# Patient Record
Sex: Female | Born: 1940
Health system: Southern US, Community
[De-identification: ages and names within clinical notes are randomized; demographics above are authoritative.]

## PROBLEM LIST (undated history)

## (undated) DIAGNOSIS — I10 Essential (primary) hypertension: Secondary | ICD-10-CM

## (undated) DIAGNOSIS — B019 Varicella without complication: Secondary | ICD-10-CM

## (undated) DIAGNOSIS — E7800 Pure hypercholesterolemia, unspecified: Secondary | ICD-10-CM

## (undated) DIAGNOSIS — Z8719 Personal history of other diseases of the digestive system: Secondary | ICD-10-CM

## (undated) DIAGNOSIS — K219 Gastro-esophageal reflux disease without esophagitis: Secondary | ICD-10-CM

## (undated) DIAGNOSIS — J3489 Other specified disorders of nose and nasal sinuses: Secondary | ICD-10-CM

## (undated) DIAGNOSIS — E119 Type 2 diabetes mellitus without complications: Secondary | ICD-10-CM

## (undated) DIAGNOSIS — E538 Deficiency of other specified B group vitamins: Secondary | ICD-10-CM

## (undated) HISTORY — DX: Type 2 diabetes mellitus without complications: E11.9

## (undated) HISTORY — DX: Personal history of other diseases of the digestive system: Z87.19

## (undated) HISTORY — DX: Pure hypercholesterolemia, unspecified: E78.00

## (undated) HISTORY — PX: TONSILLECTOMY: SUR1361

## (undated) HISTORY — DX: Varicella without complication: B01.9

## (undated) HISTORY — DX: Deficiency of other specified B group vitamins: E53.8

## (undated) HISTORY — DX: Gastro-esophageal reflux disease without esophagitis: K21.9

## (undated) HISTORY — DX: Essential (primary) hypertension: I10

---

## 1982-07-27 HISTORY — PX: ABDOMINAL HYSTERECTOMY: SHX81

## 1984-07-27 HISTORY — PX: BACK SURGERY: SHX140

## 1989-07-27 HISTORY — PX: CHOLECYSTECTOMY: SHX55

## 1990-07-27 HISTORY — PX: CHOLECYSTECTOMY: SHX55

## 2004-09-15 ENCOUNTER — Ambulatory Visit: Payer: Self-pay | Admitting: Internal Medicine

## 2004-12-08 ENCOUNTER — Ambulatory Visit: Payer: Self-pay

## 2004-12-15 ENCOUNTER — Ambulatory Visit: Payer: Self-pay

## 2005-02-17 ENCOUNTER — Emergency Department: Payer: Self-pay | Admitting: Emergency Medicine

## 2005-08-31 ENCOUNTER — Ambulatory Visit: Payer: Self-pay | Admitting: Gastroenterology

## 2005-09-21 ENCOUNTER — Ambulatory Visit: Payer: Self-pay | Admitting: Gastroenterology

## 2006-04-15 ENCOUNTER — Ambulatory Visit: Payer: Self-pay | Admitting: Internal Medicine

## 2006-11-21 ENCOUNTER — Other Ambulatory Visit: Payer: Self-pay

## 2006-11-21 ENCOUNTER — Emergency Department: Payer: Self-pay | Admitting: Emergency Medicine

## 2009-01-15 ENCOUNTER — Ambulatory Visit: Payer: Self-pay | Admitting: Internal Medicine

## 2010-02-11 ENCOUNTER — Ambulatory Visit: Payer: Self-pay | Admitting: Internal Medicine

## 2010-02-17 ENCOUNTER — Ambulatory Visit: Payer: Self-pay | Admitting: Gastroenterology

## 2010-02-19 LAB — PATHOLOGY REPORT

## 2011-01-02 LAB — HM DEXA SCAN

## 2011-02-17 ENCOUNTER — Ambulatory Visit: Payer: Self-pay | Admitting: Internal Medicine

## 2011-02-19 ENCOUNTER — Ambulatory Visit: Payer: Self-pay | Admitting: Internal Medicine

## 2011-03-19 ENCOUNTER — Ambulatory Visit: Payer: Self-pay | Admitting: Gastroenterology

## 2011-03-19 LAB — HM COLONOSCOPY

## 2011-08-18 DIAGNOSIS — E7800 Pure hypercholesterolemia, unspecified: Secondary | ICD-10-CM | POA: Diagnosis not present

## 2011-08-18 DIAGNOSIS — E119 Type 2 diabetes mellitus without complications: Secondary | ICD-10-CM | POA: Diagnosis not present

## 2011-08-18 DIAGNOSIS — I10 Essential (primary) hypertension: Secondary | ICD-10-CM | POA: Diagnosis not present

## 2011-08-18 DIAGNOSIS — Z23 Encounter for immunization: Secondary | ICD-10-CM | POA: Diagnosis not present

## 2011-08-25 DIAGNOSIS — J209 Acute bronchitis, unspecified: Secondary | ICD-10-CM | POA: Diagnosis not present

## 2011-08-25 DIAGNOSIS — J029 Acute pharyngitis, unspecified: Secondary | ICD-10-CM | POA: Diagnosis not present

## 2011-09-04 ENCOUNTER — Ambulatory Visit: Payer: Self-pay | Admitting: Internal Medicine

## 2011-09-04 DIAGNOSIS — R928 Other abnormal and inconclusive findings on diagnostic imaging of breast: Secondary | ICD-10-CM | POA: Diagnosis not present

## 2011-09-04 DIAGNOSIS — N63 Unspecified lump in unspecified breast: Secondary | ICD-10-CM | POA: Diagnosis not present

## 2011-09-08 DIAGNOSIS — H9209 Otalgia, unspecified ear: Secondary | ICD-10-CM | POA: Diagnosis not present

## 2011-09-08 DIAGNOSIS — R221 Localized swelling, mass and lump, neck: Secondary | ICD-10-CM | POA: Diagnosis not present

## 2011-09-08 DIAGNOSIS — R220 Localized swelling, mass and lump, head: Secondary | ICD-10-CM | POA: Diagnosis not present

## 2011-09-22 DIAGNOSIS — M26609 Unspecified temporomandibular joint disorder, unspecified side: Secondary | ICD-10-CM | POA: Diagnosis not present

## 2011-09-22 DIAGNOSIS — R220 Localized swelling, mass and lump, head: Secondary | ICD-10-CM | POA: Diagnosis not present

## 2011-09-22 DIAGNOSIS — R928 Other abnormal and inconclusive findings on diagnostic imaging of breast: Secondary | ICD-10-CM | POA: Diagnosis not present

## 2011-09-29 ENCOUNTER — Ambulatory Visit: Payer: Self-pay | Admitting: Otolaryngology

## 2011-09-29 DIAGNOSIS — R599 Enlarged lymph nodes, unspecified: Secondary | ICD-10-CM | POA: Diagnosis not present

## 2011-09-29 DIAGNOSIS — R220 Localized swelling, mass and lump, head: Secondary | ICD-10-CM | POA: Diagnosis not present

## 2011-09-29 LAB — CREATININE, SERUM
Creatinine: 0.83 mg/dL (ref 0.60–1.30)
EGFR (African American): >60
EGFR (Non-African Amer.): >60

## 2011-10-01 ENCOUNTER — Other Ambulatory Visit: Payer: Self-pay | Admitting: Otolaryngology

## 2011-10-01 DIAGNOSIS — Z01818 Encounter for other preprocedural examination: Secondary | ICD-10-CM | POA: Diagnosis not present

## 2011-10-01 LAB — CREATININE, SERUM
EGFR (African American): >60
EGFR (Non-African Amer.): >60

## 2011-11-03 DIAGNOSIS — R220 Localized swelling, mass and lump, head: Secondary | ICD-10-CM | POA: Diagnosis not present

## 2011-12-29 DIAGNOSIS — K219 Gastro-esophageal reflux disease without esophagitis: Secondary | ICD-10-CM | POA: Diagnosis not present

## 2012-01-05 DIAGNOSIS — R221 Localized swelling, mass and lump, neck: Secondary | ICD-10-CM | POA: Diagnosis not present

## 2012-01-05 DIAGNOSIS — H6120 Impacted cerumen, unspecified ear: Secondary | ICD-10-CM | POA: Diagnosis not present

## 2012-01-05 DIAGNOSIS — H3530 Unspecified macular degeneration: Secondary | ICD-10-CM | POA: Diagnosis not present

## 2012-01-12 DIAGNOSIS — E119 Type 2 diabetes mellitus without complications: Secondary | ICD-10-CM | POA: Diagnosis not present

## 2012-01-12 DIAGNOSIS — E7800 Pure hypercholesterolemia, unspecified: Secondary | ICD-10-CM | POA: Diagnosis not present

## 2012-01-12 DIAGNOSIS — R5383 Other fatigue: Secondary | ICD-10-CM | POA: Diagnosis not present

## 2012-01-19 DIAGNOSIS — I10 Essential (primary) hypertension: Secondary | ICD-10-CM | POA: Diagnosis not present

## 2012-01-19 DIAGNOSIS — M79609 Pain in unspecified limb: Secondary | ICD-10-CM | POA: Diagnosis not present

## 2012-01-19 DIAGNOSIS — E7800 Pure hypercholesterolemia, unspecified: Secondary | ICD-10-CM | POA: Diagnosis not present

## 2012-01-19 DIAGNOSIS — N9489 Other specified conditions associated with female genital organs and menstrual cycle: Secondary | ICD-10-CM | POA: Diagnosis not present

## 2012-01-19 DIAGNOSIS — M542 Cervicalgia: Secondary | ICD-10-CM | POA: Diagnosis not present

## 2012-01-19 DIAGNOSIS — E119 Type 2 diabetes mellitus without complications: Secondary | ICD-10-CM | POA: Diagnosis not present

## 2012-01-19 DIAGNOSIS — S2249XA Multiple fractures of ribs, unspecified side, initial encounter for closed fracture: Secondary | ICD-10-CM | POA: Diagnosis not present

## 2012-02-23 ENCOUNTER — Ambulatory Visit: Payer: Self-pay | Admitting: Internal Medicine

## 2012-02-23 DIAGNOSIS — N6489 Other specified disorders of breast: Secondary | ICD-10-CM | POA: Diagnosis not present

## 2012-02-23 DIAGNOSIS — E782 Mixed hyperlipidemia: Secondary | ICD-10-CM | POA: Diagnosis not present

## 2012-02-23 DIAGNOSIS — Z1231 Encounter for screening mammogram for malignant neoplasm of breast: Secondary | ICD-10-CM | POA: Diagnosis not present

## 2012-02-23 DIAGNOSIS — E119 Type 2 diabetes mellitus without complications: Secondary | ICD-10-CM | POA: Diagnosis not present

## 2012-02-23 DIAGNOSIS — I10 Essential (primary) hypertension: Secondary | ICD-10-CM | POA: Diagnosis not present

## 2012-02-23 DIAGNOSIS — R922 Inconclusive mammogram: Secondary | ICD-10-CM | POA: Diagnosis not present

## 2012-02-23 DIAGNOSIS — I059 Rheumatic mitral valve disease, unspecified: Secondary | ICD-10-CM | POA: Diagnosis not present

## 2012-02-26 DIAGNOSIS — Z1211 Encounter for screening for malignant neoplasm of colon: Secondary | ICD-10-CM | POA: Diagnosis not present

## 2012-03-22 DIAGNOSIS — M542 Cervicalgia: Secondary | ICD-10-CM | POA: Diagnosis not present

## 2012-03-22 DIAGNOSIS — M25519 Pain in unspecified shoulder: Secondary | ICD-10-CM | POA: Diagnosis not present

## 2012-04-03 ENCOUNTER — Inpatient Hospital Stay: Payer: Self-pay | Admitting: Internal Medicine

## 2012-04-03 DIAGNOSIS — I10 Essential (primary) hypertension: Secondary | ICD-10-CM | POA: Diagnosis not present

## 2012-04-03 DIAGNOSIS — Z87891 Personal history of nicotine dependence: Secondary | ICD-10-CM | POA: Diagnosis not present

## 2012-04-03 DIAGNOSIS — Z881 Allergy status to other antibiotic agents status: Secondary | ICD-10-CM | POA: Diagnosis not present

## 2012-04-03 DIAGNOSIS — K219 Gastro-esophageal reflux disease without esophagitis: Secondary | ICD-10-CM | POA: Diagnosis not present

## 2012-04-03 DIAGNOSIS — I2510 Atherosclerotic heart disease of native coronary artery without angina pectoris: Secondary | ICD-10-CM | POA: Diagnosis present

## 2012-04-03 DIAGNOSIS — I200 Unstable angina: Secondary | ICD-10-CM | POA: Diagnosis not present

## 2012-04-03 DIAGNOSIS — Z823 Family history of stroke: Secondary | ICD-10-CM | POA: Diagnosis not present

## 2012-04-03 DIAGNOSIS — IMO0002 Reserved for concepts with insufficient information to code with codable children: Secondary | ICD-10-CM | POA: Diagnosis present

## 2012-04-03 DIAGNOSIS — R079 Chest pain, unspecified: Secondary | ICD-10-CM | POA: Diagnosis not present

## 2012-04-03 DIAGNOSIS — E785 Hyperlipidemia, unspecified: Secondary | ICD-10-CM | POA: Diagnosis not present

## 2012-04-03 DIAGNOSIS — E538 Deficiency of other specified B group vitamins: Secondary | ICD-10-CM | POA: Diagnosis present

## 2012-04-03 DIAGNOSIS — Z9089 Acquired absence of other organs: Secondary | ICD-10-CM | POA: Diagnosis not present

## 2012-04-03 DIAGNOSIS — Z9079 Acquired absence of other genital organ(s): Secondary | ICD-10-CM | POA: Diagnosis not present

## 2012-04-03 DIAGNOSIS — I209 Angina pectoris, unspecified: Secondary | ICD-10-CM | POA: Diagnosis not present

## 2012-04-03 DIAGNOSIS — R072 Precordial pain: Secondary | ICD-10-CM | POA: Diagnosis not present

## 2012-04-03 DIAGNOSIS — R0789 Other chest pain: Secondary | ICD-10-CM | POA: Diagnosis not present

## 2012-04-03 DIAGNOSIS — E119 Type 2 diabetes mellitus without complications: Secondary | ICD-10-CM | POA: Diagnosis not present

## 2012-04-03 DIAGNOSIS — Z90710 Acquired absence of both cervix and uterus: Secondary | ICD-10-CM | POA: Diagnosis not present

## 2012-04-03 LAB — CK TOTAL AND CKMB (NOT AT ARMC)
CK, Total: 126 U/L (ref 21–215)
CK, Total: 181 U/L (ref 21–215)
CK, Total: 93 U/L (ref 21–215)
CK-MB: 0.8 ng/mL (ref 0.5–3.6)
CK-MB: 0.8 ng/mL (ref 0.5–3.6)

## 2012-04-03 LAB — TROPONIN I
Troponin-I: <0.02 ng/mL
Troponin-I: <0.02 ng/mL
Troponin-I: <0.02 ng/mL

## 2012-04-03 LAB — COMPREHENSIVE METABOLIC PANEL
Alkaline Phosphatase: 92 U/L (ref 50–136)
Bilirubin,Total: 0.3 mg/dL (ref 0.2–1.0)
Calcium, Total: 8.9 mg/dL (ref 8.5–10.1)
Co2: 33 mmol/L — ABNORMAL HIGH (ref 21–32)
EGFR (Non-African Amer.): >60
Glucose: 136 mg/dL — ABNORMAL HIGH (ref 65–99)
Osmolality: 287 (ref 275–301)
SGOT(AST): 23 U/L (ref 15–37)
SGPT (ALT): 23 U/L (ref 12–78)
Sodium: 142 mmol/L (ref 136–145)

## 2012-04-03 LAB — CBC
HCT: 39.4 % (ref 35.0–47.0)
HGB: 12.8 g/dL (ref 12.0–16.0)
MCHC: 32.5 g/dL (ref 32.0–36.0)
Platelet: 340 10*3/uL (ref 150–440)
RBC: 4.36 10*6/uL (ref 3.80–5.20)
WBC: 11.2 10*3/uL — ABNORMAL HIGH (ref 3.6–11.0)

## 2012-04-03 LAB — PROTIME-INR: Prothrombin Time: 12.6 secs (ref 11.5–14.7)

## 2012-04-03 LAB — LIPID PANEL
HDL Cholesterol: 52 mg/dL (ref 40–60)
Triglycerides: 103 mg/dL (ref 0–200)

## 2012-04-03 LAB — APTT: Activated PTT: 28.3 secs (ref 23.6–35.9)

## 2012-04-03 LAB — HEMOGLOBIN A1C: Hemoglobin A1C: 6.5 % — ABNORMAL HIGH (ref 4.2–6.3)

## 2012-04-26 DIAGNOSIS — Z23 Encounter for immunization: Secondary | ICD-10-CM | POA: Diagnosis not present

## 2012-05-31 DIAGNOSIS — Z23 Encounter for immunization: Secondary | ICD-10-CM | POA: Diagnosis not present

## 2012-06-07 ENCOUNTER — Encounter: Payer: Self-pay | Admitting: Internal Medicine

## 2012-06-07 ENCOUNTER — Ambulatory Visit (INDEPENDENT_AMBULATORY_CARE_PROVIDER_SITE_OTHER): Payer: Medicare Other | Admitting: Internal Medicine

## 2012-06-07 VITALS — BP 138/88 | HR 94 | Temp 98.4°F | Ht 63.5 in | Wt 210.5 lb

## 2012-06-07 DIAGNOSIS — I10 Essential (primary) hypertension: Secondary | ICD-10-CM | POA: Diagnosis not present

## 2012-06-07 DIAGNOSIS — R51 Headache: Secondary | ICD-10-CM

## 2012-06-07 DIAGNOSIS — E119 Type 2 diabetes mellitus without complications: Secondary | ICD-10-CM

## 2012-06-07 DIAGNOSIS — E7800 Pure hypercholesterolemia, unspecified: Secondary | ICD-10-CM

## 2012-06-07 DIAGNOSIS — R5383 Other fatigue: Secondary | ICD-10-CM

## 2012-06-07 DIAGNOSIS — R928 Other abnormal and inconclusive findings on diagnostic imaging of breast: Secondary | ICD-10-CM

## 2012-06-07 DIAGNOSIS — K219 Gastro-esophageal reflux disease without esophagitis: Secondary | ICD-10-CM

## 2012-06-07 DIAGNOSIS — R5381 Other malaise: Secondary | ICD-10-CM

## 2012-06-07 LAB — BASIC METABOLIC PANEL
BUN: 21 mg/dL (ref 6–23)
CO2: 31 mEq/L (ref 19–32)
Calcium: 9 mg/dL (ref 8.4–10.5)
Glucose, Bld: 110 mg/dL — ABNORMAL HIGH (ref 70–99)
Potassium: 4.7 mEq/L (ref 3.5–5.1)
Sodium: 139 mEq/L (ref 135–145)

## 2012-06-07 LAB — CBC WITH DIFFERENTIAL/PLATELET
Basophils Relative: 0.5 % (ref 0.0–3.0)
Eosinophils Relative: 1.4 % (ref 0.0–5.0)
Hemoglobin: 13.3 g/dL (ref 12.0–15.0)
Lymphocytes Relative: 34.8 % (ref 12.0–46.0)
Monocytes Relative: 6.2 % (ref 3.0–12.0)
Neutrophils Relative %: 57.1 % (ref 43.0–77.0)
RBC: 4.51 Mil/uL (ref 3.87–5.11)

## 2012-06-07 LAB — SEDIMENTATION RATE: Sed Rate: 10 mm/hr (ref 0–22)

## 2012-06-07 MED ORDER — AZITHROMYCIN 250 MG PO TABS: ORAL_TABLET | ORAL | Status: DC

## 2012-06-07 MED ORDER — FLUTICASONE PROPIONATE 50 MCG/ACT NA SUSP: 2.0000 | Freq: Every day | NASAL | Status: DC

## 2012-06-07 NOTE — Patient Instructions (Addendum)
It was nice seeing you today.  I am sorry you have been having headaches.  I am going to give you a Zpak to take as directed.  Nasal flushes 2-3x/day.  Flonase nasal spray - 2 sprays each nostril in the evening.  Let me know if you continue to have problems.  We will notify you of your labs once they are available.

## 2012-06-08 ENCOUNTER — Other Ambulatory Visit: Payer: Self-pay | Admitting: Internal Medicine

## 2012-06-08 DIAGNOSIS — E7800 Pure hypercholesterolemia, unspecified: Secondary | ICD-10-CM

## 2012-06-08 DIAGNOSIS — E119 Type 2 diabetes mellitus without complications: Secondary | ICD-10-CM

## 2012-06-08 DIAGNOSIS — R5383 Other fatigue: Secondary | ICD-10-CM

## 2012-06-13 ENCOUNTER — Telehealth: Payer: Self-pay | Admitting: *Deleted

## 2012-06-14 ENCOUNTER — Other Ambulatory Visit (INDEPENDENT_AMBULATORY_CARE_PROVIDER_SITE_OTHER): Payer: Medicare Other

## 2012-06-14 DIAGNOSIS — R5381 Other malaise: Secondary | ICD-10-CM | POA: Diagnosis not present

## 2012-06-14 DIAGNOSIS — E119 Type 2 diabetes mellitus without complications: Secondary | ICD-10-CM | POA: Diagnosis not present

## 2012-06-14 DIAGNOSIS — R5383 Other fatigue: Secondary | ICD-10-CM | POA: Diagnosis not present

## 2012-06-14 DIAGNOSIS — E7800 Pure hypercholesterolemia, unspecified: Secondary | ICD-10-CM | POA: Diagnosis not present

## 2012-06-14 LAB — TSH: TSH: 2.43 u[IU]/mL (ref 0.35–5.50)

## 2012-06-14 LAB — LIPID PANEL
HDL: 49.1 mg/dL (ref >39.00)
Total CHOL/HDL Ratio: 5

## 2012-06-14 LAB — LDL CHOLESTEROL, DIRECT: Direct LDL: 169.5 mg/dL

## 2012-06-15 LAB — BASIC METABOLIC PANEL
BUN: 23 mg/dL (ref 6–23)
Calcium: 8.6 mg/dL (ref 8.4–10.5)
Chloride: 102 mEq/L (ref 96–112)
Creatinine, Ser: 0.8 mg/dL (ref 0.4–1.2)
GFR: 71.8 mL/min (ref >60.00)
Glucose, Bld: 118 mg/dL — ABNORMAL HIGH (ref 70–99)
Potassium: 4.5 mEq/L (ref 3.5–5.1)

## 2012-06-16 NOTE — Telephone Encounter (Signed)
Opened in error

## 2012-06-19 ENCOUNTER — Encounter: Payer: Self-pay | Admitting: Internal Medicine

## 2012-06-19 DIAGNOSIS — I10 Essential (primary) hypertension: Secondary | ICD-10-CM | POA: Insufficient documentation

## 2012-06-19 DIAGNOSIS — K219 Gastro-esophageal reflux disease without esophagitis: Secondary | ICD-10-CM | POA: Insufficient documentation

## 2012-06-19 NOTE — Assessment & Plan Note (Signed)
Unable to tolerate statin medications.  Low cholesterol diet and exercise.  Check lipid panel with next fasting labs.

## 2012-06-19 NOTE — Assessment & Plan Note (Signed)
Low carb diet and exercise.  Continue metformin.  Check met b and a1c with next fasting labs.  Keep up to date with eye checks.

## 2012-06-19 NOTE — Assessment & Plan Note (Signed)
Controlled on current regimen.  Follow.  

## 2012-06-19 NOTE — Assessment & Plan Note (Signed)
Blood pressure has been under good control. Same meds.  Follow.   

## 2012-06-19 NOTE — Progress Notes (Signed)
Subjective:    Patient ID: Misty Jones, female    DOB: 04-07-1941, 71 y.o.   MRN: [id number]  HPI 71 year old female with past history of diabetes, hypertension and hypercholesterolemia who comes in today for a scheduled follow up.  She has been noticing some problems with reoccurring fever blisters.  She also has noticed her hands and feet - feel like they are going to sleep.   This mostly occurs when she is sitting.  Right > left.   Notices her hands - when she is sitting and holding a book.  Sugars are doing well.  Am sugars - 100-120 and pm sugars 125-130s.  Was hospitalized 9/13 for elevated blood pressure and chest pain.  Was kept over night.  Stress test negative.  Saw Dr Gwen Pounds.  She has also had some issues with her sinuses.  Saw Dr Chestine Spore.  Using saline nasal rinses.  Having some issues now.  Headache feels similar to her sinus headaches.  Some congestion.  Doing better.    Past Medical History  Diagnosis Date  . Diabetes mellitus   . Hypertension   . Hypercholesterolemia   . GERD (gastroesophageal reflux disease)   . B12 deficiency   . Chicken pox   . History of diverticulitis of colon     Review of Systems Patient denies any lightheadedness or dizziness currently.  Sinus symptoms as outlined.  Some headache.  No chest pain, tightness or palpitations currently.   No increased shortness of breath, cough or congestion.  No acid reflux.  Controlled.  No nausea or vomiting.  No abdominal pain or cramping.  No bowel change, such as diarrhea, constipation, BRBPR or melana.  No urine change.        Objective:   Physical Exam Filed Vitals:   06/07/12 1336  BP: 138/88  Pulse: 94  Temp: 98.4 F (25.71 C)   71 year old female in no acute distress.   HEENT:  Nares - clear except slightly erythematous turbinates.  OP- without lesions or erythema.  TMs visualized - without erythema.  Minimal tenderness to palpation over the sinuses.   NECK:  Supple, nontender.  No audible bruit.     HEART:  Appears to be regular. LUNGS:  Without crackles or wheezing audible.  Respirations even and unlabored.   RADIAL PULSE:  Equal bilaterally.  ABDOMEN:  Soft, nontender.  No audible abdominal bruit.   EXTREMITIES:  No increased edema to be present.                     Assessment & Plan:  HEADACHE.  She feels this is consistent with her sinus headaches.  Will treat with zpak as directed.  Saline nasal flushes and Flonase as directed.  Will check ESR.  Notify me if headache does not resolve completely.    CARDIOVASCULAR.  Currently asymptomatic.  Follow.    HAND NUMBNESS. Worse with holding a book.   Appears to be c/w possible carpal tunnel.  Wrist splint.  Follow.   ABNORMAL MAMMOGRAM.  Mammogram 02/17/11 rec follow up views.  These were done 02/19/11 and revealed a persistent density in the right upper outer quadrant.  Saw Dr Katrinka Blazing.  Had follow up right breast mammo 2/13.  Stable.  Felt to be benign.  Felt biopsy not warranted.   Recommended a follow up bilateral mammogram in 7/13.  Need results of 7/13 mammo.    HEALTH MAINTENANCE.  Physical 01/19/12.  Mammogram as outlined above.  Colonoscopy  03/19/11 revealed diverticulosis otherwise normal.  Recommended repeat in five years.  Bone density 01/13/11 - osteopenia (improved).

## 2012-07-05 DIAGNOSIS — R220 Localized swelling, mass and lump, head: Secondary | ICD-10-CM | POA: Diagnosis not present

## 2012-07-05 DIAGNOSIS — R221 Localized swelling, mass and lump, neck: Secondary | ICD-10-CM | POA: Diagnosis not present

## 2012-07-26 ENCOUNTER — Telehealth: Payer: Self-pay | Admitting: Internal Medicine

## 2012-07-26 NOTE — Telephone Encounter (Signed)
Pt is calling and saying that Delford Field says she needs an order put in for her 6 month recheck of her mammo.

## 2012-07-27 NOTE — Telephone Encounter (Signed)
You have left her a message to schedule a follow up appt for a repeat breast exam.  I will order follow up mammo at that time.  Let me know if she cannot come to appt.  Thanks.

## 2012-07-28 ENCOUNTER — Ambulatory Visit: Payer: BLUE CROSS/BLUE SHIELD | Admitting: Internal Medicine

## 2012-07-28 NOTE — Telephone Encounter (Signed)
Please let me know when you can see Misty Jones   Pt stated she could only come Tuesday and thursdays  And Thursday has to be early she takes care of her grandchildren

## 2012-07-28 NOTE — Telephone Encounter (Signed)
Pt aware of appointment 1/23 @ 9:15

## 2012-07-28 NOTE — Telephone Encounter (Signed)
See if she can come in 08/18/12 at 9:15.  Thanks

## 2012-08-18 ENCOUNTER — Encounter: Payer: Self-pay | Admitting: Internal Medicine

## 2012-08-18 ENCOUNTER — Ambulatory Visit (INDEPENDENT_AMBULATORY_CARE_PROVIDER_SITE_OTHER): Payer: Medicare Other | Admitting: Internal Medicine

## 2012-08-18 VITALS — BP 140/90 | HR 96 | Temp 99.1°F | Ht 63.5 in | Wt 209.5 lb

## 2012-08-18 DIAGNOSIS — E119 Type 2 diabetes mellitus without complications: Secondary | ICD-10-CM

## 2012-08-18 DIAGNOSIS — R928 Other abnormal and inconclusive findings on diagnostic imaging of breast: Secondary | ICD-10-CM

## 2012-08-18 DIAGNOSIS — I10 Essential (primary) hypertension: Secondary | ICD-10-CM | POA: Diagnosis not present

## 2012-08-18 DIAGNOSIS — E7800 Pure hypercholesterolemia, unspecified: Secondary | ICD-10-CM | POA: Diagnosis not present

## 2012-08-18 DIAGNOSIS — K219 Gastro-esophageal reflux disease without esophagitis: Secondary | ICD-10-CM

## 2012-08-18 MED ORDER — DESONIDE 0.05 % EX CREA: TOPICAL_CREAM | CUTANEOUS | Status: DC

## 2012-08-22 ENCOUNTER — Encounter: Payer: Self-pay | Admitting: Internal Medicine

## 2012-08-22 NOTE — Assessment & Plan Note (Signed)
Sugars doing well as outlined.  Follow metabolic panel and a1c.

## 2012-08-22 NOTE — Assessment & Plan Note (Signed)
Symptoms controlled on medication.  Follow.   

## 2012-08-22 NOTE — Assessment & Plan Note (Signed)
Blood pressure under good control.  Same medication regimen.  Follow metabolic panel.   

## 2012-08-22 NOTE — Assessment & Plan Note (Signed)
Low cholesterol diet.  Unable to tolerate statins.  Follow lipid profile.    

## 2012-08-22 NOTE — Progress Notes (Signed)
  Subjective:    Patient ID: Misty Jones, female    DOB: Jul 12, 1941, 73 y.o.   MRN: [id number]  HPI 72 year old female with past history of diabetes, hypertension and hypercholesterolemia who comes in today for a scheduled follow up.  Sugars are doing well.  Am sugars - 119-124 and pm sugars 107-124.  Overall doing well.  Here to follow up on her breast exam.  Recent mammogram - rec six month follow up.  Due for follow up  She has not noticed any change in her breast.  No discharge.  No nodules.  Breathing stable.  No chest pain.  Acid reflux under good control.     Past Medical History  Diagnosis Date  . Diabetes mellitus   . Hypertension   . Hypercholesterolemia   . GERD (gastroesophageal reflux disease)   . B12 deficiency   . Chicken pox   . History of diverticulitis of colon     Review of Systems Patient denies any lightheadedness or dizziness.  No headache.  No chest pain, tightness or palpitations currently.   No increased shortness of breath, cough or congestion.  No acid reflux.  Controlled.  No nausea or vomiting.  No abdominal pain or cramping.  No bowel change, such as diarrhea, constipation, BRBPR or melana.  No urine change.        Objective:   Physical Exam  Filed Vitals:   08/18/12 0916  BP: 140/90  Pulse: 96  Temp: 99.1 F (37.3 C)   Blood pressure recheck:  128/84, pulse 38  72 year old female in no acute distress.   HEENT:  Nares- clear.  Oropharynx - without lesions. NECK:  Supple.  Nontender.  No audible bruit.  HEART:  Appears to be regular. LUNGS:  No crackles or wheezing audible.  Respirations even and unlabored.  RADIAL PULSE:  Equal bilaterally.    BREASTS:  No nipple discharge or nipple retraction present.  Could not appreciate any distinct nodules or axillary adenopathy.  ABDOMEN:  Soft, nontender.  Bowel sounds present and normal.  No audible abdominal bruit.   EXTREMITIES:  No increased edema present.  DP pulses palpable and equal bilaterally.              Assessment & Plan:   CARDIOVASCULAR.  Currently asymptomatic.  Follow.     ABNORMAL MAMMOGRAM.  Mammogram 02/17/11 rec follow up views.  These were done 02/19/11 and revealed a persistent density in the right upper outer quadrant.  Saw Dr Katrinka Blazing.  Had follow up right breast mammo 2/13.  Stable.  Felt to be benign.  Felt biopsy not warranted.   Recommended a follow up bilateral mammogram in 7/13.  Follow up mammogram 01/2012 recommended six month follow up.  Due now.  Will schedule.    HEALTH MAINTENANCE.  Physical 01/19/12.  Mammogram as outlined above.  Colonoscopy 03/19/11 revealed diverticulosis otherwise normal.  Recommended repeat in five years.  Bone density 01/13/11 - osteopenia (improved).

## 2012-09-06 ENCOUNTER — Ambulatory Visit: Payer: Self-pay | Admitting: Internal Medicine

## 2012-09-06 ENCOUNTER — Ambulatory Visit: Payer: BLUE CROSS/BLUE SHIELD | Admitting: Internal Medicine

## 2012-09-06 DIAGNOSIS — R928 Other abnormal and inconclusive findings on diagnostic imaging of breast: Secondary | ICD-10-CM | POA: Diagnosis not present

## 2012-09-12 ENCOUNTER — Other Ambulatory Visit: Payer: Self-pay | Admitting: Internal Medicine

## 2012-09-13 DIAGNOSIS — R928 Other abnormal and inconclusive findings on diagnostic imaging of breast: Secondary | ICD-10-CM | POA: Insufficient documentation

## 2012-09-26 ENCOUNTER — Encounter: Payer: Self-pay | Admitting: Internal Medicine

## 2012-10-18 ENCOUNTER — Ambulatory Visit: Payer: Medicare Other | Admitting: Internal Medicine

## 2012-10-25 ENCOUNTER — Encounter: Payer: Self-pay | Admitting: Internal Medicine

## 2012-10-25 ENCOUNTER — Ambulatory Visit (INDEPENDENT_AMBULATORY_CARE_PROVIDER_SITE_OTHER): Payer: Medicare Other | Admitting: Internal Medicine

## 2012-10-25 VITALS — BP 120/70 | HR 99 | Temp 98.7°F | Ht 63.5 in | Wt 213.2 lb

## 2012-10-25 DIAGNOSIS — E119 Type 2 diabetes mellitus without complications: Secondary | ICD-10-CM | POA: Diagnosis not present

## 2012-10-25 DIAGNOSIS — M79672 Pain in left foot: Secondary | ICD-10-CM

## 2012-10-25 DIAGNOSIS — K219 Gastro-esophageal reflux disease without esophagitis: Secondary | ICD-10-CM

## 2012-10-25 DIAGNOSIS — E7800 Pure hypercholesterolemia, unspecified: Secondary | ICD-10-CM

## 2012-10-25 DIAGNOSIS — M79609 Pain in unspecified limb: Secondary | ICD-10-CM | POA: Diagnosis not present

## 2012-10-25 DIAGNOSIS — R928 Other abnormal and inconclusive findings on diagnostic imaging of breast: Secondary | ICD-10-CM | POA: Diagnosis not present

## 2012-10-25 DIAGNOSIS — I10 Essential (primary) hypertension: Secondary | ICD-10-CM

## 2012-10-30 ENCOUNTER — Encounter: Payer: Self-pay | Admitting: Internal Medicine

## 2012-10-30 NOTE — Progress Notes (Signed)
Subjective:    Patient ID: Misty Jones, female    DOB: [birth year], 72 y.o.   MRN: [id number]  HPI 72 year old female with past history of diabetes, hypertension and hypercholesterolemia who comes in today for a scheduled follow up.  Sugars are doing well.  Am sugars - 20s and pm sugars 110-120.  Overall doing well.  Breathing stable.  No chest pain.  Acid reflux under good control.  Blood pressure is doing well.  She is still having increased foot pain.  Has a history of foot fracture.  Saw Dr Kennith Center.  Request referral back to Dr Kennith Center given persistent pain.      Past Medical History  Diagnosis Date  . Diabetes mellitus   . Hypertension   . Hypercholesterolemia   . GERD (gastroesophageal reflux disease)   . B12 deficiency   . Chicken pox   . History of diverticulitis of colon      Current Outpatient Prescriptions on File Prior to Visit  Medication Sig Dispense Refill  . aspirin 81 MG tablet Take 81 mg by mouth daily.      . benazepril (LOTENSIN) 40 MG tablet TAKE ONE TABLET BY MOUTH EVERY DAY  90 tablet  1  . beta carotene w/minerals (OCUVITE) tablet Take 1 tablet by mouth daily.      . Calcium Carbonate-Vitamin D (CALCIUM 600+D) 600-400 MG-UNIT per tablet Take 1 tablet by mouth 2 (two) times daily.      . cyanocobalamin (,VITAMIN B-12,) 1000 MCG/ML injection Inject 1,000 mcg into the muscle every 30 (thirty) days.      Marland Kitchen desonide (DESOWEN) 0.05 % cream As directed  60 g  0  . fish oil-omega-3 fatty acids 1000 MG capsule Take 1 g by mouth 2 (two) times daily.      . fluticasone (FLONASE) 50 MCG/ACT nasal spray Place 2 sprays into the nose daily.  16 g  0  . hydrochlorothiazide (HYDRODIURIL) 25 MG tablet Take 25 mg by mouth daily.      . metFORMIN (GLUCOPHAGE) 500 MG tablet TAKE ONE TABLET BY MOUTH EVERY DAY  90 tablet  1  . omeprazole (PRILOSEC OTC) 20 MG tablet Take 20 mg by mouth daily.      . ranitidine (ZANTAC) 150 MG tablet Take 150 mg by mouth as needed.       Marland Kitchen azithromycin  (ZITHROMAX) 250 MG tablet Take 2 tabs at the same time x 1 day and then one tablet per day for four more days.  6 tablet  0  . Cholecalciferol (VITAMIN D) 2000 UNITS tablet Take 2,000 Units by mouth daily.       No current facility-administered medications on file prior to visit.    Review of Systems Patient denies any lightheadedness or dizziness.  No headache.  No chest pain, tightness or palpitations currently.   No increased shortness of breath, cough or congestion.  No acid reflux.  Controlled.  No nausea or vomiting.  No abdominal pain or cramping.  No bowel change, such as diarrhea, constipation, BRBPR or melana.  No urine change.  Persistent left foot pain.  S/p fracture.       Objective:   Physical Exam  Filed Vitals:   10/25/12 1533  BP: 120/70  Pulse: 99  Temp: 98.7 F (37.1 C)   Blood pressure recheck:  124/78, pulse 62  72 year old female in no acute distress.   HEENT:  Nares- clear.  Oropharynx - without lesions. NECK:  Supple.  Nontender.  No audible bruit.  HEART:  Appears to be regular. LUNGS:  No crackles or wheezing audible.  Respirations even and unlabored.  RADIAL PULSE:  Equal bilaterally. ABDOMEN:  Soft, nontender.  Bowel sounds present and normal.  No audible abdominal bruit.   EXTREMITIES:  No increased edema present.  DP pulses palpable and equal bilaterally.             Assessment & Plan:  CARDIOVASCULAR.  Currently asymptomatic.  Follow.   PERSISTENT FOOT PAIN.  S/p foot fracture.  Request referral back to Dr Kennith Center.     ABNORMAL MAMMOGRAM.  Mammogram 02/17/11 rec follow up views.  These were done 02/19/11 and revealed a persistent density in the right upper outer quadrant.  Saw Dr Katrinka Blazing.  Had follow up right breast mammo 2/13.  Stable.  Felt to be benign.  Felt biopsy not warranted.   Recommended a follow up bilateral mammogram in 7/13.  Follow up mammogram 01/2012 recommended six month follow up. Follow up mammogram 2/14 - Birads II.  Due bilateral mammo  7-8/14.     HEALTH MAINTENANCE.  Physical 01/19/12.  Mammogram as outlined above.  Colonoscopy 03/19/11 revealed diverticulosis otherwise normal.  Recommended repeat in five years.  Bone density 01/13/11 - osteopenia (improved).

## 2012-10-31 NOTE — Assessment & Plan Note (Signed)
Sugars doing well as outlined.  Follow metabolic panel and a1c.

## 2012-10-31 NOTE — Assessment & Plan Note (Signed)
Symptoms controlled on medication.  Follow.   

## 2012-10-31 NOTE — Assessment & Plan Note (Signed)
Low cholesterol diet.  Unable to tolerate statins.  Follow lipid profile.    

## 2012-10-31 NOTE — Assessment & Plan Note (Signed)
Follow up mammo 2/14 - Birads II.  Due follow up mammo 7-8/14.

## 2012-10-31 NOTE — Assessment & Plan Note (Signed)
Blood pressure under good control.  Same medication regimen.  Follow metabolic panel.   

## 2012-11-08 DIAGNOSIS — M19079 Primary osteoarthritis, unspecified ankle and foot: Secondary | ICD-10-CM | POA: Diagnosis not present

## 2012-11-24 ENCOUNTER — Other Ambulatory Visit: Payer: Self-pay | Admitting: *Deleted

## 2012-11-24 ENCOUNTER — Telehealth: Payer: Self-pay | Admitting: *Deleted

## 2012-11-24 MED ORDER — GLUCOSE BLOOD VI STRP: ORAL_STRIP | Status: DC

## 2012-11-24 NOTE — Telephone Encounter (Signed)
Refill Request  Contour Blood Glucose Test strip  #90  Use one strip to check glucose every day   PHARMACY NOTE:  Please re-fax Rx with diagnosis code

## 2012-11-24 NOTE — Telephone Encounter (Signed)
Spoke with pharmacist & corrected RX for contour glucose test strip & added DX

## 2012-11-24 NOTE — Telephone Encounter (Signed)
PHARMACY NOTE:  What kind of Glucose blood strip test?

## 2012-11-28 ENCOUNTER — Other Ambulatory Visit: Payer: Self-pay | Admitting: Internal Medicine

## 2012-11-29 ENCOUNTER — Telehealth: Payer: Self-pay | Admitting: Internal Medicine

## 2012-11-29 ENCOUNTER — Other Ambulatory Visit: Payer: Self-pay | Admitting: *Deleted

## 2012-11-29 MED ORDER — GLUCOSE BLOOD VI STRP: ORAL_STRIP | Status: DC

## 2012-11-29 NOTE — Telephone Encounter (Signed)
Spoke with pharmacist & resent RX with specific instuctions, & diagnosis. Pt informed also

## 2012-11-29 NOTE — Telephone Encounter (Signed)
Pharmacist told pt Dr. Lorin Picket would have to send in updated info including diagnosis of the pt in order to refill her strips.  Pt states pharmacy was needing several pieces of information but is unaware what all the need to know.  [facility] [address]

## 2012-11-29 NOTE — Telephone Encounter (Signed)
Pharmacy Note:    Please send new Rx via fax or escribe with specific instruction on how many times to test per day.   Please also include dx code

## 2013-01-19 DIAGNOSIS — E119 Type 2 diabetes mellitus without complications: Secondary | ICD-10-CM | POA: Diagnosis not present

## 2013-01-19 LAB — HM DIABETES EYE EXAM

## 2013-01-24 ENCOUNTER — Encounter: Payer: Self-pay | Admitting: Internal Medicine

## 2013-01-24 ENCOUNTER — Ambulatory Visit (INDEPENDENT_AMBULATORY_CARE_PROVIDER_SITE_OTHER): Payer: Medicare Other | Admitting: Internal Medicine

## 2013-01-24 VITALS — BP 130/80 | HR 75 | Temp 98.1°F | Ht 64.0 in | Wt 210.0 lb

## 2013-01-24 DIAGNOSIS — R928 Other abnormal and inconclusive findings on diagnostic imaging of breast: Secondary | ICD-10-CM

## 2013-01-24 DIAGNOSIS — M8580 Other specified disorders of bone density and structure, unspecified site: Secondary | ICD-10-CM

## 2013-01-24 DIAGNOSIS — M949 Disorder of cartilage, unspecified: Secondary | ICD-10-CM | POA: Diagnosis not present

## 2013-01-24 DIAGNOSIS — E119 Type 2 diabetes mellitus without complications: Secondary | ICD-10-CM | POA: Diagnosis not present

## 2013-01-24 DIAGNOSIS — G629 Polyneuropathy, unspecified: Secondary | ICD-10-CM

## 2013-01-24 DIAGNOSIS — G609 Hereditary and idiopathic neuropathy, unspecified: Secondary | ICD-10-CM

## 2013-01-24 DIAGNOSIS — M899 Disorder of bone, unspecified: Secondary | ICD-10-CM | POA: Diagnosis not present

## 2013-01-24 DIAGNOSIS — E7800 Pure hypercholesterolemia, unspecified: Secondary | ICD-10-CM

## 2013-01-24 DIAGNOSIS — K219 Gastro-esophageal reflux disease without esophagitis: Secondary | ICD-10-CM

## 2013-01-24 DIAGNOSIS — I10 Essential (primary) hypertension: Secondary | ICD-10-CM

## 2013-01-24 DIAGNOSIS — K625 Hemorrhage of anus and rectum: Secondary | ICD-10-CM | POA: Diagnosis not present

## 2013-01-24 LAB — CBC WITH DIFFERENTIAL/PLATELET
Basophils Relative: 0.5 % (ref 0.0–3.0)
Eosinophils Relative: 1.6 % (ref 0.0–5.0)
HCT: 39.9 % (ref 36.0–46.0)
Hemoglobin: 13.1 g/dL (ref 12.0–15.0)
Lymphs Abs: 2.3 10*3/uL (ref 0.7–4.0)
MCHC: 32.8 g/dL (ref 30.0–36.0)
MCV: 91.8 fl (ref 78.0–100.0)
Monocytes Absolute: 0.4 10*3/uL (ref 0.1–1.0)
Platelets: 288 10*3/uL (ref 150.0–400.0)
RBC: 4.34 Mil/uL (ref 3.87–5.11)
WBC: 6.7 10*3/uL (ref 4.5–10.5)

## 2013-01-24 LAB — HEMOGLOBIN A1C: Hgb A1c MFr Bld: 7.2 % — ABNORMAL HIGH (ref 4.6–6.5)

## 2013-01-24 LAB — BASIC METABOLIC PANEL
Calcium: 9.6 mg/dL (ref 8.4–10.5)
Creatinine, Ser: 0.9 mg/dL (ref 0.4–1.2)
Potassium: 4.1 mEq/L (ref 3.5–5.1)

## 2013-01-24 LAB — LIPID PANEL
Cholesterol: 262 mg/dL — ABNORMAL HIGH (ref 0–200)
Total CHOL/HDL Ratio: 5

## 2013-01-24 LAB — VITAMIN B12: Vitamin B-12: 650 pg/mL (ref 211–911)

## 2013-01-24 LAB — HM PAP SMEAR

## 2013-01-24 LAB — LDL CHOLESTEROL, DIRECT: Direct LDL: 183.8 mg/dL

## 2013-01-25 ENCOUNTER — Encounter: Payer: Self-pay | Admitting: Internal Medicine

## 2013-01-25 ENCOUNTER — Encounter: Payer: Self-pay | Admitting: *Deleted

## 2013-01-25 DIAGNOSIS — M8580 Other specified disorders of bone density and structure, unspecified site: Secondary | ICD-10-CM | POA: Insufficient documentation

## 2013-01-25 NOTE — Assessment & Plan Note (Signed)
Blood pressure under good control.  Same medication regimen.  Follow metabolic panel.   

## 2013-01-25 NOTE — Assessment & Plan Note (Signed)
Low cholesterol diet.  Unable to tolerate statins.  Follow lipid profile.    

## 2013-01-25 NOTE — Assessment & Plan Note (Signed)
Symptoms controlled on medication.  Follow.   

## 2013-01-25 NOTE — Progress Notes (Signed)
Subjective:    Patient ID: Misty Jones, female    DOB: 11-27-1940, 72 y.o.   MRN: [id number]  HPI 72 year old female with past history of diabetes, hypertension and hypercholesterolemia who comes in today to follow up on these issues as well as for a complete physical exam.  Has not been checking her sugars regularly.  Ran out of strips.  States that recently sugars are running 120s in the am and pm sugars 110-120.  Overall doing well.  Breathing stable.  No chest pain.  Acid reflux under good control.  Blood pressure is doing well.  Just had her eyes checked 01/19/13.        Past Medical History  Diagnosis Date  . Diabetes mellitus   . Hypertension   . Hypercholesterolemia   . GERD (gastroesophageal reflux disease)   . B12 deficiency   . Chicken pox   . History of diverticulitis of colon     Current Outpatient Prescriptions on File Prior to Visit  Medication Sig Dispense Refill  . aspirin 81 MG tablet Take 81 mg by mouth daily.      . benazepril (LOTENSIN) 40 MG tablet TAKE ONE TABLET BY MOUTH EVERY DAY  90 tablet  1  . beta carotene w/minerals (OCUVITE) tablet Take 1 tablet by mouth daily.      . Calcium Carbonate-Vitamin D (CALCIUM 600+D) 600-400 MG-UNIT per tablet Take 1 tablet by mouth 2 (two) times daily.      . Cholecalciferol (VITAMIN D) 2000 UNITS tablet Take 2,000 Units by mouth daily.      . cyanocobalamin (,VITAMIN B-12,) 1000 MCG/ML injection Inject 1,000 mcg into the muscle every 30 (thirty) days.      Marland Kitchen desonide (DESOWEN) 0.05 % cream As directed  60 g  0  . fish oil-omega-3 fatty acids 1000 MG capsule Take 1 g by mouth 2 (two) times daily.      Marland Kitchen glucose blood (BAYER CONTOUR TEST) test strip Use one strip daily  100 each  12  . hydrochlorothiazide (HYDRODIURIL) 25 MG tablet TAKE ONE-HALF TABLET BY MOUTH EVERY DAY  90 tablet  1  . metFORMIN (GLUCOPHAGE) 500 MG tablet TAKE ONE TABLET BY MOUTH EVERY DAY  90 tablet  1  . omeprazole (PRILOSEC OTC) 20 MG tablet Take 20 mg by  mouth daily.       No current facility-administered medications on file prior to visit.    Review of Systems Patient denies any lightheadedness or dizziness.  No headache.  No chest pain, tightness or palpitations.  No increased shortness of breath, cough or congestion.  No acid reflux.  Controlled.  No nausea or vomiting.  No abdominal pain or cramping.  No bowel change, such as diarrhea, constipation, BRBPR or melana.  No urine change.  Sugars as outlined.      Objective:   Physical Exam  Filed Vitals:   01/24/13 0853  BP: 130/80  Pulse: 75  Temp: 98.1 F (36.7 C)   Pulse recheck;  80  72 year old female in no acute distress.   HEENT:  Nares- clear.  Oropharynx - without lesions. NECK:  Supple.  Nontender.  No audible bruit.  HEART:  Appears to be regular. LUNGS:  No crackles or wheezing audible.  Respirations even and unlabored.  RADIAL PULSE:  Equal bilaterally.    BREASTS:  No nipple discharge or nipple retraction present.  Could not appreciate any distinct nodules or axillary adenopathy.  ABDOMEN:  Soft, nontender.  Bowel  sounds present and normal.  No audible abdominal bruit.  GU:  She declined.   RECTAL:  She declined.    EXTREMITIES:  No increased edema present.  DP pulses palpable and equal bilaterally.            Assessment & Plan:  CARDIOVASCULAR.  Currently asymptomatic.  Follow.   FOOT/FINGER NUMBNESS.  Not a significant issue for her.  Mentioned at the end of the visit.  Will check B12.  Keep sugars under good control.   ABNORMAL MAMMOGRAM.  Mammogram 02/17/11 rec follow up views.  These were done 02/19/11 and revealed a persistent density in the right upper outer quadrant.  Saw Dr Katrinka Blazing.  Had follow up right breast mammo 2/13.  Stable.  Felt to be benign.  Felt biopsy not warranted.   Recommended a follow up bilateral mammogram in 7/13.  Follow up mammogram 01/2012 recommended six month follow up. Follow up mammogram 2/14 - Birads II.  Due bilateral mammo 7-8/14.   Schedule.     HEALTH MAINTENANCE.  Physical today.  Mammogram as outlined above.  Colonoscopy 03/19/11 revealed diverticulosis otherwise normal.  Recommended repeat in five years.  Bone density 01/13/11 - osteopenia (improved).

## 2013-01-25 NOTE — Assessment & Plan Note (Signed)
Schedule a follow up mammogram.

## 2013-01-25 NOTE — Assessment & Plan Note (Signed)
Sugars as outlined.  Check metabolic panel and a1c.  Up to date with eye exams.  Just had evaluation 01/19/13.

## 2013-01-25 NOTE — Assessment & Plan Note (Signed)
Bone density as outlined.  Improved 6/12.  Check vitamin D level.

## 2013-02-21 ENCOUNTER — Ambulatory Visit (INDEPENDENT_AMBULATORY_CARE_PROVIDER_SITE_OTHER): Payer: Medicare Other | Admitting: Internal Medicine

## 2013-02-21 ENCOUNTER — Encounter: Payer: Self-pay | Admitting: Internal Medicine

## 2013-02-21 VITALS — BP 102/80 | HR 89 | Temp 98.2°F | Ht 63.5 in | Wt 206.8 lb

## 2013-02-21 DIAGNOSIS — K219 Gastro-esophageal reflux disease without esophagitis: Secondary | ICD-10-CM

## 2013-02-21 DIAGNOSIS — M8580 Other specified disorders of bone density and structure, unspecified site: Secondary | ICD-10-CM

## 2013-02-21 DIAGNOSIS — E7800 Pure hypercholesterolemia, unspecified: Secondary | ICD-10-CM

## 2013-02-21 DIAGNOSIS — M899 Disorder of bone, unspecified: Secondary | ICD-10-CM

## 2013-02-21 DIAGNOSIS — I10 Essential (primary) hypertension: Secondary | ICD-10-CM

## 2013-02-21 DIAGNOSIS — E119 Type 2 diabetes mellitus without complications: Secondary | ICD-10-CM

## 2013-02-21 DIAGNOSIS — R928 Other abnormal and inconclusive findings on diagnostic imaging of breast: Secondary | ICD-10-CM

## 2013-02-21 LAB — HM DIABETES FOOT EXAM

## 2013-02-25 ENCOUNTER — Encounter: Payer: Self-pay | Admitting: Internal Medicine

## 2013-02-25 NOTE — Assessment & Plan Note (Signed)
Sugars attached.  Better.   Up to date with eye exams.  Just had evaluation 01/19/13.  Will continue current medication regimen.  Hold on additional medication since improving.  Has adjusted her diet.  Follow.

## 2013-02-25 NOTE — Assessment & Plan Note (Signed)
Symptoms controlled on medication.  Follow.   

## 2013-02-25 NOTE — Progress Notes (Signed)
Subjective:    Patient ID: Misty Jones, female    DOB: [birth year], 72 y.o.   MRN: [id number]  HPI 72 year old female with past history of diabetes, hypertension and hypercholesterolemia who comes in today for a scheduled follow up.    Overall doing well.  Breathing stable.  No chest pain.  Acid reflux under good control.  Blood pressure is doing well.  Just had her eyes checked 01/19/13.   Had not been checking her sugars regularly.  Was notified a1c elevated and now has been checking her sugars regularly.  Sugars attached.  Much improved.  Has adjusted her diet and is staying active.  Breathing stable.  No acid reflux.  Bowels stable.       Past Medical History  Diagnosis Date  . Diabetes mellitus   . Hypertension   . Hypercholesterolemia   . GERD (gastroesophageal reflux disease)   . B12 deficiency   . Chicken pox   . History of diverticulitis of colon     Current Outpatient Prescriptions on File Prior to Visit  Medication Sig Dispense Refill  . aspirin 81 MG tablet Take 81 mg by mouth daily.      . benazepril (LOTENSIN) 40 MG tablet TAKE ONE TABLET BY MOUTH EVERY DAY  90 tablet  1  . beta carotene w/minerals (OCUVITE) tablet Take 1 tablet by mouth daily.      . Calcium Carbonate-Vitamin D (CALCIUM 600+D) 600-400 MG-UNIT per tablet Take 1 tablet by mouth 2 (two) times daily.      . Cholecalciferol (VITAMIN D) 2000 UNITS tablet Take 2,000 Units by mouth daily.      . cyanocobalamin (,VITAMIN B-12,) 1000 MCG/ML injection Inject 1,000 mcg into the muscle every 30 (thirty) days.      Marland Kitchen desonide (DESOWEN) 0.05 % cream As directed  60 g  0  . fish oil-omega-3 fatty acids 1000 MG capsule Take 1 g by mouth 2 (two) times daily.      Marland Kitchen glucose blood (BAYER CONTOUR TEST) test strip Use one strip daily  100 each  12  . hydrochlorothiazide (HYDRODIURIL) 25 MG tablet TAKE ONE-HALF TABLET BY MOUTH EVERY DAY  90 tablet  1  . metFORMIN (GLUCOPHAGE) 500 MG tablet TAKE ONE TABLET BY MOUTH EVERY DAY  90  tablet  1  . omeprazole (PRILOSEC OTC) 20 MG tablet Take 20 mg by mouth daily.       No current facility-administered medications on file prior to visit.    Review of Systems Patient denies any lightheadedness or dizziness.  No headache.  No chest pain, tightness or palpitations.  No increased shortness of breath, cough or congestion.  No acid reflux.  Controlled.  No nausea or vomiting.  No abdominal pain or cramping.  No bowel change, such as diarrhea, constipation, BRBPR or melana.  No urine change.  Sugars as outlined.  Adjusted her diet.  Staying active.  Sugars better.       Objective:   Physical Exam  Filed Vitals:   02/21/13 1527  BP: 102/80  Pulse: 89  Temp: 98.2 F (36.8 C)   Blood pressure recheck:  63/58  72 year old female in no acute distress.   HEENT:  Nares- clear.  Oropharynx - without lesions. NECK:  Supple.  Nontender.  No audible bruit.  HEART:  Appears to be regular. LUNGS:  No crackles or wheezing audible.  Respirations even and unlabored.  RADIAL PULSE:  Equal bilaterally.  ABDOMEN:  Soft, nontender.  Bowel sounds present and normal.  No audible abdominal bruit.     EXTREMITIES:  No increased edema present.  DP pulses palpable and equal bilaterally.  FEET:  No lesions.             Assessment & Plan:  CARDIOVASCULAR.  Currently asymptomatic.  Follow.  ABNORMAL MAMMOGRAM.  Mammogram 02/17/11 rec follow up views.  These were done 02/19/11 and revealed a persistent density in the right upper outer quadrant.  Saw Dr Katrinka Blazing.  Had follow up right breast mammo 2/13.  Stable.  Felt to be benign.  Felt biopsy not warranted.   Recommended a follow up bilateral mammogram in 7/13.  Follow up mammogram 01/2012 recommended six month follow up. Follow up mammogram 2/14 - Birads II.  Due bilateral mammo 7-8/14.  Scheduled.    HEALTH MAINTENANCE.  Physical last visit.   Mammogram as outlined above.  Colonoscopy 03/19/11 revealed diverticulosis otherwise normal.  Recommended  repeat in five years.  Bone density 01/13/11 - osteopenia (improved).

## 2013-02-25 NOTE — Assessment & Plan Note (Signed)
Follow up mammogram scheduled.

## 2013-02-25 NOTE — Assessment & Plan Note (Signed)
Low cholesterol diet.  Unable to tolerate statins.  Follow lipid profile.    

## 2013-02-25 NOTE — Assessment & Plan Note (Signed)
Bone density as outlined.  Improved 6/12.    

## 2013-02-25 NOTE — Assessment & Plan Note (Signed)
Blood pressure under good control.  Same medication regimen.  Follow metabolic panel.   

## 2013-03-07 ENCOUNTER — Other Ambulatory Visit: Payer: Self-pay | Admitting: Internal Medicine

## 2013-03-07 ENCOUNTER — Ambulatory Visit: Payer: Self-pay | Admitting: Internal Medicine

## 2013-03-07 ENCOUNTER — Encounter: Payer: Self-pay | Admitting: Internal Medicine

## 2013-03-07 DIAGNOSIS — Z1231 Encounter for screening mammogram for malignant neoplasm of breast: Secondary | ICD-10-CM | POA: Diagnosis not present

## 2013-03-09 ENCOUNTER — Encounter: Payer: Self-pay | Admitting: *Deleted

## 2013-03-21 DIAGNOSIS — E782 Mixed hyperlipidemia: Secondary | ICD-10-CM | POA: Diagnosis not present

## 2013-03-21 DIAGNOSIS — I119 Hypertensive heart disease without heart failure: Secondary | ICD-10-CM | POA: Diagnosis not present

## 2013-03-21 DIAGNOSIS — E119 Type 2 diabetes mellitus without complications: Secondary | ICD-10-CM | POA: Diagnosis not present

## 2013-03-21 DIAGNOSIS — I6529 Occlusion and stenosis of unspecified carotid artery: Secondary | ICD-10-CM | POA: Diagnosis not present

## 2013-03-22 ENCOUNTER — Other Ambulatory Visit: Payer: Self-pay | Admitting: Internal Medicine

## 2013-05-03 DIAGNOSIS — Z23 Encounter for immunization: Secondary | ICD-10-CM | POA: Diagnosis not present

## 2013-05-26 ENCOUNTER — Other Ambulatory Visit (INDEPENDENT_AMBULATORY_CARE_PROVIDER_SITE_OTHER): Payer: Medicare Other

## 2013-05-26 DIAGNOSIS — E119 Type 2 diabetes mellitus without complications: Secondary | ICD-10-CM | POA: Diagnosis not present

## 2013-05-26 DIAGNOSIS — E7800 Pure hypercholesterolemia, unspecified: Secondary | ICD-10-CM | POA: Diagnosis not present

## 2013-05-26 LAB — LIPID PANEL: VLDL: 33.8 mg/dL (ref 0.0–40.0)

## 2013-05-26 LAB — COMPREHENSIVE METABOLIC PANEL
Albumin: 3.6 g/dL (ref 3.5–5.2)
Calcium: 9.2 mg/dL (ref 8.4–10.5)
Creatinine, Ser: 0.9 mg/dL (ref 0.4–1.2)
GFR: 66.93 mL/min (ref >60.00)
Glucose, Bld: 137 mg/dL — ABNORMAL HIGH (ref 70–99)
Sodium: 138 mEq/L (ref 135–145)
Total Bilirubin: 0.7 mg/dL (ref 0.3–1.2)
Total Protein: 6.3 g/dL (ref 6.0–8.3)

## 2013-05-26 LAB — LDL CHOLESTEROL, DIRECT: Direct LDL: 171.2 mg/dL

## 2013-05-26 LAB — MICROALBUMIN / CREATININE URINE RATIO: Microalb, Ur: 1.2 mg/dL (ref 0.0–1.9)

## 2013-05-30 ENCOUNTER — Encounter: Payer: Self-pay | Admitting: Internal Medicine

## 2013-05-30 ENCOUNTER — Ambulatory Visit (INDEPENDENT_AMBULATORY_CARE_PROVIDER_SITE_OTHER): Payer: Medicare Other | Admitting: Internal Medicine

## 2013-05-30 VITALS — BP 118/80 | HR 94 | Temp 98.0°F | Ht 63.5 in | Wt 204.5 lb

## 2013-05-30 DIAGNOSIS — E119 Type 2 diabetes mellitus without complications: Secondary | ICD-10-CM

## 2013-05-30 DIAGNOSIS — M79609 Pain in unspecified limb: Secondary | ICD-10-CM | POA: Diagnosis not present

## 2013-05-30 DIAGNOSIS — M8580 Other specified disorders of bone density and structure, unspecified site: Secondary | ICD-10-CM

## 2013-05-30 DIAGNOSIS — M25512 Pain in left shoulder: Secondary | ICD-10-CM

## 2013-05-30 DIAGNOSIS — I10 Essential (primary) hypertension: Secondary | ICD-10-CM

## 2013-05-30 DIAGNOSIS — E7800 Pure hypercholesterolemia, unspecified: Secondary | ICD-10-CM

## 2013-05-30 DIAGNOSIS — Z23 Encounter for immunization: Secondary | ICD-10-CM | POA: Diagnosis not present

## 2013-05-30 DIAGNOSIS — M79671 Pain in right foot: Secondary | ICD-10-CM

## 2013-05-30 DIAGNOSIS — M899 Disorder of bone, unspecified: Secondary | ICD-10-CM | POA: Diagnosis not present

## 2013-05-30 DIAGNOSIS — M25519 Pain in unspecified shoulder: Secondary | ICD-10-CM

## 2013-05-30 DIAGNOSIS — R928 Other abnormal and inconclusive findings on diagnostic imaging of breast: Secondary | ICD-10-CM

## 2013-05-30 DIAGNOSIS — K219 Gastro-esophageal reflux disease without esophagitis: Secondary | ICD-10-CM

## 2013-05-30 NOTE — Progress Notes (Signed)
Pre-visit discussion using our clinic review tool. No additional management support is needed unless otherwise documented below in the visit note.  

## 2013-05-30 NOTE — Progress Notes (Signed)
Subjective:    Patient ID: Misty Jones, female    DOB: 1941-06-14, 72 y.o.   MRN: [id number]  HPI 72 year old female with past history of diabetes, hypertension and hypercholesterolemia who comes in today for a scheduled follow up.    Overall doing well.  Breathing stable.  No chest pain.  Acid reflux under good control.  Blood pressure is doing well.  Just had her eyes checked 01/19/13.  A1c just checked - 7.1.   Is staying active.  Breathing stable.  No acid reflux.  Bowels stable.  Is having some pain in her right foot.  Does limit her walking.  No sob.  No chest pain or tightness.      Past Medical History  Diagnosis Date  . Diabetes mellitus   . Hypertension   . Hypercholesterolemia   . GERD (gastroesophageal reflux disease)   . B12 deficiency   . Chicken pox   . History of diverticulitis of colon     Current Outpatient Prescriptions on File Prior to Visit  Medication Sig Dispense Refill  . aspirin 81 MG tablet Take 81 mg by mouth daily.      . benazepril (LOTENSIN) 40 MG tablet TAKE ONE TABLET BY MOUTH EVERY DAY  90 tablet  1  . beta carotene w/minerals (OCUVITE) tablet Take 1 tablet by mouth daily.      . Calcium Carbonate-Vitamin D (CALCIUM 600+D) 600-400 MG-UNIT per tablet Take 1 tablet by mouth 2 (two) times daily.      . Cholecalciferol (VITAMIN D) 2000 UNITS tablet Take 2,000 Units by mouth daily.      . cyanocobalamin (,VITAMIN B-12,) 1000 MCG/ML injection Inject 1,000 mcg into the muscle every 30 (thirty) days.      Marland Kitchen desonide (DESOWEN) 0.05 % cream As directed  60 g  0  . fish oil-omega-3 fatty acids 1000 MG capsule Take 1 g by mouth 2 (two) times daily.      Marland Kitchen glucose blood (BAYER CONTOUR TEST) test strip Use one strip daily  100 each  12  . hydrochlorothiazide (HYDRODIURIL) 25 MG tablet TAKE ONE-HALF TABLET BY MOUTH EVERY DAY  90 tablet  1  . metFORMIN (GLUCOPHAGE) 500 MG tablet TAKE ONE TABLET BY MOUTH EVERY DAY  90 tablet  1  . omeprazole (PRILOSEC OTC) 20 MG tablet  Take 20 mg by mouth daily.       No current facility-administered medications on file prior to visit.    Review of Systems Patient denies any lightheadedness or dizziness.  No headache.  No chest pain, tightness or palpitations.  No increased shortness of breath, cough or congestion.  No acid reflux.  Controlled.  No nausea or vomiting.  No abdominal pain or cramping.  No bowel change, such as diarrhea, constipation, BRBPR or melana.  No urine change.  Trying to stay active. Some right foot pain.  Limits her walking.   She is also having left shoulder pain.  Unable to take antiinflammatories.  Would like evaluation for possible injection.       Objective:   Physical Exam  Filed Vitals:   05/30/13 0955  BP: 118/80  Pulse: 94  Temp: 98 F (18.52 C)   73 year old female in no acute distress.   HEENT:  Nares- clear.  Oropharynx - without lesions. NECK:  Supple.  Nontender.  No audible bruit.  HEART:  Appears to be regular. LUNGS:  No crackles or wheezing audible.  Respirations even and unlabored.  RADIAL  PULSE:  Equal bilaterally.  ABDOMEN:  Soft, nontender.  Bowel sounds present and normal.  No audible abdominal bruit.     EXTREMITIES:  No increased edema present.  DP pulses palpable and equal bilaterally.  FEET:  No lesions.             Assessment & Plan:  CARDIOVASCULAR.  Currently asymptomatic.  Follow.  ABNORMAL MAMMOGRAM.  Mammogram 02/17/11 rec follow up views.  These were done 02/19/11 and revealed a persistent density in the right upper outer quadrant.  Saw Dr Katrinka Blazing.  Had follow up right breast mammo 2/13.  Stable.  Felt to be benign.  Felt biopsy not warranted.   Recommended a follow up bilateral mammogram in 7/13.  Follow up mammogram 01/2012 recommended six month follow up. Follow up mammogram 2/14 - Birads II.  Was due bilateral mammo 7-8/14.  Mammogram 03/07/13 - Birads I.    HEALTH MAINTENANCE.  Physical 01/24/13.   Mammogram as outlined above.  Colonoscopy 03/19/11 revealed  diverticulosis otherwise normal.  Recommended repeat in five years.  Bone density 01/13/11 - osteopenia (improved).

## 2013-06-04 ENCOUNTER — Encounter: Payer: Self-pay | Admitting: Internal Medicine

## 2013-06-04 DIAGNOSIS — M25512 Pain in left shoulder: Secondary | ICD-10-CM | POA: Insufficient documentation

## 2013-06-04 DIAGNOSIS — M79671 Pain in right foot: Secondary | ICD-10-CM | POA: Insufficient documentation

## 2013-06-04 NOTE — Assessment & Plan Note (Signed)
Low cholesterol diet.  Unable to tolerate statins.  Follow lipid profile.    

## 2013-06-04 NOTE — Assessment & Plan Note (Signed)
Up to date with eye exams.  Just had evaluation 01/19/13.  Will continue current medication regimen.  A1c 7.1.  Discussed diet adjustment and exercise.  Follow.

## 2013-06-04 NOTE — Assessment & Plan Note (Signed)
Mammogram 8//12/14 - Birads I.    

## 2013-06-04 NOTE — Assessment & Plan Note (Signed)
Persistent pain.  Discussed referral to podiatry.  She is planning to see Dr Gavin Potters for her shoulder.  Wants to ask him about her foot.  Follow.

## 2013-06-04 NOTE — Assessment & Plan Note (Signed)
Symptoms controlled on medication.  Follow.   

## 2013-06-04 NOTE — Assessment & Plan Note (Signed)
Blood pressure under good control.  Same medication regimen.  Follow metabolic panel.   

## 2013-06-04 NOTE — Assessment & Plan Note (Signed)
Persistent shoulder pain.  Unable to take increased antiinflammatories.  Would like evaluation for possible injection.  Will refer to Dr Gavin Potters for further evaluation and treatment and possible injection.

## 2013-06-04 NOTE — Assessment & Plan Note (Signed)
Bone density as outlined.  Improved 6/12.    

## 2013-06-28 DIAGNOSIS — M67919 Unspecified disorder of synovium and tendon, unspecified shoulder: Secondary | ICD-10-CM | POA: Diagnosis not present

## 2013-06-28 DIAGNOSIS — M25579 Pain in unspecified ankle and joints of unspecified foot: Secondary | ICD-10-CM | POA: Diagnosis not present

## 2013-06-28 DIAGNOSIS — M159 Polyosteoarthritis, unspecified: Secondary | ICD-10-CM | POA: Diagnosis not present

## 2013-07-11 DIAGNOSIS — M159 Polyosteoarthritis, unspecified: Secondary | ICD-10-CM | POA: Diagnosis not present

## 2013-07-11 DIAGNOSIS — M19019 Primary osteoarthritis, unspecified shoulder: Secondary | ICD-10-CM | POA: Diagnosis not present

## 2013-07-11 DIAGNOSIS — M19079 Primary osteoarthritis, unspecified ankle and foot: Secondary | ICD-10-CM | POA: Diagnosis not present

## 2013-08-01 ENCOUNTER — Other Ambulatory Visit: Payer: Self-pay | Admitting: Internal Medicine

## 2013-09-28 ENCOUNTER — Other Ambulatory Visit: Payer: Self-pay | Admitting: Internal Medicine

## 2013-09-29 ENCOUNTER — Ambulatory Visit (INDEPENDENT_AMBULATORY_CARE_PROVIDER_SITE_OTHER): Payer: Medicare Other | Admitting: Internal Medicine

## 2013-09-29 ENCOUNTER — Encounter: Payer: Self-pay | Admitting: Internal Medicine

## 2013-09-29 VITALS — BP 102/82 | HR 82 | Temp 98.6°F | Ht 63.5 in | Wt 195.0 lb

## 2013-09-29 DIAGNOSIS — I10 Essential (primary) hypertension: Secondary | ICD-10-CM

## 2013-09-29 DIAGNOSIS — M899 Disorder of bone, unspecified: Secondary | ICD-10-CM

## 2013-09-29 DIAGNOSIS — R928 Other abnormal and inconclusive findings on diagnostic imaging of breast: Secondary | ICD-10-CM

## 2013-09-29 DIAGNOSIS — E119 Type 2 diabetes mellitus without complications: Secondary | ICD-10-CM

## 2013-09-29 DIAGNOSIS — M949 Disorder of cartilage, unspecified: Secondary | ICD-10-CM

## 2013-09-29 DIAGNOSIS — K219 Gastro-esophageal reflux disease without esophagitis: Secondary | ICD-10-CM | POA: Diagnosis not present

## 2013-09-29 DIAGNOSIS — E7800 Pure hypercholesterolemia, unspecified: Secondary | ICD-10-CM

## 2013-09-29 DIAGNOSIS — M8580 Other specified disorders of bone density and structure, unspecified site: Secondary | ICD-10-CM

## 2013-09-29 LAB — HEMOGLOBIN A1C: Hgb A1c MFr Bld: 6.7 % — ABNORMAL HIGH (ref 4.6–6.5)

## 2013-09-29 LAB — LIPID PANEL
CHOL/HDL RATIO: 5
Cholesterol: 257 mg/dL — ABNORMAL HIGH (ref 0–200)
HDL: 48.9 mg/dL (ref >39.00)
LDL CALC: 179 mg/dL — AB (ref 0–99)
Triglycerides: 144 mg/dL (ref 0.0–149.0)
VLDL: 28.8 mg/dL (ref 0.0–40.0)

## 2013-09-29 LAB — COMPREHENSIVE METABOLIC PANEL
ALT: 16 U/L (ref 0–35)
AST: 15 U/L (ref 0–37)
Albumin: 3.8 g/dL (ref 3.5–5.2)
Alkaline Phosphatase: 52 U/L (ref 39–117)
BUN: 12 mg/dL (ref 6–23)
CHLORIDE: 103 meq/L (ref 96–112)
CO2: 29 meq/L (ref 19–32)
Calcium: 9.4 mg/dL (ref 8.4–10.5)
Creatinine, Ser: 0.9 mg/dL (ref 0.4–1.2)
GFR: 65.16 mL/min (ref >60.00)
Glucose, Bld: 94 mg/dL (ref 70–99)
Potassium: 4.2 mEq/L (ref 3.5–5.1)
Sodium: 140 mEq/L (ref 135–145)
Total Bilirubin: 0.5 mg/dL (ref 0.3–1.2)
Total Protein: 6.3 g/dL (ref 6.0–8.3)

## 2013-09-29 LAB — TSH: TSH: 0.89 u[IU]/mL (ref 0.35–5.50)

## 2013-09-29 MED ORDER — ACYCLOVIR 400 MG PO TABS: 400.0000 mg | ORAL_TABLET | Freq: Three times a day (TID) | ORAL | Status: DC

## 2013-09-29 NOTE — Progress Notes (Signed)
Pre visit review using our clinic review tool, if applicable. No additional management support is needed unless otherwise documented below in the visit note. 

## 2013-10-01 ENCOUNTER — Encounter: Payer: Self-pay | Admitting: Internal Medicine

## 2013-10-01 NOTE — Assessment & Plan Note (Signed)
Low cholesterol diet.  Unable to tolerate statins.  Follow lipid profile.    

## 2013-10-01 NOTE — Progress Notes (Signed)
Subjective:    Patient ID: Misty Jones, female    DOB: 1941/03/04, 73 y.o.   MRN: [id number]  HPI 73 year old female with past history of diabetes, hypertension and hypercholesterolemia who comes in today for a scheduled follow up.    Overall doing well.  Breathing stable.  No chest pain.  Acid reflux under good control.  Blood pressure is doing well.  Had her eyes checked 01/19/13.   Is staying active.  Breathing stable.  No acid reflux.  Bowels stable.   No sob.  No chest pain or tightness.  Sugars attached.  AM sugars averaging 110-120s.  Overall she feels she is doing well.      Past Medical History  Diagnosis Date  . Diabetes mellitus   . Hypertension   . Hypercholesterolemia   . GERD (gastroesophageal reflux disease)   . B12 deficiency   . Chicken pox   . History of diverticulitis of colon     Current Outpatient Prescriptions on File Prior to Visit  Medication Sig Dispense Refill  . aspirin 81 MG tablet Take 81 mg by mouth daily.      . benazepril (LOTENSIN) 40 MG tablet TAKE ONE TABLET BY MOUTH EVERY DAY  90 tablet  1  . beta carotene w/minerals (OCUVITE) tablet Take 1 tablet by mouth daily.      . Calcium Carbonate-Vitamin D (CALCIUM 600+D) 600-400 MG-UNIT per tablet Take 1 tablet by mouth 2 (two) times daily.      . Cholecalciferol (VITAMIN D) 2000 UNITS tablet Take 2,000 Units by mouth daily.      . cyanocobalamin (,VITAMIN B-12,) 1000 MCG/ML injection Inject 1,000 mcg into the muscle every 30 (thirty) days.      Marland Kitchen desonide (DESOWEN) 0.05 % cream As directed  60 g  0  . fish oil-omega-3 fatty acids 1000 MG capsule Take 1 g by mouth 2 (two) times daily.      Marland Kitchen glucose blood (BAYER CONTOUR TEST) test strip Use one strip daily  100 each  12  . hydrochlorothiazide (HYDRODIURIL) 25 MG tablet TAKE ONE-HALF TABLET BY MOUTH EVERY DAY  90 tablet  0  . metFORMIN (GLUCOPHAGE) 500 MG tablet TAKE ONE TABLET BY MOUTH EVERY DAY  90 tablet  1  . omeprazole (PRILOSEC OTC) 20 MG tablet Take  20 mg by mouth daily.       No current facility-administered medications on file prior to visit.    Review of Systems Patient denies any lightheadedness or dizziness.  No headache.  No chest pain, tightness or palpitations.  No increased shortness of breath, cough or congestion.  No acid reflux.  Controlled.  No nausea or vomiting.  No abdominal pain or cramping.  No bowel change, such as diarrhea, constipation, BRBPR or melana.  No urine change.  Trying to stay active.      Objective:   Physical Exam  Filed Vitals:   09/29/13 1125  BP: 102/82  Pulse: 82  Temp: 98.6 F (37 C)   Blood pressure recheck:  31/61  73 year old female in no acute distress.   HEENT:  Nares- clear.  Oropharynx - without lesions. NECK:  Supple.  Nontender.  No audible bruit.  HEART:  Appears to be regular. LUNGS:  No crackles or wheezing audible.  Respirations even and unlabored.  RADIAL PULSE:  Equal bilaterally.  ABDOMEN:  Soft, nontender.  Bowel sounds present and normal.  No audible abdominal bruit.     EXTREMITIES:  No increased  edema present.  DP pulses palpable and equal bilaterally.  FEET:  No lesions.             Assessment & Plan:  CARDIOVASCULAR.  Currently asymptomatic.  Follow.  ABNORMAL MAMMOGRAM.  Mammogram 02/17/11 rec follow up views.  These were done 02/19/11 and revealed a persistent density in the right upper outer quadrant.  Saw Dr Tamala Julian.  Had follow up right breast mammo 2/13.  Stable.  Felt to be benign.  Felt biopsy not warranted.   Recommended a follow up bilateral mammogram in 7/13.  Follow up mammogram 01/2012 recommended six month follow up. Follow up mammogram 2/14 - Birads II.  Was due bilateral mammo 7-8/14.  Mammogram 03/07/13 - Birads I.    HEALTH MAINTENANCE.  Physical 01/24/13.   Mammogram as outlined above.  Colonoscopy 03/19/11 revealed diverticulosis otherwise normal.  Recommended repeat in five years.  Bone density 01/13/11 - osteopenia (improved).

## 2013-10-01 NOTE — Assessment & Plan Note (Signed)
Mammogram 8//12/14 - Birads I.

## 2013-10-01 NOTE — Assessment & Plan Note (Signed)
Blood pressure under good control.  Same medication regimen.  Follow metabolic panel.   

## 2013-10-01 NOTE — Assessment & Plan Note (Signed)
Bone density as outlined.  Improved 6/12.    

## 2013-10-01 NOTE — Assessment & Plan Note (Signed)
Up to date with eye exams.  Just had evaluation 01/19/13.  Will continue current medication regimen.  Have discussed diet adjustment and exercise.  Follow.  Check met b and a1c.

## 2013-10-01 NOTE — Assessment & Plan Note (Signed)
Symptoms controlled on medication.  Follow.   

## 2013-10-05 ENCOUNTER — Other Ambulatory Visit: Payer: Self-pay | Admitting: Internal Medicine

## 2013-10-16 ENCOUNTER — Other Ambulatory Visit: Payer: Self-pay | Admitting: Internal Medicine

## 2013-11-09 ENCOUNTER — Encounter: Payer: Self-pay | Admitting: Internal Medicine

## 2013-11-23 DIAGNOSIS — M25579 Pain in unspecified ankle and joints of unspecified foot: Secondary | ICD-10-CM | POA: Diagnosis not present

## 2013-11-23 DIAGNOSIS — M659 Synovitis and tenosynovitis, unspecified: Secondary | ICD-10-CM | POA: Diagnosis not present

## 2013-11-27 DIAGNOSIS — K3189 Other diseases of stomach and duodenum: Secondary | ICD-10-CM | POA: Diagnosis not present

## 2013-11-27 DIAGNOSIS — E782 Mixed hyperlipidemia: Secondary | ICD-10-CM | POA: Diagnosis not present

## 2013-11-27 DIAGNOSIS — E119 Type 2 diabetes mellitus without complications: Secondary | ICD-10-CM | POA: Diagnosis not present

## 2013-11-27 DIAGNOSIS — R0789 Other chest pain: Secondary | ICD-10-CM | POA: Diagnosis not present

## 2013-12-11 ENCOUNTER — Ambulatory Visit: Payer: Self-pay | Admitting: Rheumatology

## 2013-12-11 DIAGNOSIS — M719 Bursopathy, unspecified: Secondary | ICD-10-CM | POA: Diagnosis not present

## 2013-12-11 DIAGNOSIS — M25419 Effusion, unspecified shoulder: Secondary | ICD-10-CM | POA: Diagnosis not present

## 2013-12-11 DIAGNOSIS — M19019 Primary osteoarthritis, unspecified shoulder: Secondary | ICD-10-CM | POA: Diagnosis not present

## 2013-12-11 DIAGNOSIS — M67919 Unspecified disorder of synovium and tendon, unspecified shoulder: Secondary | ICD-10-CM | POA: Diagnosis not present

## 2013-12-20 ENCOUNTER — Other Ambulatory Visit: Payer: Self-pay | Admitting: Internal Medicine

## 2014-01-05 ENCOUNTER — Other Ambulatory Visit: Payer: Self-pay | Admitting: Internal Medicine

## 2014-01-08 LAB — HM DIABETES EYE EXAM

## 2014-01-09 DIAGNOSIS — M19019 Primary osteoarthritis, unspecified shoulder: Secondary | ICD-10-CM | POA: Diagnosis not present

## 2014-02-09 ENCOUNTER — Encounter: Payer: Medicare Other | Admitting: Internal Medicine

## 2014-02-26 ENCOUNTER — Ambulatory Visit (INDEPENDENT_AMBULATORY_CARE_PROVIDER_SITE_OTHER): Payer: Medicare Other | Admitting: Internal Medicine

## 2014-02-26 ENCOUNTER — Encounter: Payer: Self-pay | Admitting: Internal Medicine

## 2014-02-26 VITALS — BP 122/80 | HR 89 | Temp 98.4°F | Ht 63.0 in | Wt 182.0 lb

## 2014-02-26 DIAGNOSIS — M79671 Pain in right foot: Secondary | ICD-10-CM

## 2014-02-26 DIAGNOSIS — E7800 Pure hypercholesterolemia, unspecified: Secondary | ICD-10-CM | POA: Diagnosis not present

## 2014-02-26 DIAGNOSIS — M79609 Pain in unspecified limb: Secondary | ICD-10-CM | POA: Diagnosis not present

## 2014-02-26 DIAGNOSIS — E119 Type 2 diabetes mellitus without complications: Secondary | ICD-10-CM

## 2014-02-26 DIAGNOSIS — M949 Disorder of cartilage, unspecified: Secondary | ICD-10-CM | POA: Diagnosis not present

## 2014-02-26 DIAGNOSIS — R928 Other abnormal and inconclusive findings on diagnostic imaging of breast: Secondary | ICD-10-CM | POA: Diagnosis not present

## 2014-02-26 DIAGNOSIS — K219 Gastro-esophageal reflux disease without esophagitis: Secondary | ICD-10-CM

## 2014-02-26 DIAGNOSIS — M25512 Pain in left shoulder: Secondary | ICD-10-CM

## 2014-02-26 DIAGNOSIS — R05 Cough: Secondary | ICD-10-CM | POA: Insufficient documentation

## 2014-02-26 DIAGNOSIS — M899 Disorder of bone, unspecified: Secondary | ICD-10-CM | POA: Diagnosis not present

## 2014-02-26 DIAGNOSIS — I10 Essential (primary) hypertension: Secondary | ICD-10-CM | POA: Diagnosis not present

## 2014-02-26 DIAGNOSIS — Z1239 Encounter for other screening for malignant neoplasm of breast: Secondary | ICD-10-CM

## 2014-02-26 DIAGNOSIS — M25519 Pain in unspecified shoulder: Secondary | ICD-10-CM

## 2014-02-26 DIAGNOSIS — M8580 Other specified disorders of bone density and structure, unspecified site: Secondary | ICD-10-CM

## 2014-02-26 DIAGNOSIS — M79673 Pain in unspecified foot: Secondary | ICD-10-CM

## 2014-02-26 DIAGNOSIS — R059 Cough, unspecified: Secondary | ICD-10-CM | POA: Insufficient documentation

## 2014-02-26 LAB — COMPREHENSIVE METABOLIC PANEL
ALBUMIN: 3.7 g/dL (ref 3.5–5.2)
ALK PHOS: 68 U/L (ref 39–117)
ALT: 15 U/L (ref 0–35)
AST: 19 U/L (ref 0–37)
BUN: 15 mg/dL (ref 6–23)
CO2: 28 meq/L (ref 19–32)
Calcium: 8.8 mg/dL (ref 8.4–10.5)
Chloride: 97 mEq/L (ref 96–112)
Creatinine, Ser: 0.8 mg/dL (ref 0.4–1.2)
GFR: 73.5 mL/min (ref >60.00)
Glucose, Bld: 115 mg/dL — ABNORMAL HIGH (ref 70–99)
Potassium: 4 mEq/L (ref 3.5–5.1)
SODIUM: 133 meq/L — AB (ref 135–145)
Total Bilirubin: 0.4 mg/dL (ref 0.2–1.2)
Total Protein: 6.6 g/dL (ref 6.0–8.3)

## 2014-02-26 LAB — CBC WITH DIFFERENTIAL/PLATELET
BASOS ABS: 0 10*3/uL (ref 0.0–0.1)
Basophils Relative: 0.6 % (ref 0.0–3.0)
EOS ABS: 0.2 10*3/uL (ref 0.0–0.7)
EOS PCT: 3.8 % (ref 0.0–5.0)
HCT: 40.4 % (ref 36.0–46.0)
HEMOGLOBIN: 13.4 g/dL (ref 12.0–15.0)
LYMPHS PCT: 34.1 % (ref 12.0–46.0)
Lymphs Abs: 2.2 10*3/uL (ref 0.7–4.0)
MCHC: 33.2 g/dL (ref 30.0–36.0)
MCV: 90.6 fl (ref 78.0–100.0)
MONOS PCT: 8.4 % (ref 3.0–12.0)
Monocytes Absolute: 0.6 10*3/uL (ref 0.1–1.0)
NEUTROS ABS: 3.5 10*3/uL (ref 1.4–7.7)
Neutrophils Relative %: 53.1 % (ref 43.0–77.0)
Platelets: 302 10*3/uL (ref 150.0–400.0)
RBC: 4.46 Mil/uL (ref 3.87–5.11)
RDW: 14.1 % (ref 11.5–15.5)
WBC: 6.6 10*3/uL (ref 4.0–10.5)

## 2014-02-26 LAB — GLUCOSE, FINGERSTICK (STAT): BLOOD GLUCOSE, FINGERSTICK: 144

## 2014-02-26 LAB — LIPID PANEL
CHOLESTEROL: 236 mg/dL — AB (ref 0–200)
HDL: 41 mg/dL (ref >39.00)
LDL Cholesterol: 166 mg/dL — ABNORMAL HIGH (ref 0–99)
NonHDL: 195
TRIGLYCERIDES: 146 mg/dL (ref 0.0–149.0)
Total CHOL/HDL Ratio: 6
VLDL: 29.2 mg/dL (ref 0.0–40.0)

## 2014-02-26 LAB — VITAMIN D 25 HYDROXY (VIT D DEFICIENCY, FRACTURES): VITD: 53.86 ng/mL (ref 30.00–100.00)

## 2014-02-26 LAB — HEMOGLOBIN A1C: HEMOGLOBIN A1C: 6.5 % (ref 4.6–6.5)

## 2014-02-26 NOTE — Assessment & Plan Note (Signed)
Blood pressure under good control.  Same medication regimen.  Follow metabolic panel.   

## 2014-02-26 NOTE — Assessment & Plan Note (Signed)
Better.  Continue robitussin DM.  Does not feel she needs anything more.  Follow.

## 2014-02-26 NOTE — Assessment & Plan Note (Signed)
Bone density as outlined.  Improved 6/12.

## 2014-02-26 NOTE — Assessment & Plan Note (Signed)
Symptoms controlled on medication.  Follow.

## 2014-02-26 NOTE — Assessment & Plan Note (Signed)
Last eye evaluation 01/19/13.  Scheduled follow up eye exam at the end of August.  Will continue current medication regimen.  She has lost weight.  Sugars better.  Follow.  Check met b and a1c.

## 2014-02-26 NOTE — Assessment & Plan Note (Signed)
Persistent pain.  Limiting her activity.  Request referral to podiatry.  Will refer to Dr Milinda Pointer.

## 2014-02-26 NOTE — Assessment & Plan Note (Signed)
Saw Dr Rudene Christians.  S/p injection.  Helped pain for one day.  Desires no further intervention at this time.  Follow.

## 2014-02-26 NOTE — Assessment & Plan Note (Signed)
Mammogram 8//12/14 - Birads I.  Schedule follow up mammogram.

## 2014-02-26 NOTE — Assessment & Plan Note (Signed)
Low cholesterol diet.  Unable to tolerate statins.  Follow lipid profile.

## 2014-02-26 NOTE — Progress Notes (Signed)
Pre visit review using our clinic review tool, if applicable. No additional management support is needed unless otherwise documented below in the visit note. 

## 2014-02-26 NOTE — Progress Notes (Signed)
Subjective:    Patient ID: Misty Jones, female    DOB: 1940-09-10, 73 y.o.   MRN: [id number]  HPI 73 year old female with past history of diabetes, hypertension and hypercholesterolemia who comes in today to follow up on these issues as well as for a complete physical exam.   Overall doing well.  Breathing stable.  No chest pain.  Acid reflux under good control.  Blood pressure is doing well.  Had her eyes checked 01/19/13.  Planning to follow up with opthalmology 03/24/14.   Is staying active.  Breathing stable.  No acid reflux.  Bowels stable.   No sob.  No chest pain or tightness.  Sugars attached.  Sugars doing well.  She is watching her diet.  Has lost weight.  Overall she feels she is doing well.  She does report increased pain right foot - top of foot (knot).  Request referral to podiatry.  She is also still having some increased left shoulder pain.  Saw Dr Rudene Christians.  Is s/p injection.  Only helped for one day.  Wants to hold on further w/up or treatment right now.  Also reports some increased cough.  Started last week.  Worse last week.  Better today.  No congestion.  No fever.  No increased acid reflux.  No sinus congestion.      Past Medical History  Diagnosis Date  . Diabetes mellitus   . Hypertension   . Hypercholesterolemia   . GERD (gastroesophageal reflux disease)   . B12 deficiency   . Chicken pox   . History of diverticulitis of colon     Current Outpatient Prescriptions on File Prior to Visit  Medication Sig Dispense Refill  . aspirin 81 MG tablet Take 81 mg by mouth daily.      Marland Kitchen BAYER CONTOUR TEST test strip USE TO TEST BLOOD SUGAR DAILY  75 each  5  . benazepril (LOTENSIN) 40 MG tablet TAKE ONE TABLET BY MOUTH ONCE DAILY  90 tablet  1  . beta carotene w/minerals (OCUVITE) tablet Take 1 tablet by mouth daily.      . Calcium Carbonate-Vitamin D (CALCIUM 600+D) 600-400 MG-UNIT per tablet Take 1 tablet by mouth 2 (two) times daily.      . Cholecalciferol (VITAMIN D) 2000 UNITS  tablet Take 1,000 Units by mouth daily.       . cyanocobalamin (,VITAMIN B-12,) 1000 MCG/ML injection Inject 1 mL (1,000 mcg total) into the muscle every 30 (thirty) days.  10 mL  1  . fish oil-omega-3 fatty acids 1000 MG capsule Take 1 g by mouth 2 (two) times daily.      . hydrochlorothiazide (HYDRODIURIL) 25 MG tablet TAKE ONE-HALF TABLET BY MOUTH EVERY DAY  90 tablet  0  . metFORMIN (GLUCOPHAGE) 500 MG tablet TAKE ONE TABLET BY MOUTH EVERY DAY  90 tablet  1  . omeprazole (PRILOSEC OTC) 20 MG tablet Take 20 mg by mouth daily.       No current facility-administered medications on file prior to visit.    Review of Systems Patient denies any lightheadedness or dizziness.  No headache.  No sinus congestion as outlined.  No chest pain, tightness or palpitations.  No increased shortness of breath.  Increased cough as outlined.   No acid reflux.  Controlled.  No nausea or vomiting.  No abdominal pain or cramping.  No bowel change, such as diarrhea, constipation, BRBPR or melana.  No urine change.  Trying to stay active.  Has lost  weight.  Left shoulder pain as outlined.  Right foot pain as outlined.       Objective:   Physical Exam  Filed Vitals:   02/26/14 1322  BP: 122/80  Pulse: 89  Temp: 98.4 F (48.62 C)   73 year old female in no acute distress.   HEENT:  Nares- clear.  Oropharynx - without lesions. NECK:  Supple.  Nontender.  No audible bruit.  HEART:  Appears to be regular. LUNGS:  No crackles or wheezing audible.  Respirations even and unlabored.  RADIAL PULSE:  Equal bilaterally.    BREASTS:  No nipple discharge or nipple retraction present.  Could not appreciate any distinct nodules or axillary adenopathy.  ABDOMEN:  Soft, nontender.  Bowel sounds present and normal.  No audible abdominal bruit.  GU:  Not performed.    EXTREMITIES:  No increased edema present.  DP pulses palpable and equal bilaterally.      FEET:  No lesions.  Increased pain to palpation over the top of the  right foot.       Assessment & Plan:  CARDIOVASCULAR.  Currently asymptomatic.  Follow.  ABNORMAL MAMMOGRAM.  Mammogram 02/17/11 rec follow up views.  These were done 02/19/11 and revealed a persistent density in the right upper outer quadrant.  Saw Dr Tamala Julian.  Had follow up right breast mammo 2/13.  Stable.  Felt to be benign.  Felt biopsy not warranted.   Recommended a follow up bilateral mammogram in 7/13.  Follow up mammogram 01/2012 recommended six month follow up. Follow up mammogram 2/14 - Birads II.  Was due bilateral mammo 7-8/14.  Mammogram 03/07/13 - Birads I.  Schedule follow up mammogram.    HEALTH MAINTENANCE.  Physical today.   Mammogram as outlined above.  Schedule follow up mammogram.  Colonoscopy 03/19/11 revealed diverticulosis otherwise normal.  Recommended repeat in five years.  Bone density 01/13/11 - osteopenia (improved).     I spent 25 minutes with the patient and more than 50% of the time was spent in consultation regarding the above.

## 2014-02-27 ENCOUNTER — Encounter: Payer: Self-pay | Admitting: *Deleted

## 2014-03-13 ENCOUNTER — Telehealth: Payer: Self-pay | Admitting: *Deleted

## 2014-03-13 ENCOUNTER — Telehealth: Payer: Self-pay | Admitting: Internal Medicine

## 2014-03-13 DIAGNOSIS — J40 Bronchitis, not specified as acute or chronic: Secondary | ICD-10-CM | POA: Diagnosis not present

## 2014-03-13 NOTE — Telephone Encounter (Signed)
error 

## 2014-03-13 NOTE — Telephone Encounter (Signed)
I will not be here the rest of the week or I would work her in.  I can work her in on 03/19/14 at 12:00 (if no one else there to be worked in).  If acute problems, will need to be seen earlier and then I can f/u after.

## 2014-03-13 NOTE — Telephone Encounter (Signed)
Pt notified & states that she will go to a walk-in/acut care clinic today. She is planning on going out of town tomorrow.

## 2014-03-13 NOTE — Telephone Encounter (Signed)
C/O cough x 4 weeks. Was seen on 02/26/14. Was told to call back if cough doesn't improve. Pt states that cough hasnt improved at all. She coughs all day & night. No relief. Please advise.

## 2014-03-22 DIAGNOSIS — Q828 Other specified congenital malformations of skin: Secondary | ICD-10-CM | POA: Diagnosis not present

## 2014-03-22 DIAGNOSIS — L821 Other seborrheic keratosis: Secondary | ICD-10-CM | POA: Diagnosis not present

## 2014-03-22 DIAGNOSIS — D1801 Hemangioma of skin and subcutaneous tissue: Secondary | ICD-10-CM | POA: Diagnosis not present

## 2014-03-22 DIAGNOSIS — D235 Other benign neoplasm of skin of trunk: Secondary | ICD-10-CM | POA: Diagnosis not present

## 2014-03-28 ENCOUNTER — Encounter: Payer: Self-pay | Admitting: Podiatry

## 2014-03-28 ENCOUNTER — Other Ambulatory Visit: Payer: Self-pay | Admitting: Internal Medicine

## 2014-03-28 ENCOUNTER — Ambulatory Visit (INDEPENDENT_AMBULATORY_CARE_PROVIDER_SITE_OTHER): Payer: Medicare Other | Admitting: Podiatry

## 2014-03-28 ENCOUNTER — Ambulatory Visit (INDEPENDENT_AMBULATORY_CARE_PROVIDER_SITE_OTHER): Payer: Medicare Other

## 2014-03-28 VITALS — BP 142/88 | HR 83 | Resp 16 | Ht 63.0 in | Wt 180.0 lb

## 2014-03-28 DIAGNOSIS — E119 Type 2 diabetes mellitus without complications: Secondary | ICD-10-CM

## 2014-03-28 DIAGNOSIS — M1990 Unspecified osteoarthritis, unspecified site: Secondary | ICD-10-CM

## 2014-03-28 DIAGNOSIS — M7750 Other enthesopathy of unspecified foot: Secondary | ICD-10-CM | POA: Diagnosis not present

## 2014-03-28 DIAGNOSIS — M129 Arthropathy, unspecified: Secondary | ICD-10-CM

## 2014-03-28 DIAGNOSIS — M778 Other enthesopathies, not elsewhere classified: Secondary | ICD-10-CM

## 2014-03-28 DIAGNOSIS — M779 Enthesopathy, unspecified: Secondary | ICD-10-CM

## 2014-03-28 MED ORDER — MELOXICAM 7.5 MG PO TABS: 7.5000 mg | ORAL_TABLET | Freq: Every day | ORAL | Status: DC

## 2014-03-28 NOTE — Progress Notes (Signed)
   Subjective:    Patient ID: Misty Jones, female    DOB: May 24, 1941, 73 y.o.   MRN: [id number]  HPI Comments: Right foot pain, i broke the foot in 2006 and i have arthritis in the foot. About a year ago or sooner the foot started to bother me, it hurts across the top of the foot my toes are starting to curl. Diabetic for 10 years , the last a1c 6.5   Foot Pain Associated symptoms include coughing.      Review of Systems  Respiratory: Positive for cough.   Endocrine:       Diabetes   Genitourinary: Positive for frequency.  Musculoskeletal:       Joint pain   All other systems reviewed and are negative.      Objective:   Physical Exam: I have reviewed her past medical history medications allergies surgeries social history and review of systems. Pulses are strongly palpable bilateral. Neurologic sensorium is intact per Semmes-Weinstein monofilament. He can reflexes are intact bilateral muscle strength is 5 over 5 dorsiflexors plantar flexors inverters everters all his musculature is intact. Orthopedic evaluation demonstrates all joints distal to the ankle a full range of motion without crepitation. She has a large nonpulsatile nodule to the dorsal aspect of the right foot overlying the first second and third metatarsal cuneiform joints. This is exquisitely tender on palpation. This does not appear to be pulsatile or tumorous however does appear to be well form and nodulated. 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          Assessment & Plan:  Assessment: Capsulitis osteoarthritis right foot over left.  Plan: Injected the dorsal aspect with Kenalog and local anesthetic discussed appropriate shoe gear stretching exercises and ice therapy. Started her on what to get. Followup with her in one month. Orthotics may be necessary.

## 2014-04-05 DIAGNOSIS — E1139 Type 2 diabetes mellitus with other diabetic ophthalmic complication: Secondary | ICD-10-CM | POA: Diagnosis not present

## 2014-04-10 ENCOUNTER — Ambulatory Visit: Payer: Self-pay | Admitting: Internal Medicine

## 2014-04-10 DIAGNOSIS — Z1231 Encounter for screening mammogram for malignant neoplasm of breast: Secondary | ICD-10-CM | POA: Diagnosis not present

## 2014-04-16 ENCOUNTER — Encounter: Payer: Self-pay | Admitting: Internal Medicine

## 2014-04-16 DIAGNOSIS — Z23 Encounter for immunization: Secondary | ICD-10-CM | POA: Diagnosis not present

## 2014-05-04 ENCOUNTER — Other Ambulatory Visit: Payer: Self-pay | Admitting: *Deleted

## 2014-05-04 MED ORDER — GLUCOSE BLOOD VI STRP: ORAL_STRIP | Status: DC

## 2014-05-04 NOTE — Telephone Encounter (Signed)
Fx from pharmacy, needing new Rx for test strips with ICD 10. Rx sent to pharmacy by escript

## 2014-05-07 ENCOUNTER — Ambulatory Visit (INDEPENDENT_AMBULATORY_CARE_PROVIDER_SITE_OTHER): Payer: Medicare Other | Admitting: Podiatry

## 2014-05-07 VITALS — BP 124/76 | HR 88 | Resp 16

## 2014-05-07 DIAGNOSIS — M778 Other enthesopathies, not elsewhere classified: Secondary | ICD-10-CM

## 2014-05-07 DIAGNOSIS — M779 Enthesopathy, unspecified: Principal | ICD-10-CM

## 2014-05-07 DIAGNOSIS — M7751 Other enthesopathy of right foot: Secondary | ICD-10-CM | POA: Diagnosis not present

## 2014-05-07 MED ORDER — MELOXICAM 7.5 MG PO TABS: 7.5000 mg | ORAL_TABLET | Freq: Every day | ORAL | Status: DC

## 2014-05-07 NOTE — Progress Notes (Signed)
She presents today for followup of her capsulitis right foot. She states that after the injection he may have improved a little bit however with all the range recently I suspect that as well as making my foot hurts. She did not received a prescription that we called in to her pharmacy last visit.  Objective: Vital signs are stable she is alert and oriented x3. She has pain on palpation dorsal aspect of the right foot and review of old radiographs confirm osteoarthritis midfoot Lisfranc joints right.  Assessment: Capsulitis osteoarthritis right foot.  Plan: Discussed etiology pathology conservative versus surgical therapies discussed her obtaining her 7.5 mg of meloxicam from the pharmacy starting that immediately. I also suggested orthotics. She declined the orthotics at this time I will followup with me later date for either injections for orthotics.

## 2014-05-23 DIAGNOSIS — H2511 Age-related nuclear cataract, right eye: Secondary | ICD-10-CM | POA: Diagnosis not present

## 2014-05-23 DIAGNOSIS — H2512 Age-related nuclear cataract, left eye: Secondary | ICD-10-CM | POA: Diagnosis not present

## 2014-05-23 DIAGNOSIS — E119 Type 2 diabetes mellitus without complications: Secondary | ICD-10-CM | POA: Diagnosis not present

## 2014-05-23 DIAGNOSIS — H02839 Dermatochalasis of unspecified eye, unspecified eyelid: Secondary | ICD-10-CM | POA: Diagnosis not present

## 2014-06-20 DIAGNOSIS — M25511 Pain in right shoulder: Secondary | ICD-10-CM | POA: Diagnosis not present

## 2014-06-20 DIAGNOSIS — M7541 Impingement syndrome of right shoulder: Secondary | ICD-10-CM | POA: Diagnosis not present

## 2014-06-22 ENCOUNTER — Other Ambulatory Visit: Payer: Self-pay | Admitting: Internal Medicine

## 2014-06-25 ENCOUNTER — Other Ambulatory Visit: Payer: Self-pay | Admitting: *Deleted

## 2014-06-25 MED ORDER — BENAZEPRIL HCL 40 MG PO TABS: 40.0000 mg | ORAL_TABLET | Freq: Every day | ORAL | Status: DC

## 2014-06-28 ENCOUNTER — Encounter (INDEPENDENT_AMBULATORY_CARE_PROVIDER_SITE_OTHER): Payer: Self-pay

## 2014-06-28 ENCOUNTER — Encounter: Payer: Self-pay | Admitting: Internal Medicine

## 2014-06-28 ENCOUNTER — Ambulatory Visit (INDEPENDENT_AMBULATORY_CARE_PROVIDER_SITE_OTHER): Payer: Medicare Other | Admitting: Internal Medicine

## 2014-06-28 VITALS — BP 110/78 | HR 77 | Temp 98.1°F | Ht 63.0 in | Wt 182.5 lb

## 2014-06-28 DIAGNOSIS — E7800 Pure hypercholesterolemia, unspecified: Secondary | ICD-10-CM

## 2014-06-28 DIAGNOSIS — E669 Obesity, unspecified: Secondary | ICD-10-CM

## 2014-06-28 DIAGNOSIS — R928 Other abnormal and inconclusive findings on diagnostic imaging of breast: Secondary | ICD-10-CM

## 2014-06-28 DIAGNOSIS — E119 Type 2 diabetes mellitus without complications: Secondary | ICD-10-CM

## 2014-06-28 DIAGNOSIS — M25511 Pain in right shoulder: Secondary | ICD-10-CM | POA: Insufficient documentation

## 2014-06-28 DIAGNOSIS — M79671 Pain in right foot: Secondary | ICD-10-CM

## 2014-06-28 DIAGNOSIS — I10 Essential (primary) hypertension: Secondary | ICD-10-CM | POA: Diagnosis not present

## 2014-06-28 DIAGNOSIS — K219 Gastro-esophageal reflux disease without esophagitis: Secondary | ICD-10-CM

## 2014-06-28 DIAGNOSIS — M25512 Pain in left shoulder: Secondary | ICD-10-CM

## 2014-06-28 LAB — MICROALBUMIN / CREATININE URINE RATIO
CREATININE, U: 156.4 mg/dL
Microalb Creat Ratio: 0.9 mg/g (ref 0.0–30.0)
Microalb, Ur: 1.4 mg/dL (ref 0.0–1.9)

## 2014-06-28 LAB — COMPREHENSIVE METABOLIC PANEL
ALBUMIN: 3.7 g/dL (ref 3.5–5.2)
ALK PHOS: 61 U/L (ref 39–117)
ALT: 15 U/L (ref 0–35)
AST: 12 U/L (ref 0–37)
BUN: 12 mg/dL (ref 6–23)
CALCIUM: 8.8 mg/dL (ref 8.4–10.5)
CHLORIDE: 100 meq/L (ref 96–112)
CO2: 29 meq/L (ref 19–32)
CREATININE: 0.8 mg/dL (ref 0.4–1.2)
GFR: 70.41 mL/min (ref >60.00)
Glucose, Bld: 111 mg/dL — ABNORMAL HIGH (ref 70–99)
POTASSIUM: 4.1 meq/L (ref 3.5–5.1)
Sodium: 139 mEq/L (ref 135–145)
Total Bilirubin: 0.6 mg/dL (ref 0.2–1.2)
Total Protein: 5.8 g/dL — ABNORMAL LOW (ref 6.0–8.3)

## 2014-06-28 LAB — LIPID PANEL
Cholesterol: 238 mg/dL — ABNORMAL HIGH (ref 0–200)
HDL: 44.1 mg/dL (ref >39.00)
LDL Cholesterol: 167 mg/dL — ABNORMAL HIGH (ref 0–99)
NONHDL: 193.9
Total CHOL/HDL Ratio: 5
Triglycerides: 135 mg/dL (ref 0.0–149.0)
VLDL: 27 mg/dL (ref 0.0–40.0)

## 2014-06-28 LAB — HEMOGLOBIN A1C: HEMOGLOBIN A1C: 6.7 % — AB (ref 4.6–6.5)

## 2014-06-28 NOTE — Progress Notes (Signed)
Pre visit review using our clinic review tool, if applicable. No additional management support is needed unless otherwise documented below in the visit note. 

## 2014-06-28 NOTE — Progress Notes (Signed)
Subjective:    Patient ID: Misty Jones, female    DOB: April 04, 1941, 73 y.o.   MRN: [id number]  HPI 73 year old female with past history of diabetes, hypertension and hypercholesterolemia who comes in today for a scheduled follow up.  Overall doing well.  Breathing stable.  No chest pain.  Acid reflux under good control.  Blood pressure is doing well.  Had her eyes checked - opthalmology 03/24/14.   Is staying active.  No acid reflux.  Bowels stable.    No chest pain or tightness.  Sugars attached.  Sugars doing well.  She is watching her diet.  Overall she feels she is doing well.  She is also still having some increased left shoulder pain.  Saw Dr Rudene Christians.  Is s/p injection.  Better.  Right shoulder pain as well.  Diagnosed with tendonitis.  Starts physical therapy next week.        Past Medical History  Diagnosis Date  . Diabetes mellitus   . Hypertension   . Hypercholesterolemia   . GERD (gastroesophageal reflux disease)   . B12 deficiency   . Chicken pox   . History of diverticulitis of colon     Current Outpatient Prescriptions on File Prior to Visit  Medication Sig Dispense Refill  . aspirin 81 MG tablet Take 81 mg by mouth daily.    . benazepril (LOTENSIN) 40 MG tablet Take 1 tablet (40 mg total) by mouth daily. 90 tablet 1  . beta carotene w/minerals (OCUVITE) tablet Take 1 tablet by mouth daily.    . Calcium Carbonate-Vitamin D (CALCIUM 600+D) 600-400 MG-UNIT per tablet Take 1 tablet by mouth 2 (two) times daily.    . Cholecalciferol (VITAMIN D) 2000 UNITS tablet Take 1,000 Units by mouth daily.     . cyanocobalamin (,VITAMIN B-12,) 1000 MCG/ML injection Inject 1 mL (1,000 mcg total) into the muscle every 30 (thirty) days. 10 mL 1  . fish oil-omega-3 fatty acids 1000 MG capsule Take 1 g by mouth 2 (two) times daily.    Marland Kitchen glucose blood (BAYER CONTOUR TEST) test strip USE TO TEST BLOOD SUGAR DAILY 100 each 5  . hydrochlorothiazide (HYDRODIURIL) 25 MG tablet TAKE ONE-HALF TABLET BY  MOUTH EVERY DAY 90 tablet 0  . metFORMIN (GLUCOPHAGE) 500 MG tablet TAKE ONE TABLET BY MOUTH ONCE DAILY 90 tablet 1  . niacin 500 MG tablet Take 500 mg by mouth at bedtime.    Marland Kitchen omeprazole (PRILOSEC OTC) 20 MG tablet Take 20 mg by mouth daily.     No current facility-administered medications on file prior to visit.    Review of Systems Patient denies any lightheadedness or dizziness.  No headache. No sinus congestion as outlined.  No chest pain, tightness or palpitations.  No increased shortness of breath.  No cough or congestion.  No acid reflux.  Controlled.  No nausea or vomiting.  No abdominal pain or cramping.  No bowel change, such as diarrhea, constipation, BRBPR or melana.  No urine change.  Trying to stay active.  Has lost weight.  Shoulder pain as outlined.      Objective:   Physical Exam  Filed Vitals:   06/28/14 0859  BP: 110/78  Pulse: 77  Temp: 98.1 F (70.94 C)   73 year old female in no acute distress.   HEENT:  Nares- clear.  Oropharynx - without lesions. NECK:  Supple.  Nontender.  No audible bruit.  HEART:  Appears to be regular. LUNGS:  No crackles or  wheezing audible.  Respirations even and unlabored.  RADIAL PULSE:  Equal bilaterally.   ABDOMEN:  Soft, nontender.  Bowel sounds present and normal.  No audible abdominal bruit.   EXTREMITIES:  No increased edema present.  DP pulses palpable and equal bilaterally.      FEET:  No lesions.  Increased pain to palpation over the top of the right foot.       Assessment & Plan:  1. Essential hypertension Blood pressure doing well.  Same medication regimen.  - Comprehensive metabolic panel  2. Gastroesophageal reflux disease, esophagitis presence not specified Controlled.    3. Type 2 diabetes mellitus without complication Sugars doing well.  Continue low carb diet.  Up to date with eye exams.  - Hemoglobin A1c - Microalbumin / creatinine urine ratio Lab Results  Component Value Date   HGBA1C 6.7* 06/28/2014    4. Hypercholesterolemia Low cholesterol diet and exercise.  Unable to take statins.  - Lipid panel  5. Abnormal mammogram See below.  Last mammogram 03/2014 - birads I.    6. Right foot pain Saw Dr Milinda Pointer.  Doing better.   7. Left shoulder pain S/p injection.  Better.    8. Right shoulder pain Seeing ortho.  Diagnosed with tendonitis.  Physical therapy.    9. Obesity (BMI 30-39.9) Diet and exercise.    10. CARDIOVASCULAR.  Currently asymptomatic.  Follow.  11. ABNORMAL MAMMOGRAM.  Mammogram 02/17/11 rec follow up views.  These were done 02/19/11 and revealed a persistent density in the right upper outer quadrant.  Saw Dr Tamala Julian.  Had follow up right breast mammo 2/13.  Stable.  Felt to be benign.  Felt biopsy not warranted.   Recommended a follow up bilateral mammogram in 7/13.  Follow up mammogram 01/2012 recommended six month follow up. Follow up mammogram 2/14 - Birads II.  Was due bilateral mammo 7-8/14.  Mammogram 03/07/13 - Birads I.  Mammogram 04/10/14 - Birads I    HEALTH MAINTENANCE.  Physical 02/26/14.   Mammogram as outlined above.  Colonoscopy 03/19/11 revealed diverticulosis otherwise normal.  Recommended repeat in five years.  Bone density 01/13/11 - osteopenia (improved).

## 2014-06-29 ENCOUNTER — Encounter: Payer: Self-pay | Admitting: *Deleted

## 2014-07-01 ENCOUNTER — Encounter: Payer: Self-pay | Admitting: Internal Medicine

## 2014-07-01 DIAGNOSIS — Z6834 Body mass index (BMI) 34.0-34.9, adult: Secondary | ICD-10-CM | POA: Insufficient documentation

## 2014-07-02 DIAGNOSIS — M25511 Pain in right shoulder: Secondary | ICD-10-CM | POA: Diagnosis not present

## 2014-07-03 DIAGNOSIS — H2511 Age-related nuclear cataract, right eye: Secondary | ICD-10-CM | POA: Diagnosis not present

## 2014-07-04 DIAGNOSIS — E119 Type 2 diabetes mellitus without complications: Secondary | ICD-10-CM | POA: Diagnosis not present

## 2014-07-04 DIAGNOSIS — H25042 Posterior subcapsular polar age-related cataract, left eye: Secondary | ICD-10-CM | POA: Diagnosis not present

## 2014-07-04 DIAGNOSIS — H25012 Cortical age-related cataract, left eye: Secondary | ICD-10-CM | POA: Diagnosis not present

## 2014-07-04 DIAGNOSIS — H2512 Age-related nuclear cataract, left eye: Secondary | ICD-10-CM | POA: Diagnosis not present

## 2014-07-06 DIAGNOSIS — M25511 Pain in right shoulder: Secondary | ICD-10-CM | POA: Diagnosis not present

## 2014-07-10 DIAGNOSIS — M25511 Pain in right shoulder: Secondary | ICD-10-CM | POA: Diagnosis not present

## 2014-08-07 DIAGNOSIS — H2512 Age-related nuclear cataract, left eye: Secondary | ICD-10-CM | POA: Diagnosis not present

## 2014-08-10 ENCOUNTER — Other Ambulatory Visit: Payer: Self-pay | Admitting: Internal Medicine

## 2014-08-24 DIAGNOSIS — E119 Type 2 diabetes mellitus without complications: Secondary | ICD-10-CM | POA: Diagnosis not present

## 2014-08-24 DIAGNOSIS — E782 Mixed hyperlipidemia: Secondary | ICD-10-CM | POA: Diagnosis not present

## 2014-08-24 DIAGNOSIS — I10 Essential (primary) hypertension: Secondary | ICD-10-CM | POA: Diagnosis not present

## 2014-10-29 ENCOUNTER — Ambulatory Visit (INDEPENDENT_AMBULATORY_CARE_PROVIDER_SITE_OTHER): Payer: Medicare Other | Admitting: Internal Medicine

## 2014-10-29 ENCOUNTER — Encounter: Payer: Self-pay | Admitting: Internal Medicine

## 2014-10-29 VITALS — BP 122/80 | HR 89 | Temp 98.3°F | Ht 63.0 in | Wt 188.4 lb

## 2014-10-29 DIAGNOSIS — I10 Essential (primary) hypertension: Secondary | ICD-10-CM

## 2014-10-29 DIAGNOSIS — E669 Obesity, unspecified: Secondary | ICD-10-CM

## 2014-10-29 DIAGNOSIS — R928 Other abnormal and inconclusive findings on diagnostic imaging of breast: Secondary | ICD-10-CM

## 2014-10-29 DIAGNOSIS — E7800 Pure hypercholesterolemia, unspecified: Secondary | ICD-10-CM

## 2014-10-29 DIAGNOSIS — M25512 Pain in left shoulder: Secondary | ICD-10-CM

## 2014-10-29 DIAGNOSIS — Z0000 Encounter for general adult medical examination without abnormal findings: Secondary | ICD-10-CM

## 2014-10-29 DIAGNOSIS — K219 Gastro-esophageal reflux disease without esophagitis: Secondary | ICD-10-CM | POA: Diagnosis not present

## 2014-10-29 DIAGNOSIS — E119 Type 2 diabetes mellitus without complications: Secondary | ICD-10-CM | POA: Diagnosis not present

## 2014-10-29 MED ORDER — ACYCLOVIR 400 MG PO TABS: ORAL_TABLET | ORAL | Status: DC

## 2014-10-29 NOTE — Progress Notes (Signed)
Patient ID: Misty Jones, female   DOB: Aug 27, 1940, 74 y.o.   MRN: [id number]   Subjective:    Patient ID: Misty Jones, female    DOB: 12-27-1940, 74 y.o.   MRN: [id number]  HPI  Patient here for a scheduled follow up.  Saw Dr Rudene Christians.  Is s/p injection.  Went to physical therapy.  Does keep her awake.  Plans to f/u with ortho.  Had cataract surgery and implant right eye 06/2014 and left eye 07/2014.  Doing well.  Not watching her diet as well.  Discussed diet and exercise.  No cardiac symptoms with increased activity or exertion.  Breathing stable.  Bowels stable.     Past Medical History  Diagnosis Date  . Diabetes mellitus   . Hypertension   . Hypercholesterolemia   . GERD (gastroesophageal reflux disease)   . B12 deficiency   . Chicken pox   . History of diverticulitis of colon     Current Outpatient Prescriptions on File Prior to Visit  Medication Sig Dispense Refill  . aspirin 81 MG tablet Take 81 mg by mouth daily.    . benazepril (LOTENSIN) 40 MG tablet Take 1 tablet (40 mg total) by mouth daily. 90 tablet 1  . beta carotene w/minerals (OCUVITE) tablet Take 1 tablet by mouth daily.    . Calcium Carbonate-Vitamin D (CALCIUM 600+D) 600-400 MG-UNIT per tablet Take 1 tablet by mouth 2 (two) times daily.    . Cholecalciferol (VITAMIN D) 2000 UNITS tablet Take 1,000 Units by mouth daily.     . cyanocobalamin (,VITAMIN B-12,) 1000 MCG/ML injection Inject 1 mL (1,000 mcg total) into the muscle every 30 (thirty) days. 10 mL 1  . fish oil-omega-3 fatty acids 1000 MG capsule Take 1 g by mouth 2 (two) times daily.    Marland Kitchen glucose blood (BAYER CONTOUR TEST) test strip USE TO TEST BLOOD SUGAR DAILY 100 each 5  . hydrochlorothiazide (HYDRODIURIL) 25 MG tablet TAKE ONE-HALF TABLET BY MOUTH ONCE DAILY 90 tablet 0  . metFORMIN (GLUCOPHAGE) 500 MG tablet TAKE ONE TABLET BY MOUTH ONCE DAILY 90 tablet 1  . niacin 500 MG tablet Take 500 mg by mouth at bedtime.    Marland Kitchen omeprazole (PRILOSEC OTC) 20 MG  tablet Take 20 mg by mouth daily.     No current facility-administered medications on file prior to visit.    Review of Systems  Constitutional: Negative for fever and appetite change.  HENT: Negative for congestion and sinus pressure.   Respiratory: Negative for cough, chest tightness and shortness of breath.   Cardiovascular: Negative for chest pain, palpitations and leg swelling.  Gastrointestinal: Negative for nausea, vomiting, abdominal pain and diarrhea.  Genitourinary: Negative for dysuria and difficulty urinating.  Musculoskeletal: Negative for joint swelling.       Increased left shoulder pain.  Seeing ortho.  S/p injection.   Skin: Negative for color change and rash.  Neurological: Negative for dizziness, light-headedness and headaches.  Psychiatric/Behavioral: Negative for dysphoric mood and agitation.       Objective:    Physical Exam  Constitutional: She appears well-developed and well-nourished. No distress.  HENT:  Nose: Nose normal.  Mouth/Throat: Oropharynx is clear and moist.  Neck: Neck supple. No thyromegaly present.  Cardiovascular: Normal rate and regular rhythm.   Pulmonary/Chest: Breath sounds normal. No respiratory distress. She has no wheezes.  Abdominal: Soft. Bowel sounds are normal. There is no tenderness.  Musculoskeletal: She exhibits no edema or tenderness.  Lymphadenopathy:  She has no cervical adenopathy.  Skin: No rash noted. No erythema.  Psychiatric: She has a normal mood and affect. Her behavior is normal.    BP 122/80 mmHg  Pulse 89  Temp(Src) 98.3 F (36.8 C) (Oral)  Ht 5' 3"  (1.6 m)  Wt 188 lb 6 oz (85.446 kg)  BMI 33.38 kg/m2  SpO2 96% Wt Readings from Last 3 Encounters:  10/29/14 188 lb 6 oz (85.446 kg)  06/28/14 182 lb 8 oz (82.781 kg)  03/28/14 180 lb (81.647 kg)     Lab Results  Component Value Date   WBC 6.6 02/26/2014   HGB 13.4 02/26/2014   HCT 40.4 02/26/2014   PLT 302.0 02/26/2014   GLUCOSE 103* 10/31/2014    CHOL 234* 10/31/2014   TRIG 131.0 10/31/2014   HDL 53.30 10/31/2014   LDLDIRECT 171.2 05/26/2013   LDLCALC 155* 10/31/2014   ALT 16 10/31/2014   AST 16 10/31/2014   NA 140 10/31/2014   K 4.9 10/31/2014   CL 102 10/31/2014   CREATININE 0.87 10/31/2014   BUN 17 10/31/2014   CO2 32 10/31/2014   TSH 4.88* 10/31/2014   HGBA1C 6.5 10/31/2014   MICROALBUR 1.4 06/28/2014       Assessment & Plan:   Problem List Items Addressed This Visit    Abnormal mammogram    Mammogram 04/10/14 - Birads I.        Diabetes mellitus    A1c just checked *10/31/14) - 6.5.  Discussed diet and exercise.  Follow met b and a1c.       GERD (gastroesophageal reflux disease)    Symptoms controlled on prilosec.        Health care maintenance    Physical 02/26/14.  Mammogram 9/115/15 - Birads I.  colonoscopy 03/19/11.  Recommend f/u colonoscopy in five years.        Hypercholesterolemia    Intolerant to statins.  Has tried multiple statins.  Continue low cholesterol diet and exercise.  Follow fasting lipid panel.        Hypertension - Primary    Blood pressure has been doing well.  Same medication regimen.  Follow pressures.  Follow metabolic panel.       Left shoulder pain    Seeing ortho.  S/p injection.        Obesity (BMI 30-39.9)    Diet and exercise.  Follow.           [name], MD

## 2014-10-29 NOTE — Progress Notes (Signed)
Pre visit review using our clinic review tool, if applicable. No additional management support is needed unless otherwise documented below in the visit note. 

## 2014-10-30 ENCOUNTER — Telehealth: Payer: Self-pay | Admitting: *Deleted

## 2014-10-30 DIAGNOSIS — I10 Essential (primary) hypertension: Secondary | ICD-10-CM

## 2014-10-30 DIAGNOSIS — M25512 Pain in left shoulder: Secondary | ICD-10-CM

## 2014-10-30 DIAGNOSIS — E119 Type 2 diabetes mellitus without complications: Secondary | ICD-10-CM

## 2014-10-30 DIAGNOSIS — E7800 Pure hypercholesterolemia, unspecified: Secondary | ICD-10-CM

## 2014-10-30 NOTE — Telephone Encounter (Signed)
Pt coming tomorrow what labs and dx?

## 2014-10-31 ENCOUNTER — Other Ambulatory Visit (INDEPENDENT_AMBULATORY_CARE_PROVIDER_SITE_OTHER): Payer: Medicare Other

## 2014-10-31 DIAGNOSIS — E7800 Pure hypercholesterolemia, unspecified: Secondary | ICD-10-CM

## 2014-10-31 DIAGNOSIS — E119 Type 2 diabetes mellitus without complications: Secondary | ICD-10-CM

## 2014-10-31 DIAGNOSIS — I10 Essential (primary) hypertension: Secondary | ICD-10-CM

## 2014-10-31 LAB — LIPID PANEL
Cholesterol: 234 mg/dL — ABNORMAL HIGH (ref 0–200)
HDL: 53.3 mg/dL (ref >39.00)
LDL Cholesterol: 155 mg/dL — ABNORMAL HIGH (ref 0–99)
NonHDL: 180.7
TRIGLYCERIDES: 131 mg/dL (ref 0.0–149.0)
Total CHOL/HDL Ratio: 4
VLDL: 26.2 mg/dL (ref 0.0–40.0)

## 2014-10-31 LAB — COMPREHENSIVE METABOLIC PANEL
ALK PHOS: 55 U/L (ref 39–117)
ALT: 16 U/L (ref 0–35)
AST: 16 U/L (ref 0–37)
Albumin: 3.9 g/dL (ref 3.5–5.2)
BILIRUBIN TOTAL: 0.5 mg/dL (ref 0.2–1.2)
BUN: 17 mg/dL (ref 6–23)
CALCIUM: 9.2 mg/dL (ref 8.4–10.5)
CO2: 32 meq/L (ref 19–32)
Chloride: 102 mEq/L (ref 96–112)
Creatinine, Ser: 0.87 mg/dL (ref 0.40–1.20)
GFR: 67.56 mL/min (ref >60.00)
Glucose, Bld: 103 mg/dL — ABNORMAL HIGH (ref 70–99)
Potassium: 4.9 mEq/L (ref 3.5–5.1)
SODIUM: 140 meq/L (ref 135–145)
TOTAL PROTEIN: 6.1 g/dL (ref 6.0–8.3)

## 2014-10-31 LAB — HEMOGLOBIN A1C: Hgb A1c MFr Bld: 6.5 % (ref 4.6–6.5)

## 2014-10-31 LAB — TSH: TSH: 4.88 u[IU]/mL — AB (ref 0.35–4.50)

## 2014-10-31 NOTE — Telephone Encounter (Signed)
Order placed for labs.

## 2014-11-01 ENCOUNTER — Encounter: Payer: Self-pay | Admitting: *Deleted

## 2014-11-01 ENCOUNTER — Other Ambulatory Visit: Payer: Self-pay | Admitting: Internal Medicine

## 2014-11-01 DIAGNOSIS — R7989 Other specified abnormal findings of blood chemistry: Secondary | ICD-10-CM

## 2014-11-01 NOTE — Progress Notes (Signed)
Order placed for f/u tsh.  

## 2014-11-03 ENCOUNTER — Encounter: Payer: Self-pay | Admitting: Internal Medicine

## 2014-11-03 DIAGNOSIS — Z0000 Encounter for general adult medical examination without abnormal findings: Secondary | ICD-10-CM | POA: Insufficient documentation

## 2014-11-03 NOTE — Assessment & Plan Note (Signed)
Diet and exercise.  Follow.  

## 2014-11-03 NOTE — Assessment & Plan Note (Signed)
Blood pressure has been doing well.  Same medication regimen.  Follow pressures.  Follow metabolic panel.   

## 2014-11-03 NOTE — Assessment & Plan Note (Signed)
Physical 02/26/14.  Mammogram 9/115/15 - Birads I.  colonoscopy 03/19/11.  Recommend f/u colonoscopy in five years.

## 2014-11-03 NOTE — Assessment & Plan Note (Signed)
A1c just checked *10/31/14) - 6.5.  Discussed diet and exercise.  Follow met b and a1c.

## 2014-11-03 NOTE — Assessment & Plan Note (Signed)
Symptoms controlled on prilosec.

## 2014-11-03 NOTE — Assessment & Plan Note (Signed)
Intolerant to statins.  Has tried multiple statins.  Continue low cholesterol diet and exercise.  Follow fasting lipid panel.

## 2014-11-03 NOTE — Assessment & Plan Note (Signed)
Mammogram 04/10/14 - Birads I.

## 2014-11-03 NOTE — Assessment & Plan Note (Signed)
Seeing ortho.  S/p injection.  ?

## 2014-11-13 NOTE — H&P (Signed)
PATIENT NAME:  Misty Jones, Misty Jones MR#:  [id number] DATE OF BIRTH:  Aug 17, 1940  DATE OF ADMISSION:  04/03/2012  PRIMARY CARE PROVIDER: [name], MD   CARDIOLOGIST: Serafina Royals, MD   CHIEF COMPLAINT: Chest pain.   HISTORY OF PRESENT ILLNESS: The patient is a 74 year old Caucasian female with a history of hypertension, hyperlipidemia, family history of coronary artery disease, and diabetes mellitus type II who presents with chest pain that has been ongoing for many hours. The patient woke up at 5 a.m. on September 7th with chest pressure that was sudden onset and constant. She notes that it was initially moderate intensity, about 4 out of 10, but persistent throughout the day but due to activity she did not pay much attention to it. She notes that it was nonradiating and it was substernal across the center of her chest. She denies nausea, vomiting, cough, weakness, dizziness, or shortness of breath. She does note that she felt some palpitations. There were no aggravating or alleviating factors. This evening she developed some intermittent numbness down her left arm so she presented to the Emergency Room. On the morning of presentation she felt her face was hot, overall felt agitated. She checked her blood pressure multiple times and systolic blood pressure ranged between 160 to 180 prompting her to present to the Emergency Department.   In the Emergency Department she was noted to have elevated blood pressure with systolic blood pressure 702/63. She has received aspirin, nitroglycerin, and therapeutic Lovenox.   PAST MEDICAL HISTORY:  1. Hypertension.  2. Diabetes.  3. Hyperlipidemia.  4. Gastroesophageal reflux disease. 5. Vitamin B12 deficiency.  6. Statin myopathy.  7. Disk herniation.    PAST SURGICAL HISTORY:  1. Partial hysterectomy with partial oophorectomy. She notes one ovary was removed.  2. Herniated disk status post back surgery. 3. Cholecystectomy.   ALLERGIES: Augmentin  causes emesis.   FAMILY HISTORY: Father deceased at age 46. He had stroke and coronary artery disease. He had his first heart attack at age 80 and subsequently passed away at age 8 with a massive heart attack. Mother had hypertension and facial cancer. Sister is deceased. She had history of pulmonary hypertension, congestive heart failure, and Raynaud's disease.   SOCIAL HISTORY: She is retired from [employer]. She quit tobacco 30 years ago. She denies alcohol or illicit drug use. She is not married. She has six grandchildren and three great-grandchildren and two daughters.   REVIEW OF SYSTEMS: CONSTITUTIONAL: Denies fevers, weight loss, weight gain. Notes that she has felt a little cold and hot intermittently today. EYES: Denies blurred or double vision. Denies eye pain. ENT: Denies tinnitus or ear pain. RESPIRATION: Denies cough, wheeze, shortness of breath. CARDIOVASCULAR: Admits to chest pain. Denies orthopnea, edema. Admits to palpitations. Admits to some dyspnea on exertion. GI: Denies nausea, vomiting, diarrhea, abdominal pain. GU: Denies dysuria. Cannot frankly deny frequency because she has polyuria chronically due to her diuretic. ENDOCRINE: She denies increased sweating or increased thirst intake. INTEGUMENTARY: Denies rash. MUSCULOSKELETAL: Denies muscle or joint aches or swelling. NEUROLOGICAL: Admits to some intermittent numbness tonight. No weakness, dysarthria. Has headaches chronically but nothing new today. PSYCH: Denies depression.   PHYSICAL EXAMINATION:   VITAL SIGNS: Blood pressure 203/84,  temperature 97, pulse 87, respirations 18, sating 100% on room air. At the time of my evaluation blood pressure is 166/70, heart rate is 74.   GENERAL: Well appearing Caucasian female resting comfortably in stretcher. She is in no apparent distress.   EYES:  Pupils equal, round, and reactive to light and accommodation. Anicteric sclerae. Normal eyelids.   ENT: Normal external ears and  nares. There is a crease in the tragus consistent with Frank's sign on her bilateral ears. Posterior oropharynx was clear without exudate. Moist mucous membranes. There are no oral lesions appreciated. Normal nares.   NECK: Supple. There is no thyromegaly.   LYMPHATICS: There is no palpable cervical, axillary, nor inguinal lymphadenopathy.   CARDIOVASCULAR: Normal S1, S2, regular rate and rhythm. No murmurs appreciated. There is no carotid bruit. There is no JVD. There is no pretibial edema. Posterior tibialis pulses are palpable bilaterally, however, dorsalis pedis pulses are not.   PULMONARY: Clear to auscultation bilaterally. No wheezes, rales, or rhonchi. Normal respiratory effort.   ABDOMEN: Soft, nontender, nondistended with normal bowel sounds. There is no hepatomegaly.   MUSCULOSKELETAL: There is normal tone, full strength. No clubbing or cyanosis appreciated.   NEUROLOGIC: Cranial nerves II through XII are intact. There is full strength in bilateral upper and lower extremities. Sensation is intact in bilateral upper and lower extremities.   PSYCH: The patient is awake, alert, and oriented x3 with normal judgment.   LABORATORY DATA: Troponin less than 0.02. CK 126. CK-MB 1. INR 0.9. PTT 28.3. WBC 11.2, hemoglobin 12.8, hematocrit 39.4, platelet count 340 with an MCV of 90, glucose 136, BUN 18, creatinine 0.88, sodium 142, potassium 4.3, chloride 104, bicarb 33, calcium 8.9, bilirubin 0.3, alkaline phosphatase 92, ALT 23, AST 23, total protein 6.7, albumin 3.4, osmolality 287, anion gap 5.   EKG shows normal sinus rhythm at 81 beats per minutes. There is low voltage in the limb leads. There are no ST or T wave abnormalities.   Chest x-ray, preliminary read, shows no active disease without infiltrates, normal heart size.   ASSESSMENT AND PLAN: This is a 74 year old female presenting with chest pressure, paresthesias, found to be severely hypertensive with significant risk factors for  coronary artery disease.  1. Chest pain, underlying etiology unclear. At this time considering malignant hypertension versus acute coronary syndrome versus unstable angina given TIMI risk score of 4. At this time will trend her cardiac enzymes. She has received therapeutic Lovenox in the Emergency Department and nitroglycerin. We will control her blood pressure. We will check an inpatient stress test. Check echo. Check fasting lipid panel. Consult Cardiology, Dr. Nehemiah Massed. The patient has statin myopathy so we will hold statin. Continue aspirin, ACE inhibitor, nitroglycerin, niacin and will start a beta-blocker. If her cardiac enzymes continue to rise, we will continue therapeutic Lovenox at that time. Will await the next set to decide on that course.  2. Hypertension, accelerated. Continue ACE inhibitor, hydrochlorothiazide, start a beta-blocker, and will have hydralazine available p.r.n.  3. Diabetes. Sliding scale insulin at this time. Will hold her metformin. Check hemoglobin A1c. 4. Gastroesophageal reflux disease, stable with Prilosec.  5. Prophylaxis will be DVT prophylaxis with Lovenox. Again, if her troponins begin to rise this will be adjusted to therapeutic Lovenox dosing for ACS therapy. She has received 100 mg of Lovenox in the Emergency Department.   CODE STATUS: FULL CODE. This was discussed with the patient. Her surrogate decision maker is Renelda Mom who is her daughter. She does note that she would not want a prolonged course on a ventilator if the prognosis was poor.   TIME SPENT: 60 minutes.   ____________________________ Samson Frederic, DO aeo:[id number] D: 04/03/2012 03:28:48 ET T: 04/03/2012 09:33:16 ET JOB#: [id number]  cc: Samson Frederic, DO, <  Dictator> [name], MD Corey Skains, MD Sheng Pritz E Tanae Petrosky DO ELECTRONICALLY SIGNED 04/13/2012 0:29

## 2014-11-13 NOTE — Discharge Summary (Signed)
PATIENT NAME:  Misty Jones, Misty Jones MR#:  [id number] DATE OF BIRTH:  1941-05-04  DATE OF ADMISSION:  04/03/2012 DATE OF DISCHARGE:  04/04/2012  PRIMARY CARE PHYSICIAN: [name], MD  CARDIOLOGIST: Serafina Royals, MD  FINAL DIAGNOSES:  1. Chest pain.  2. Malignant hypertension.  3. Diabetes.  4. Gastroesophageal reflux disease.  5. Hyperlipidemia.   DISCHARGE MEDICATIONS: 1. Aspirin 81 mg every other day. 2.  Lotensin 40 mg daily.  3. Hydrochlorothiazide 12.5 mg daily.  4. Metformin 500 mg daily. 5. Fish oil 1000 mg twice a day.  6. Optivite PM 1 tablet daily.  7. AcipHex 20 mg daily.  8. Calcium and vitamin D 2 tablets daily.  9. Vitamin B12 injection monthly. 10. Niacin 250 mg daily.  11. Metoprolol tartrate 25 mg extended-release 1 tablet daily.   DIET: Low sodium diet, regular consistency.   ACTIVITY: As tolerated.   DISCHARGE FOLLOWUP: Follow-up in 1 to 2 weeks with Dr. [name] and as needed with Dr. Nehemiah Massed.   REASON FOR ADMISSION: The patient came into the hospital on 04/03/2012 and was discharged 04/04/2012. She came in with chest pain.   HISTORY OF PRESENT ILLNESS: This is a 74 year old female with hypertension, hyperlipidemia, history of coronary artery disease, and diabetes. She presented with chest pressure, 4 out of 10 in intensity, substernal. Her blood pressure started getting high, as high as 203/96 in the Emergency Room. The patient was admitted with chest pain and malignant hypertension.   LABORATORY, DIAGNOSTIC AND RADIOLOGIC DATA: EKG showed normal sinus rhythm, 81 beats per minute, and no acute ST-T wave changes.   Hemoglobin A1c 6.5. LDL 136, HDL 52, and triglycerides 103. Glucose 136, BUN 18, creatinine 0.88, sodium 142, potassium 4.3, chloride 104, CO2 33, and calcium 8.9. Liver function tests normal. White blood cell count 11.2, hemoglobin and hematocrit 12.8 and 39.4, and platelet count 340. PT/ INR and PTT normal range. Troponin x3 negative.    Chest x-ray: No acute cardiopulmonary disease.  Stress test showed normal treadmill EKG without evidence of ischemia or arrhythmia. Average exercise tolerance for age. Normal left ventricular systolic function. Ejection fraction 60%. Normal myocardial perfusion without evidence of myocardial ischemia.   HOSPITAL COURSE PER PROBLEM LIST:  1. Chest pain. The patient was chest pain-free during the hospital course. The patient does have a lot of anxiety, a lot of stress. This could be part of the cause. The patient's stress test was negative. The patient was discharged home in stable condition. She takes an aspirin every other day.  2. For the patient's malignant hypertension, the patient was added on nitro paste and metoprolol. Blood pressure was actually on the lower side so metoprolol was decreased to Toprol-XL 25 mg daily along with her Lotensin and hydrochlorothiazide. Blood pressure is stable upon discharge at 128/81.  3. For the patient's diabetes, she is on metformin. Hemoglobin A1c is actually very good at 6.5.  4. For her gastroesophageal reflux disease, she is on AcipHex.       5. For her hyperlipidemia, she is on fish oil. LDL is 136. Since her stress test was negative, I just left it at this level. May want to get more aggressive as outpatient with starting a statin, but I will leave this up to her primary care physician.   TIME SPENT ON DISCHARGE: 35 minutes.  ____________________________ Tana Conch. Leslye Peer, MD rjw:slb D: 04/04/2012 15:58:24 ET T: 04/05/2012 13:39:26 ET JOB#: [id number]  cc: Tana Conch. Leslye Peer, MD, <Dictator> [name], MD Darnell Level  Kelli Hope, MD Marisue Brooklyn MD ELECTRONICALLY SIGNED 04/05/2012 16:28

## 2014-11-13 NOTE — Consult Note (Signed)
Brief Consult Note: Diagnosis: Angina /CP/DM.   Patient was seen by consultant.   Consult note dictated.   Recommend further assessment or treatment.   Orders entered.   Discussed with Attending MD.   Comments: IMP Angina? CP Hyperlipidemia DM HTN GERD . PLAN Agree with w/u Myoview is reasonable Low threshold foe cath Ruled out for MI' Continue HTN control Agree with DM management Proceed myoview in amfor BK.  Electronic Signatures: Lujean Amel D (MD)  (Signed 09-Sep-13 06:33)  Authored: Brief Consult Note   Last Updated: 09-Sep-13 06:33 by Yolonda Kida (MD)

## 2014-11-29 ENCOUNTER — Other Ambulatory Visit: Payer: Self-pay

## 2014-12-11 DIAGNOSIS — M25511 Pain in right shoulder: Secondary | ICD-10-CM | POA: Diagnosis not present

## 2014-12-11 DIAGNOSIS — M7541 Impingement syndrome of right shoulder: Secondary | ICD-10-CM | POA: Diagnosis not present

## 2014-12-11 DIAGNOSIS — M792 Neuralgia and neuritis, unspecified: Secondary | ICD-10-CM | POA: Diagnosis not present

## 2014-12-11 DIAGNOSIS — G8929 Other chronic pain: Secondary | ICD-10-CM | POA: Diagnosis not present

## 2014-12-12 ENCOUNTER — Other Ambulatory Visit: Payer: Self-pay | Admitting: Orthopedic Surgery

## 2014-12-12 DIAGNOSIS — G8929 Other chronic pain: Secondary | ICD-10-CM

## 2014-12-12 DIAGNOSIS — M25511 Pain in right shoulder: Principal | ICD-10-CM

## 2014-12-20 ENCOUNTER — Ambulatory Visit
Admission: RE | Admit: 2014-12-20 | Discharge: 2014-12-20 | Disposition: A | Discharge status: Home / Self Care | Payer: Medicare Other | Source: Ambulatory Visit | Attending: Orthopedic Surgery | Admitting: Orthopedic Surgery

## 2014-12-20 DIAGNOSIS — M25511 Pain in right shoulder: Secondary | ICD-10-CM

## 2014-12-20 DIAGNOSIS — M7591 Shoulder lesion, unspecified, right shoulder: Secondary | ICD-10-CM | POA: Diagnosis not present

## 2014-12-20 DIAGNOSIS — G8929 Other chronic pain: Secondary | ICD-10-CM | POA: Diagnosis present

## 2014-12-20 DIAGNOSIS — M19011 Primary osteoarthritis, right shoulder: Secondary | ICD-10-CM | POA: Diagnosis not present

## 2014-12-25 ENCOUNTER — Other Ambulatory Visit: Payer: Self-pay | Admitting: Internal Medicine

## 2014-12-25 DIAGNOSIS — M7541 Impingement syndrome of right shoulder: Secondary | ICD-10-CM | POA: Diagnosis not present

## 2015-01-14 ENCOUNTER — Other Ambulatory Visit: Payer: Self-pay | Admitting: Internal Medicine

## 2015-02-26 ENCOUNTER — Other Ambulatory Visit: Payer: Self-pay | Admitting: Internal Medicine

## 2015-03-01 ENCOUNTER — Encounter: Payer: Self-pay | Admitting: Internal Medicine

## 2015-03-01 ENCOUNTER — Ambulatory Visit (INDEPENDENT_AMBULATORY_CARE_PROVIDER_SITE_OTHER): Payer: Medicare Other | Admitting: Internal Medicine

## 2015-03-01 VITALS — BP 120/80 | HR 73 | Temp 98.2°F | Ht 63.5 in | Wt 192.0 lb

## 2015-03-01 DIAGNOSIS — E669 Obesity, unspecified: Secondary | ICD-10-CM

## 2015-03-01 DIAGNOSIS — R946 Abnormal results of thyroid function studies: Secondary | ICD-10-CM | POA: Diagnosis not present

## 2015-03-01 DIAGNOSIS — E7800 Pure hypercholesterolemia, unspecified: Secondary | ICD-10-CM

## 2015-03-01 DIAGNOSIS — Z0000 Encounter for general adult medical examination without abnormal findings: Secondary | ICD-10-CM

## 2015-03-01 DIAGNOSIS — M8580 Other specified disorders of bone density and structure, unspecified site: Secondary | ICD-10-CM

## 2015-03-01 DIAGNOSIS — R7989 Other specified abnormal findings of blood chemistry: Secondary | ICD-10-CM

## 2015-03-01 DIAGNOSIS — I10 Essential (primary) hypertension: Secondary | ICD-10-CM | POA: Diagnosis not present

## 2015-03-01 DIAGNOSIS — E119 Type 2 diabetes mellitus without complications: Secondary | ICD-10-CM

## 2015-03-01 DIAGNOSIS — R221 Localized swelling, mass and lump, neck: Secondary | ICD-10-CM | POA: Insufficient documentation

## 2015-03-01 DIAGNOSIS — Z1239 Encounter for other screening for malignant neoplasm of breast: Secondary | ICD-10-CM | POA: Diagnosis not present

## 2015-03-01 DIAGNOSIS — K219 Gastro-esophageal reflux disease without esophagitis: Secondary | ICD-10-CM

## 2015-03-01 DIAGNOSIS — M25511 Pain in right shoulder: Secondary | ICD-10-CM

## 2015-03-01 DIAGNOSIS — R928 Other abnormal and inconclusive findings on diagnostic imaging of breast: Secondary | ICD-10-CM

## 2015-03-01 LAB — BASIC METABOLIC PANEL
BUN: 18 mg/dL (ref 6–23)
CALCIUM: 9.3 mg/dL (ref 8.4–10.5)
CO2: 33 mEq/L — ABNORMAL HIGH (ref 19–32)
Chloride: 102 mEq/L (ref 96–112)
Creatinine, Ser: 0.82 mg/dL (ref 0.40–1.20)
GFR: 72.27 mL/min (ref >60.00)
Glucose, Bld: 113 mg/dL — ABNORMAL HIGH (ref 70–99)
POTASSIUM: 5 meq/L (ref 3.5–5.1)
SODIUM: 140 meq/L (ref 135–145)

## 2015-03-01 LAB — CBC WITH DIFFERENTIAL/PLATELET
BASOS PCT: 0.4 % (ref 0.0–3.0)
Basophils Absolute: 0 10*3/uL (ref 0.0–0.1)
EOS PCT: 1.2 % (ref 0.0–5.0)
Eosinophils Absolute: 0.1 10*3/uL (ref 0.0–0.7)
HCT: 41.1 % (ref 36.0–46.0)
Hemoglobin: 13.7 g/dL (ref 12.0–15.0)
LYMPHS PCT: 29.6 % (ref 12.0–46.0)
Lymphs Abs: 2.6 10*3/uL (ref 0.7–4.0)
MCHC: 33.5 g/dL (ref 30.0–36.0)
MCV: 92.3 fl (ref 78.0–100.0)
Monocytes Absolute: 0.5 10*3/uL (ref 0.1–1.0)
Monocytes Relative: 5.5 % (ref 3.0–12.0)
Neutro Abs: 5.5 10*3/uL (ref 1.4–7.7)
Neutrophils Relative %: 63.3 % (ref 43.0–77.0)
Platelets: 304 10*3/uL (ref 150.0–400.0)
RBC: 4.45 Mil/uL (ref 3.87–5.11)
RDW: 13.8 % (ref 11.5–15.5)
WBC: 8.7 10*3/uL (ref 4.0–10.5)

## 2015-03-01 LAB — HEPATIC FUNCTION PANEL
ALT: 13 U/L (ref 0–35)
AST: 15 U/L (ref 0–37)
Albumin: 4.1 g/dL (ref 3.5–5.2)
Alkaline Phosphatase: 60 U/L (ref 39–117)
Bilirubin, Direct: 0.1 mg/dL (ref 0.0–0.3)
Total Bilirubin: 0.4 mg/dL (ref 0.2–1.2)
Total Protein: 6.3 g/dL (ref 6.0–8.3)

## 2015-03-01 LAB — LIPID PANEL
CHOLESTEROL: 237 mg/dL — AB (ref 0–200)
HDL: 55.7 mg/dL (ref >39.00)
LDL CALC: 148 mg/dL — AB (ref 0–99)
NonHDL: 181.61
Total CHOL/HDL Ratio: 4
Triglycerides: 170 mg/dL — ABNORMAL HIGH (ref 0.0–149.0)
VLDL: 34 mg/dL (ref 0.0–40.0)

## 2015-03-01 LAB — HEMOGLOBIN A1C: Hgb A1c MFr Bld: 6.3 % (ref 4.6–6.5)

## 2015-03-01 LAB — TSH: TSH: 3.43 u[IU]/mL (ref 0.35–4.50)

## 2015-03-01 MED ORDER — CEPHALEXIN 500 MG PO CAPS: 500.0000 mg | ORAL_CAPSULE | Freq: Three times a day (TID) | ORAL | Status: DC

## 2015-03-01 NOTE — Progress Notes (Signed)
Pre visit review using our clinic review tool, if applicable. No additional management support is needed unless otherwise documented below in the visit note. 

## 2015-03-01 NOTE — Assessment & Plan Note (Signed)
Right neck nodule as outlined.  Keflex x 1 week.  Reassess in two weeks.  If persistent, may need ENT evaluation.

## 2015-03-01 NOTE — Assessment & Plan Note (Signed)
Blood pressure under good control.  Continue same medication regimen.  Follow pressures.  Follow metabolic panel.   

## 2015-03-01 NOTE — Progress Notes (Signed)
Patient ID: PARA Misty Jones, female   DOB: 22-Feb-1941, 74 y.o.   MRN: [id number]   Subjective:    Patient ID: Misty Jones, female    DOB: 05-11-1941, 74 y.o.   MRN: [id number]  HPI  Patient here to follow up on her current medical issues as well as for a complete physical exam.  She has not been exercising as regularly.  Does try to stay active.  No cardiac symptoms with increased activity or exertion.  No sob.  No acid reflux.  No abdominal pain.  Bowels stable.  Has had some sinus issues.  No fever.  No significant congestion.  No chest congestion.    Past Medical History  Diagnosis Date  . Diabetes mellitus   . Hypertension   . Hypercholesterolemia   . GERD (gastroesophageal reflux disease)   . B12 deficiency   . Chicken pox   . History of diverticulitis of colon     Outpatient Encounter Prescriptions as of 03/01/2015  Medication Sig  . acyclovir (ZOVIRAX) 400 MG tablet Take tid x 5 days and then continue bid  . aspirin 81 MG tablet Take 81 mg by mouth daily.  . benazepril (LOTENSIN) 40 MG tablet TAKE ONE TABLET BY MOUTH ONCE DAILY  . beta carotene w/minerals (OCUVITE) tablet Take 1 tablet by mouth daily.  . Calcium Carbonate-Vitamin D (CALCIUM 600+D) 600-400 MG-UNIT per tablet Take 1 tablet by mouth 2 (two) times daily.  . Cholecalciferol (VITAMIN D) 2000 UNITS tablet Take 1,000 Units by mouth daily.   . cyanocobalamin (,VITAMIN B-12,) 1000 MCG/ML injection Inject 1 mL (1,000 mcg total) into the muscle every 30 (thirty) days.  . fish oil-omega-3 fatty acids 1000 MG capsule Take 1 g by mouth 2 (two) times daily.  Marland Kitchen glucose blood (BAYER CONTOUR TEST) test strip USE TO TEST BLOOD SUGAR DAILY  . hydrochlorothiazide (HYDRODIURIL) 25 MG tablet TAKE ONE-HALF TABLET BY MOUTH ONCE DAILY  . metFORMIN (GLUCOPHAGE) 500 MG tablet TAKE ONE TABLET BY MOUTH ONCE DAILY  . niacin 500 MG tablet Take 500 mg by mouth at bedtime.  Marland Kitchen omeprazole (PRILOSEC OTC) 20 MG tablet Take 20 mg by mouth daily.  .  cephALEXin (KEFLEX) 500 MG capsule Take 1 capsule (500 mg total) by mouth 3 (three) times daily.   No facility-administered encounter medications on file as of 03/01/2015.    Review of Systems  Constitutional: Negative for appetite change and unexpected weight change.  HENT: Positive for congestion (minimal sinus congestion. ). Negative for sinus pressure.   Eyes: Negative for pain and visual disturbance.  Respiratory: Negative for cough, chest tightness and shortness of breath.   Cardiovascular: Negative for chest pain, palpitations and leg swelling.  Gastrointestinal: Negative for nausea, vomiting, abdominal pain and diarrhea.  Genitourinary: Negative for dysuria and difficulty urinating.  Musculoskeletal: Negative for back pain and joint swelling.       Shoulder is doing better.   Skin: Negative for color change and rash.  Neurological: Negative for dizziness, light-headedness and headaches.  Hematological: Negative for adenopathy. Does not bruise/bleed easily.  Psychiatric/Behavioral: Negative for dysphoric mood and agitation.       Objective:    Physical Exam  Constitutional: She is oriented to person, place, and time. She appears well-developed and well-nourished.  HENT:  Nose: Nose normal.  Mouth/Throat: Oropharynx is clear and moist.  Eyes: Right eye exhibits no discharge. Left eye exhibits no discharge. No scleral icterus.  Neck: Neck supple. No thyromegaly present.  Increased palpable fullness  right anterior cervical region.   Cardiovascular: Normal rate and regular rhythm.   Pulmonary/Chest: Breath sounds normal. No accessory muscle usage. No tachypnea. No respiratory distress. She has no decreased breath sounds. She has no wheezes. She has no rhonchi. Right breast exhibits no inverted nipple, no mass, no nipple discharge and no tenderness (no axillary adenopathy). Left breast exhibits no inverted nipple, no mass, no nipple discharge and no tenderness (no axilarry  adenopathy).  Abdominal: Soft. Bowel sounds are normal. There is no tenderness.  Musculoskeletal: She exhibits no edema or tenderness.  Neurological: She is alert and oriented to person, place, and time.  Skin: Skin is warm. No rash noted.  Psychiatric: She has a normal mood and affect. Her behavior is normal.    BP 120/80 mmHg  Pulse 73  Temp(Src) 98.2 F (36.8 C) (Oral)  Ht 5' 3.5" (1.613 m)  Wt 192 lb (87.091 kg)  BMI 33.47 kg/m2  SpO2 96% Wt Readings from Last 3 Encounters:  03/01/15 192 lb (87.091 kg)  12/20/14 190 lb (86.183 kg)  10/29/14 188 lb 6 oz (85.446 kg)     Lab Results  Component Value Date   WBC 8.7 03/01/2015   HGB 13.7 03/01/2015   HCT 41.1 03/01/2015   PLT 304.0 03/01/2015   GLUCOSE 113* 03/01/2015   CHOL 237* 03/01/2015   TRIG 170.0* 03/01/2015   HDL 55.70 03/01/2015   LDLDIRECT 171.2 05/26/2013   LDLCALC 148* 03/01/2015   ALT 13 03/01/2015   AST 15 03/01/2015   NA 140 03/01/2015   K 5.0 03/01/2015   CL 102 03/01/2015   CREATININE 0.82 03/01/2015   BUN 18 03/01/2015   CO2 33* 03/01/2015   TSH 3.43 03/01/2015   INR 0.9 04/03/2012   HGBA1C 6.3 03/01/2015   MICROALBUR 1.4 06/28/2014    Mr Shoulder Right Wo Contrast  12/20/2014   CLINICAL DATA:  Right shoulder pain and weakness for 7 8 months. No known injury.  EXAM: MRI OF THE RIGHT SHOULDER WITHOUT CONTRAST  TECHNIQUE: Multiplanar, multisequence MR imaging of the shoulder was performed. No intravenous contrast was administered.  COMPARISON:  None.  FINDINGS: Rotator cuff: Mild to moderate appearing tendinopathy is seen in the supraspinatus, infraspinatus and subscapularis, most notable in the supraspinatus. No rotator cuff tear is identified.  Muscles:  No atrophy or focal lesion.  Biceps long head: Intrasubstance increased T2 signal is seen within the intra-articular segment of the tendon but the tendon is intact.  Acromioclavicular Joint: Moderately severe appearing acromioclavicular  osteoarthritis is identified.  Glenohumeral Joint: Mild glenohumeral degenerative change is seen with a small osteophyte off the humeral head noted.  Labrum:  Appears intact.  Bones: The acromion is type 1. No fracture or worrisome marrow lesion is identified.  IMPRESSION: Rotator cuff tendinopathy without tear.  Moderately severe appearing acromioclavicular osteoarthritis.  Mild glenohumeral degenerative change.   Electronically Signed   By: Inge Rise M.D.   On: 12/20/2014 13:43       Assessment & Plan:   Problem List Items Addressed This Visit    Abnormal mammogram    Previous mammograms as outlined.  Mammogram 04/10/14 - Birads I.        Diabetes mellitus    Discussed diet and exercise.  She is watching her diet.  Has not been exercising regularly.  Plans to restart.  Follow met b and a1c.  Check today.       Relevant Orders   Hemoglobin A1c (Completed)   GERD (gastroesophageal reflux  disease)    Symptoms controlled on omeprazole.        Relevant Orders   CBC with Differential/Platelet (Completed)   Health care maintenance    Physical today 03/01/15.  Mammogram 04/10/14 - Bir[facility] I.  Colonoscopy 03/19/11.  Recommended f/u colonoscopy in five years.        Hypercholesterolemia   Relevant Orders   Lipid panel (Completed)   Hepatic function panel (Completed)   Hypertension    Blood pressure under good control.  Continue same medication regimen.  Follow pressures.  Follow metabolic panel.         Relevant Orders   TSH   Basic metabolic panel (Completed)   Neck nodule    Right neck nodule as outlined.  Keflex x 1 week.  Reassess in two weeks.  If persistent, may need ENT evaluation.       Obesity (BMI 30-39.9)    Diet and exercise.        Osteopenia    Last bone density improved.  Follow.       Right shoulder pain    Saw ortho.  Had MRI as outlined.  Doing exercises.  Better.  Follow.        Other Visit Diagnoses    Screening breast examination    -  Primary      Relevant Orders    MM DIGITAL SCREENING BILATERAL    Elevated TSH            [name], MD

## 2015-03-03 ENCOUNTER — Encounter: Payer: Self-pay | Admitting: *Deleted

## 2015-03-03 ENCOUNTER — Encounter: Payer: Self-pay | Admitting: Internal Medicine

## 2015-03-03 NOTE — Assessment & Plan Note (Signed)
Symptoms controlled on omeprazole.   

## 2015-03-03 NOTE — Assessment & Plan Note (Signed)
Diet and exercise.   

## 2015-03-03 NOTE — Assessment & Plan Note (Signed)
Physical today 03/01/15.  Mammogram 04/10/14 - [facility] I.  Colonoscopy 03/19/11.  Recommended f/u colonoscopy in five years.

## 2015-03-03 NOTE — Assessment & Plan Note (Signed)
Discussed diet and exercise.  She is watching her diet.  Has not been exercising regularly.  Plans to restart.  Follow met b and a1c.  Check today.

## 2015-03-03 NOTE — Assessment & Plan Note (Signed)
Previous mammograms as outlined.  Mammogram 04/10/14 - [facility] I.

## 2015-03-03 NOTE — Assessment & Plan Note (Signed)
Last bone density improved.  Follow.

## 2015-03-03 NOTE — Assessment & Plan Note (Signed)
Saw ortho.  Had MRI as outlined.  Doing exercises.  Better.  Follow.

## 2015-04-03 ENCOUNTER — Encounter: Payer: Self-pay | Admitting: Internal Medicine

## 2015-04-03 ENCOUNTER — Ambulatory Visit (INDEPENDENT_AMBULATORY_CARE_PROVIDER_SITE_OTHER): Payer: Medicare Other | Admitting: Internal Medicine

## 2015-04-03 VITALS — BP 112/68 | HR 85 | Temp 98.3°F | Resp 18 | Ht 64.0 in | Wt 195.1 lb

## 2015-04-03 DIAGNOSIS — I10 Essential (primary) hypertension: Secondary | ICD-10-CM

## 2015-04-03 DIAGNOSIS — R221 Localized swelling, mass and lump, neck: Secondary | ICD-10-CM

## 2015-04-03 DIAGNOSIS — E7800 Pure hypercholesterolemia, unspecified: Secondary | ICD-10-CM

## 2015-04-03 DIAGNOSIS — E119 Type 2 diabetes mellitus without complications: Secondary | ICD-10-CM

## 2015-04-03 NOTE — Progress Notes (Signed)
Pre visit review using our clinic review tool, if applicable. No additional management support is needed unless otherwise documented below in the visit note. 

## 2015-04-03 NOTE — Progress Notes (Signed)
Patient ID: Misty Jones, female   DOB: 1941-05-11, 74 y.o.   MRN: [id number]   Subjective:    Patient ID: Misty Jones, female    DOB: 05-Dec-1940, 74 y.o.   MRN: [id number]  HPI  Patient here for a scheduled follow up.  Here to f/u regarding the neck nodule.  Took the abx.  No change in the nodule.  No sinus pressure.  No sore throat.  No fever.  No headache or light headedness.  Tries to stay active.  No cardiac symptoms with increased activity or exertion.  No sob.  No acid reflux reported.  No abdominal pain or cramping.  Bowels stable.     Past Medical History  Diagnosis Date  . Diabetes mellitus   . Hypertension   . Hypercholesterolemia   . GERD (gastroesophageal reflux disease)   . B12 deficiency   . Chicken pox   . History of diverticulitis of colon    Past Surgical History  Procedure Laterality Date  . Back surgery  1986    ruptured disc, Dr Hardin Negus [facility])  . Cholecystectomy  1991  . Abdominal hysterectomy  1984  . Cholecystectomy  1992   Family History  Problem Relation Age of Onset  . Hypertension Mother   . Heart disease Father     myocardial infarction  . Raynaud syndrome Sister   . Thyroid disease Sister   . Asthma Sister   . Breast cancer Neg Hx   . Colon cancer Neg Hx    Social History   Social History  . Marital Status: Married    Spouse Name: N/A  . Number of Children: 2  . Years of Education: N/A   Social History Main Topics  . Smoking status: Former Research scientist (life sciences)  . Smokeless tobacco: Never Used     Comment: quit smoking years ago  . Alcohol Use: No  . Drug Use: No  . Sexual Activity: No   Other Topics Concern  . None   Social History Narrative    Outpatient Encounter Prescriptions as of 04/03/2015  Medication Sig  . acyclovir (ZOVIRAX) 400 MG tablet Take tid x 5 days and then continue bid  . aspirin 81 MG tablet Take 81 mg by mouth daily.  . benazepril (LOTENSIN) 40 MG tablet TAKE ONE TABLET BY MOUTH ONCE DAILY  . beta carotene  w/minerals (OCUVITE) tablet Take 1 tablet by mouth daily.  . Calcium Carbonate-Vitamin D (CALCIUM 600+D) 600-400 MG-UNIT per tablet Take 1 tablet by mouth 2 (two) times daily.  . cephALEXin (KEFLEX) 500 MG capsule Take 1 capsule (500 mg total) by mouth 3 (three) times daily.  . Cholecalciferol (VITAMIN D) 2000 UNITS tablet Take 1,000 Units by mouth daily.   . cyanocobalamin (,VITAMIN B-12,) 1000 MCG/ML injection Inject 1 mL (1,000 mcg total) into the muscle every 30 (thirty) days.  . fish oil-omega-3 fatty acids 1000 MG capsule Take 1 g by mouth 2 (two) times daily.  Marland Kitchen glucose blood (BAYER CONTOUR TEST) test strip USE TO TEST BLOOD SUGAR DAILY  . hydrochlorothiazide (HYDRODIURIL) 25 MG tablet TAKE ONE-HALF TABLET BY MOUTH ONCE DAILY  . metFORMIN (GLUCOPHAGE) 500 MG tablet TAKE ONE TABLET BY MOUTH ONCE DAILY  . niacin 500 MG tablet Take 500 mg by mouth at bedtime.  Marland Kitchen omeprazole (PRILOSEC OTC) 20 MG tablet Take 20 mg by mouth daily.   No facility-administered encounter medications on file as of 04/03/2015.    Review of Systems  Constitutional: Negative for appetite change  and unexpected weight change.  HENT: Negative for congestion and sinus pressure.   Respiratory: Negative for cough, chest tightness and shortness of breath.   Cardiovascular: Negative for chest pain and palpitations.  Gastrointestinal: Negative for nausea, vomiting and diarrhea.  Genitourinary: Negative for dysuria and difficulty urinating.  Skin: Negative for color change and rash.  Neurological: Negative for dizziness, light-headedness and headaches.  Hematological:       Persistent neck nodule as outlined.         Objective:    Physical Exam  Constitutional: She appears well-developed and well-nourished. No distress.  HENT:  Nose: Nose normal.  Mouth/Throat: Oropharynx is clear and moist.  Eyes: Conjunctivae are normal. Right eye exhibits no discharge. Left eye exhibits no discharge.  Neck: Neck supple.    Persistent anterior cervical fullness.    Cardiovascular: Normal rate and regular rhythm.   Pulmonary/Chest: Breath sounds normal. No respiratory distress. She has no wheezes.  Abdominal: Soft. Bowel sounds are normal. There is no tenderness.  Musculoskeletal: She exhibits no edema or tenderness.  Skin: No rash noted. No erythema.    BP 112/68 mmHg  Pulse 85  Temp(Src) 98.3 F (36.8 C) (Oral)  Resp 18  Ht _0  (1.626 m)  Wt 195 lb 2 oz (88.508 kg)  BMI 33.48 kg/m2  SpO2 97% Wt Readings from Last 3 Encounters:  04/03/15 195 lb 2 oz (88.508 kg)  03/01/15 192 lb (87.091 kg)  12/20/14 190 lb (86.183 kg)     Lab Results  Component Value Date   WBC 8.7 03/01/2015   HGB 13.7 03/01/2015   HCT 41.1 03/01/2015   PLT 304.0 03/01/2015   GLUCOSE 113* 03/01/2015   CHOL 237* 03/01/2015   TRIG 170.0* 03/01/2015   HDL 55.70 03/01/2015   LDLDIRECT 171.2 05/26/2013   LDLCALC 148* 03/01/2015   ALT 13 03/01/2015   AST 15 03/01/2015   NA 140 03/01/2015   K 5.0 03/01/2015   CL 102 03/01/2015   CREATININE 0.82 03/01/2015   BUN 18 03/01/2015   CO2 33* 03/01/2015   TSH 3.43 03/01/2015   INR 0.9 04/03/2012   HGBA1C 6.3 03/01/2015   MICROALBUR 1.4 06/28/2014       Assessment & Plan:   Problem List Items Addressed This Visit    Diabetes mellitus    Sugars have been better.  Continue low carb diet and exercise.  Follow met b and a1c.       Relevant Orders   TSH   Basic metabolic panel   Hemoglobin A1c   Microalbumin / creatinine urine ratio   Hypercholesterolemia   Relevant Orders   Lipid panel   Hepatic function panel   Hypertension    Blood pressure under good control.  Continue same medication regimen.  Follow pressures.  Follow metabolic panel.        Neck nodule - Primary    Persistent right neck nodule.  Did not improve with abx.  Refer to ENT for further evaluation.        Relevant Orders   Ambulatory referral to ENT       [name], MD

## 2015-04-08 ENCOUNTER — Encounter: Payer: Self-pay | Admitting: Internal Medicine

## 2015-04-08 NOTE — Assessment & Plan Note (Signed)
Persistent right neck nodule.  Did not improve with abx.  Refer to ENT for further evaluation.

## 2015-04-08 NOTE — Assessment & Plan Note (Signed)
Blood pressure under good control.  Continue same medication regimen.  Follow pressures.  Follow metabolic panel.   

## 2015-04-08 NOTE — Assessment & Plan Note (Signed)
Sugars have been better.  Continue low carb diet and exercise.  Follow met b and a1c.

## 2015-04-11 DIAGNOSIS — H3531 Nonexudative age-related macular degeneration: Secondary | ICD-10-CM | POA: Diagnosis not present

## 2015-04-11 LAB — HM DIABETES EYE EXAM

## 2015-04-12 ENCOUNTER — Ambulatory Visit
Admission: RE | Admit: 2015-04-12 | Discharge: 2015-04-12 | Disposition: A | Discharge status: Home / Self Care | Payer: Medicare Other | Source: Ambulatory Visit | Attending: Internal Medicine | Admitting: Internal Medicine

## 2015-04-12 ENCOUNTER — Other Ambulatory Visit: Payer: Self-pay | Admitting: Internal Medicine

## 2015-04-12 ENCOUNTER — Ambulatory Visit: Payer: BLUE CROSS/BLUE SHIELD

## 2015-04-12 DIAGNOSIS — Z1231 Encounter for screening mammogram for malignant neoplasm of breast: Secondary | ICD-10-CM | POA: Insufficient documentation

## 2015-04-12 DIAGNOSIS — Z1239 Encounter for other screening for malignant neoplasm of breast: Secondary | ICD-10-CM

## 2015-04-17 ENCOUNTER — Other Ambulatory Visit: Payer: Self-pay | Admitting: Otolaryngology

## 2015-04-17 DIAGNOSIS — E041 Nontoxic single thyroid nodule: Secondary | ICD-10-CM | POA: Diagnosis not present

## 2015-04-17 DIAGNOSIS — R221 Localized swelling, mass and lump, neck: Secondary | ICD-10-CM | POA: Diagnosis not present

## 2015-04-22 ENCOUNTER — Ambulatory Visit: Payer: Medicare Other

## 2015-04-30 ENCOUNTER — Ambulatory Visit
Admission: RE | Admit: 2015-04-30 | Discharge: 2015-04-30 | Disposition: A | Discharge status: Home / Self Care | Payer: Medicare Other | Source: Ambulatory Visit | Attending: Otolaryngology | Admitting: Otolaryngology

## 2015-04-30 DIAGNOSIS — R221 Localized swelling, mass and lump, neck: Secondary | ICD-10-CM | POA: Insufficient documentation

## 2015-04-30 DIAGNOSIS — E042 Nontoxic multinodular goiter: Secondary | ICD-10-CM | POA: Diagnosis not present

## 2015-05-02 ENCOUNTER — Other Ambulatory Visit: Payer: Self-pay | Admitting: Internal Medicine

## 2015-05-08 DIAGNOSIS — Z23 Encounter for immunization: Secondary | ICD-10-CM | POA: Diagnosis not present

## 2015-05-10 ENCOUNTER — Other Ambulatory Visit: Payer: Self-pay | Admitting: Otolaryngology

## 2015-05-10 DIAGNOSIS — L03211 Cellulitis of face: Secondary | ICD-10-CM | POA: Diagnosis not present

## 2015-05-10 DIAGNOSIS — E041 Nontoxic single thyroid nodule: Secondary | ICD-10-CM

## 2015-05-10 DIAGNOSIS — E042 Nontoxic multinodular goiter: Secondary | ICD-10-CM | POA: Diagnosis not present

## 2015-05-13 DIAGNOSIS — E782 Mixed hyperlipidemia: Secondary | ICD-10-CM | POA: Diagnosis not present

## 2015-05-13 DIAGNOSIS — I10 Essential (primary) hypertension: Secondary | ICD-10-CM | POA: Diagnosis not present

## 2015-05-14 DIAGNOSIS — H02413 Mechanical ptosis of bilateral eyelids: Secondary | ICD-10-CM | POA: Diagnosis not present

## 2015-05-20 DIAGNOSIS — H02836 Dermatochalasis of left eye, unspecified eyelid: Secondary | ICD-10-CM | POA: Diagnosis not present

## 2015-05-20 DIAGNOSIS — H5340 Unspecified visual field defects: Secondary | ICD-10-CM | POA: Diagnosis not present

## 2015-05-20 DIAGNOSIS — H02833 Dermatochalasis of right eye, unspecified eyelid: Secondary | ICD-10-CM | POA: Diagnosis not present

## 2015-05-20 DIAGNOSIS — Z9889 Other specified postprocedural states: Secondary | ICD-10-CM | POA: Diagnosis not present

## 2015-06-13 ENCOUNTER — Other Ambulatory Visit: Payer: Self-pay | Admitting: Internal Medicine

## 2015-06-25 ENCOUNTER — Other Ambulatory Visit: Payer: Self-pay | Admitting: Internal Medicine

## 2015-06-26 ENCOUNTER — Other Ambulatory Visit: Payer: Self-pay

## 2015-06-26 ENCOUNTER — Ambulatory Visit: Payer: Medicare Other

## 2015-06-26 DIAGNOSIS — E119 Type 2 diabetes mellitus without complications: Secondary | ICD-10-CM

## 2015-06-26 DIAGNOSIS — E7800 Pure hypercholesterolemia, unspecified: Secondary | ICD-10-CM

## 2015-06-26 LAB — HEPATIC FUNCTION PANEL
ALK PHOS: 53 U/L (ref 39–117)
ALT: 15 U/L (ref 0–35)
AST: 15 U/L (ref 0–37)
Albumin: 3.9 g/dL (ref 3.5–5.2)
BILIRUBIN DIRECT: 0.1 mg/dL (ref 0.0–0.3)
Total Bilirubin: 0.6 mg/dL (ref 0.2–1.2)
Total Protein: 6.6 g/dL (ref 6.0–8.3)

## 2015-06-26 LAB — BASIC METABOLIC PANEL
BUN: 23 mg/dL (ref 6–23)
CHLORIDE: 100 meq/L (ref 96–112)
CO2: 32 meq/L (ref 19–32)
CREATININE: 0.91 mg/dL (ref 0.40–1.20)
Calcium: 9.2 mg/dL (ref 8.4–10.5)
GFR: 64.03 mL/min (ref >60.00)
Glucose, Bld: 119 mg/dL — ABNORMAL HIGH (ref 70–99)
Potassium: 4.4 mEq/L (ref 3.5–5.1)
Sodium: 139 mEq/L (ref 135–145)

## 2015-06-26 LAB — HEMOGLOBIN A1C: Hgb A1c MFr Bld: 6.5 % (ref 4.6–6.5)

## 2015-06-26 LAB — MICROALBUMIN / CREATININE URINE RATIO
Creatinine,U: 189.9 mg/dL
Microalb Creat Ratio: 0.4 mg/g (ref 0.0–30.0)
Microalb, Ur: 0.7 mg/dL (ref 0.0–1.9)

## 2015-06-26 LAB — LIPID PANEL
CHOLESTEROL: 247 mg/dL — AB (ref 0–200)
HDL: 51.8 mg/dL (ref >39.00)
LDL CALC: 168 mg/dL — AB (ref 0–99)
NonHDL: 195.65
TRIGLYCERIDES: 136 mg/dL (ref 0.0–149.0)
Total CHOL/HDL Ratio: 5
VLDL: 27.2 mg/dL (ref 0.0–40.0)

## 2015-06-26 LAB — TSH: TSH: 2.36 u[IU]/mL (ref 0.35–4.50)

## 2015-06-26 MED ORDER — GLUCOSE BLOOD VI STRP: ORAL_STRIP | Status: DC

## 2015-07-04 ENCOUNTER — Encounter: Payer: Self-pay | Admitting: Internal Medicine

## 2015-07-04 ENCOUNTER — Ambulatory Visit (INDEPENDENT_AMBULATORY_CARE_PROVIDER_SITE_OTHER): Payer: Medicare Other | Admitting: Internal Medicine

## 2015-07-04 VITALS — BP 110/80 | HR 69 | Temp 98.6°F | Resp 18 | Ht 64.0 in | Wt 198.4 lb

## 2015-07-04 DIAGNOSIS — E669 Obesity, unspecified: Secondary | ICD-10-CM

## 2015-07-04 DIAGNOSIS — K219 Gastro-esophageal reflux disease without esophagitis: Secondary | ICD-10-CM | POA: Diagnosis not present

## 2015-07-04 DIAGNOSIS — E119 Type 2 diabetes mellitus without complications: Secondary | ICD-10-CM

## 2015-07-04 DIAGNOSIS — R928 Other abnormal and inconclusive findings on diagnostic imaging of breast: Secondary | ICD-10-CM

## 2015-07-04 DIAGNOSIS — R221 Localized swelling, mass and lump, neck: Secondary | ICD-10-CM

## 2015-07-04 DIAGNOSIS — I10 Essential (primary) hypertension: Secondary | ICD-10-CM | POA: Diagnosis not present

## 2015-07-04 DIAGNOSIS — E7800 Pure hypercholesterolemia, unspecified: Secondary | ICD-10-CM

## 2015-07-04 MED ORDER — LANCETS MISC: Status: DC

## 2015-07-04 NOTE — Progress Notes (Signed)
Patient ID: Misty Jones, female   DOB: 1940-11-17, 74 y.o.   MRN: [id number]   Subjective:    Patient ID: Misty Jones, female    DOB: Nov 04, 1940, 74 y.o.   MRN: [id number]  HPI  Patient with past history of hypercholesterolemia, diabetes, GERD and hypertension.  She comes in today to follow up on these issues.  She is seeing Dr Pryor Ochoa for her thyroid.  Recommended f/u in 6 months.  Saw Dr Nehemiah Massed in 10/16.  Stable.  Tries to stay active.  No cardiac symptoms with increased activity or exertion.  No sob.  No increased cough or congestion.  No abdominal pain or cramping.  Bowels stable.  Handling stress.  Discussed cholesterol levels. She is intolerant of statin medication.  Discussed restarting niacin.  She tolerated.     Past Medical History  Diagnosis Date  . Diabetes mellitus ([place])   . Hypertension   . Hypercholesterolemia   . GERD (gastroesophageal reflux disease)   . B12 deficiency   . Chicken pox   . History of diverticulitis of colon    Past Surgical History  Procedure Laterality Date  . Back surgery  1986    ruptured disc, Dr Hardin Negus [facility])  . Cholecystectomy  1991  . Abdominal hysterectomy  1984  . Cholecystectomy  1992   Family History  Problem Relation Age of Onset  . Hypertension Mother   . Heart disease Father     myocardial infarction  . Raynaud syndrome Sister   . Thyroid disease Sister   . Asthma Sister   . Breast cancer Neg Hx   . Colon cancer Neg Hx    Social History   Social History  . Marital Status: Married    Spouse Name: N/A  . Number of Children: 2  . Years of Education: N/A   Social History Main Topics  . Smoking status: Former Research scientist (life sciences)  . Smokeless tobacco: Never Used     Comment: quit smoking years ago  . Alcohol Use: No  . Drug Use: No  . Sexual Activity: No   Other Topics Concern  . None   Social History Narrative    Outpatient Encounter Prescriptions as of 07/04/2015  Medication Sig  . aspirin 81 MG tablet Take 81 mg  by mouth daily.  . benazepril (LOTENSIN) 40 MG tablet TAKE ONE TABLET BY MOUTH ONCE DAILY  . beta carotene w/minerals (OCUVITE) tablet Take 1 tablet by mouth daily.  . Calcium Carbonate-Vitamin D (CALCIUM 600+D) 600-400 MG-UNIT per tablet Take 1 tablet by mouth 2 (two) times daily.  . Cholecalciferol (VITAMIN D) 2000 UNITS tablet Take 1,000 Units by mouth daily.   . cyanocobalamin (,VITAMIN B-12,) 1000 MCG/ML injection Inject 1 mL (1,000 mcg total) into the muscle every 30 (thirty) days.  . fish oil-omega-3 fatty acids 1000 MG capsule Take 1 g by mouth 2 (two) times daily.  Marland Kitchen glucose blood (BAYER CONTOUR TEST) test strip Use one blood strip to test blood sugars daily  . hydrochlorothiazide (HYDRODIURIL) 25 MG tablet TAKE ONE-HALF TABLET BY MOUTH ONCE DAILY  . metFORMIN (GLUCOPHAGE) 500 MG tablet TAKE ONE TABLET BY MOUTH ONCE DAILY  . omeprazole (PRILOSEC OTC) 20 MG tablet Take 20 mg by mouth daily.  . [DISCONTINUED] acyclovir (ZOVIRAX) 400 MG tablet Take tid x 5 days and then continue bid  . [DISCONTINUED] cephALEXin (KEFLEX) 500 MG capsule Take 1 capsule (500 mg total) by mouth 3 (three) times daily.  . [DISCONTINUED] niacin 500 MG tablet  Take 500 mg by mouth at bedtime.  . Lancets MISC Check sugars once a day Dx: E11.9 (Contour Next)   No facility-administered encounter medications on file as of 07/04/2015.    Review of Systems  Constitutional: Negative for appetite change and unexpected weight change.  HENT: Negative for congestion and sinus pressure.   Eyes: Negative for pain and discharge.  Respiratory: Negative for cough, chest tightness and shortness of breath.   Cardiovascular: Negative for chest pain, palpitations and leg swelling.  Gastrointestinal: Negative for nausea, abdominal pain and diarrhea.  Genitourinary: Negative for dysuria and difficulty urinating.  Musculoskeletal: Negative for back pain and joint swelling.  Skin: Negative for color change and rash.  Neurological:  Negative for dizziness, light-headedness and headaches.  Psychiatric/Behavioral: Negative for dysphoric mood and agitation.       Objective:    Physical Exam  Constitutional: She appears well-developed and well-nourished. No distress.  HENT:  Nose: Nose normal.  Mouth/Throat: Oropharynx is clear and moist.  Eyes: Conjunctivae are normal. Right eye exhibits no discharge. Left eye exhibits no discharge.  Neck: Neck supple. No thyromegaly present.  Cardiovascular: Normal rate and regular rhythm.   Pulmonary/Chest: Breath sounds normal. No respiratory distress. She has no wheezes.  Abdominal: Soft. Bowel sounds are normal. There is no tenderness.  Musculoskeletal: She exhibits no edema or tenderness.  Lymphadenopathy:    She has no cervical adenopathy.  Skin: No rash noted. No erythema.  Psychiatric: She has a normal mood and affect. Her behavior is normal.    BP 110/80 mmHg  Pulse 69  Temp(Src) 98.6 F (37 C) (Oral)  Resp 18  Ht _0  (1.626 m)  Wt 198 lb 6 oz (89.982 kg)  BMI 34.03 kg/m2  SpO2 97% Wt Readings from Last 3 Encounters:  07/04/15 198 lb 6 oz (89.982 kg)  04/03/15 195 lb 2 oz (88.508 kg)  03/01/15 192 lb (87.091 kg)     Lab Results  Component Value Date   WBC 8.7 03/01/2015   HGB 13.7 03/01/2015   HCT 41.1 03/01/2015   PLT 304.0 03/01/2015   GLUCOSE 119* 06/26/2015   CHOL 247* 06/26/2015   TRIG 136.0 06/26/2015   HDL 51.80 06/26/2015   LDLDIRECT 171.2 05/26/2013   LDLCALC 168* 06/26/2015   ALT 15 06/26/2015   AST 15 06/26/2015   NA 139 06/26/2015   K 4.4 06/26/2015   CL 100 06/26/2015   CREATININE 0.91 06/26/2015   BUN 23 06/26/2015   CO2 32 06/26/2015   TSH 2.36 06/26/2015   INR 0.9 04/03/2012   HGBA1C 6.5 06/26/2015   MICROALBUR 0.7 06/26/2015    US Soft Tissue Head/neck  04/30/2015  CLINICAL DATA:  74 year old female with a history of neck mass EXAM: THYROID ULTRASOUND TECHNIQUE: Ultrasound examination of the thyroid gland and adjacent  soft tissues was performed. COMPARISON:  CT 09/29/2011 FINDINGS: Right thyroid lobe Measurements: 5.1 cm x 2.0 cm x 1.8 cm. Heterogeneous appearance of the right-sided thyroid tissue without significantly increased flow. Hypoechoic nodules within the right thyroid with the largest measuring 10 mm x 6 mm x 7 mm inferiorly. No internal calcifications. Left thyroid lobe Measurements: 4.2 cm x 1.8 cm x 1.7 cm. Heterogeneous appearance of left-sided thyroid tissue without significant increased flow. Multiple small nodules with the largest inferiorly measuring 8 mm x 3 mm x 10 mm. No internal calcifications. Isthmus Thickness: 5 mm.  No nodules visualized. Lymphadenopathy Lymph nodes present, not enlarged by head and neck criteria. No focal fluid  collection or lesion identified. IMPRESSION: Multinodular thyroid, with none of the lesions meeting criteria for biopsy. Follow-up by clinical exam is recommended. If patient has known risk factors for thyroid carcinoma, consider follow-up ultrasound in 12 months. If patient is clinically hyperthyroid, consider nuclear medicine thyroid uptake and scan.Reference: Management of Thyroid Nodules Detected at [place]: Society of Radiologists in [place]. Radiology 2005; [id number]. No focal fluid collection or soft tissue lesion in the region of the thyroid. Signed, Dulcy Fanny. Earleen Newport, DO [facility] Electronically Signed   By: Corrie Mckusick D.O.   On: 04/30/2015 14:04       Assessment & Plan:   Problem List Items Addressed This Visit    Abnormal mammogram    Mammogram 04/12/15 - Birads I.        Diabetes mellitus ([place])    Sugars have been doing well.  Recent a1c 6.5.  Low carb diet and exercise.  Follow met b and a1c.        Relevant Orders   Hemoglobin A1c   GERD (gastroesophageal reflux disease)    On omeprazole.  Symptoms controlled.        Hypercholesterolemia    Low  cholesterol diet and exercise.  Follow lipid panel.  Unable to take statin medication.  Start niacin.  Follow.        Relevant Orders   Hepatic function panel   Lipid panel   Hypertension - Primary    Blood pressure under good control.  Continue same medication regimen.  Follow pressures.  Follow metabolic panel.        Relevant Orders   Basic metabolic panel   Neck nodule    Persistent right neck nodule.  Seeing ENT.  Had thyroid ultrasound.  Multinodular thyroid goiter.  Recommended f/u in 6 months.        Obesity (BMI 30-39.9)    Diet and exercise.  Follow.            [name], MD

## 2015-07-04 NOTE — Progress Notes (Signed)
Pre-visit discussion using our clinic review tool. No additional management support is needed unless otherwise documented below in the visit note.  

## 2015-07-07 ENCOUNTER — Encounter: Payer: Self-pay | Admitting: Internal Medicine

## 2015-07-07 NOTE — Assessment & Plan Note (Signed)
Blood pressure under good control.  Continue same medication regimen.  Follow pressures.  Follow metabolic panel.   

## 2015-07-07 NOTE — Assessment & Plan Note (Signed)
Sugars have been doing well.  Recent a1c 6.5.  Low carb diet and exercise.  Follow met b and a1c.

## 2015-07-07 NOTE — Assessment & Plan Note (Signed)
Persistent right neck nodule.  Seeing ENT.  Had thyroid ultrasound.  Multinodular thyroid goiter.  Recommended f/u in 6 months.

## 2015-07-07 NOTE — Assessment & Plan Note (Signed)
Low cholesterol diet and exercise.  Follow lipid panel.  Unable to take statin medication.  Start niacin.  Follow.

## 2015-07-07 NOTE — Assessment & Plan Note (Signed)
Diet and exercise.  Follow.  

## 2015-07-07 NOTE — Assessment & Plan Note (Signed)
Mammogram 04/12/15 - Birads I.

## 2015-07-07 NOTE — Assessment & Plan Note (Signed)
On omeprazole.  Symptoms controlled.  

## 2015-07-08 ENCOUNTER — Other Ambulatory Visit: Payer: Self-pay

## 2015-07-08 MED ORDER — GLUCOSE BLOOD VI STRP: ORAL_STRIP | Status: DC

## 2015-08-01 ENCOUNTER — Other Ambulatory Visit: Payer: Self-pay | Admitting: Internal Medicine

## 2015-08-13 ENCOUNTER — Other Ambulatory Visit: Payer: Self-pay | Admitting: Internal Medicine

## 2015-09-02 DIAGNOSIS — H5340 Unspecified visual field defects: Secondary | ICD-10-CM | POA: Diagnosis not present

## 2015-09-02 DIAGNOSIS — H02834 Dermatochalasis of left upper eyelid: Secondary | ICD-10-CM | POA: Diagnosis not present

## 2015-09-02 DIAGNOSIS — H02831 Dermatochalasis of right upper eyelid: Secondary | ICD-10-CM | POA: Diagnosis not present

## 2015-10-25 DIAGNOSIS — M545 Low back pain: Secondary | ICD-10-CM | POA: Diagnosis not present

## 2015-10-25 DIAGNOSIS — M25552 Pain in left hip: Secondary | ICD-10-CM | POA: Diagnosis not present

## 2015-10-25 DIAGNOSIS — M7062 Trochanteric bursitis, left hip: Secondary | ICD-10-CM | POA: Diagnosis not present

## 2015-10-25 DIAGNOSIS — G8929 Other chronic pain: Secondary | ICD-10-CM | POA: Diagnosis not present

## 2015-11-06 ENCOUNTER — Other Ambulatory Visit: Payer: Self-pay

## 2015-11-08 ENCOUNTER — Ambulatory Visit: Payer: Self-pay | Admitting: Internal Medicine

## 2015-11-08 ENCOUNTER — Ambulatory Visit: Payer: BLUE CROSS/BLUE SHIELD

## 2015-11-18 ENCOUNTER — Other Ambulatory Visit: Payer: Self-pay | Admitting: Internal Medicine

## 2015-11-18 ENCOUNTER — Ambulatory Visit
Admission: RE | Admit: 2015-11-18 | Discharge: 2015-11-18 | Disposition: A | Discharge status: Home / Self Care | Payer: Medicare Other | Source: Ambulatory Visit | Attending: Otolaryngology | Admitting: Otolaryngology

## 2015-11-18 DIAGNOSIS — E042 Nontoxic multinodular goiter: Secondary | ICD-10-CM | POA: Diagnosis present

## 2015-11-18 DIAGNOSIS — E041 Nontoxic single thyroid nodule: Secondary | ICD-10-CM | POA: Insufficient documentation

## 2015-12-04 ENCOUNTER — Encounter: Payer: Self-pay | Admitting: Internal Medicine

## 2015-12-04 ENCOUNTER — Ambulatory Visit (INDEPENDENT_AMBULATORY_CARE_PROVIDER_SITE_OTHER): Payer: Medicare Other | Admitting: Internal Medicine

## 2015-12-04 VITALS — BP 120/80 | HR 89 | Temp 97.9°F | Resp 17 | Ht 64.0 in | Wt 193.5 lb

## 2015-12-04 DIAGNOSIS — E7800 Pure hypercholesterolemia, unspecified: Secondary | ICD-10-CM | POA: Diagnosis not present

## 2015-12-04 DIAGNOSIS — R109 Unspecified abdominal pain: Secondary | ICD-10-CM | POA: Diagnosis not present

## 2015-12-04 DIAGNOSIS — E119 Type 2 diabetes mellitus without complications: Secondary | ICD-10-CM

## 2015-12-04 DIAGNOSIS — K219 Gastro-esophageal reflux disease without esophagitis: Secondary | ICD-10-CM

## 2015-12-04 DIAGNOSIS — I10 Essential (primary) hypertension: Secondary | ICD-10-CM

## 2015-12-04 DIAGNOSIS — M7062 Trochanteric bursitis, left hip: Secondary | ICD-10-CM

## 2015-12-04 DIAGNOSIS — E669 Obesity, unspecified: Secondary | ICD-10-CM

## 2015-12-04 LAB — LIPID PANEL
CHOL/HDL RATIO: 5
Cholesterol: 246 mg/dL — ABNORMAL HIGH (ref 0–200)
HDL: 52 mg/dL (ref >39.00)
LDL CALC: 163 mg/dL — AB (ref 0–99)
NONHDL: 193.88
Triglycerides: 156 mg/dL — ABNORMAL HIGH (ref 0.0–149.0)
VLDL: 31.2 mg/dL (ref 0.0–40.0)

## 2015-12-04 LAB — HEPATIC FUNCTION PANEL
ALK PHOS: 58 U/L (ref 39–117)
ALT: 13 U/L (ref 0–35)
AST: 16 U/L (ref 0–37)
Albumin: 4.1 g/dL (ref 3.5–5.2)
BILIRUBIN DIRECT: 0.1 mg/dL (ref 0.0–0.3)
Total Bilirubin: 0.5 mg/dL (ref 0.2–1.2)
Total Protein: 6.7 g/dL (ref 6.0–8.3)

## 2015-12-04 LAB — BASIC METABOLIC PANEL
BUN: 16 mg/dL (ref 6–23)
CHLORIDE: 99 meq/L (ref 96–112)
CO2: 32 mEq/L (ref 19–32)
Calcium: 9.5 mg/dL (ref 8.4–10.5)
Creatinine, Ser: 0.93 mg/dL (ref 0.40–1.20)
GFR: 62.37 mL/min (ref >60.00)
Glucose, Bld: 122 mg/dL — ABNORMAL HIGH (ref 70–99)
POTASSIUM: 4.5 meq/L (ref 3.5–5.1)
Sodium: 139 mEq/L (ref 135–145)

## 2015-12-04 LAB — HEMOGLOBIN A1C: HEMOGLOBIN A1C: 6.8 % — AB (ref 4.6–6.5)

## 2015-12-04 NOTE — Progress Notes (Signed)
Patient ID: Misty Jones, female   DOB: 03-Jul-1941, 75 y.o.   MRN: [id number]   Subjective:    Patient ID: Misty Jones, female    DOB: 06-24-1941, 75 y.o.   MRN: [id number]  HPI  Patient here for a scheduled follow up.  She reports that she started having abdominal pain over the last month.  Mostly localized - lower abdomen.  Recently noticed epigastric pain.  No significant bowel change - constipation/diarrha.  Occasional nausea.  No urinary symptoms.  On omeprazole.  Discussed zantac.  No chest pain.  No sob.  Saw ortho.  Diagnosed with left trochanteric bursitis.  Icing it and doing stretches.  Also saw ENT.  Had upper eyelid surgery.  Following with ENT.     Past Medical History  Diagnosis Date  . Diabetes mellitus ([place])   . Hypertension   . Hypercholesterolemia   . GERD (gastroesophageal reflux disease)   . B12 deficiency   . Chicken pox   . History of diverticulitis of colon    Past Surgical History  Procedure Laterality Date  . Back surgery  1986    ruptured disc, Dr Hardin Negus [facility])  . Cholecystectomy  1991  . Abdominal hysterectomy  1984  . Cholecystectomy  1992   Family History  Problem Relation Age of Onset  . Hypertension Mother   . Cancer Mother     Mouth cancer  . Heart disease Father     myocardial infarction  . Raynaud syndrome Sister   . Thyroid disease Sister   . Asthma Sister   . Breast cancer Neg Hx   . Colon cancer Neg Hx    Social History   Social History  . Marital Status: Married    Spouse Name: N/A  . Number of Children: 2  . Years of Education: N/A   Social History Main Topics  . Smoking status: Former Research scientist (life sciences)  . Smokeless tobacco: Never Used     Comment: quit smoking years ago  . Alcohol Use: No  . Drug Use: No  . Sexual Activity: No   Other Topics Concern  . None   Social History Narrative    Outpatient Encounter Prescriptions as of 12/04/2015  Medication Sig  . aspirin 81 MG tablet Take 81 mg by mouth daily.  .  benazepril (LOTENSIN) 40 MG tablet TAKE ONE TABLET BY MOUTH ONCE DAILY  . beta carotene w/minerals (OCUVITE) tablet Take 1 tablet by mouth daily.  . Calcium Carbonate-Vitamin D (CALCIUM 600+D) 600-400 MG-UNIT per tablet Take 1 tablet by mouth 2 (two) times daily.  . Cholecalciferol (VITAMIN D) 2000 UNITS tablet Take 1,000 Units by mouth daily.   . cyanocobalamin (,VITAMIN B-12,) 1000 MCG/ML injection INJECT 1 ML (1,000 MCG TOTAL) INTO THE MUSCLE EVERY 30 (THIRTY) DAYS.  . fish oil-omega-3 fatty acids 1000 MG capsule Take 1 g by mouth 2 (two) times daily.  Marland Kitchen glucose blood (BAYER CONTOUR TEST) test strip Use one blood strip to test blood sugars daily  . hydrochlorothiazide (HYDRODIURIL) 25 MG tablet TAKE ONE-HALF TABLET BY MOUTH ONCE DAILY  . Lancets MISC Check sugars once a day Dx: E11.9 (Contour Next)  . metFORMIN (GLUCOPHAGE) 500 MG tablet TAKE ONE TABLET BY MOUTH ONCE DAILY  . omeprazole (PRILOSEC OTC) 20 MG tablet Take 20 mg by mouth daily.   No facility-administered encounter medications on file as of 12/04/2015.    Review of Systems  Constitutional: Negative for appetite change and unexpected weight change.  HENT: Negative for  congestion and sinus pressure.   Respiratory: Negative for cough, chest tightness and shortness of breath.   Cardiovascular: Negative for chest pain, palpitations and leg swelling.  Gastrointestinal: Positive for nausea (occasional) and abdominal pain. Negative for vomiting and diarrhea.  Genitourinary: Negative for dysuria and difficulty urinating.  Musculoskeletal: Negative for back pain and joint swelling.  Skin: Negative for color change and rash.  Neurological: Negative for dizziness, light-headedness and headaches.  Psychiatric/Behavioral: Negative for dysphoric mood and agitation.       Objective:     Blood pressure rechecked by me:  122/82  Physical Exam  Constitutional: She appears well-developed and well-nourished. No distress.  HENT:  Nose:  Nose normal.  Mouth/Throat: Oropharynx is clear and moist.  Neck: Neck supple. No thyromegaly present.  Cardiovascular: Normal rate and regular rhythm.   Pulmonary/Chest: Breath sounds normal. No respiratory distress. She has no wheezes.  Abdominal: Soft. Bowel sounds are normal.  Minimal abdominal discomfort to palpation.   Musculoskeletal: She exhibits no edema or tenderness.  Lymphadenopathy:    She has no cervical adenopathy.  Skin: No rash noted. No erythema.  Psychiatric: She has a normal mood and affect. Her behavior is normal.    BP 120/80 mmHg  Pulse 89  Temp(Src) 97.9 F (36.6 C) (Oral)  Resp 17  Ht 5\' 4"  (1.626 m)  Wt 193 lb 8 oz (87.771 kg)  BMI 33.20 kg/m2  SpO2 97% Wt Readings from Last 3 Encounters:  12/12/15 198 lb 1.9 oz (89.867 kg)  12/04/15 193 lb 8 oz (87.771 kg)  07/04/15 198 lb 6 oz (89.982 kg)     Lab Results  Component Value Date   WBC 8.7 03/01/2015   HGB 13.7 03/01/2015   HCT 41.1 03/01/2015   PLT 304.0 03/01/2015   GLUCOSE 122* 12/04/2015   CHOL 246* 12/04/2015   TRIG 156.0* 12/04/2015   HDL 52.00 12/04/2015   LDLDIRECT 171.2 05/26/2013   LDLCALC 163* 12/04/2015   ALT 13 12/04/2015   AST 16 12/04/2015   NA 139 12/04/2015   K 4.5 12/04/2015   CL 99 12/04/2015   CREATININE 0.93 12/04/2015   BUN 16 12/04/2015   CO2 32 12/04/2015   TSH 2.36 06/26/2015   INR 0.9 04/03/2012   HGBA1C 6.8* 12/04/2015   MICROALBUR 0.7 06/26/2015    US Thyroid  11/18/2015  CLINICAL DATA:  Evaluate thyroid nodules. EXAM: THYROID ULTRASOUND TECHNIQUE: Ultrasound examination of the thyroid gland and adjacent soft tissues was performed. COMPARISON:  04/30/2015 FINDINGS: Right thyroid lobe Measurements: 4.0 x 1.5 x 1.5 cm. Right thyroid tissue is mildly heterogeneous. There is a subtle heterogeneous nodule in the posterior mid right thyroid lobe. This nodule measures 0.5 x 0.4 x 0.4 cm, previously measured 0.5 x 0.4 x 0.4 cm. No other discrete right thyroid  nodules. Left thyroid lobe Measurements: 4.1 x 1.8 x 1.9 cm. Left thyroid tissue is diffusely heterogeneous. There is not a discrete left thyroid nodule. Previously described nodule along the inferior left thyroid lobe appears to represent blood vessels on this examination. Isthmus Thickness: 0.6 cm.  No nodules visualized. Lymphadenopathy Prominent but normal-appearing lymph nodes on both sides of the neck. Index left neck lymph node measures 2.3 x 0.8 x 1.8 cm. Largest right lymph node measures 1.5 x 0.4 x 1.2 cm. IMPRESSION: Heterogeneous thyroid tissue. Only one discrete right thyroid nodule was identified and this nodule is stable measuring 0.5 cm in the right thyroid lobe. Electronically Signed   By: Scherrie Gerlach.D.  On: 11/18/2015 12:14       Assessment & Plan:   Problem List Items Addressed This Visit    Abdominal pain - Primary    Persistent symptoms as outlined.  Check CT abdomen and pelvis.  Follow.  May need referral to GI.  Is eating.  Bowels moving.  Continue omeprazole.        Relevant Orders   CT Abdomen Pelvis W Contrast (Completed)   Diabetes mellitus ([place])    Sugars doing well.  Check metabolic panel and [id number].  Low carb diet and exercise.        GERD (gastroesophageal reflux disease)    On omeprazole.  Symptoms controlled.        Hypercholesterolemia    Unable to take statin medications.  Low cholesterol diet and exercise.  Follow lipid panel.       Hypertension    Blood pressure under good control.  Continue same medication regimen.  Follow pressures.  Follow metabolic panel.        Obesity (BMI 30-39.9)    Diet and exercise.  Follow.       Trochanteric bursitis of left hip    Ice.  Stretches.  Seeing ortho.           [name], MD

## 2015-12-04 NOTE — Progress Notes (Signed)
Pre-visit discussion using our clinic review tool. No additional management support is needed unless otherwise documented below in the visit note.  

## 2015-12-11 ENCOUNTER — Ambulatory Visit: Payer: BLUE CROSS/BLUE SHIELD

## 2015-12-12 ENCOUNTER — Encounter: Payer: Self-pay | Admitting: *Deleted

## 2015-12-12 ENCOUNTER — Ambulatory Visit (INDEPENDENT_AMBULATORY_CARE_PROVIDER_SITE_OTHER): Payer: Medicare Other

## 2015-12-12 ENCOUNTER — Ambulatory Visit
Admission: RE | Admit: 2015-12-12 | Discharge: 2015-12-12 | Disposition: A | Discharge status: Home / Self Care | Payer: Medicare Other | Source: Ambulatory Visit | Attending: Internal Medicine | Admitting: Internal Medicine

## 2015-12-12 VITALS — BP 122/80 | HR 84 | Temp 97.4°F | Resp 14 | Ht 63.0 in | Wt 198.1 lb

## 2015-12-12 DIAGNOSIS — K5730 Diverticulosis of large intestine without perforation or abscess without bleeding: Secondary | ICD-10-CM | POA: Diagnosis not present

## 2015-12-12 DIAGNOSIS — Z0000 Encounter for general adult medical examination without abnormal findings: Secondary | ICD-10-CM | POA: Diagnosis not present

## 2015-12-12 DIAGNOSIS — R109 Unspecified abdominal pain: Secondary | ICD-10-CM | POA: Diagnosis not present

## 2015-12-12 DIAGNOSIS — M5137 Other intervertebral disc degeneration, lumbosacral region: Secondary | ICD-10-CM | POA: Insufficient documentation

## 2015-12-12 DIAGNOSIS — Z23 Encounter for immunization: Secondary | ICD-10-CM

## 2015-12-12 MED ORDER — IOPAMIDOL (ISOVUE-300) INJECTION 61%
100.0000 mL | Freq: Once | INTRAVENOUS | Status: AC | PRN
  Administered 2015-12-12: 100 mL via INTRAVENOUS

## 2015-12-12 NOTE — Progress Notes (Addendum)
Subjective:   GIORGIA CEJKA is a 75 y.o. female who presents for an Initial Medicare Annual Wellness Visit.  Review of Systems    No ROS.  Medicare Wellness Visit.  Cardiac Risk Factors include: advanced age (>38men, >12 women);diabetes mellitus;hypertension     Objective:    Today's Vitals   12/12/15 1015  BP: 122/80  Pulse: 84  Temp: 97.4 F (36.3 C)  TempSrc: Oral  Resp: 14  Height: 5\' 3"  (1.6 m)  Weight: 198 lb 1.9 oz (89.867 kg)  SpO2: 98%   Body mass index is 35.1 kg/(m^2).   Current Medications (verified) Outpatient Encounter Prescriptions as of 12/12/2015  Medication Sig  . aspirin 81 MG tablet Take 81 mg by mouth daily.  . benazepril (LOTENSIN) 40 MG tablet TAKE ONE TABLET BY MOUTH ONCE DAILY  . beta carotene w/minerals (OCUVITE) tablet Take 1 tablet by mouth daily.  . Calcium Carbonate-Vitamin D (CALCIUM 600+D) 600-400 MG-UNIT per tablet Take 1 tablet by mouth 2 (two) times daily.  . Cholecalciferol (VITAMIN D) 2000 UNITS tablet Take 1,000 Units by mouth daily.   . cyanocobalamin (,VITAMIN B-12,) 1000 MCG/ML injection INJECT 1 ML (1,000 MCG TOTAL) INTO THE MUSCLE EVERY 30 (THIRTY) DAYS.  . fish oil-omega-3 fatty acids 1000 MG capsule Take 1 g by mouth 2 (two) times daily.  Marland Kitchen glucose blood (BAYER CONTOUR TEST) test strip Use one blood strip to test blood sugars daily  . hydrochlorothiazide (HYDRODIURIL) 25 MG tablet TAKE ONE-HALF TABLET BY MOUTH ONCE DAILY  . Lancets MISC Check sugars once a day Dx: E11.9 (Contour Next)  . metFORMIN (GLUCOPHAGE) 500 MG tablet TAKE ONE TABLET BY MOUTH ONCE DAILY  . omeprazole (PRILOSEC OTC) 20 MG tablet Take 20 mg by mouth daily.   No facility-administered encounter medications on file as of 12/12/2015.    Allergies (verified) Augmentin   History: Past Medical History  Diagnosis Date  . Diabetes mellitus ([place])   . Hypertension   . Hypercholesterolemia   . GERD (gastroesophageal reflux disease)   . B12 deficiency   .  Chicken pox   . History of diverticulitis of colon    Past Surgical History  Procedure Laterality Date  . Back surgery  1986    ruptured disc, Dr Hardin Negus [facility])  . Cholecystectomy  1991  . Abdominal hysterectomy  1984  . Cholecystectomy  1992   Family History  Problem Relation Age of Onset  . Hypertension Mother   . Cancer Mother     Mouth cancer  . Heart disease Father     myocardial infarction  . Raynaud syndrome Sister   . Thyroid disease Sister   . Asthma Sister   . Breast cancer Neg Hx   . Colon cancer Neg Hx    Social History   Occupational History  . Not on file.   Social History Main Topics  . Smoking status: Former Research scientist (life sciences)  . Smokeless tobacco: Never Used     Comment: quit smoking years ago  . Alcohol Use: No  . Drug Use: No  . Sexual Activity: No    Tobacco Counseling Counseling given: Not Answered   Activities of Daily Living In your present state of health, do you have any difficulty performing the following activities: 12/12/2015  Hearing? N  Vision? N  Difficulty concentrating or making decisions? N  Walking or climbing stairs? N  Dressing or bathing? N  Doing errands, shopping? N  Preparing Food and eating ? N  Using the  Toilet? N  In the past six months, have you accidently leaked urine? N  Do you have problems with loss of bowel control? N  Managing your Medications? N  Managing your Finances? N  Housekeeping or managing your Housekeeping? N    Immunizations and Health Maintenance Immunization History  Administered Date(s) Administered  . Influenza Split 05/31/2012, 04/16/2014  . Influenza-Unspecified 05/08/2015  . Pneumococcal Conjugate-13 05/30/2013  . Pneumococcal Polysaccharide-23 12/12/2015  . Zoster 06/28/2012   There are no preventive care reminders to display for this patient.  Patient Care Team: [name], MD as PCP - General (Internal Medicine) [name], MD (Internal Medicine)  Indicate any recent  Medical Services you may have received from other than [facility] providers in the past year (date may be approximate).     Assessment:   This is a routine wellness examination for Jamey.  The goal of the wellness visit is to assist the patient how to close the gaps in care and create a preventative care plan for the patient.   Taking VIT D as appropriate/Osteoporosis risk reviewed.  Medications reviewed; taking without issues or barriers.  Safety issues reviewed; smoke detectors in the home. Firearms locked in a secure area within the home. Wears seatbelts when driving or riding with others. No violence in the home.  No identified risk were noted; The patient was oriented x 3; appropriate in dress and manner and no objective failures at ADL's or IADL's.   Pneumococcal Polysaccharide 23 vaccine administered, L deltoid. Tolerated well.  Health maintenance gap; closed.  Type 2 diabetes mell-stable and followed by PCP.  Patient Concerns:  None at this time.  Follow up with PCP as needed.  Hearing/Vision screen Hearing Screening Comments: Passed the whisper test Vision Screening Comments: Followed by [facility] Bilateral cataracts extracted; implant lenses present  No retinopathy Reports macular degeneration Last OV 03/2015 Annual visits   Dietary issues and exercise activities discussed: Current Exercise Habits: The patient does not participate in regular exercise at present  Goals    . Healthy Lifestyle     Stay hydrated and drink plenty of fluids/water. Low carb foods.  Lean meats and vegetables. Stay active and begin standing or chair exercises.  Educational material provided.  Walk 3 times a week, as tolerated..      Depression Screen PHQ 2/9 Scores 12/12/2015 04/03/2015 03/01/2015 02/26/2014 01/25/2013  PHQ - 2 Score 0 0 0 0 0    Fall Risk Fall Risk  12/12/2015 04/03/2015 03/01/2015 02/26/2014 01/25/2013  Falls in the past year? No No No No No    Cognitive Function: MMSE  - Mini Mental State Exam 12/12/2015  Orientation to time 5  Orientation to Place 5  Registration 3  Attention/ Calculation 5  Recall 3  Language- name 2 objects 2  Language- repeat 1  Language- follow 3 step command 3  Language- read & follow direction 1  Write a sentence 1  Copy design 1  Total score 30    Screening Tests Health Maintenance  Topic Date Due  . INFLUENZA VACCINE  02/25/2016  . OPHTHALMOLOGY EXAM  04/01/2016  . MAMMOGRAM  04/11/2016  . HEMOGLOBIN A1C  06/05/2016  . FOOT EXAM  12/03/2016  . TETANUS/TDAP  12/29/2020  . COLONOSCOPY  03/18/2021  . DEXA SCAN  Completed  . ZOSTAVAX  Completed  . PNA vac Low Risk Adult  Completed      Plan:   End of life planning; Advance aging; Advanced directives discussed. Copy  requested of current Living Will.  CALL INSURANCE REGARDING COVERAGE FOR EXERCISE/FIT PROGRAM.  Return in August for your physical.  Follow up with Dr. Nicki Reaper as needed.  During the course of the visit, Margarett was educated and counseled about the following appropriate screening and preventive services:   Vaccines to include Pneumoccal, Influenza, Hepatitis B, Td, Zostavax, HCV  Electrocardiogram  Cardiovascular disease screening  Colorectal cancer screening  Bone density screening  Diabetes screening  Glaucoma screening  Mammography/PAP  Nutrition counseling  Smoking cessation counseling  Patient Instructions (the written plan) were given to the patient.    Varney Biles, LPN   [id number]    Reviewed above information.  Agree with plan.    Dr Nicki Reaper

## 2015-12-12 NOTE — Patient Instructions (Addendum)
  Misty Jones , Thank you for taking time to come for your Medicare Wellness Visit. I appreciate your ongoing commitment to your health goals. Please review the following plan we discussed and let me know if I can assist you in the future.   CALL INSURANCE REGARDING COVERAGE FOR EXERCISE/FIT PROGRAM.  Return in August for your physical.  Follow up with Dr. Nicki Reaper as needed.   This is a list of the screening recommended for you and due dates:  Health Maintenance  Topic Date Due  . Tetanus Vaccine  05[birth year]02/1959  . Complete foot exam   02/21/2014  . Pneumonia vaccines (2 of 2 - PPSV23) 05/30/2014  . Eye exam for diabetics  01/09/2015  . Flu Shot  02/25/2016  . Mammogram  04/11/2016  . Hemoglobin A1C  06/05/2016  . Colon Cancer Screening  03/18/2021  . DEXA scan (bone density measurement)  Completed  . Shingles Vaccine  Completed

## 2015-12-20 DIAGNOSIS — M9903 Segmental and somatic dysfunction of lumbar region: Secondary | ICD-10-CM | POA: Diagnosis not present

## 2015-12-20 DIAGNOSIS — M955 Acquired deformity of pelvis: Secondary | ICD-10-CM | POA: Diagnosis not present

## 2015-12-20 DIAGNOSIS — M9905 Segmental and somatic dysfunction of pelvic region: Secondary | ICD-10-CM | POA: Diagnosis not present

## 2015-12-20 DIAGNOSIS — M5431 Sciatica, right side: Secondary | ICD-10-CM | POA: Diagnosis not present

## 2015-12-21 ENCOUNTER — Encounter: Payer: Self-pay | Admitting: Internal Medicine

## 2015-12-21 DIAGNOSIS — M7062 Trochanteric bursitis, left hip: Secondary | ICD-10-CM | POA: Insufficient documentation

## 2015-12-21 NOTE — Assessment & Plan Note (Signed)
Blood pressure under good control.  Continue same medication regimen.  Follow pressures.  Follow metabolic panel.   

## 2015-12-21 NOTE — Assessment & Plan Note (Signed)
On omeprazole.  Symptoms controlled.  

## 2015-12-21 NOTE — Assessment & Plan Note (Signed)
Diet and exercise.  Follow.  

## 2015-12-21 NOTE — Assessment & Plan Note (Signed)
Sugars doing well.  Check metabolic panel and A999333.  Low carb diet and exercise.

## 2015-12-21 NOTE — Assessment & Plan Note (Signed)
Ice.  Stretches.  Seeing ortho.

## 2015-12-21 NOTE — Assessment & Plan Note (Signed)
Persistent symptoms as outlined.  Check CT abdomen and pelvis.  Follow.  May need referral to GI.  Is eating.  Bowels moving.  Continue omeprazole.

## 2015-12-21 NOTE — Assessment & Plan Note (Signed)
Unable to take statin medications.  Low cholesterol diet and exercise.  Follow lipid panel.

## 2015-12-24 DIAGNOSIS — M9903 Segmental and somatic dysfunction of lumbar region: Secondary | ICD-10-CM | POA: Diagnosis not present

## 2015-12-24 DIAGNOSIS — M955 Acquired deformity of pelvis: Secondary | ICD-10-CM | POA: Diagnosis not present

## 2015-12-24 DIAGNOSIS — M9905 Segmental and somatic dysfunction of pelvic region: Secondary | ICD-10-CM | POA: Diagnosis not present

## 2015-12-24 DIAGNOSIS — M5431 Sciatica, right side: Secondary | ICD-10-CM | POA: Diagnosis not present

## 2015-12-25 DIAGNOSIS — M9905 Segmental and somatic dysfunction of pelvic region: Secondary | ICD-10-CM | POA: Diagnosis not present

## 2015-12-25 DIAGNOSIS — M9903 Segmental and somatic dysfunction of lumbar region: Secondary | ICD-10-CM | POA: Diagnosis not present

## 2015-12-25 DIAGNOSIS — M955 Acquired deformity of pelvis: Secondary | ICD-10-CM | POA: Diagnosis not present

## 2015-12-25 DIAGNOSIS — M5431 Sciatica, right side: Secondary | ICD-10-CM | POA: Diagnosis not present

## 2015-12-26 ENCOUNTER — Other Ambulatory Visit: Payer: Self-pay | Admitting: Internal Medicine

## 2015-12-26 DIAGNOSIS — M9903 Segmental and somatic dysfunction of lumbar region: Secondary | ICD-10-CM | POA: Diagnosis not present

## 2015-12-26 DIAGNOSIS — M5431 Sciatica, right side: Secondary | ICD-10-CM | POA: Diagnosis not present

## 2015-12-26 DIAGNOSIS — M955 Acquired deformity of pelvis: Secondary | ICD-10-CM | POA: Diagnosis not present

## 2015-12-26 DIAGNOSIS — M9905 Segmental and somatic dysfunction of pelvic region: Secondary | ICD-10-CM | POA: Diagnosis not present

## 2015-12-30 DIAGNOSIS — M5431 Sciatica, right side: Secondary | ICD-10-CM | POA: Diagnosis not present

## 2015-12-30 DIAGNOSIS — M9903 Segmental and somatic dysfunction of lumbar region: Secondary | ICD-10-CM | POA: Diagnosis not present

## 2015-12-30 DIAGNOSIS — M9905 Segmental and somatic dysfunction of pelvic region: Secondary | ICD-10-CM | POA: Diagnosis not present

## 2015-12-30 DIAGNOSIS — M955 Acquired deformity of pelvis: Secondary | ICD-10-CM | POA: Diagnosis not present

## 2016-01-01 DIAGNOSIS — M5431 Sciatica, right side: Secondary | ICD-10-CM | POA: Diagnosis not present

## 2016-01-01 DIAGNOSIS — M955 Acquired deformity of pelvis: Secondary | ICD-10-CM | POA: Diagnosis not present

## 2016-01-01 DIAGNOSIS — M9905 Segmental and somatic dysfunction of pelvic region: Secondary | ICD-10-CM | POA: Diagnosis not present

## 2016-01-01 DIAGNOSIS — M9903 Segmental and somatic dysfunction of lumbar region: Secondary | ICD-10-CM | POA: Diagnosis not present

## 2016-01-02 DIAGNOSIS — M5431 Sciatica, right side: Secondary | ICD-10-CM | POA: Diagnosis not present

## 2016-01-02 DIAGNOSIS — M955 Acquired deformity of pelvis: Secondary | ICD-10-CM | POA: Diagnosis not present

## 2016-01-02 DIAGNOSIS — M9905 Segmental and somatic dysfunction of pelvic region: Secondary | ICD-10-CM | POA: Diagnosis not present

## 2016-01-02 DIAGNOSIS — M9903 Segmental and somatic dysfunction of lumbar region: Secondary | ICD-10-CM | POA: Diagnosis not present

## 2016-01-06 DIAGNOSIS — M5431 Sciatica, right side: Secondary | ICD-10-CM | POA: Diagnosis not present

## 2016-01-06 DIAGNOSIS — M955 Acquired deformity of pelvis: Secondary | ICD-10-CM | POA: Diagnosis not present

## 2016-01-06 DIAGNOSIS — M9903 Segmental and somatic dysfunction of lumbar region: Secondary | ICD-10-CM | POA: Diagnosis not present

## 2016-01-06 DIAGNOSIS — M9905 Segmental and somatic dysfunction of pelvic region: Secondary | ICD-10-CM | POA: Diagnosis not present

## 2016-01-08 DIAGNOSIS — M9905 Segmental and somatic dysfunction of pelvic region: Secondary | ICD-10-CM | POA: Diagnosis not present

## 2016-01-08 DIAGNOSIS — M9903 Segmental and somatic dysfunction of lumbar region: Secondary | ICD-10-CM | POA: Diagnosis not present

## 2016-01-08 DIAGNOSIS — M955 Acquired deformity of pelvis: Secondary | ICD-10-CM | POA: Diagnosis not present

## 2016-01-08 DIAGNOSIS — M5431 Sciatica, right side: Secondary | ICD-10-CM | POA: Diagnosis not present

## 2016-01-09 DIAGNOSIS — M9903 Segmental and somatic dysfunction of lumbar region: Secondary | ICD-10-CM | POA: Diagnosis not present

## 2016-01-09 DIAGNOSIS — M955 Acquired deformity of pelvis: Secondary | ICD-10-CM | POA: Diagnosis not present

## 2016-01-09 DIAGNOSIS — M5431 Sciatica, right side: Secondary | ICD-10-CM | POA: Diagnosis not present

## 2016-01-09 DIAGNOSIS — M9905 Segmental and somatic dysfunction of pelvic region: Secondary | ICD-10-CM | POA: Diagnosis not present

## 2016-01-13 DIAGNOSIS — M955 Acquired deformity of pelvis: Secondary | ICD-10-CM | POA: Diagnosis not present

## 2016-01-13 DIAGNOSIS — M5431 Sciatica, right side: Secondary | ICD-10-CM | POA: Diagnosis not present

## 2016-01-13 DIAGNOSIS — M9905 Segmental and somatic dysfunction of pelvic region: Secondary | ICD-10-CM | POA: Diagnosis not present

## 2016-01-13 DIAGNOSIS — M9903 Segmental and somatic dysfunction of lumbar region: Secondary | ICD-10-CM | POA: Diagnosis not present

## 2016-01-15 DIAGNOSIS — M9905 Segmental and somatic dysfunction of pelvic region: Secondary | ICD-10-CM | POA: Diagnosis not present

## 2016-01-15 DIAGNOSIS — M955 Acquired deformity of pelvis: Secondary | ICD-10-CM | POA: Diagnosis not present

## 2016-01-15 DIAGNOSIS — M5431 Sciatica, right side: Secondary | ICD-10-CM | POA: Diagnosis not present

## 2016-01-15 DIAGNOSIS — M9903 Segmental and somatic dysfunction of lumbar region: Secondary | ICD-10-CM | POA: Diagnosis not present

## 2016-01-16 DIAGNOSIS — M9903 Segmental and somatic dysfunction of lumbar region: Secondary | ICD-10-CM | POA: Diagnosis not present

## 2016-01-16 DIAGNOSIS — M9905 Segmental and somatic dysfunction of pelvic region: Secondary | ICD-10-CM | POA: Diagnosis not present

## 2016-01-16 DIAGNOSIS — M5431 Sciatica, right side: Secondary | ICD-10-CM | POA: Diagnosis not present

## 2016-01-16 DIAGNOSIS — M955 Acquired deformity of pelvis: Secondary | ICD-10-CM | POA: Diagnosis not present

## 2016-01-21 DIAGNOSIS — M9905 Segmental and somatic dysfunction of pelvic region: Secondary | ICD-10-CM | POA: Diagnosis not present

## 2016-01-21 DIAGNOSIS — M5431 Sciatica, right side: Secondary | ICD-10-CM | POA: Diagnosis not present

## 2016-01-21 DIAGNOSIS — M955 Acquired deformity of pelvis: Secondary | ICD-10-CM | POA: Diagnosis not present

## 2016-01-21 DIAGNOSIS — M9903 Segmental and somatic dysfunction of lumbar region: Secondary | ICD-10-CM | POA: Diagnosis not present

## 2016-01-23 DIAGNOSIS — M5431 Sciatica, right side: Secondary | ICD-10-CM | POA: Diagnosis not present

## 2016-01-23 DIAGNOSIS — M955 Acquired deformity of pelvis: Secondary | ICD-10-CM | POA: Diagnosis not present

## 2016-01-23 DIAGNOSIS — M9905 Segmental and somatic dysfunction of pelvic region: Secondary | ICD-10-CM | POA: Diagnosis not present

## 2016-01-23 DIAGNOSIS — M9903 Segmental and somatic dysfunction of lumbar region: Secondary | ICD-10-CM | POA: Diagnosis not present

## 2016-02-03 DIAGNOSIS — M9905 Segmental and somatic dysfunction of pelvic region: Secondary | ICD-10-CM | POA: Diagnosis not present

## 2016-02-03 DIAGNOSIS — M9903 Segmental and somatic dysfunction of lumbar region: Secondary | ICD-10-CM | POA: Diagnosis not present

## 2016-02-03 DIAGNOSIS — M955 Acquired deformity of pelvis: Secondary | ICD-10-CM | POA: Diagnosis not present

## 2016-02-03 DIAGNOSIS — M5431 Sciatica, right side: Secondary | ICD-10-CM | POA: Diagnosis not present

## 2016-02-05 DIAGNOSIS — M9903 Segmental and somatic dysfunction of lumbar region: Secondary | ICD-10-CM | POA: Diagnosis not present

## 2016-02-05 DIAGNOSIS — M9905 Segmental and somatic dysfunction of pelvic region: Secondary | ICD-10-CM | POA: Diagnosis not present

## 2016-02-05 DIAGNOSIS — M5431 Sciatica, right side: Secondary | ICD-10-CM | POA: Diagnosis not present

## 2016-02-05 DIAGNOSIS — M955 Acquired deformity of pelvis: Secondary | ICD-10-CM | POA: Diagnosis not present

## 2016-02-06 DIAGNOSIS — M5431 Sciatica, right side: Secondary | ICD-10-CM | POA: Diagnosis not present

## 2016-02-06 DIAGNOSIS — M9903 Segmental and somatic dysfunction of lumbar region: Secondary | ICD-10-CM | POA: Diagnosis not present

## 2016-02-06 DIAGNOSIS — M9905 Segmental and somatic dysfunction of pelvic region: Secondary | ICD-10-CM | POA: Diagnosis not present

## 2016-02-06 DIAGNOSIS — M955 Acquired deformity of pelvis: Secondary | ICD-10-CM | POA: Diagnosis not present

## 2016-02-10 DIAGNOSIS — M9905 Segmental and somatic dysfunction of pelvic region: Secondary | ICD-10-CM | POA: Diagnosis not present

## 2016-02-10 DIAGNOSIS — M955 Acquired deformity of pelvis: Secondary | ICD-10-CM | POA: Diagnosis not present

## 2016-02-10 DIAGNOSIS — M9903 Segmental and somatic dysfunction of lumbar region: Secondary | ICD-10-CM | POA: Diagnosis not present

## 2016-02-10 DIAGNOSIS — M5431 Sciatica, right side: Secondary | ICD-10-CM | POA: Diagnosis not present

## 2016-02-13 DIAGNOSIS — M5431 Sciatica, right side: Secondary | ICD-10-CM | POA: Diagnosis not present

## 2016-02-13 DIAGNOSIS — M955 Acquired deformity of pelvis: Secondary | ICD-10-CM | POA: Diagnosis not present

## 2016-02-13 DIAGNOSIS — M9903 Segmental and somatic dysfunction of lumbar region: Secondary | ICD-10-CM | POA: Diagnosis not present

## 2016-02-13 DIAGNOSIS — M9905 Segmental and somatic dysfunction of pelvic region: Secondary | ICD-10-CM | POA: Diagnosis not present

## 2016-02-17 DIAGNOSIS — M955 Acquired deformity of pelvis: Secondary | ICD-10-CM | POA: Diagnosis not present

## 2016-02-17 DIAGNOSIS — M9903 Segmental and somatic dysfunction of lumbar region: Secondary | ICD-10-CM | POA: Diagnosis not present

## 2016-02-17 DIAGNOSIS — M9905 Segmental and somatic dysfunction of pelvic region: Secondary | ICD-10-CM | POA: Diagnosis not present

## 2016-02-17 DIAGNOSIS — M5431 Sciatica, right side: Secondary | ICD-10-CM | POA: Diagnosis not present

## 2016-02-18 ENCOUNTER — Other Ambulatory Visit: Payer: Self-pay | Admitting: Internal Medicine

## 2016-02-20 DIAGNOSIS — M9905 Segmental and somatic dysfunction of pelvic region: Secondary | ICD-10-CM | POA: Diagnosis not present

## 2016-02-20 DIAGNOSIS — M5431 Sciatica, right side: Secondary | ICD-10-CM | POA: Diagnosis not present

## 2016-02-20 DIAGNOSIS — M9903 Segmental and somatic dysfunction of lumbar region: Secondary | ICD-10-CM | POA: Diagnosis not present

## 2016-02-20 DIAGNOSIS — M955 Acquired deformity of pelvis: Secondary | ICD-10-CM | POA: Diagnosis not present

## 2016-02-26 DIAGNOSIS — M9903 Segmental and somatic dysfunction of lumbar region: Secondary | ICD-10-CM | POA: Diagnosis not present

## 2016-02-26 DIAGNOSIS — M5431 Sciatica, right side: Secondary | ICD-10-CM | POA: Diagnosis not present

## 2016-02-26 DIAGNOSIS — M9905 Segmental and somatic dysfunction of pelvic region: Secondary | ICD-10-CM | POA: Diagnosis not present

## 2016-02-26 DIAGNOSIS — M955 Acquired deformity of pelvis: Secondary | ICD-10-CM | POA: Diagnosis not present

## 2016-03-04 DIAGNOSIS — M5431 Sciatica, right side: Secondary | ICD-10-CM | POA: Diagnosis not present

## 2016-03-04 DIAGNOSIS — M9903 Segmental and somatic dysfunction of lumbar region: Secondary | ICD-10-CM | POA: Diagnosis not present

## 2016-03-04 DIAGNOSIS — M9905 Segmental and somatic dysfunction of pelvic region: Secondary | ICD-10-CM | POA: Diagnosis not present

## 2016-03-04 DIAGNOSIS — M955 Acquired deformity of pelvis: Secondary | ICD-10-CM | POA: Diagnosis not present

## 2016-03-10 DIAGNOSIS — M9905 Segmental and somatic dysfunction of pelvic region: Secondary | ICD-10-CM | POA: Diagnosis not present

## 2016-03-10 DIAGNOSIS — M5431 Sciatica, right side: Secondary | ICD-10-CM | POA: Diagnosis not present

## 2016-03-10 DIAGNOSIS — M955 Acquired deformity of pelvis: Secondary | ICD-10-CM | POA: Diagnosis not present

## 2016-03-10 DIAGNOSIS — M9903 Segmental and somatic dysfunction of lumbar region: Secondary | ICD-10-CM | POA: Diagnosis not present

## 2016-03-19 DIAGNOSIS — M9905 Segmental and somatic dysfunction of pelvic region: Secondary | ICD-10-CM | POA: Diagnosis not present

## 2016-03-19 DIAGNOSIS — M9903 Segmental and somatic dysfunction of lumbar region: Secondary | ICD-10-CM | POA: Diagnosis not present

## 2016-03-19 DIAGNOSIS — M955 Acquired deformity of pelvis: Secondary | ICD-10-CM | POA: Diagnosis not present

## 2016-03-19 DIAGNOSIS — M5431 Sciatica, right side: Secondary | ICD-10-CM | POA: Diagnosis not present

## 2016-03-25 ENCOUNTER — Encounter: Payer: Self-pay | Admitting: Internal Medicine

## 2016-03-25 ENCOUNTER — Ambulatory Visit (INDEPENDENT_AMBULATORY_CARE_PROVIDER_SITE_OTHER): Payer: Medicare Other | Admitting: Internal Medicine

## 2016-03-25 VITALS — BP 136/84 | HR 72 | Temp 98.0°F | Resp 16 | Ht 63.5 in | Wt 200.0 lb

## 2016-03-25 DIAGNOSIS — Z23 Encounter for immunization: Secondary | ICD-10-CM | POA: Diagnosis not present

## 2016-03-25 DIAGNOSIS — Z0000 Encounter for general adult medical examination without abnormal findings: Secondary | ICD-10-CM

## 2016-03-25 DIAGNOSIS — K219 Gastro-esophageal reflux disease without esophagitis: Secondary | ICD-10-CM

## 2016-03-25 DIAGNOSIS — Z1239 Encounter for other screening for malignant neoplasm of breast: Secondary | ICD-10-CM | POA: Diagnosis not present

## 2016-03-25 DIAGNOSIS — E669 Obesity, unspecified: Secondary | ICD-10-CM

## 2016-03-25 DIAGNOSIS — I10 Essential (primary) hypertension: Secondary | ICD-10-CM

## 2016-03-25 DIAGNOSIS — E7800 Pure hypercholesterolemia, unspecified: Secondary | ICD-10-CM

## 2016-03-25 DIAGNOSIS — E119 Type 2 diabetes mellitus without complications: Secondary | ICD-10-CM | POA: Diagnosis not present

## 2016-03-25 LAB — BASIC METABOLIC PANEL
BUN: 14 mg/dL (ref 6–23)
CHLORIDE: 103 meq/L (ref 96–112)
CO2: 34 meq/L — AB (ref 19–32)
CREATININE: 0.85 mg/dL (ref 0.40–1.20)
Calcium: 8.8 mg/dL (ref 8.4–10.5)
GFR: 69.13 mL/min (ref >60.00)
GLUCOSE: 123 mg/dL — AB (ref 70–99)
POTASSIUM: 4.7 meq/L (ref 3.5–5.1)
Sodium: 142 mEq/L (ref 135–145)

## 2016-03-25 LAB — CBC WITH DIFFERENTIAL/PLATELET
Basophils Absolute: 0 10*3/uL (ref 0.0–0.1)
Basophils Relative: 0.4 % (ref 0.0–3.0)
EOS PCT: 1.8 % (ref 0.0–5.0)
Eosinophils Absolute: 0.1 10*3/uL (ref 0.0–0.7)
HCT: 38.5 % (ref 36.0–46.0)
HEMOGLOBIN: 12.9 g/dL (ref 12.0–15.0)
LYMPHS PCT: 34.7 % (ref 12.0–46.0)
Lymphs Abs: 2.2 10*3/uL (ref 0.7–4.0)
MCHC: 33.5 g/dL (ref 30.0–36.0)
MCV: 91.3 fl (ref 78.0–100.0)
MONO ABS: 0.3 10*3/uL (ref 0.1–1.0)
Monocytes Relative: 5.4 % (ref 3.0–12.0)
Neutro Abs: 3.7 10*3/uL (ref 1.4–7.7)
Neutrophils Relative %: 57.7 % (ref 43.0–77.0)
Platelets: 290 10*3/uL (ref 150.0–400.0)
RBC: 4.22 Mil/uL (ref 3.87–5.11)
RDW: 14.3 % (ref 11.5–15.5)
WBC: 6.4 10*3/uL (ref 4.0–10.5)

## 2016-03-25 LAB — HEPATIC FUNCTION PANEL
ALBUMIN: 3.9 g/dL (ref 3.5–5.2)
ALT: 15 U/L (ref 0–35)
AST: 15 U/L (ref 0–37)
Alkaline Phosphatase: 52 U/L (ref 39–117)
Bilirubin, Direct: 0 mg/dL (ref 0.0–0.3)
TOTAL PROTEIN: 6.4 g/dL (ref 6.0–8.3)
Total Bilirubin: 0.4 mg/dL (ref 0.2–1.2)

## 2016-03-25 LAB — LIPID PANEL
CHOLESTEROL: 244 mg/dL — AB (ref 0–200)
HDL: 51.2 mg/dL (ref >39.00)
LDL CALC: 161 mg/dL — AB (ref 0–99)
NonHDL: 192.67
TRIGLYCERIDES: 160 mg/dL — AB (ref 0.0–149.0)
Total CHOL/HDL Ratio: 5
VLDL: 32 mg/dL (ref 0.0–40.0)

## 2016-03-25 LAB — HEMOGLOBIN A1C: Hgb A1c MFr Bld: 6.5 % (ref 4.6–6.5)

## 2016-03-25 NOTE — Assessment & Plan Note (Signed)
Unable to take statin medications.  Follow lipid panel and liver function tests.  Low cholesterol diet and exercise.

## 2016-03-25 NOTE — Assessment & Plan Note (Signed)
Blood pressure under good control.  Continue same medication regimen.  Follow pressures.  Follow metabolic panel.   

## 2016-03-25 NOTE — Progress Notes (Signed)
Patient ID: Misty Jones, female   DOB: 1941-04-03, 75 y.o.   MRN: [id number]   Subjective:    Patient ID: Misty Jones, female    DOB: 11/10/1940, 75 y.o.   MRN: [id number]  HPI  Patient with past history of hypercholesterolemia, diabetes, GERD and hypertension.  She comes in today to follow up on these issues.  She is doing well.  Does have some nasal congestion.  Increased headache related to the congestion.  Headaches resolves as soon as she clears the congestion.  No chest pain.  No sob.  No acid reflux reported.  No abdominal pain or cramping. Bowels stable.   Planning to go on a cruise.  Has had a blood vessel to burst on two occasions.  One just occurred a few days ago.  Desires no further intervention.  Discussed compression hose.  Blood sugars averaging 90-130.     Past Medical History:  Diagnosis Date  . B12 deficiency   . Chicken pox   . Diabetes mellitus ([place])   . GERD (gastroesophageal reflux disease)   . History of diverticulitis of colon   . Hypercholesterolemia   . Hypertension    Past Surgical History:  Procedure Laterality Date  . ABDOMINAL HYSTERECTOMY  1984  . BACK SURGERY  1986   ruptured disc, Dr Hardin Negus [facility])  . CHOLECYSTECTOMY  1991  . CHOLECYSTECTOMY  1992   Family History  Problem Relation Age of Onset  . Hypertension Mother   . Cancer Mother     Mouth cancer  . Heart disease Father     myocardial infarction  . Raynaud syndrome Sister   . Thyroid disease Sister   . Asthma Sister   . Breast cancer Neg Hx   . Colon cancer Neg Hx    Social History   Social History  . Marital status: Divorced    Spouse name: N/A  . Number of children: 2  . Years of education: N/A   Occupational History  . retired    Social History Main Topics  . Smoking status: Former Smoker    Packs/day: 0.50    Years: 20.00    Quit date: 07/26/1984  . Smokeless tobacco: Never Used     Comment: quit smoking years ago  . Alcohol use No  . Drug use: No  .  Sexual activity: No   Other Topics Concern  . None   Social History Narrative  . None    Outpatient Encounter Prescriptions as of 03/25/2016  Medication Sig  . aspirin 81 MG tablet Take 81 mg by mouth daily.  . benazepril (LOTENSIN) 40 MG tablet TAKE ONE TABLET BY MOUTH ONCE DAILY  . beta carotene w/minerals (OCUVITE) tablet Take 1 tablet by mouth daily.  . Calcium Carbonate-Vitamin D (CALCIUM 600+D) 600-400 MG-UNIT per tablet Take 1 tablet by mouth 2 (two) times daily.  . Cholecalciferol (VITAMIN D) 2000 UNITS tablet Take 1,000 Units by mouth daily.   . cyanocobalamin (,VITAMIN B-12,) 1000 MCG/ML injection INJECT 1 ML (1,000 MCG TOTAL) INTO THE MUSCLE EVERY 30 (THIRTY) DAYS.  . fish oil-omega-3 fatty acids 1000 MG capsule Take 1 g by mouth 2 (two) times daily.  Marland Kitchen glucose blood (BAYER CONTOUR TEST) test strip Use one blood strip to test blood sugars daily  . hydrochlorothiazide (HYDRODIURIL) 25 MG tablet TAKE ONE-HALF TABLET BY MOUTH ONCE DAILY  . Lancets MISC Check sugars once a day Dx: E11.9 (Contour Next)  . metFORMIN (GLUCOPHAGE) 500 MG tablet TAKE ONE  TABLET BY MOUTH ONCE DAILY  . niacin 100 MG tablet Take 100 mg by mouth at bedtime.  Marland Kitchen omeprazole (PRILOSEC OTC) 20 MG tablet Take 20 mg by mouth daily.   No facility-administered encounter medications on file as of 03/25/2016.     Review of Systems  Constitutional: Negative for appetite change and unexpected weight change.  HENT: Positive for congestion and sinus pressure.   Eyes: Negative for pain and visual disturbance.  Respiratory: Negative for cough, chest tightness and shortness of breath.   Cardiovascular: Negative for chest pain, palpitations and leg swelling.  Gastrointestinal: Negative for abdominal pain, diarrhea, nausea and vomiting.  Genitourinary: Negative for difficulty urinating and dysuria.  Musculoskeletal: Negative for back pain and joint swelling.  Skin: Negative for color change and rash.  Neurological:  Negative for dizziness, light-headedness and headaches.  Hematological: Negative for adenopathy. Does not bruise/bleed easily.  Psychiatric/Behavioral: Negative for agitation and dysphoric mood.       Objective:     Blood pressure rechecked by me:  130/82  Physical Exam  Constitutional: She is oriented to person, place, and time. She appears well-developed and well-nourished. No distress.  HENT:  Nose: Nose normal.  Mouth/Throat: Oropharynx is clear and moist.  Eyes: Right eye exhibits no discharge. Left eye exhibits no discharge. No scleral icterus.  Neck: Neck supple. No thyromegaly present.  Cardiovascular: Normal rate and regular rhythm.   Pulmonary/Chest: Breath sounds normal. No accessory muscle usage. No tachypnea. No respiratory distress. She has no decreased breath sounds. She has no wheezes. She has no rhonchi. Right breast exhibits no inverted nipple, no mass, no nipple discharge and no tenderness (no axillary adenopathy). Left breast exhibits no inverted nipple, no mass, no nipple discharge and no tenderness (no axilarry adenopathy).  DP pulses palpable and equal bilaterally.   Abdominal: Soft. Bowel sounds are normal. There is no tenderness.  Musculoskeletal: She exhibits no edema or tenderness.  Lymphadenopathy:    She has no cervical adenopathy.  Neurological: She is alert and oriented to person, place, and time.  Skin: Skin is warm. No rash noted. No erythema.  Psychiatric: She has a normal mood and affect. Her behavior is normal.    BP 136/84 (BP Location: Right Arm, Patient Position: Sitting, Cuff Size: Large) Comment: has not taken BP medication today  Pulse 72   Temp 98 F (36.7 C) (Oral)   Resp 16   Ht 5' 3.5" (1.613 m)   Wt 200 lb (90.7 kg)   BMI 34.87 kg/m  Wt Readings from Last 3 Encounters:  03/25/16 200 lb (90.7 kg)  12/12/15 198 lb 1.9 oz (89.9 kg)  12/04/15 193 lb 8 oz (87.8 kg)     Lab Results  Component Value Date   WBC 8.7 03/01/2015    HGB 13.7 03/01/2015   HCT 41.1 03/01/2015   PLT 304.0 03/01/2015   GLUCOSE 122 (H) 12/04/2015   CHOL 246 (H) 12/04/2015   TRIG 156.0 (H) 12/04/2015   HDL 52.00 12/04/2015   LDLDIRECT 171.2 05/26/2013   LDLCALC 163 (H) 12/04/2015   ALT 13 12/04/2015   AST 16 12/04/2015   NA 139 12/04/2015   K 4.5 12/04/2015   CL 99 12/04/2015   CREATININE 0.93 12/04/2015   BUN 16 12/04/2015   CO2 32 12/04/2015   TSH 2.36 06/26/2015   INR 0.9 04/03/2012   HGBA1C 6.8 (H) 12/04/2015   MICROALBUR 0.7 06/26/2015    Ct Abdomen Pelvis W Contrast  Result Date: 12/12/2015 CLINICAL DATA:  Persistent abdominal and lower pelvic pain, nausea EXAM: CT ABDOMEN AND PELVIS WITH CONTRAST TECHNIQUE: Multidetector CT imaging of the abdomen and pelvis was performed using the standard protocol following bolus administration of intravenous contrast. CONTRAST:  18m ISOVUE-300 IOPAMIDOL (ISOVUE-300) INJECTION 61% COMPARISON:  None. FINDINGS: The lung bases are clear. The liver enhances with no focal abnormality and no ductal dilatation is seen. Surgical clips are present from prior cholecystectomy. The pancreas is normal in size and the pancreatic duct is not dilated. The adrenal glands and spleen are unremarkable other than multiple calcified splenic granulomas from prior granulomatous disease. The kidneys enhance and no calculi are seen. Minimal prominence of the pelves of the kidneys is noted with no hydronephrosis is seen. The proximal ureters are normal in caliber. Small mesenteric and retroperitoneal lymph nodes are present none of which are pathologically enlarged. The abdominal aorta is normal in caliber. The distal ureters are normal in caliber and no distal ureteral calculus is seen. The urinary bladder is unremarkable although not well distended. The uterus has previously been resected. No adnexal lesion is seen. No fluid is noted within the pelvis. There are multiple rectosigmoid colon diverticula present. Diverticula  also are scattered throughout the more proximal colon. The terminal ileum and the appendix are unremarkable. There is anterolisthesis of L5 on S1 by approximately 12 mm with pars defects bilaterally at L5. Degenerative disc disease is most marked at the L5-S1 level. IMPRESSION: 1. No renal or ureteral calculi are noted. No explanation for the patient's abdominal and pelvic pain is seen. 2. Multiple colonic diverticula throughout the colon concentrated primarily within the rectosigmoid colon. No diverticulitis. 3. 12 mm anterolisthesis of L5 on S1 with bilateral pars defects at L5 and degenerative disc disease at L5-S1. Electronically Signed   By: PIvar DrapeM.D.   On: 12/12/2015 10:48       Assessment & Plan:   Problem List Items Addressed This Visit    Diabetes mellitus ([place]    Sugars as outlined.  Follow met b and a1c.  Low carb diet and exercise.  Up to date with eye exam.       Relevant Orders   Hemoglobin A1c   GERD (gastroesophageal reflux disease)    On omeprazole.  Controlled.        Health care maintenance    Physical today 03/25/16.  Mammogram 04/12/15 - birads I.  Schedule f/u mammogram.  Colonoscopy 03/19/11.  Recommended f/u colonoscopy in five years.  Colonoscopy consultation scheduled for 04/10/16.        Hypercholesterolemia    Unable to take statin medications.  Follow lipid panel and liver function tests.  Low cholesterol diet and exercise.        Relevant Orders   Lipid panel   Hepatic function panel   Hypertension    Blood pressure under good control.  Continue same medication regimen.  Follow pressures.  Follow metabolic panel.        Relevant Orders   CBC with Differential/Platelet   Basic metabolic panel   Obesity (BMI 30-39.9)    Diet and exercise.  Follow.        Other Visit Diagnoses    Breast cancer screening    -  Primary   Relevant Orders   MM DIGITAL SCREENING BILATERAL       SEinar Pheasant MD

## 2016-03-25 NOTE — Assessment & Plan Note (Addendum)
Physical today 03/25/16.  Mammogram 04/12/15 - birads I.  Schedule f/u mammogram.  Colonoscopy 03/19/11.  Recommended f/u colonoscopy in five years.  Colonoscopy consultation scheduled for 04/10/16.

## 2016-03-25 NOTE — Assessment & Plan Note (Signed)
Diet and exercise.  Follow.  

## 2016-03-25 NOTE — Assessment & Plan Note (Signed)
On omeprazole.  Controlled.   

## 2016-03-25 NOTE — Assessment & Plan Note (Signed)
Sugars as outlined.  Follow met b and a1c.  Low carb diet and exercise.  Up to date with eye exam.  

## 2016-04-07 ENCOUNTER — Other Ambulatory Visit: Payer: Self-pay | Admitting: Internal Medicine

## 2016-04-08 DIAGNOSIS — M9903 Segmental and somatic dysfunction of lumbar region: Secondary | ICD-10-CM | POA: Diagnosis not present

## 2016-04-08 DIAGNOSIS — M5431 Sciatica, right side: Secondary | ICD-10-CM | POA: Diagnosis not present

## 2016-04-08 DIAGNOSIS — M9905 Segmental and somatic dysfunction of pelvic region: Secondary | ICD-10-CM | POA: Diagnosis not present

## 2016-04-08 DIAGNOSIS — M955 Acquired deformity of pelvis: Secondary | ICD-10-CM | POA: Diagnosis not present

## 2016-04-10 DIAGNOSIS — Z86010 Personal history of colonic polyps: Secondary | ICD-10-CM | POA: Diagnosis not present

## 2016-04-13 ENCOUNTER — Ambulatory Visit (INDEPENDENT_AMBULATORY_CARE_PROVIDER_SITE_OTHER): Payer: Medicare Other | Admitting: Podiatry

## 2016-04-13 ENCOUNTER — Encounter: Payer: Self-pay | Admitting: Podiatry

## 2016-04-13 VITALS — Resp 16 | Ht 64.0 in | Wt 197.0 lb

## 2016-04-13 DIAGNOSIS — M7751 Other enthesopathy of right foot: Secondary | ICD-10-CM

## 2016-04-13 DIAGNOSIS — M779 Enthesopathy, unspecified: Principal | ICD-10-CM

## 2016-04-13 DIAGNOSIS — M778 Other enthesopathies, not elsewhere classified: Secondary | ICD-10-CM

## 2016-04-13 NOTE — Progress Notes (Signed)
She presents today after having not seen her for couple of years with a chief enough pain to the dorsal aspect of her right foot. She states that it has done very well until the last few months where it started bothering again. She refers to the dorsal aspect of her right foot with osteoarthritis.  Objective: Vital signs are stable she is alert and oriented 3 pulses are palpable strong and equal bilateral. Muscle strength is symmetrical and equal bilateral. Neurologic sensory was intact. She does have tenderness on palpation dorsal aspect of the first metatarsal medial cuneiform joint and second metatarsal intermediate cuneiform joint right foot.  Assessment: Osteoarthritis right foot. Capsulitis dorsal aspect right foot.  Plan: I injected the area today with Kenalog and local anesthetic. Follow-up with her as needed.

## 2016-04-14 ENCOUNTER — Other Ambulatory Visit: Payer: Self-pay | Admitting: Internal Medicine

## 2016-04-14 ENCOUNTER — Ambulatory Visit
Admission: RE | Admit: 2016-04-14 | Discharge: 2016-04-14 | Disposition: A | Discharge status: Home / Self Care | Payer: Medicare Other | Source: Ambulatory Visit | Attending: Internal Medicine | Admitting: Internal Medicine

## 2016-04-14 DIAGNOSIS — Z1231 Encounter for screening mammogram for malignant neoplasm of breast: Secondary | ICD-10-CM | POA: Insufficient documentation

## 2016-04-14 DIAGNOSIS — Z1239 Encounter for other screening for malignant neoplasm of breast: Secondary | ICD-10-CM

## 2016-04-15 DIAGNOSIS — E119 Type 2 diabetes mellitus without complications: Secondary | ICD-10-CM | POA: Diagnosis not present

## 2016-04-30 ENCOUNTER — Telehealth: Payer: Self-pay | Admitting: Internal Medicine

## 2016-04-30 MED ORDER — SCOPOLAMINE 1 MG/3DAYS TD PT72: 1.0000 | MEDICATED_PATCH | TRANSDERMAL | 0 refills | Status: DC

## 2016-04-30 NOTE — Telephone Encounter (Signed)
Please advise 

## 2016-04-30 NOTE — Telephone Encounter (Signed)
Pt lvm asking for Dr. Nicki Reaper to send her a rx for motion sickness to [facility] on [address]. States she is leaving for a cruise Saturday and she does not want to get sick.

## 2016-04-30 NOTE — Telephone Encounter (Signed)
Left message to notify

## 2016-04-30 NOTE — Telephone Encounter (Signed)
I have sent in the prescription for the scopolamine patches.  She will place one behind her ear and change q 3 days.  rx sent in.

## 2016-05-01 ENCOUNTER — Other Ambulatory Visit: Payer: Self-pay

## 2016-05-01 MED ORDER — SCOPOLAMINE 1 MG/3DAYS TD PT72: 1.0000 | MEDICATED_PATCH | TRANSDERMAL | 0 refills | Status: DC

## 2016-05-11 DIAGNOSIS — M9905 Segmental and somatic dysfunction of pelvic region: Secondary | ICD-10-CM | POA: Diagnosis not present

## 2016-05-11 DIAGNOSIS — M955 Acquired deformity of pelvis: Secondary | ICD-10-CM | POA: Diagnosis not present

## 2016-05-11 DIAGNOSIS — M5431 Sciatica, right side: Secondary | ICD-10-CM | POA: Diagnosis not present

## 2016-05-11 DIAGNOSIS — M9903 Segmental and somatic dysfunction of lumbar region: Secondary | ICD-10-CM | POA: Diagnosis not present

## 2016-05-14 DIAGNOSIS — E119 Type 2 diabetes mellitus without complications: Secondary | ICD-10-CM | POA: Diagnosis not present

## 2016-05-14 DIAGNOSIS — E782 Mixed hyperlipidemia: Secondary | ICD-10-CM | POA: Diagnosis not present

## 2016-05-14 DIAGNOSIS — R072 Precordial pain: Secondary | ICD-10-CM | POA: Diagnosis not present

## 2016-05-14 DIAGNOSIS — I10 Essential (primary) hypertension: Secondary | ICD-10-CM | POA: Diagnosis not present

## 2016-06-08 DIAGNOSIS — M955 Acquired deformity of pelvis: Secondary | ICD-10-CM | POA: Diagnosis not present

## 2016-06-08 DIAGNOSIS — M9905 Segmental and somatic dysfunction of pelvic region: Secondary | ICD-10-CM | POA: Diagnosis not present

## 2016-06-08 DIAGNOSIS — M5431 Sciatica, right side: Secondary | ICD-10-CM | POA: Diagnosis not present

## 2016-06-08 DIAGNOSIS — M9903 Segmental and somatic dysfunction of lumbar region: Secondary | ICD-10-CM | POA: Diagnosis not present

## 2016-06-16 ENCOUNTER — Other Ambulatory Visit: Payer: Self-pay | Admitting: Internal Medicine

## 2016-06-17 ENCOUNTER — Other Ambulatory Visit: Payer: Self-pay | Admitting: Internal Medicine

## 2016-06-25 ENCOUNTER — Telehealth: Payer: Self-pay | Admitting: Internal Medicine

## 2016-06-25 MED ORDER — GLUCOSE BLOOD VI STRP: 1.0000 | ORAL_STRIP | Freq: Every day | 5 refills | Status: DC

## 2016-06-25 NOTE — Telephone Encounter (Signed)
Sent to pharmacy with dx code

## 2016-06-25 NOTE — Telephone Encounter (Signed)
Pt called and needs a refill on her blood strips, and we need to give them the correct code in order for Medicare to pay for them. Please advise, thank you!  [place], [place]  Call pt @ [phone]

## 2016-06-26 ENCOUNTER — Encounter: Payer: Self-pay | Admitting: *Deleted

## 2016-06-29 ENCOUNTER — Ambulatory Visit
Admission: RE | Admit: 2016-06-29 | Discharge: 2016-06-29 | Disposition: A | Discharge status: Home / Self Care | Payer: Medicare Other | Source: Ambulatory Visit | Attending: Unknown Physician Specialty | Admitting: Unknown Physician Specialty

## 2016-06-29 ENCOUNTER — Ambulatory Visit: Payer: Medicare Other | Admitting: Anesthesiology

## 2016-06-29 ENCOUNTER — Encounter: Payer: Self-pay | Admitting: *Deleted

## 2016-06-29 ENCOUNTER — Encounter
Admission: RE | Disposition: A | Discharge status: Home / Self Care | Payer: Self-pay | Source: Ambulatory Visit | Attending: Unknown Physician Specialty

## 2016-06-29 DIAGNOSIS — Z7982 Long term (current) use of aspirin: Secondary | ICD-10-CM | POA: Insufficient documentation

## 2016-06-29 DIAGNOSIS — K621 Rectal polyp: Secondary | ICD-10-CM | POA: Insufficient documentation

## 2016-06-29 DIAGNOSIS — D120 Benign neoplasm of cecum: Secondary | ICD-10-CM | POA: Insufficient documentation

## 2016-06-29 DIAGNOSIS — K219 Gastro-esophageal reflux disease without esophagitis: Secondary | ICD-10-CM | POA: Insufficient documentation

## 2016-06-29 DIAGNOSIS — Z7984 Long term (current) use of oral hypoglycemic drugs: Secondary | ICD-10-CM | POA: Insufficient documentation

## 2016-06-29 DIAGNOSIS — Z1211 Encounter for screening for malignant neoplasm of colon: Secondary | ICD-10-CM | POA: Insufficient documentation

## 2016-06-29 DIAGNOSIS — K5730 Diverticulosis of large intestine without perforation or abscess without bleeding: Secondary | ICD-10-CM | POA: Diagnosis not present

## 2016-06-29 DIAGNOSIS — E538 Deficiency of other specified B group vitamins: Secondary | ICD-10-CM | POA: Diagnosis not present

## 2016-06-29 DIAGNOSIS — Z86010 Personal history of colonic polyps: Secondary | ICD-10-CM | POA: Diagnosis not present

## 2016-06-29 DIAGNOSIS — Z87891 Personal history of nicotine dependence: Secondary | ICD-10-CM | POA: Insufficient documentation

## 2016-06-29 DIAGNOSIS — Z79899 Other long term (current) drug therapy: Secondary | ICD-10-CM | POA: Diagnosis not present

## 2016-06-29 DIAGNOSIS — E7800 Pure hypercholesterolemia, unspecified: Secondary | ICD-10-CM | POA: Diagnosis not present

## 2016-06-29 DIAGNOSIS — D122 Benign neoplasm of ascending colon: Secondary | ICD-10-CM | POA: Diagnosis not present

## 2016-06-29 DIAGNOSIS — K5790 Diverticulosis of intestine, part unspecified, without perforation or abscess without bleeding: Secondary | ICD-10-CM | POA: Diagnosis not present

## 2016-06-29 DIAGNOSIS — I10 Essential (primary) hypertension: Secondary | ICD-10-CM | POA: Diagnosis not present

## 2016-06-29 DIAGNOSIS — E119 Type 2 diabetes mellitus without complications: Secondary | ICD-10-CM | POA: Insufficient documentation

## 2016-06-29 DIAGNOSIS — K635 Polyp of colon: Secondary | ICD-10-CM | POA: Diagnosis not present

## 2016-06-29 HISTORY — PX: COLONOSCOPY WITH PROPOFOL: SHX5780

## 2016-06-29 LAB — GLUCOSE, CAPILLARY: GLUCOSE-CAPILLARY: 113 mg/dL — AB (ref 65–99)

## 2016-06-29 SURGERY — COLONOSCOPY WITH PROPOFOL
Anesthesia: General

## 2016-06-29 MED ORDER — LIDOCAINE HCL (CARDIAC) 20 MG/ML IV SOLN
INTRAVENOUS | Status: DC | PRN
  Administered 2016-06-29: 80 mg via INTRAVENOUS

## 2016-06-29 MED ORDER — PROPOFOL 10 MG/ML IV BOLUS
INTRAVENOUS | Status: DC | PRN
  Administered 2016-06-29: 70 mg via INTRAVENOUS
  Administered 2016-06-29: 10 mg via INTRAVENOUS

## 2016-06-29 MED ORDER — SODIUM CHLORIDE 0.9 % IV SOLN: INTRAVENOUS | Status: DC

## 2016-06-29 MED ORDER — PROPOFOL 500 MG/50ML IV EMUL
INTRAVENOUS | Status: DC | PRN
  Administered 2016-06-29: 150 ug/kg/min via INTRAVENOUS

## 2016-06-29 MED ORDER — SODIUM CHLORIDE 0.9 % IV SOLN
INTRAVENOUS | Status: DC
  Administered 2016-06-29: 09:00:00 via INTRAVENOUS

## 2016-06-29 MED ORDER — FENTANYL CITRATE (PF) 100 MCG/2ML IJ SOLN
INTRAMUSCULAR | Status: DC | PRN
  Administered 2016-06-29: 50 ug via INTRAVENOUS

## 2016-06-29 NOTE — Anesthesia Procedure Notes (Addendum)
Performed by: Lance Muss Pre-anesthesia Checklist: Patient identified, Emergency Drugs available, Suction available, Patient being monitored and Timeout performed Patient Re-evaluated:Patient Re-evaluated prior to inductionOxygen Delivery Method: Nasal cannula Intubation Type: IV induction Ventilation: Nasal airway inserted- appropriate to patient size

## 2016-06-29 NOTE — Anesthesia Postprocedure Evaluation (Signed)
Anesthesia Post Note  Patient: Misty Jones  Procedure(s) Performed: Procedure(s) (LRB): COLONOSCOPY WITH PROPOFOL (N/A)  Patient location during evaluation: PACU Anesthesia Type: General Level of consciousness: awake and alert Pain management: pain level controlled Vital Signs Assessment: post-procedure vital signs reviewed and stable Respiratory status: spontaneous breathing, nonlabored ventilation, respiratory function stable and patient connected to nasal cannula oxygen Cardiovascular status: blood pressure returned to baseline and stable Postop Assessment: no signs of nausea or vomiting Anesthetic complications: no    Last Vitals:  Vitals:   06/29/16 0936 06/29/16 0946  BP: 106/89 125/70  Pulse: 96   Resp:    Temp:      Last Pain:  Vitals:   06/29/16 0935  TempSrc: Oral                 Molli Barrows

## 2016-06-29 NOTE — Anesthesia Preprocedure Evaluation (Signed)
Anesthesia Evaluation  Patient identified by MRN, date of birth, ID band Patient awake    Reviewed: Allergy & Precautions, H&P , NPO status , Patient's Chart, lab work & pertinent test results, reviewed documented beta blocker date and time   Airway Mallampati: II   Neck ROM: full    Dental  (+) Poor Dentition   Pulmonary neg pulmonary ROS, former smoker,    Pulmonary exam normal        Cardiovascular hypertension, negative cardio ROS Normal cardiovascular exam Rhythm:regular Rate:Normal     Neuro/Psych negative neurological ROS  negative psych ROS   GI/Hepatic negative GI ROS, Neg liver ROS, GERD  Medicated,  Endo/Other  negative endocrine ROSdiabetes  Renal/GU negative Renal ROS  negative genitourinary   Musculoskeletal   Abdominal   Peds  Hematology negative hematology ROS (+)   Anesthesia Other Findings Past Medical History: No date: B12 deficiency No date: Chicken pox No date: Diabetes mellitus ([facility]) No date: GERD (gastroesophageal reflux disease) No date: History of diverticulitis of colon No date: Hypercholesterolemia No date: Hypertension Past Surgical History: 1984: ABDOMINAL HYSTERECTOMY 1986: BACK SURGERY     Comment: ruptured disc, Dr Hardin Negus [facility]) 1991: CHOLECYSTECTOMY 1992: CHOLECYSTECTOMY No date: TONSILLECTOMY BMI    Body Mass Index:  33.81 kg/m     Reproductive/Obstetrics negative OB ROS                             Anesthesia Physical Anesthesia Plan  ASA: III  Anesthesia Plan: General   Post-op Pain Management:    Induction:   Airway Management Planned:   Additional Equipment:   Intra-op Plan:   Post-operative Plan:   Informed Consent: I have reviewed the patients History and Physical, chart, labs and discussed the procedure including the risks, benefits and alternatives for the proposed anesthesia with the patient or authorized  representative who has indicated his/her understanding and acceptance.   Dental Advisory Given  Plan Discussed with: CRNA  Anesthesia Plan Comments:         Anesthesia Quick Evaluation

## 2016-06-29 NOTE — Transfer of Care (Signed)
Immediate Anesthesia Transfer of Care Note  Patient: Misty Jones  Procedure(s) Performed: Procedure(s): COLONOSCOPY WITH PROPOFOL (N/A)  Patient Location: PACU  Anesthesia Type:General  Level of Consciousness: awake, alert , oriented and responds to stimulation  Airway & Oxygen Therapy: Patient Spontanous Breathing and Patient connected to nasal cannula oxygen  Post-op Assessment: Report given to RN and Post -op Vital signs reviewed and stable  Post vital signs: Reviewed and stable  Last Vitals:  Vitals:   06/29/16 0816 06/29/16 0936  BP: 138/79 106/89  Pulse: 87 96  Resp: 18   Temp: 36.7 C     Last Pain:  Vitals:   06/29/16 0816  TempSrc: Tympanic         Complications: No apparent anesthesia complications

## 2016-06-29 NOTE — Op Note (Signed)
[facility] Gastroenterology Patient Name: Kinsie Fought Procedure Date: 06/29/2016 9:01 AM MRN: [id number] Account #: [id number] Date of Birth: [birth year] Admit Type: Outpatient Age: 75 Room: [facility] ENDO ROOM 4 Gender: Female Note Status: Finalized Procedure:            Colonoscopy Indications:          High risk colon cancer surveillance: Personal history                        of colonic polyps Providers:            Manya Silvas, MD Referring MD:         [name], MD (Referring MD) Medicines:            Propofol per Anesthesia Complications:        No immediate complications. Procedure:            Pre-Anesthesia Assessment:                       - After reviewing the risks and benefits, the patient                        was deemed in satisfactory condition to undergo the                        procedure.                       After obtaining informed consent, the colonoscope was                        passed under direct vision. Throughout the procedure,                        the patient's blood pressure, pulse, and oxygen                        saturations were monitored continuously. The                        Colonoscope was introduced through the anus and                        advanced to the the cecum, identified by appendiceal                        orifice and ileocecal valve. The colonoscopy was                        performed without difficulty. The patient tolerated the                        procedure well. The quality of the bowel preparation                        was good. Findings:      A diminutive polyp was found in the cecum. The polyp was sessile. The       polyp was removed with a jumbo cold forceps. Resection and retrieval       were complete.      A small polyp was found in  the ascending colon. The polyp was sessile.       The polyp was removed with a hot snare. Resection and retrieval were       complete. To prevent  bleeding after the polypectomy, one hemostatic clip       was successfully placed. There was no bleeding during, or at the end, of       the procedure.      Two sessile polyps were found in the rectum. The polyps were diminutive       in size. These polyps were removed with a jumbo cold forceps. Resection       and retrieval were complete.      Multiple small-mouthed diverticula were found in the sigmoid colon,       descending colon and transverse colon.      The exam was otherwise without abnormality. Impression:           - One diminutive polyp in the cecum, removed with a                        jumbo cold forceps. Resected and retrieved.                       - One small polyp in the ascending colon, removed with                        a hot snare. Resected and retrieved. Clip was placed.                       - Two diminutive polyps in the rectum, removed with a                        jumbo cold forceps. Resected and retrieved. Recommendation:       - Await pathology results.                       - Await pathology results. Manya Silvas, MD 06/29/2016 9:33:40 AM This report has been signed electronically. Number of Addenda: 0 Note Initiated On: 06/29/2016 9:01 AM Scope Withdrawal Time: 0 hours 11 minutes 32 seconds  Total Procedure Duration: 0 hours 26 minutes 0 seconds       [facility]

## 2016-06-29 NOTE — H&P (Signed)
Primary Care Physician:  [name], MD Primary Gastroenterologist:  Dr. Vira Agar  Pre-Procedure History & Physical: HPI:  Misty Jones is a 75 y.o. female is here for an colonoscopy.   Past Medical History:  Diagnosis Date  . B12 deficiency   . Chicken pox   . Diabetes mellitus ([place])   . GERD (gastroesophageal reflux disease)   . History of diverticulitis of colon   . Hypercholesterolemia   . Hypertension     Past Surgical History:  Procedure Laterality Date  . ABDOMINAL HYSTERECTOMY  1984  . BACK SURGERY  1986   ruptured disc, Dr Hardin Negus [facility])  . CHOLECYSTECTOMY  1991  . CHOLECYSTECTOMY  1992  . TONSILLECTOMY      Prior to Admission medications   Medication Sig Start Date End Date Taking? Authorizing Provider  aspirin 81 MG tablet Take 81 mg by mouth daily.   Yes Historical Provider, MD  benazepril (LOTENSIN) 40 MG tablet TAKE ONE TABLET BY MOUTH ONCE DAILY 04/07/16  Yes [name], MD  beta carotene w/minerals (OCUVITE) tablet Take 1 tablet by mouth daily.   Yes Historical Provider, MD  Calcium Carbonate-Vitamin D (CALCIUM 600+D) 600-400 MG-UNIT per tablet Take 1 tablet by mouth 2 (two) times daily.   Yes Historical Provider, MD  Cholecalciferol (VITAMIN D) 2000 UNITS tablet Take 1,000 Units by mouth daily.    Yes Historical Provider, MD  cyanocobalamin (,VITAMIN B-12,) 1000 MCG/ML injection INJECT 1 ML (1,000 MCG TOTAL) INTO THE MUSCLE EVERY 30 (THIRTY) DAYS. 08/01/15  Yes [name], MD  fish oil-omega-3 fatty acids 1000 MG capsule Take 1 g by mouth 2 (two) times daily.   Yes Historical Provider, MD  hydrochlorothiazide (HYDRODIURIL) 25 MG tablet TAKE ONE-HALF TABLET BY MOUTH ONCE DAILY 12/26/15  Yes [name], MD  metFORMIN (GLUCOPHAGE) 500 MG tablet TAKE ONE TABLET BY MOUTH ONCE DAILY 02/18/16  Yes [name], MD  niacin 100 MG tablet Take 100 mg by mouth at bedtime.   Yes Historical Provider, MD  omeprazole (PRILOSEC OTC) 20 MG tablet Take  20 mg by mouth daily.   Yes Historical Provider, MD  BAYER CONTOUR TEST test strip USE ONE STRIP TO CHECK GLUCOSE ONCE DAILY 06/17/16   [name], MD  glucose blood (BAYER CONTOUR TEST) test strip 1 each by Other route daily. Use as instructed 06/25/16   [name], MD  Lancets MISC Check sugars once a day Dx: E11.9 (Contour Next) 07/04/15   [name], MD  scopolamine (TRANSDERM-SCOP, 1.5 MG,) 1 MG/3DAYS Place 1 patch (1.5 mg total) onto the skin every 3 (three) days. Patient not taking: Reported on 06/29/2016 05/01/16   [name], MD    Allergies as of 05/21/2016 - Review Complete 04/13/2016  Allergen Reaction Noted  . Augmentin [amoxicillin-pot clavulanate] Other (See Comments) 06/07/2012    Family History  Problem Relation Age of Onset  . Hypertension Mother   . Cancer Mother     Mouth cancer  . Heart disease Father     myocardial infarction  . Raynaud syndrome Sister   . Thyroid disease Sister   . Asthma Sister   . Breast cancer Neg Hx   . Colon cancer Neg Hx     Social History   Social History  . Marital status: Divorced    Spouse name: N/A  . Number of children: 2  . Years of education: N/A   Occupational History  . retired    Social History Main Topics  .  Smoking status: Former Smoker    Packs/day: 0.50    Years: 20.00    Quit date: 07/26/1984  . Smokeless tobacco: Never Used     Comment: quit smoking years ago  . Alcohol use No  . Drug use: No  . Sexual activity: No   Other Topics Concern  . Not on file   Social History Narrative  . No narrative on file    Review of Systems: See HPI, otherwise negative ROS  Physical Exam: BP 138/79   Pulse 87   Temp 98 F (36.7 C) (Tympanic)   Resp 18   Ht 5\' 4"  (1.626 m)   Wt 89.4 kg (197 lb)   SpO2 100%   BMI 33.81 kg/m  General:   Alert,  pleasant and cooperative in NAD Head:  Normocephalic and atraumatic. Neck:  Supple; no masses or thyromegaly. Lungs:  Clear throughout to  auscultation.    Heart:  Regular rate and rhythm. Abdomen:  Soft, nontender and nondistended. Normal bowel sounds, without guarding, and without rebound.   Neurologic:  Alert and  oriented x4;  grossly normal neurologically.  Impression/Plan: Misty Jones is here for an colonoscopy to be performed for [facility] colon polyp  Risks, benefits, limitations, and alternatives regarding  colonoscopy have been reviewed with the patient.  Questions have been answered.  All parties agreeable.   Gaylyn Cheers, MD  06/29/2016, 8:54 AM

## 2016-06-30 ENCOUNTER — Encounter: Payer: Self-pay | Admitting: Unknown Physician Specialty

## 2016-06-30 LAB — SURGICAL PATHOLOGY

## 2016-07-02 ENCOUNTER — Other Ambulatory Visit: Payer: Self-pay | Admitting: Internal Medicine

## 2016-07-14 DIAGNOSIS — M9903 Segmental and somatic dysfunction of lumbar region: Secondary | ICD-10-CM | POA: Diagnosis not present

## 2016-07-14 DIAGNOSIS — M5431 Sciatica, right side: Secondary | ICD-10-CM | POA: Diagnosis not present

## 2016-07-14 DIAGNOSIS — M955 Acquired deformity of pelvis: Secondary | ICD-10-CM | POA: Diagnosis not present

## 2016-07-14 DIAGNOSIS — M9905 Segmental and somatic dysfunction of pelvic region: Secondary | ICD-10-CM | POA: Diagnosis not present

## 2016-07-30 ENCOUNTER — Ambulatory Visit: Payer: BLUE CROSS/BLUE SHIELD | Admitting: Internal Medicine

## 2016-08-06 ENCOUNTER — Encounter: Payer: Self-pay | Admitting: Internal Medicine

## 2016-08-06 ENCOUNTER — Ambulatory Visit (INDEPENDENT_AMBULATORY_CARE_PROVIDER_SITE_OTHER): Payer: Medicare Other | Admitting: Internal Medicine

## 2016-08-06 VITALS — BP 126/78 | HR 78 | Temp 98.3°F | Ht 64.0 in | Wt 203.6 lb

## 2016-08-06 DIAGNOSIS — E119 Type 2 diabetes mellitus without complications: Secondary | ICD-10-CM | POA: Diagnosis not present

## 2016-08-06 DIAGNOSIS — K219 Gastro-esophageal reflux disease without esophagitis: Secondary | ICD-10-CM

## 2016-08-06 DIAGNOSIS — Z860100 Personal history of colon polyps, unspecified: Secondary | ICD-10-CM

## 2016-08-06 DIAGNOSIS — E7800 Pure hypercholesterolemia, unspecified: Secondary | ICD-10-CM | POA: Diagnosis not present

## 2016-08-06 DIAGNOSIS — E669 Obesity, unspecified: Secondary | ICD-10-CM | POA: Diagnosis not present

## 2016-08-06 DIAGNOSIS — I10 Essential (primary) hypertension: Secondary | ICD-10-CM | POA: Diagnosis not present

## 2016-08-06 LAB — HEPATIC FUNCTION PANEL
ALBUMIN: 4.1 g/dL (ref 3.5–5.2)
ALT: 16 U/L (ref 0–35)
AST: 15 U/L (ref 0–37)
Alkaline Phosphatase: 51 U/L (ref 39–117)
Bilirubin, Direct: 0.1 mg/dL (ref 0.0–0.3)
Total Bilirubin: 0.5 mg/dL (ref 0.2–1.2)
Total Protein: 6.1 g/dL (ref 6.0–8.3)

## 2016-08-06 LAB — LIPID PANEL
CHOLESTEROL: 228 mg/dL — AB (ref 0–200)
HDL: 50 mg/dL (ref >39.00)
LDL CALC: 151 mg/dL — AB (ref 0–99)
NONHDL: 177.96
Total CHOL/HDL Ratio: 5
Triglycerides: 136 mg/dL (ref 0.0–149.0)
VLDL: 27.2 mg/dL (ref 0.0–40.0)

## 2016-08-06 LAB — BASIC METABOLIC PANEL
BUN: 14 mg/dL (ref 6–23)
CALCIUM: 9.6 mg/dL (ref 8.4–10.5)
CO2: 33 mEq/L — ABNORMAL HIGH (ref 19–32)
CREATININE: 0.87 mg/dL (ref 0.40–1.20)
Chloride: 102 mEq/L (ref 96–112)
GFR: 67.24 mL/min (ref >60.00)
Glucose, Bld: 121 mg/dL — ABNORMAL HIGH (ref 70–99)
Potassium: 4.7 mEq/L (ref 3.5–5.1)
Sodium: 141 mEq/L (ref 135–145)

## 2016-08-06 LAB — HEMOGLOBIN A1C: HEMOGLOBIN A1C: 6.5 % (ref 4.6–6.5)

## 2016-08-06 LAB — MICROALBUMIN / CREATININE URINE RATIO
CREATININE, U: 79.1 mg/dL
Microalb Creat Ratio: 0.9 mg/g (ref 0.0–30.0)

## 2016-08-06 LAB — TSH: TSH: 2.81 u[IU]/mL (ref 0.35–4.50)

## 2016-08-06 NOTE — Progress Notes (Signed)
Pre visit review using our clinic review tool, if applicable. No additional management support is needed unless otherwise documented below in the visit note. 

## 2016-08-06 NOTE — Progress Notes (Signed)
Patient ID: Misty Jones, female   DOB: 18-Dec-1940, 76 y.o.   MRN: [id number]   Subjective:    Patient ID: Misty Jones, female    DOB: 01-31-1941, 76 y.o.   MRN: [id number]  HPI  Patient here for a scheduled follow up.  She has not been watching her diet as well.  Gained some weight overall.  Back at [employer] now.  Watching her diet.  Discussed exercise.  Some low back pain.  Has been seeing chiropractor.  Having some right lower back pain and pain in right hip.  Discussed further evaluation and w/up.  She declines.  Plans to do more exercises she has been shown.  No abdominal pain.  Had recent colonoscopy.  Polyps removed.  Recommended f/u in five years.  Blood sugars averaging 103-119.     Past Medical History:  Diagnosis Date  . B12 deficiency   . Chicken pox   . Diabetes mellitus ([place])   . GERD (gastroesophageal reflux disease)   . History of diverticulitis of colon   . Hypercholesterolemia   . Hypertension    Past Surgical History:  Procedure Laterality Date  . ABDOMINAL HYSTERECTOMY  1984  . BACK SURGERY  1986   ruptured disc, Dr Hardin Negus [facility])  . CHOLECYSTECTOMY  1991  . CHOLECYSTECTOMY  1992  . COLONOSCOPY WITH PROPOFOL N/A 06/29/2016   Procedure: COLONOSCOPY WITH PROPOFOL;  Surgeon: Manya Silvas, MD;  Location: [facility] ENDOSCOPY;  Service: Endoscopy;  Laterality: N/A;  . TONSILLECTOMY     Family History  Problem Relation Age of Onset  . Hypertension Mother   . Cancer Mother     Mouth cancer  . Heart disease Father     myocardial infarction  . Raynaud syndrome Sister   . Thyroid disease Sister   . Asthma Sister   . Breast cancer Neg Hx   . Colon cancer Neg Hx    Social History   Social History  . Marital status: Divorced    Spouse name: N/A  . Number of children: 2  . Years of education: N/A   Occupational History  . retired    Social History Main Topics  . Smoking status: Former Smoker    Packs/day: 0.50    Years: 20.00    Quit date:  07/26/1984  . Smokeless tobacco: Never Used     Comment: quit smoking years ago  . Alcohol use No  . Drug use: No  . Sexual activity: No   Other Topics Concern  . None   Social History Narrative  . None    Outpatient Encounter Prescriptions as of 08/06/2016  Medication Sig  . aspirin 81 MG tablet Take 81 mg by mouth daily.  Marland Kitchen BAYER CONTOUR TEST test strip USE ONE STRIP TO CHECK GLUCOSE ONCE DAILY  . benazepril (LOTENSIN) 40 MG tablet TAKE ONE TABLET BY MOUTH ONCE DAILY  . beta carotene w/minerals (OCUVITE) tablet Take 1 tablet by mouth daily.  . Calcium Carbonate-Vitamin D (CALCIUM 600+D) 600-400 MG-UNIT per tablet Take 1 tablet by mouth 2 (two) times daily.  . Cholecalciferol (VITAMIN D) 2000 UNITS tablet Take 1,000 Units by mouth daily.   . cyanocobalamin (,VITAMIN B-12,) 1000 MCG/ML injection INJECT 1 ML (1,000 MCG TOTAL) INTO THE MUSCLE EVERY 30 (THIRTY) DAYS.  . fish oil-omega-3 fatty acids 1000 MG capsule Take 1 g by mouth 2 (two) times daily.  Marland Kitchen glucose blood (BAYER CONTOUR TEST) test strip 1 each by Other route daily. Use as instructed  .  hydrochlorothiazide (HYDRODIURIL) 25 MG tablet TAKE ONE-HALF TABLET BY MOUTH ONCE DAILY  . Lancets MISC Check sugars once a day Dx: E11.9 (Contour Next)  . metFORMIN (GLUCOPHAGE) 500 MG tablet TAKE ONE TABLET BY MOUTH ONCE DAILY  . niacin 100 MG tablet Take 100 mg by mouth at bedtime.  Marland Kitchen omeprazole (PRILOSEC OTC) 20 MG tablet Take 20 mg by mouth daily.  Marland Kitchen scopolamine (TRANSDERM-SCOP, 1.5 MG,) 1 MG/3DAYS Place 1 patch (1.5 mg total) onto the skin every 3 (three) days.   No facility-administered encounter medications on file as of 08/06/2016.     Review of Systems  Constitutional: Negative for appetite change and unexpected weight change.  HENT: Negative for congestion and sinus pain.   Respiratory: Negative for cough, chest tightness and shortness of breath.   Cardiovascular: Negative for chest pain, palpitations and leg swelling.    Gastrointestinal: Negative for abdominal pain, diarrhea, nausea and vomiting.  Genitourinary: Negative for difficulty urinating and dysuria.  Musculoskeletal: Positive for back pain. Negative for joint swelling.       Right low back and right hip pain as outlined.    Skin: Negative for color change and rash.  Neurological: Negative for dizziness, light-headedness and headaches.  Psychiatric/Behavioral: Negative for agitation and dysphoric mood.       Objective:    Physical Exam  Constitutional: She appears well-developed and well-nourished. No distress.  HENT:  Nose: Nose normal.  Mouth/Throat: Oropharynx is clear and moist.  Neck: Neck supple. No thyromegaly present.  Cardiovascular: Normal rate and regular rhythm.   Pulmonary/Chest: Breath sounds normal. No respiratory distress. She has no wheezes.  Abdominal: Soft. Bowel sounds are normal. There is no tenderness.  Musculoskeletal: She exhibits no edema or tenderness.  Lymphadenopathy:    She has no cervical adenopathy.  Skin: No rash noted. No erythema.  Psychiatric: She has a normal mood and affect. Her behavior is normal.    BP 126/78   Pulse 78   Temp 98.3 F (36.8 C) (Oral)   Ht 5' 4"  (1.626 m)   Wt 203 lb 9.6 oz (92.4 kg)   SpO2 94%   BMI 34.95 kg/m  Wt Readings from Last 3 Encounters:  08/06/16 203 lb 9.6 oz (92.4 kg)  06/29/16 197 lb (89.4 kg)  04/13/16 197 lb (89.4 kg)     Lab Results  Component Value Date   WBC 6.4 03/25/2016   HGB 12.9 03/25/2016   HCT 38.5 03/25/2016   PLT 290.0 03/25/2016   GLUCOSE 121 (H) 08/06/2016   CHOL 228 (H) 08/06/2016   TRIG 136.0 08/06/2016   HDL 50.00 08/06/2016   LDLDIRECT 171.2 05/26/2013   LDLCALC 151 (H) 08/06/2016   ALT 16 08/06/2016   AST 15 08/06/2016   NA 141 08/06/2016   K 4.7 08/06/2016   CL 102 08/06/2016   CREATININE 0.87 08/06/2016   BUN 14 08/06/2016   CO2 33 (H) 08/06/2016   TSH 2.81 08/06/2016   INR 0.9 04/03/2012   HGBA1C 6.5 08/06/2016    MICROALBUR <0.7 08/06/2016       Assessment & Plan:   Problem List Items Addressed This Visit    Diabetes mellitus ([place])    Low carb diet and exercise.  Follow met b and a1c.        Relevant Orders   Hemoglobin A1c (Completed)   Basic metabolic panel (Completed)   Microalbumin / creatinine urine ratio (Completed)   Hemoglobin S1X   Basic metabolic panel   GERD (gastroesophageal reflux  disease)    On omeprazole.  Controlled.        History of colonic polyps    Colonoscopy 06/29/16 - one polyp in the cecum, one polyp in the ascending colon and two rectal polyp (tubular adenomas).  Recommended f/u colonoscopy in five years.        Hypercholesterolemia    Did not tolerate statin medications.  Low cholesterol diet and exercise.  Follow lipid panel and liver function tests.        Relevant Orders   Lipid panel (Completed)   Hepatic function panel (Completed)   Hepatic function panel   Lipid panel   Hypertension - Primary    Blood pressure under good control.  Continue same medication regimen.  Follow pressures.  Follow metabolic panel.        Relevant Orders   TSH (Completed)   Obesity (BMI 30-39.9)    Diet and exercise.  Follow.           [name], MD

## 2016-08-09 ENCOUNTER — Encounter: Payer: Self-pay | Admitting: Internal Medicine

## 2016-08-09 DIAGNOSIS — Z86010 Personal history of colonic polyps: Secondary | ICD-10-CM | POA: Insufficient documentation

## 2016-08-09 NOTE — Assessment & Plan Note (Signed)
Diet and exercise.  Follow.  

## 2016-08-09 NOTE — Assessment & Plan Note (Signed)
Did not tolerate statin medications.  Low cholesterol diet and exercise.  Follow lipid panel and liver function tests.

## 2016-08-09 NOTE — Assessment & Plan Note (Signed)
Colonoscopy 06/29/16 - one polyp in the cecum, one polyp in the ascending colon and two rectal polyp (tubular adenomas).  Recommended f/u colonoscopy in five years.

## 2016-08-09 NOTE — Assessment & Plan Note (Signed)
On omeprazole.  Controlled.   

## 2016-08-09 NOTE — Assessment & Plan Note (Signed)
Low carb diet and exercise.  Follow met b and a1c.   

## 2016-08-09 NOTE — Assessment & Plan Note (Signed)
Blood pressure under good control.  Continue same medication regimen.  Follow pressures.  Follow metabolic panel.   

## 2016-08-20 ENCOUNTER — Other Ambulatory Visit: Payer: Self-pay | Admitting: Internal Medicine

## 2016-09-07 ENCOUNTER — Other Ambulatory Visit: Payer: Self-pay | Admitting: Internal Medicine

## 2016-09-09 ENCOUNTER — Other Ambulatory Visit: Payer: Self-pay | Admitting: Internal Medicine

## 2016-09-10 ENCOUNTER — Telehealth: Payer: Self-pay | Admitting: *Deleted

## 2016-09-10 NOTE — Telephone Encounter (Signed)
Pt requested a medication refill cyanocobalamin  Pharmacy [facility] [address]

## 2016-09-10 NOTE — Telephone Encounter (Signed)
Done yesterday.

## 2016-09-13 ENCOUNTER — Encounter: Payer: Self-pay | Admitting: Emergency Medicine

## 2016-09-13 ENCOUNTER — Emergency Department
Admission: EM | Admit: 2016-09-13 | Discharge: 2016-09-13 | Disposition: A | Discharge status: Home / Self Care | Payer: Medicare Other | Attending: Emergency Medicine | Admitting: Emergency Medicine

## 2016-09-13 ENCOUNTER — Emergency Department: Payer: Medicare Other

## 2016-09-13 DIAGNOSIS — Z7984 Long term (current) use of oral hypoglycemic drugs: Secondary | ICD-10-CM | POA: Diagnosis not present

## 2016-09-13 DIAGNOSIS — Z87891 Personal history of nicotine dependence: Secondary | ICD-10-CM | POA: Diagnosis not present

## 2016-09-13 DIAGNOSIS — Z7982 Long term (current) use of aspirin: Secondary | ICD-10-CM | POA: Insufficient documentation

## 2016-09-13 DIAGNOSIS — R079 Chest pain, unspecified: Secondary | ICD-10-CM | POA: Diagnosis present

## 2016-09-13 DIAGNOSIS — E119 Type 2 diabetes mellitus without complications: Secondary | ICD-10-CM | POA: Diagnosis not present

## 2016-09-13 DIAGNOSIS — R0789 Other chest pain: Secondary | ICD-10-CM | POA: Diagnosis not present

## 2016-09-13 DIAGNOSIS — Z79899 Other long term (current) drug therapy: Secondary | ICD-10-CM | POA: Diagnosis not present

## 2016-09-13 DIAGNOSIS — I10 Essential (primary) hypertension: Secondary | ICD-10-CM | POA: Insufficient documentation

## 2016-09-13 LAB — BASIC METABOLIC PANEL
Anion gap: 9 (ref 5–15)
BUN: 20 mg/dL (ref 6–20)
CHLORIDE: 100 mmol/L — AB (ref 101–111)
CO2: 30 mmol/L (ref 22–32)
CREATININE: 1.19 mg/dL — AB (ref 0.44–1.00)
Calcium: 9.3 mg/dL (ref 8.9–10.3)
GFR calc non Af Amer: 43 mL/min — ABNORMAL LOW (ref >60)
GFR, EST AFRICAN AMERICAN: 50 mL/min — AB (ref >60)
Glucose, Bld: 138 mg/dL — ABNORMAL HIGH (ref 65–99)
Potassium: 3.9 mmol/L (ref 3.5–5.1)
Sodium: 139 mmol/L (ref 135–145)

## 2016-09-13 LAB — CBC WITH DIFFERENTIAL/PLATELET
BASOS PCT: 1 %
Basophils Absolute: 0.1 10*3/uL (ref 0–0.1)
Eosinophils Absolute: 0.1 10*3/uL (ref 0–0.7)
Eosinophils Relative: 1 %
HEMATOCRIT: 42.1 % (ref 35.0–47.0)
HEMOGLOBIN: 13.9 g/dL (ref 12.0–16.0)
LYMPHS ABS: 2.2 10*3/uL (ref 1.0–3.6)
Lymphocytes Relative: 21 %
MCH: 30.3 pg (ref 26.0–34.0)
MCHC: 32.9 g/dL (ref 32.0–36.0)
MCV: 92 fL (ref 80.0–100.0)
MONOS PCT: 7 %
Monocytes Absolute: 0.7 10*3/uL (ref 0.2–0.9)
NEUTROS ABS: 7.4 10*3/uL — AB (ref 1.4–6.5)
NEUTROS PCT: 70 %
Platelets: 282 10*3/uL (ref 150–440)
RBC: 4.57 MIL/uL (ref 3.80–5.20)
RDW: 13.7 % (ref 11.5–14.5)
WBC: 10.5 10*3/uL (ref 3.6–11.0)

## 2016-09-13 LAB — TROPONIN I: Troponin I: <0.03 ng/mL (ref <0.03)

## 2016-09-13 MED ORDER — SODIUM CHLORIDE 0.9 % IV BOLUS (SEPSIS)
500.0000 mL | Freq: Once | INTRAVENOUS | Status: AC
  Administered 2016-09-13: 500 mL via INTRAVENOUS

## 2016-09-13 MED ORDER — ASPIRIN 81 MG PO CHEW
243.0000 mg | CHEWABLE_TABLET | Freq: Once | ORAL | Status: AC
  Administered 2016-09-13: 243 mg via ORAL
  Filled 2016-09-13: qty 3

## 2016-09-13 MED ORDER — FAMOTIDINE IN NACL 20-0.9 MG/50ML-% IV SOLN
20.0000 mg | Freq: Once | INTRAVENOUS | Status: AC
  Administered 2016-09-13: 20 mg via INTRAVENOUS
  Filled 2016-09-13: qty 50

## 2016-09-13 MED ORDER — ALUM & MAG HYDROXIDE-SIMETH 400-400-40 MG/5ML PO SUSP: 5.0000 mL | Freq: Four times a day (QID) | ORAL | 0 refills | Status: DC | PRN

## 2016-09-13 MED ORDER — GI COCKTAIL ~~LOC~~
30.0000 mL | Freq: Once | ORAL | Status: AC
  Administered 2016-09-13: 30 mL via ORAL
  Filled 2016-09-13: qty 30

## 2016-09-13 MED ORDER — OMEPRAZOLE MAGNESIUM 20 MG PO TBEC: 20.0000 mg | DELAYED_RELEASE_TABLET | Freq: Two times a day (BID) | ORAL | 1 refills | Status: DC

## 2016-09-13 MED ORDER — ONDANSETRON HCL 4 MG/2ML IJ SOLN
4.0000 mg | Freq: Once | INTRAMUSCULAR | Status: AC
  Administered 2016-09-13: 4 mg via INTRAVENOUS
  Filled 2016-09-13: qty 2

## 2016-09-13 NOTE — ED Notes (Signed)
Pt ambulatory to bedside commode no further needs at present

## 2016-09-13 NOTE — ED Notes (Signed)
Patient transported to X-ray 

## 2016-09-13 NOTE — ED Notes (Signed)
Pt from home with burning chest pain x 1 week. States she has reflux hx and sees cardiologist periodically for check ups. Pt states her pain is not worse today, but that it awakened her out of sleep this morning. Pt alert & oriented with NAD noted.

## 2016-09-13 NOTE — Discharge Instructions (Signed)

## 2016-09-13 NOTE — ED Provider Notes (Signed)
[facility] Emergency Department Provider Note  ____________________________________________  Time seen: Approximately 7:56 AM  I have reviewed the triage vital signs and the nursing notes.   HISTORY  Chief Complaint Chest Pain   HPI Misty Jones is a 76 y.o. female with a history of hypertension, hyperlipidemia, diabetes, GERD who presents for evaluation of chest pain.Patient reports 7-10 days of constant central chest pain that she describes as a burning sensation radiating down her left arm. This morning pain woke her up from her sleep which made her concerned enough to come to the emergency room. She also reports that this morning the pain was more severe and she felt sweaty with it. She denies dizziness, shortness of breath. She endorses a few episodes of nausea and vomiting throughout the course of the last 10 days. Patient has been taking ranitidine and Prilosec home. She is followed by Dr. Nehemiah Massed, cardiology but has not had a recent stress test or LHC. She is a former smoker and stopped smoking more than 30 years ago. She has history of ischemic heart disease in her father. She denies diarrhea, melena, fever, chills, cough. She also reports that she feels like her chest is sore to the touch in the center.  Past Medical History:  Diagnosis Date  . B12 deficiency   . Chicken pox   . Diabetes mellitus ([place])   . GERD (gastroesophageal reflux disease)   . History of diverticulitis of colon   . Hypercholesterolemia   . Hypertension     Patient Active Problem List   Diagnosis Date Noted  . History of colonic polyps 08/09/2016  . Trochanteric bursitis of left hip 12/21/2015  . Abdominal pain 12/04/2015  . Neck nodule 03/01/2015  . Health care maintenance 11/03/2014  . Obesity (BMI 30-39.9) 07/01/2014  . Right shoulder pain 06/28/2014  . Cough 02/26/2014  . Left shoulder pain 06/04/2013  . Right foot pain 06/04/2013  . Osteopenia 01/25/2013  .  Abnormal mammogram 09/13/2012  . Hypertension 06/19/2012  . GERD (gastroesophageal reflux disease) 06/19/2012  . Diabetes mellitus ([place]) 06/07/2012  . Hypercholesterolemia 06/07/2012    Past Surgical History:  Procedure Laterality Date  . ABDOMINAL HYSTERECTOMY  1984  . BACK SURGERY  1986   ruptured disc, Dr Hardin Negus [facility])  . CHOLECYSTECTOMY  1991  . CHOLECYSTECTOMY  1992  . COLONOSCOPY WITH PROPOFOL N/A 06/29/2016   Procedure: COLONOSCOPY WITH PROPOFOL;  Surgeon: Manya Silvas, MD;  Location: [facility] ENDOSCOPY;  Service: Endoscopy;  Laterality: N/A;  . TONSILLECTOMY      Prior to Admission medications   Medication Sig Start Date End Date Taking? Authorizing Provider  aspirin 81 MG tablet Take 81 mg by mouth daily.   Yes Historical Provider, MD  benazepril (LOTENSIN) 40 MG tablet TAKE ONE TABLET BY MOUTH ONCE DAILY 04/07/16  Yes [name], MD  beta carotene w/minerals (OCUVITE) tablet Take 1 tablet by mouth daily.   Yes Historical Provider, MD  Calcium Carbonate-Vitamin D (CALCIUM 600+D) 600-400 MG-UNIT per tablet Take 1 tablet by mouth 2 (two) times daily.   Yes Historical Provider, MD  Cholecalciferol (VITAMIN D) 2000 UNITS tablet Take 1,000 Units by mouth daily.    Yes Historical Provider, MD  cyanocobalamin (,VITAMIN B-12,) 1000 MCG/ML injection INJECT 1 ML (1,000 MCG TOTAL) INTO THE MUSCLE EVERY 30 (THIRTY) DAYS. 09/09/16  Yes [name], MD  fish oil-omega-3 fatty acids 1000 MG capsule Take 1 g by mouth 2 (two) times daily.   Yes  Historical Provider, MD  hydrochlorothiazide (HYDRODIURIL) 25 MG tablet TAKE ONE-HALF TABLET BY MOUTH ONCE DAILY 08/20/16  Yes [name], MD  Melatonin 5 MG TABS Take 1 tablet by mouth at bedtime.   Yes Historical Provider, MD  metFORMIN (GLUCOPHAGE) 500 MG tablet TAKE ONE TABLET BY MOUTH ONCE DAILY 07/02/16  Yes [name], MD  niacin 100 MG tablet Take 100 mg by mouth at bedtime.   Yes Historical Provider, MD  alum & mag  hydroxide-simeth (MAALOX MAX) 400-400-40 MG/5ML suspension Take 5 mLs by mouth every 6 (six) hours as needed for indigestion. 09/13/16   Rudene Re, MD  BAYER CONTOUR TEST test strip USE ONE STRIP TO CHECK GLUCOSE ONCE DAILY 06/17/16   [name], MD  glucose blood (BAYER CONTOUR TEST) test strip 1 each by Other route daily. Use as instructed 06/25/16   [name], MD  Lancets MISC Check sugars once a day Dx: E11.9 (Contour Next) 07/04/15   [name], MD  omeprazole (PRILOSEC OTC) 20 MG tablet Take 1 tablet (20 mg total) by mouth 2 (two) times daily. 09/13/16   Rudene Re, MD    Allergies Augmentin [amoxicillin-pot clavulanate]  Family History  Problem Relation Age of Onset  . Hypertension Mother   . Cancer Mother     Mouth cancer  . Heart disease Father     myocardial infarction  . Raynaud syndrome Sister   . Thyroid disease Sister   . Asthma Sister   . Breast cancer Neg Hx   . Colon cancer Neg Hx     Social History Social History  Substance Use Topics  . Smoking status: Former Smoker    Packs/day: 0.50    Years: 20.00    Quit date: 07/26/1984  . Smokeless tobacco: Never Used     Comment: quit smoking years ago  . Alcohol use No    Review of Systems  Constitutional: Negative for fever. Eyes: Negative for visual changes. ENT: Negative for sore throat. Neck: No neck pain  Cardiovascular: + burning chest pain. Respiratory: Negative for shortness of breath. Gastrointestinal: Negative for abdominal pain,  Diarrhea. + N.V Genitourinary: Negative for dysuria. Musculoskeletal: Negative for back pain. Skin: Negative for rash. Neurological: Negative for headaches, weakness or numbness. Psych: No SI or HI  ____________________________________________   PHYSICAL EXAM:  VITAL SIGNS: Vitals:   09/13/16 0947 09/13/16 1030  BP: 131/65 (!) 147/63  Pulse: 61 62  Resp: 13 13  Temp: 98.4 F (36.9 C)    Constitutional: Alert and oriented. Well  appearing and in no apparent distress. HEENT:      Head: Normocephalic and atraumatic.         Eyes: Conjunctivae are normal. Sclera is non-icteric. EOMI. PERRL      Mouth/Throat: Mucous membranes are moist.       Neck: Supple with no signs of meningismus. Cardiovascular: Regular rate and rhythm. No murmurs, gallops, or rubs. 2+ symmetrical distal pulses are present in all extremities. No JVD. Respiratory: Normal respiratory effort. Lungs are clear to auscultation bilaterally. No wheezes, crackles, or rhonchi.  Gastrointestinal: Soft, non tender, and non distended with positive bowel sounds. No rebound or guarding. Genitourinary: No CVA tenderness. Musculoskeletal: Nontender with normal range of motion in all extremities. No edema, cyanosis, or erythema of extremities. Neurologic: Normal speech and language. Face is symmetric. Moving all extremities. No gross focal neurologic deficits are appreciated. Skin: Skin is warm, dry and intact. No rash noted. Psychiatric: Mood and affect are normal. Speech and behavior  are normal.  ____________________________________________   LABS (all labs ordered are listed, but only abnormal results are displayed)  Labs Reviewed  CBC WITH DIFFERENTIAL/PLATELET - Abnormal; Notable for the following:       Result Value   Neutro Abs 7.4 (*)    All other components within normal limits  BASIC METABOLIC PANEL - Abnormal; Notable for the following:    Chloride 100 (*)    Glucose, Bld 138 (*)    Creatinine, Ser 1.19 (*)    GFR calc non Af Amer 43 (*)    GFR calc Af Amer 50 (*)    All other components within normal limits  TROPONIN I  TROPONIN I   ____________________________________________  EKG  ED ECG REPORT I, Rudene Re, the attending physician, personally viewed and interpreted this ECG.  Normal sinus rhythm, rate of 83, normal intervals, normal axis, no ST elevations or depressions, flattening T wave in lead 3. Unchanged from prior from  2013  ____________________________________________  RADIOLOGY  CXR:  Minimal LEFT basilar scarring.  Aortic atherosclerosis. No acute abnormalities. ____________________________________________   PROCEDURES  Procedure(s) performed: None Procedures Critical Care performed:  None ____________________________________________   INITIAL IMPRESSION / ASSESSMENT AND PLAN / ED COURSE  76 y.o. female with a history of hypertension, hyperlipidemia, diabetes, GERD who presents for evaluation of atypical chest burning x 7-10 days. The patient is well-appearing and in no distress, EKG with no ischemic changes.  Do not believe at this time the patient's presentation, EKG, and history are consistent with ACS however will watch closely on telemetry and cycle cardiac enzymes. Patient has taken a baby aspirin this morning we'll give her 3 more. We'll treat for reflux with IV Pepcid, GI cocktail, IV fluids, and IV Zofran. We'll check blood work and a chest x-ray.   ED COURSE:  Chest pain resolved with GI cocktail, IV Pepcid and IV fluids. Patient was monitored in the emergency department for several hours without recurrence of her chest pain. Troponin 2 is negative. Patient be discharged home with close follow-up with her cardiologist to renew her stress tests. I am also increasing her dose of Prilosec to twice a day and given her a prescription for Maalox.   Pertinent labs & imaging results that were available during my care of the patient were reviewed by me and considered in my medical decision making (see chart for details).    ____________________________________________   FINAL CLINICAL IMPRESSION(S) / ED DIAGNOSES  Final diagnoses:  Chest pain, unspecified type      NEW MEDICATIONS STARTED DURING THIS VISIT:  New Prescriptions   ALUM & MAG HYDROXIDE-SIMETH (MAALOX MAX) [id number] MG/5ML SUSPENSION    Take 5 mLs by mouth every 6 (six) hours as needed for indigestion.     Note:   This document was prepared using Dragon voice recognition software and may include unintentional dictation errors.    Rudene Re, MD 09/13/16 [phone]

## 2016-09-13 NOTE — ED Triage Notes (Signed)
Patient presents to the ED with left sided chest pain that radiates down her left arm.  Patient reports that pain woke her up from sleep this morning and that she has been nauseous and sweaty with the chest pain.  Patient denies shortness of breath.  Patient reports history of chest pain and sees a cardiologist regularly.  Patient is alert and oriented x 4.  No obvious distress at this time.

## 2016-09-15 ENCOUNTER — Other Ambulatory Visit: Payer: Self-pay | Admitting: Internal Medicine

## 2016-09-15 NOTE — Telephone Encounter (Signed)
Yes

## 2016-09-15 NOTE — Telephone Encounter (Signed)
Can we give 6 months

## 2016-09-15 NOTE — Telephone Encounter (Signed)
Pt requested a update on this [facility]ill [facility] [address] [facility] refill. Pt requested a 6 month refill  Pt contact [phone]

## 2016-09-16 ENCOUNTER — Other Ambulatory Visit: Payer: Self-pay | Admitting: Internal Medicine

## 2016-09-25 ENCOUNTER — Encounter: Payer: Self-pay | Admitting: Internal Medicine

## 2016-09-25 ENCOUNTER — Ambulatory Visit: Payer: BLUE CROSS/BLUE SHIELD | Admitting: Internal Medicine

## 2016-09-25 ENCOUNTER — Ambulatory Visit (INDEPENDENT_AMBULATORY_CARE_PROVIDER_SITE_OTHER): Payer: Medicare Other | Admitting: Internal Medicine

## 2016-09-25 VITALS — BP 132/80 | HR 90 | Temp 98.5°F | Resp 16 | Ht 64.0 in | Wt 199.8 lb

## 2016-09-25 DIAGNOSIS — R7989 Other specified abnormal findings of blood chemistry: Secondary | ICD-10-CM | POA: Diagnosis not present

## 2016-09-25 DIAGNOSIS — E7800 Pure hypercholesterolemia, unspecified: Secondary | ICD-10-CM

## 2016-09-25 DIAGNOSIS — E538 Deficiency of other specified B group vitamins: Secondary | ICD-10-CM

## 2016-09-25 DIAGNOSIS — E119 Type 2 diabetes mellitus without complications: Secondary | ICD-10-CM | POA: Diagnosis not present

## 2016-09-25 DIAGNOSIS — K219 Gastro-esophageal reflux disease without esophagitis: Secondary | ICD-10-CM | POA: Diagnosis not present

## 2016-09-25 DIAGNOSIS — R079 Chest pain, unspecified: Secondary | ICD-10-CM | POA: Diagnosis not present

## 2016-09-25 DIAGNOSIS — I10 Essential (primary) hypertension: Secondary | ICD-10-CM | POA: Diagnosis not present

## 2016-09-25 LAB — BASIC METABOLIC PANEL
BUN: 18 mg/dL (ref 6–23)
CO2: 32 meq/L (ref 19–32)
CREATININE: 0.85 mg/dL (ref 0.40–1.20)
Calcium: 9.4 mg/dL (ref 8.4–10.5)
Chloride: 101 mEq/L (ref 96–112)
GFR: 69.04 mL/min (ref >60.00)
Glucose, Bld: 93 mg/dL (ref 70–99)
Potassium: 4.5 mEq/L (ref 3.5–5.1)
SODIUM: 139 meq/L (ref 135–145)

## 2016-09-25 MED ORDER — CYANOCOBALAMIN 1000 MCG/ML IJ SOLN
1000.0000 ug | Freq: Once | INTRAMUSCULAR | Status: AC
  Administered 2016-09-25: 1000 ug via INTRAMUSCULAR

## 2016-09-25 NOTE — Progress Notes (Signed)
Patient ID: Anelis E Woerner, female   DOB: 06/13/1941, 75 y.o.   MRN: [id number]   Subjective:    Patient ID: Damyia E Leiber, female    DOB: 12/02/1940, 75 y.o.   MRN: [id number]  HPI  Patient here for a scheduled follow up.  She was seen in ER on 09/13/16 for chest pain.  Note reviewed.  W/up unrevealing.  Was given GI cocktail.  Pain improved.  On prilosec twice a day now.  No more pain like she experienced prior to ER visit.  Eating.  No abdominal pain now.  Breathing stable.  Bowels doing better.  Some previous constipation.  Handling stress.  Sugars ok.     Past Medical History:  Diagnosis Date  . B12 deficiency   . Chicken pox   . Diabetes mellitus (HCC)   . GERD (gastroesophageal reflux disease)   . History of diverticulitis of colon   . Hypercholesterolemia   . Hypertension    Past Surgical History:  Procedure Laterality Date  . ABDOMINAL HYSTERECTOMY  1984  . BACK SURGERY  1986   ruptured disc, Dr [place] ([place]  . CHOLECYSTECTOMY  1991  . CHOLECYSTECTOMY  1992  . COLONOSCOPY WITH PROPOFOL N/A 06/29/2016   Procedure: COLONOSCOPY WITH PROPOFOL;  Surgeon: Robert T Elliott, MD;  Location: [facility] ENDOSCOPY;  Service: Endoscopy;  Laterality: N/A;  . TONSILLECTOMY     Family History  Problem Relation Age of Onset  . Hypertension Mother   . Cancer Mother     Mouth cancer  . Heart disease Father     myocardial infarction  . Raynaud syndrome Sister   . Thyroid disease Sister   . Asthma Sister   . Breast cancer Neg Hx   . Colon cancer Neg Hx    Social History   Social History  . Marital status: Divorced    Spouse name: N/A  . Number of children: 2  . Years of education: N/A   Occupational History  . retired    Social History Main Topics  . Smoking status: Former Smoker    Packs/day: 0.50    Years: 20.00    Quit date: 07/26/1984  . Smokeless tobacco: Never Used     Comment: quit smoking years ago  . Alcohol use No  . Drug use: No  . Sexual activity: No    Other Topics Concern  . None   Social History Narrative  . None    Outpatient Encounter Prescriptions as of 09/25/2016  Medication Sig  . alum & mag hydroxide-simeth (MAALOX MAX) 400-400-40 MG/5ML suspension Take 5 mLs by mouth every 6 (six) hours as needed for indigestion.  . aspirin 81 MG tablet Take 81 mg by mouth daily.  . BAYER CONTOUR TEST test strip USE ONE STRIP TO CHECK GLUCOSE ONCE DAILY  . benazepril (LOTENSIN) 40 MG tablet TAKE ONE TABLET BY MOUTH ONCE DAILY  . beta carotene w/minerals (OCUVITE) tablet Take 1 tablet by mouth daily.  . Calcium Carbonate-Vitamin D (CALCIUM 600+D) 600-400 MG-UNIT per tablet Take 1 tablet by mouth 2 (two) times daily.  . Cholecalciferol (VITAMIN D) 2000 UNITS tablet Take 1,000 Units by mouth daily.   . cyanocobalamin (,VITAMIN B-12,) 1000 MCG/ML injection INJECT 1 ML (1,000 MCG TOTAL) INTO THE MUSCLE EVERY 30 (THIRTY) DAYS.  . fish oil-omega-3 fatty acids 1000 MG capsule Take 1 g by mouth 2 (two) times daily.  . glucose blood (BAYER CONTOUR TEST) test strip 1 each by Other route daily. Use as   instructed  . hydrochlorothiazide (HYDRODIURIL) 25 MG tablet TAKE ONE-HALF TABLET BY MOUTH ONCE DAILY  . Lancets MISC Check sugars once a day Dx: E11.9 (Contour Next)  . Melatonin 5 MG TABS Take 1 tablet by mouth at bedtime.  . metFORMIN (GLUCOPHAGE) 500 MG tablet TAKE ONE TABLET BY MOUTH ONCE DAILY  . niacin 100 MG tablet Take 100 mg by mouth at bedtime.  Marland Kitchen omeprazole (PRILOSEC OTC) 20 MG tablet Take 1 tablet (20 mg total) by mouth 2 (two) times daily.  . [EXPIRED] cyanocobalamin ((VITAMIN B-12)) injection 1,000 mcg    No facility-administered encounter medications on file as of 09/25/2016.     Review of Systems  Constitutional: Negative for appetite change and unexpected weight change.  HENT: Negative for congestion and sinus pressure.   Respiratory: Negative for cough, chest tightness and shortness of breath.   Cardiovascular: Negative for  palpitations and leg swelling.       Previous chest pain.  Better now.    Gastrointestinal: Negative for abdominal pain, diarrhea, nausea and vomiting.  Genitourinary: Negative for difficulty urinating and dysuria.  Musculoskeletal: Negative for back pain and joint swelling.  Skin: Negative for color change and rash.  Neurological: Negative for dizziness, light-headedness and headaches.  Psychiatric/Behavioral: Negative for agitation and dysphoric mood.       Objective:    Physical Exam  Constitutional: She appears well-developed and well-nourished. No distress.  HENT:  Nose: Nose normal.  Mouth/Throat: Oropharynx is clear and moist.  Neck: Neck supple. No thyromegaly present.  Cardiovascular: Normal rate and regular rhythm.   Pulmonary/Chest: Breath sounds normal. No respiratory distress. She has no wheezes.  Abdominal: Soft. Bowel sounds are normal. There is no tenderness.  Musculoskeletal: She exhibits no edema or tenderness.  Lymphadenopathy:    She has no cervical adenopathy.  Skin: No rash noted. No erythema.  Psychiatric: She has a normal mood and affect. Her behavior is normal.    BP 132/80 (BP Location: Left Arm, Patient Position: Sitting, Cuff Size: Large)   Pulse 90   Temp 98.5 F (36.9 C) (Oral)   Resp 16   Ht 5' 4" (1.626 m)   Wt 199 lb 12.8 oz (90.6 kg)   SpO2 95%   BMI 34.30 kg/m  Wt Readings from Last 3 Encounters:  09/25/16 199 lb 12.8 oz (90.6 kg)  09/13/16 196 lb (88.9 kg)  08/06/16 203 lb 9.6 oz (92.4 kg)     Lab Results  Component Value Date   WBC 10.5 09/13/2016   HGB 13.9 09/13/2016   HCT 42.1 09/13/2016   PLT 282 09/13/2016   GLUCOSE 93 09/25/2016   CHOL 228 (H) 08/06/2016   TRIG 136.0 08/06/2016   HDL 50.00 08/06/2016   LDLDIRECT 171.2 05/26/2013   LDLCALC 151 (H) 08/06/2016   ALT 16 08/06/2016   AST 15 08/06/2016   NA 139 09/25/2016   K 4.5 09/25/2016   CL 101 09/25/2016   CREATININE 0.85 09/25/2016   BUN 18 09/25/2016   CO2  32 09/25/2016   TSH 2.81 08/06/2016   INR 0.9 04/03/2012   HGBA1C 6.5 08/06/2016   MICROALBUR <0.7 08/06/2016    Dg Chest 2 View  Result Date: 09/13/2016 CLINICAL DATA:  LEFT side chest pain radiating down LEFT arm, awoke patient from sleep this morning, nausea, diaphoresis, history diabetes mellitus, hypertension EXAM: CHEST  2 VIEW COMPARISON:  04/03/2012 FINDINGS: Normal heart size, mediastinal contours, and pulmonary vascularity. Atherosclerotic calcification aorta. Minimal linear scarring at LEFT costophrenic angle.  Lungs otherwise clear. Calcified granuloma at RIGHT apex unchanged. No pleural effusion or pneumothorax. Bones demineralized. IMPRESSION: Minimal LEFT basilar scarring. Aortic atherosclerosis.  No acute abnormalities. Electronically Signed   By: Mark  Boles M.D.   On: 09/13/2016 08:41       Assessment & Plan:   Problem List Items Addressed This Visit    Chest pain - Primary    Recently evaluated in ER for chest pain.  W/up in ER unrevealing.  Recommended f/u with cardiology.  Discussed with her today.  Symptoms better.  Refer to cardiology for further evaluation and question of need for stress test.  Continue prilosec bid.  Refer to GI for question of need for EGD (once cardiology issues sorted through).        Relevant Orders   Ambulatory referral to Gastroenterology   Ambulatory referral to Cardiology   Diabetes mellitus (HCC)    Low carb diet and exercise.  Follow met b and a1c.        GERD (gastroesophageal reflux disease)    With recent chest pain as outlined.  Will complete cardiac w/up as outlined.  On prilosec bid now.  Better.  Refer to GI for evaluation and question of need for f/u EGD.        Relevant Orders   Ambulatory referral to Gastroenterology   Hypercholesterolemia    Low cholesterol diet and exercise.  Did not tolerate statin medication. Follow lipid panel and liver function tests.        Hypertension    Blood pressure under good control.   Continue same medication regimen.  Follow pressures.  Follow metabolic panel.         Other Visit Diagnoses    B12 deficiency       Relevant Medications   cyanocobalamin ((VITAMIN B-12)) injection 1,000 mcg (Completed)   Elevated serum creatinine       Relevant Orders   Basic metabolic panel (Completed)       SCOTT, CHARLENE, MD  

## 2016-09-25 NOTE — Progress Notes (Signed)
Pre-visit discussion using our clinic review tool. No additional management support is needed unless otherwise documented below in the visit note.  

## 2016-09-27 ENCOUNTER — Encounter: Payer: Self-pay | Admitting: Internal Medicine

## 2016-09-27 DIAGNOSIS — R079 Chest pain, unspecified: Secondary | ICD-10-CM | POA: Insufficient documentation

## 2016-09-27 NOTE — Assessment & Plan Note (Signed)
Low cholesterol diet and exercise.  Did not tolerate statin medication. Follow lipid panel and liver function tests.

## 2016-09-27 NOTE — Assessment & Plan Note (Signed)
Low carb diet and exercise.  Follow met b and a1c.   

## 2016-09-27 NOTE — Assessment & Plan Note (Signed)
Recently evaluated in ER for chest pain.  W/up in ER unrevealing.  Recommended f/u with cardiology.  Discussed with her today.  Symptoms better.  Refer to cardiology for further evaluation and question of need for stress test.  Continue prilosec bid.  Refer to GI for question of need for EGD (once cardiology issues sorted through).

## 2016-09-27 NOTE — Assessment & Plan Note (Signed)
Blood pressure under good control.  Continue same medication regimen.  Follow pressures.  Follow metabolic panel.   

## 2016-09-27 NOTE — Assessment & Plan Note (Signed)
With recent chest pain as outlined.  Will complete cardiac w/up as outlined.  On prilosec bid now.  Better.  Refer to GI for evaluation and question of need for f/u EGD.

## 2016-09-28 ENCOUNTER — Other Ambulatory Visit: Payer: Self-pay | Admitting: Internal Medicine

## 2016-09-28 NOTE — Telephone Encounter (Signed)
Last B12 level was drawn in 2014, please advise for refill? thanks

## 2016-10-05 DIAGNOSIS — E782 Mixed hyperlipidemia: Secondary | ICD-10-CM | POA: Diagnosis not present

## 2016-10-05 DIAGNOSIS — E119 Type 2 diabetes mellitus without complications: Secondary | ICD-10-CM | POA: Diagnosis not present

## 2016-10-05 DIAGNOSIS — K219 Gastro-esophageal reflux disease without esophagitis: Secondary | ICD-10-CM | POA: Diagnosis not present

## 2016-10-05 DIAGNOSIS — I10 Essential (primary) hypertension: Secondary | ICD-10-CM | POA: Diagnosis not present

## 2016-10-05 DIAGNOSIS — R079 Chest pain, unspecified: Secondary | ICD-10-CM | POA: Diagnosis not present

## 2016-11-04 ENCOUNTER — Other Ambulatory Visit: Payer: Self-pay | Admitting: Internal Medicine

## 2016-11-06 DIAGNOSIS — R0789 Other chest pain: Secondary | ICD-10-CM | POA: Diagnosis not present

## 2016-11-06 DIAGNOSIS — R079 Chest pain, unspecified: Secondary | ICD-10-CM | POA: Diagnosis not present

## 2016-11-06 DIAGNOSIS — E782 Mixed hyperlipidemia: Secondary | ICD-10-CM | POA: Diagnosis not present

## 2016-11-06 DIAGNOSIS — I10 Essential (primary) hypertension: Secondary | ICD-10-CM | POA: Diagnosis not present

## 2016-12-11 ENCOUNTER — Ambulatory Visit (INDEPENDENT_AMBULATORY_CARE_PROVIDER_SITE_OTHER): Payer: Medicare Other

## 2016-12-11 ENCOUNTER — Other Ambulatory Visit (INDEPENDENT_AMBULATORY_CARE_PROVIDER_SITE_OTHER): Payer: Medicare Other

## 2016-12-11 VITALS — BP 130/70 | HR 80 | Temp 98.0°F | Resp 14 | Ht 64.0 in | Wt 195.8 lb

## 2016-12-11 DIAGNOSIS — E7800 Pure hypercholesterolemia, unspecified: Secondary | ICD-10-CM

## 2016-12-11 DIAGNOSIS — Z0000 Encounter for general adult medical examination without abnormal findings: Secondary | ICD-10-CM | POA: Diagnosis not present

## 2016-12-11 DIAGNOSIS — E119 Type 2 diabetes mellitus without complications: Secondary | ICD-10-CM | POA: Diagnosis not present

## 2016-12-11 LAB — HEPATIC FUNCTION PANEL
ALT: 14 U/L (ref 0–35)
AST: 14 U/L (ref 0–37)
Albumin: 4.1 g/dL (ref 3.5–5.2)
Alkaline Phosphatase: 57 U/L (ref 39–117)
BILIRUBIN DIRECT: 0.1 mg/dL (ref 0.0–0.3)
TOTAL PROTEIN: 6.6 g/dL (ref 6.0–8.3)
Total Bilirubin: 0.5 mg/dL (ref 0.2–1.2)

## 2016-12-11 LAB — BASIC METABOLIC PANEL
BUN: 20 mg/dL (ref 6–23)
CHLORIDE: 103 meq/L (ref 96–112)
CO2: 33 mEq/L — ABNORMAL HIGH (ref 19–32)
CREATININE: 0.92 mg/dL (ref 0.40–1.20)
Calcium: 9.5 mg/dL (ref 8.4–10.5)
GFR: 62.98 mL/min (ref >60.00)
Glucose, Bld: 133 mg/dL — ABNORMAL HIGH (ref 70–99)
Potassium: 4.6 mEq/L (ref 3.5–5.1)
Sodium: 141 mEq/L (ref 135–145)

## 2016-12-11 LAB — HEMOGLOBIN A1C: HEMOGLOBIN A1C: 6.7 % — AB (ref 4.6–6.5)

## 2016-12-11 LAB — LIPID PANEL
CHOL/HDL RATIO: 5
Cholesterol: 241 mg/dL — ABNORMAL HIGH (ref 0–200)
HDL: 53.2 mg/dL (ref >39.00)
LDL Cholesterol: 158 mg/dL — ABNORMAL HIGH (ref 0–99)
NonHDL: 187.32
Triglycerides: 147 mg/dL (ref 0.0–149.0)
VLDL: 29.4 mg/dL (ref 0.0–40.0)

## 2016-12-11 NOTE — Progress Notes (Signed)
Subjective:   Misty Jones is a 76 y.o. female who presents for Medicare Annual (Subsequent) preventive examination.  Review of Systems:  No ROS.  Medicare Wellness Visit.  Cardiac Risk Factors include: hypertension;advanced age (>34men, >76 women);diabetes mellitus     Objective:     Vitals: BP 130/70 (BP Location: Right Arm, Patient Position: Sitting, Cuff Size: Normal)   Pulse 80   Temp 98 F (36.7 C) (Oral)   Resp 14   Ht 5\' 4"  (1.626 m)   Wt 195 lb 12.8 oz (88.8 kg)   SpO2 98%   BMI 33.61 kg/m   Body mass index is 33.61 kg/m.   Tobacco History  Smoking Status  . Former Smoker  . Packs/day: 0.50  . Years: 20.00  . Quit date: 07/26/1984  Smokeless Tobacco  . Never Used    Comment: quit smoking years ago     Counseling given: Not Answered   Past Medical History:  Diagnosis Date  . B12 deficiency   . Chicken pox   . Diabetes mellitus ([place])   . GERD (gastroesophageal reflux disease)   . History of diverticulitis of colon   . Hypercholesterolemia   . Hypertension    Past Surgical History:  Procedure Laterality Date  . ABDOMINAL HYSTERECTOMY  1984  . BACK SURGERY  1986   ruptured disc, Dr Hardin Negus [facility])  . CHOLECYSTECTOMY  1991  . CHOLECYSTECTOMY  1992  . COLONOSCOPY WITH PROPOFOL N/A 06/29/2016   Procedure: COLONOSCOPY WITH PROPOFOL;  Surgeon: Manya Silvas, MD;  Location: [facility] ENDOSCOPY;  Service: Endoscopy;  Laterality: N/A;  . TONSILLECTOMY     Family History  Problem Relation Age of Onset  . Hypertension Mother   . Cancer Mother        Mouth cancer  . Heart disease Father        myocardial infarction  . Raynaud syndrome Sister   . Asthma Sister   . Congenital heart disease Sister   . Thyroid disease Sister   . Thyroid cancer Daughter   . Breast cancer Neg Hx   . Colon cancer Neg Hx    History  Sexual Activity  . Sexual activity: No    Outpatient Encounter Prescriptions as of 12/11/2016  Medication Sig  . aspirin 81 MG  tablet Take 81 mg by mouth daily.  Marland Kitchen BAYER CONTOUR TEST test strip USE ONE STRIP TO CHECK GLUCOSE ONCE DAILY  . benazepril (LOTENSIN) 40 MG tablet TAKE ONE TABLET BY MOUTH ONCE DAILY  . beta carotene w/minerals (OCUVITE) tablet Take 1 tablet by mouth daily.  . Calcium Carbonate-Vitamin D (CALCIUM 600+D) 600-400 MG-UNIT per tablet Take 1 tablet by mouth 2 (two) times daily.  . Cholecalciferol (VITAMIN D) 2000 UNITS tablet Take 1,000 Units by mouth daily.   . cyanocobalamin (,VITAMIN B-12,) 1000 MCG/ML injection INJECT 1 ML (1,000 MCG TOTAL) INTO THE MUSCLE EVERY 30 (THIRTY) DAYS.  . fish oil-omega-3 fatty acids 1000 MG capsule Take 1 g by mouth 2 (two) times daily.  Marland Kitchen glucose blood (BAYER CONTOUR TEST) test strip 1 each by Other route daily. Use as instructed  . hydrochlorothiazide (HYDRODIURIL) 25 MG tablet TAKE ONE-HALF TABLET BY MOUTH ONCE DAILY  . Lancets MISC Check sugars once a day Dx: E11.9 (Contour Next)  . metFORMIN (GLUCOPHAGE) 500 MG tablet TAKE ONE TABLET BY MOUTH ONCE DAILY  . niacin 100 MG tablet Take 100 mg by mouth at bedtime.  Marland Kitchen omeprazole (PRILOSEC OTC) 20 MG tablet Take 1  tablet (20 mg total) by mouth 2 (two) times daily.  . [DISCONTINUED] alum & mag hydroxide-simeth (MAALOX MAX) 400-400-40 MG/5ML suspension Take 5 mLs by mouth every 6 (six) hours as needed for indigestion.  . [DISCONTINUED] Melatonin 5 MG TABS Take 1 tablet by mouth at bedtime.   No facility-administered encounter medications on file as of 12/11/2016.     Activities of Daily Living In your present state of health, do you have any difficulty performing the following activities: 12/11/2016 03/25/2016  Hearing? N Y  Vision? N N  Difficulty concentrating or making decisions? N N  Walking or climbing stairs? N N  Dressing or bathing? N N  Doing errands, shopping? N N  Preparing Food and eating ? N -  Using the Toilet? N -  In the past six months, have you accidently leaked urine? N -  Do you have problems  with loss of bowel control? N -  Managing your Medications? N -  Managing your Finances? N -  Housekeeping or managing your Housekeeping? N -  Some recent data might be hidden    Patient Care Team: [name], MD as PCP - General (Internal Medicine) [name], MD (Internal Medicine)    Assessment:    This is a routine wellness examination for Misty Jones. The goal of the wellness visit is to assist the patient how to close the gaps in care and create a preventative care plan for the patient.   Taking calcium VIT D as appropriate/Osteoporosis risk reviewed.  Medications reviewed; taking without issues or barriers.  Safety issues reviewed; smoke detectors in the home. No firearms in the home.  Wears seatbelts when driving or riding with others. Patient does wear sunscreen or protective clothing when in direct sunlight. No violence in the home.  Depression- PHQ 2 &9 complete.  No signs/symptoms or verbal communication regarding little pleasure in doing things, feeling down, depressed or hopeless. No changes in sleeping, energy, eating, concentrating.  No thoughts of self harm or harm towards others.  Time spent on this topic is 8 minutes.   Patient is alert, normal appearance, oriented to person/place/and time. Correctly identified the president of the [place], recall of 3/3 words, and performing simple calculations.  Patient displays appropriate judgement and can read correct time from watch face.  No new identified risk were noted.  No failures at ADL's or IADL's.   BMI- discussed the importance of a healthy diet, water intake and exercise. Educational material provided.   Daily fluid intake: 3 cups of caffeine, 2 cups of water  HTN- followed by PCP.  Dental- every six months.  Dr. Jacobo Forest.  Eye- Visual acuity not assessed per patient preference since they have regular follow up with the ophthalmologist.  Wears corrective lenses.  Sleep patterns- Sleeps 6 hours at night.   Wakes feeling rested.  Naps during the day.  Health maintenance gaps- closed.  Patient Concerns: None at this time. Follow up with PCP as needed.  Exercise Activities and Dietary recommendations Current Exercise Habits: The patient does not participate in regular exercise at present  Goals    . Healthy Lifestyle          Stay hydrated and drink plenty of fluids/water. Low carb foods.  Lean meats and vegetables.     . Increase physical activity          Walk for exercise      Fall Risk Fall Risk  12/11/2016 03/25/2016 12/12/2015 04/03/2015 03/01/2015  Falls in the past  year? Yes No No No No   Depression Screen PHQ 2/9 Scores 12/11/2016 03/25/2016 12/12/2015 04/03/2015  PHQ - 2 Score 0 0 0 0  PHQ- 9 Score 0 - - -     Cognitive Function MMSE - Mini Mental State Exam 12/11/2016 12/12/2015  Orientation to time 5 5  Orientation to Place 5 5  Registration 3 3  Attention/ Calculation 5 5  Recall 3 3  Language- name 2 objects 2 2  Language- repeat 1 1  Language- follow 3 step command 3 3  Language- read & follow direction 1 1  Write a sentence 1 1  Copy design 1 1  Total score 30 30        Immunization History  Administered Date(s) Administered  . Influenza Split 05/31/2012, 04/16/2014  . Influenza, High Dose Seasonal PF 03/25/2016  . Influenza-Unspecified 05/08/2015  . Pneumococcal Conjugate-13 05/30/2013  . Pneumococcal Polysaccharide-23 12/12/2015  . Zoster 06/28/2012   Screening Tests Health Maintenance  Topic Date Due  . FOOT EXAM  12/03/2016  . HEMOGLOBIN A1C  02/03/2017  . INFLUENZA VACCINE  02/24/2017  . OPHTHALMOLOGY EXAM  04/10/2017  . MAMMOGRAM  04/14/2017  . TETANUS/TDAP  12/29/2020  . DEXA SCAN  Completed  . PNA vac Low Risk Adult  Completed      Plan:    End of life planning; Advance aging; Advanced directives discussed. Copy of current HCPOA/Living Will requested.    I have personally reviewed and noted the following in the patient's chart:    . Medical and social history . Use of alcohol, tobacco or illicit drugs  . Current medications and supplements . Functional ability and status . Nutritional status . Physical activity . Advanced directives . List of other physicians . Hospitalizations, surgeries, and ER visits in previous 12 months . Vitals . Screenings to include cognitive, depression, and falls . Referrals and appointments  In addition, I have reviewed and discussed with patient certain preventive protocols, quality metrics, and best practice recommendations. A written personalized care plan for preventive services as well as general preventive health recommendations were provided to patient.     Varney Biles, LPN  [id number]/83/0940

## 2016-12-11 NOTE — Patient Instructions (Addendum)
  Ms. Misty Jones , Thank you for taking time to come for your Medicare Wellness Visit. I appreciate your ongoing commitment to your health goals. Please review the following plan we discussed and let me know if I can assist you in the future.   Follow up with Dr. Nicki Reaper as needed.    Bring a copy of your [place] and/or Living Will to be scanned into chart.  Have a great day!  These are the goals we discussed: Goals    . Healthy Lifestyle          Stay hydrated and drink plenty of fluids/water. Low carb foods.  Lean meats and vegetables.     . Increase physical activity          Walk for exercise       This is a list of the screening recommended for you and due dates:  Health Maintenance  Topic Date Due  . Complete foot exam   12/03/2016  . Hemoglobin A1C  02/03/2017  . Flu Shot  02/24/2017  . Eye exam for diabetics  04/10/2017  . Mammogram  04/14/2017  . Tetanus Vaccine  12/29/2020  . DEXA scan (bone density measurement)  Completed  . Pneumonia vaccines  Completed

## 2016-12-14 ENCOUNTER — Other Ambulatory Visit: Payer: BLUE CROSS/BLUE SHIELD

## 2016-12-15 NOTE — Progress Notes (Signed)
  I have reviewed the above information and agree with above.   Kambryn Dapolito, MD 

## 2016-12-17 ENCOUNTER — Encounter: Payer: Self-pay | Admitting: Internal Medicine

## 2016-12-17 ENCOUNTER — Ambulatory Visit (INDEPENDENT_AMBULATORY_CARE_PROVIDER_SITE_OTHER): Payer: Medicare Other | Admitting: Internal Medicine

## 2016-12-17 VITALS — BP 132/72 | HR 81 | Temp 98.6°F | Resp 12 | Ht 64.0 in | Wt 193.6 lb

## 2016-12-17 DIAGNOSIS — M79604 Pain in right leg: Secondary | ICD-10-CM | POA: Diagnosis not present

## 2016-12-17 DIAGNOSIS — E669 Obesity, unspecified: Secondary | ICD-10-CM | POA: Diagnosis not present

## 2016-12-17 DIAGNOSIS — I700 Atherosclerosis of aorta: Secondary | ICD-10-CM | POA: Diagnosis not present

## 2016-12-17 DIAGNOSIS — E119 Type 2 diabetes mellitus without complications: Secondary | ICD-10-CM | POA: Diagnosis not present

## 2016-12-17 DIAGNOSIS — K219 Gastro-esophageal reflux disease without esophagitis: Secondary | ICD-10-CM | POA: Diagnosis not present

## 2016-12-17 DIAGNOSIS — E7800 Pure hypercholesterolemia, unspecified: Secondary | ICD-10-CM

## 2016-12-17 DIAGNOSIS — M79605 Pain in left leg: Secondary | ICD-10-CM

## 2016-12-17 DIAGNOSIS — I10 Essential (primary) hypertension: Secondary | ICD-10-CM | POA: Diagnosis not present

## 2016-12-17 MED ORDER — CYANOCOBALAMIN 1000 MCG/ML IJ SOLN: INTRAMUSCULAR | 1 refills | Status: DC

## 2016-12-17 NOTE — Progress Notes (Signed)
Pre-visit discussion using our clinic review tool. No additional management support is needed unless otherwise documented below in the visit note.  

## 2016-12-17 NOTE — Progress Notes (Signed)
Patient ID: Misty Jones, female   DOB: 26-Feb-1941, 76 y.o.   MRN: [id number]   Subjective:    Patient ID: Misty Jones, female    DOB: [birth year], 76 y.o.   MRN: [id number]  HPI  Patient here for a scheduled follow up.  She reports she is doing relatively well.  Just saw cardiology.  Had negative stress test.  No chest pain or upper GI issues since increasing prilosec to bid.  Since she was symptom free, she started taking prilosec q day - two weeks ago.  Symptoms are controlled on this.  No acid reflux.  No chest pain.  No sob.  Had some abdominal pain last night after eating strawberries.  Resolved now.  Has not been an issue.  Overall feels she is doing well.  Does report some bilateral lower extremity pain - aching. Notices in the evening.  Has varicose veins.  No pain with ambulation.     Past Medical History:  Diagnosis Date  . B12 deficiency   . Chicken pox   . Diabetes mellitus ([place])   . GERD (gastroesophageal reflux disease)   . History of diverticulitis of colon   . Hypercholesterolemia   . Hypertension    Past Surgical History:  Procedure Laterality Date  . ABDOMINAL HYSTERECTOMY  1984  . BACK SURGERY  1986   ruptured disc, Dr Hardin Negus [facility])  . CHOLECYSTECTOMY  1991  . CHOLECYSTECTOMY  1992  . COLONOSCOPY WITH PROPOFOL N/A 06/29/2016   Procedure: COLONOSCOPY WITH PROPOFOL;  Surgeon: Manya Silvas, MD;  Location: [facility] ENDOSCOPY;  Service: Endoscopy;  Laterality: N/A;  . TONSILLECTOMY     Family History  Problem Relation Age of Onset  . Hypertension Mother   . Cancer Mother        Mouth cancer  . Heart disease Father        myocardial infarction  . Raynaud syndrome Sister   . Asthma Sister   . Congenital heart disease Sister   . Thyroid disease Sister   . Thyroid cancer Daughter   . Breast cancer Neg Hx   . Colon cancer Neg Hx    Social History   Social History  . Marital status: Divorced    Spouse name: N/A  . Number of children: 2  . Years of  education: N/A   Occupational History  . retired    Social History Main Topics  . Smoking status: Former Smoker    Packs/day: 0.50    Years: 20.00    Quit date: 07/26/1984  . Smokeless tobacco: Never Used     Comment: quit smoking years ago  . Alcohol use No  . Drug use: No  . Sexual activity: No   Other Topics Concern  . None   Social History Narrative  . None    Outpatient Encounter Prescriptions as of 12/17/2016  Medication Sig  . aspirin 81 MG tablet Take 81 mg by mouth daily.  Marland Kitchen BAYER CONTOUR TEST test strip USE ONE STRIP TO CHECK GLUCOSE ONCE DAILY  . benazepril (LOTENSIN) 40 MG tablet TAKE ONE TABLET BY MOUTH ONCE DAILY  . beta carotene w/minerals (OCUVITE) tablet Take 1 tablet by mouth daily.  . Calcium Carbonate-Vitamin D (CALCIUM 600+D) 600-400 MG-UNIT per tablet Take 1 tablet by mouth 2 (two) times daily.  . Cholecalciferol (VITAMIN D) 2000 UNITS tablet Take 1,000 Units by mouth daily.   . cyanocobalamin (,VITAMIN B-12,) 1000 MCG/ML injection INJECT 1 ML (1,000 MCG TOTAL) INTO THE  MUSCLE EVERY 30 (THIRTY) DAYS.  . fish oil-omega-3 fatty acids 1000 MG capsule Take 1 g by mouth 2 (two) times daily.  Marland Kitchen glucose blood (BAYER CONTOUR TEST) test strip 1 each by Other route daily. Use as instructed  . hydrochlorothiazide (HYDRODIURIL) 25 MG tablet TAKE ONE-HALF TABLET BY MOUTH ONCE DAILY  . Lancets MISC Check sugars once a day Dx: E11.9 (Contour Next)  . metFORMIN (GLUCOPHAGE) 500 MG tablet TAKE ONE TABLET BY MOUTH ONCE DAILY  . niacin 100 MG tablet Take 100 mg by mouth at bedtime.  Marland Kitchen omeprazole (PRILOSEC OTC) 20 MG tablet Take 1 tablet (20 mg total) by mouth 2 (two) times daily.  . [DISCONTINUED] cyanocobalamin (,VITAMIN B-12,) 1000 MCG/ML injection INJECT 1 ML (1,000 MCG TOTAL) INTO THE MUSCLE EVERY 30 (THIRTY) DAYS.   No facility-administered encounter medications on file as of 12/17/2016.     Review of Systems  Constitutional: Negative for appetite change and  unexpected weight change.  HENT: Negative for congestion and sinus pressure.   Respiratory: Negative for cough, chest tightness and shortness of breath.   Cardiovascular: Negative for chest pain, palpitations and leg swelling.  Gastrointestinal: Negative for abdominal pain, diarrhea, nausea and vomiting.  Genitourinary: Negative for difficulty urinating and dysuria.  Musculoskeletal: Negative for joint swelling.       Bilateral lower extremity aching.  Worse at night.    Skin: Negative for color change and rash.  Neurological: Negative for dizziness, light-headedness and headaches.  Psychiatric/Behavioral: Negative for agitation and dysphoric mood.       Objective:    Physical Exam  Constitutional: She appears well-developed and well-nourished. No distress.  HENT:  Nose: Nose normal.  Mouth/Throat: Oropharynx is clear and moist.  Neck: Neck supple. No thyromegaly present.  Cardiovascular: Normal rate and regular rhythm.   Pulmonary/Chest: Breath sounds normal. No respiratory distress. She has no wheezes.  Abdominal: Soft. Bowel sounds are normal. There is no tenderness.  Musculoskeletal: She exhibits no edema or tenderness.  DP pulses palpable and equal bilaterally.    Lymphadenopathy:    She has no cervical adenopathy.  Skin: No rash noted. No erythema.  Psychiatric: She has a normal mood and affect. Her behavior is normal.    BP 132/72 (BP Location: Left Arm, Patient Position: Sitting, Cuff Size: Large)   Pulse 81   Temp 98.6 F (37 C) (Oral)   Resp 12   Ht _0  (1.626 m)   Wt 193 lb 9.6 oz (87.8 kg)   SpO2 97%   BMI 33.23 kg/m  Wt Readings from Last 3 Encounters:  12/17/16 193 lb 9.6 oz (87.8 kg)  12/11/16 195 lb 12.8 oz (88.8 kg)  09/25/16 199 lb 12.8 oz (90.6 kg)     Lab Results  Component Value Date   WBC 10.5 09/13/2016   HGB 13.9 09/13/2016   HCT 42.1 09/13/2016   PLT 282 09/13/2016   GLUCOSE 133 (H) 12/11/2016   CHOL 241 (H) 12/11/2016   TRIG 147.0  12/11/2016   HDL 53.20 12/11/2016   LDLDIRECT 171.2 05/26/2013   LDLCALC 158 (H) 12/11/2016   ALT 14 12/11/2016   AST 14 12/11/2016   NA 141 12/11/2016   K 4.6 12/11/2016   CL 103 12/11/2016   CREATININE 0.92 12/11/2016   BUN 20 12/11/2016   CO2 33 (H) 12/11/2016   TSH 2.81 08/06/2016   INR 0.9 04/03/2012   HGBA1C 6.7 (H) 12/11/2016   MICROALBUR <0.7 08/06/2016    Dg Chest 2 View  Result Date: 09/13/2016 CLINICAL DATA:  LEFT side chest pain radiating down LEFT arm, awoke patient from sleep this morning, nausea, diaphoresis, history diabetes mellitus, hypertension EXAM: CHEST  2 VIEW COMPARISON:  04/03/2012 FINDINGS: Normal heart size, mediastinal contours, and pulmonary vascularity. Atherosclerotic calcification aorta. Minimal linear scarring at LEFT costophrenic angle. Lungs otherwise clear. Calcified granuloma at RIGHT apex unchanged. No pleural effusion or pneumothorax. Bones demineralized. IMPRESSION: Minimal LEFT basilar scarring. Aortic atherosclerosis.  No acute abnormalities. Electronically Signed   By: Lavonia Dana M.D.   On: 09/13/2016 08:41       Assessment & Plan:   Problem List Items Addressed This Visit    Aortic atherosclerosis ([place])    Unable to take statin medications.  Intolerant.  Follow.        Diabetes mellitus ([place])    Low carb diet and exercise.  Follow met b and a1c.  Up to date with eye checks.        Relevant Orders   Hemoglobin A1c   Hepatic function panel   GERD (gastroesophageal reflux disease)    On prilosec daily.  Symptoms controlled.  Desires no further intervention.  Follow.        Hypercholesterolemia    Low cholesterol diet and exercise.  Did not tolerate statin medications.  Follow lipid panel and liver function tests.        Relevant Orders   Lipid panel   Hypertension    Blood pressure under good control.  Continue same medication regimen.  Follow pressures.  Follow metabolic panel.        Relevant Orders   Basic metabolic  panel   Obesity (BMI 30-39.9)    Diet and exercise.  Follow.         Other Visit Diagnoses    Leg pain, bilateral    -  Primary   bilateral leg aching as outlined.  appears to be most c/w venous insufficiency.  compression hose.         [name], MD

## 2016-12-19 ENCOUNTER — Encounter: Payer: Self-pay | Admitting: Internal Medicine

## 2016-12-19 DIAGNOSIS — I700 Atherosclerosis of aorta: Secondary | ICD-10-CM | POA: Insufficient documentation

## 2016-12-19 NOTE — Assessment & Plan Note (Signed)
Blood pressure under good control.  Continue same medication regimen.  Follow pressures.  Follow metabolic panel.   

## 2016-12-19 NOTE — Assessment & Plan Note (Signed)
Diet and exercise.  Follow.  

## 2016-12-19 NOTE — Assessment & Plan Note (Signed)
Low cholesterol diet and exercise.  Did not tolerate statin medications.  Follow lipid panel and liver function tests.

## 2016-12-19 NOTE — Assessment & Plan Note (Signed)
Unable to take statin medications.  Intolerant.  Follow.

## 2016-12-19 NOTE — Assessment & Plan Note (Signed)
On prilosec daily.  Symptoms controlled.  Desires no further intervention.  Follow.

## 2016-12-19 NOTE — Assessment & Plan Note (Signed)
Low carb diet and exercise.  Follow met b and a1c.  Up to date with eye checks.

## 2016-12-24 DIAGNOSIS — H353131 Nonexudative age-related macular degeneration, bilateral, early dry stage: Secondary | ICD-10-CM | POA: Diagnosis not present

## 2017-01-04 DIAGNOSIS — Z961 Presence of intraocular lens: Secondary | ICD-10-CM | POA: Diagnosis not present

## 2017-01-04 DIAGNOSIS — H26492 Other secondary cataract, left eye: Secondary | ICD-10-CM | POA: Diagnosis not present

## 2017-01-28 ENCOUNTER — Ambulatory Visit (INDEPENDENT_AMBULATORY_CARE_PROVIDER_SITE_OTHER): Payer: Medicare Other | Admitting: Family Medicine

## 2017-01-28 ENCOUNTER — Encounter: Payer: Self-pay | Admitting: Family Medicine

## 2017-01-28 DIAGNOSIS — R21 Rash and other nonspecific skin eruption: Secondary | ICD-10-CM | POA: Insufficient documentation

## 2017-01-28 MED ORDER — TRIAMCINOLONE ACETONIDE 0.1 % EX OINT: 1.0000 "application " | TOPICAL_OINTMENT | Freq: Two times a day (BID) | CUTANEOUS | 0 refills | Status: DC

## 2017-01-28 NOTE — Progress Notes (Signed)
Subjective:  Patient ID: Misty Jones, female    DOB: 07-29-1940  Age: 76 y.o. MRN: [id number]  CC: Rash  HPI:  76 year old female presents with the above complaint.  Patient reports that she's had several bumps/raised areas intermittently over the past 3-4 weeks. Started on the back initially and has affected other areas. No new exposures or changes. She has been outside mowing in the yard recently. No fever or other associated symptoms. She's used some essentials with some improvement in the itching. No known exacerbating factors. No other associated symptoms. No other complaints or concerns at this time.  Social Hx   Social History   Social History  . Marital status: Divorced    Spouse name: N/A  . Number of children: 2  . Years of education: N/A   Occupational History  . retired    Social History Main Topics  . Smoking status: Former Smoker    Packs/day: 0.50    Years: 20.00    Quit date: 07/26/1984  . Smokeless tobacco: Never Used     Comment: quit smoking years ago  . Alcohol use No  . Drug use: No  . Sexual activity: No   Other Topics Concern  . None   Social History Narrative  . None    Review of Systems  Constitutional: Negative.   Skin: Positive for rash.    Objective:  BP 118/78 (BP Location: Left Arm, Patient Position: Sitting, Cuff Size: Large)   Pulse 80   Temp 98.7 F (37.1 C) (Oral)   Resp 16   Wt 193 lb 2 oz (87.6 kg)   SpO2 98%   BMI 33.15 kg/m   BP/Weight 01/28/2017 12/17/2016 03/09/4817  Systolic BP 563 149 702  Diastolic BP 78 72 70  Wt. (Lbs) 193.13 193.6 195.8  BMI 33.15 33.23 33.61    Physical Exam  Constitutional: She is oriented to person, place, and time. She appears well-developed. No distress.  HENT:  Head: Normocephalic and atraumatic.  Eyes: Conjunctivae are normal. No scleral icterus.  Pulmonary/Chest: Effort normal. No respiratory distress.  Neurological: She is alert and oriented to person, place, and time.    Skin:  Scattered erythematous, papular areas noted. Central eschar noted. Appears to be consistent with bug bites.  Psychiatric: She has a normal mood and affect.  Vitals reviewed.   Lab Results  Component Value Date   WBC 10.5 09/13/2016   HGB 13.9 09/13/2016   HCT 42.1 09/13/2016   PLT 282 09/13/2016   GLUCOSE 133 (H) 12/11/2016   CHOL 241 (H) 12/11/2016   TRIG 147.0 12/11/2016   HDL 53.20 12/11/2016   LDLDIRECT 171.2 05/26/2013   LDLCALC 158 (H) 12/11/2016   ALT 14 12/11/2016   AST 14 12/11/2016   NA 141 12/11/2016   K 4.6 12/11/2016   CL 103 12/11/2016   CREATININE 0.92 12/11/2016   BUN 20 12/11/2016   CO2 33 (H) 12/11/2016   TSH 2.81 08/06/2016   INR 0.9 04/03/2012   HGBA1C 6.7 (H) 12/11/2016   MICROALBUR <0.7 08/06/2016    Assessment & Plan:   Problem List Items Addressed This Visit      Musculoskeletal and Integument   Rash    New problem. Patient's scattered papules appear to be secondary to bug bites. Treating with triamcinolone.         Meds ordered this encounter  Medications  . nitroGLYCERIN (NITROSTAT) 0.4 MG SL tablet    Sig: Place under the tongue.  . triamcinolone  ointment (KENALOG) 0.1 %    Sig: Apply 1 application topically 2 (two) times daily.    Dispense:  30 g    Refill:  0    Follow-up: PRN  [place]

## 2017-01-28 NOTE — Assessment & Plan Note (Signed)
New problem. Patient's scattered papules appear to be secondary to bug bites. Treating with triamcinolone.

## 2017-01-28 NOTE — Patient Instructions (Signed)
Topical as prescribed.  Follow up as needed.  Take care  Dr. Lacinda Axon

## 2017-02-26 ENCOUNTER — Other Ambulatory Visit: Payer: Self-pay | Admitting: Internal Medicine

## 2017-03-25 DIAGNOSIS — I872 Venous insufficiency (chronic) (peripheral): Secondary | ICD-10-CM | POA: Diagnosis not present

## 2017-03-25 DIAGNOSIS — E782 Mixed hyperlipidemia: Secondary | ICD-10-CM | POA: Diagnosis not present

## 2017-03-25 DIAGNOSIS — I10 Essential (primary) hypertension: Secondary | ICD-10-CM | POA: Diagnosis not present

## 2017-03-25 DIAGNOSIS — E119 Type 2 diabetes mellitus without complications: Secondary | ICD-10-CM | POA: Diagnosis not present

## 2017-03-31 IMAGING — MG MM DIGITAL SCREENING BILAT W/ TOMO W/ CAD
8 of 13 series · 8 of 29 positions shown · non-contrast
Comparison: Previous exam(s).

CLINICAL DATA: Screening.

EXAM:
2D DIGITAL SCREENING BILATERAL MAMMOGRAM WITH CAD AND ADJUNCT TOMO

[L MLO (1 of 2)]
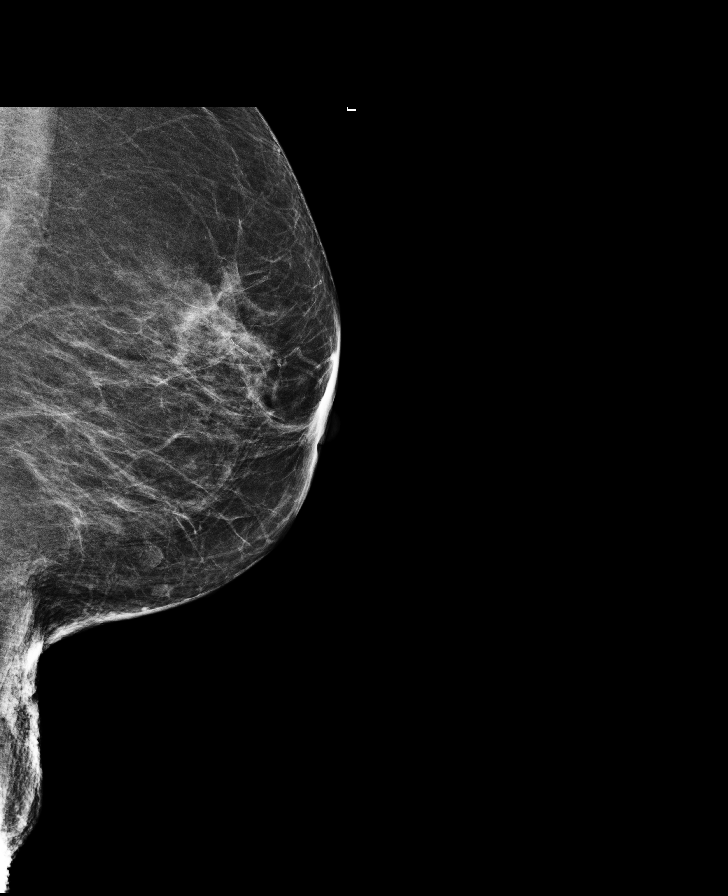

[L MLO (2 of 2)]
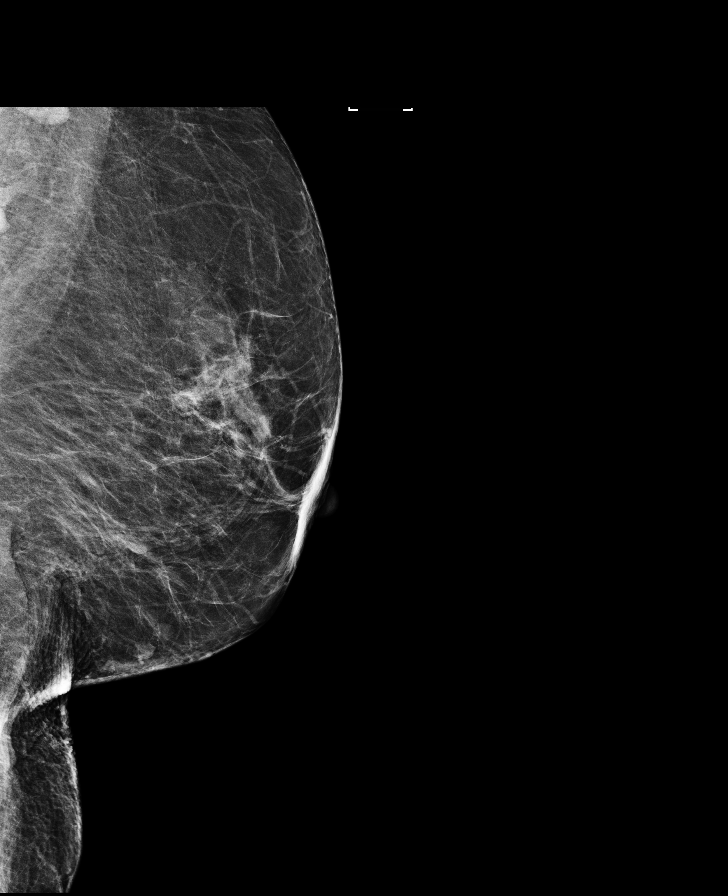

[L MLO synth-2D]
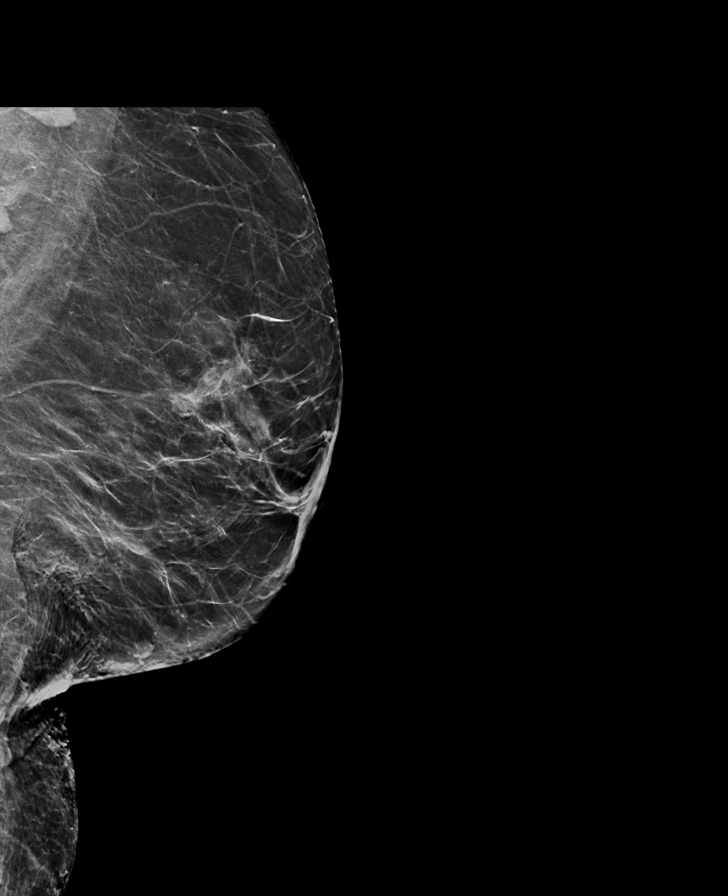

[R MLO]
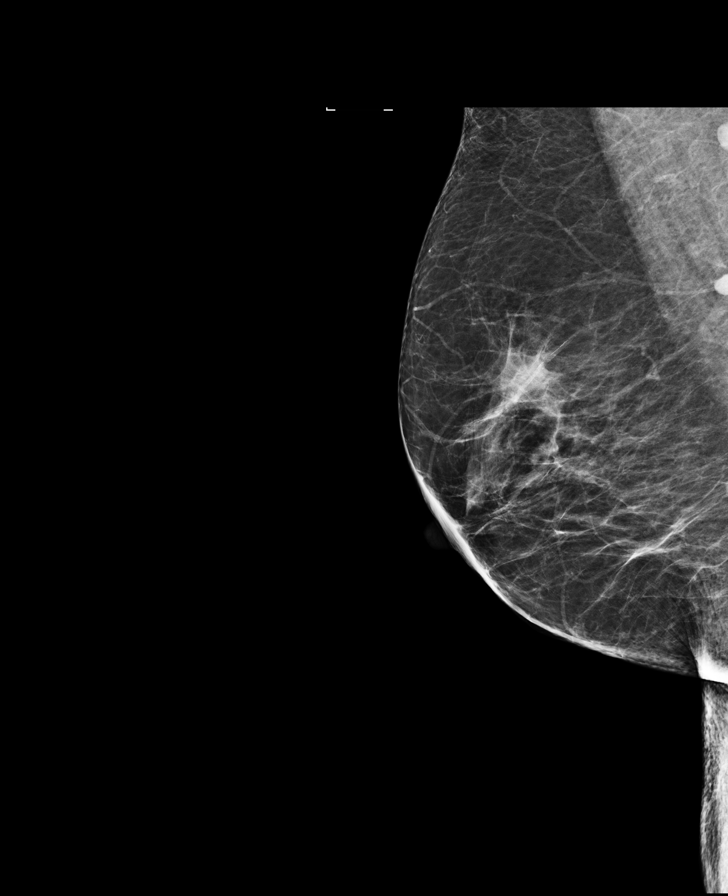

[R CC]
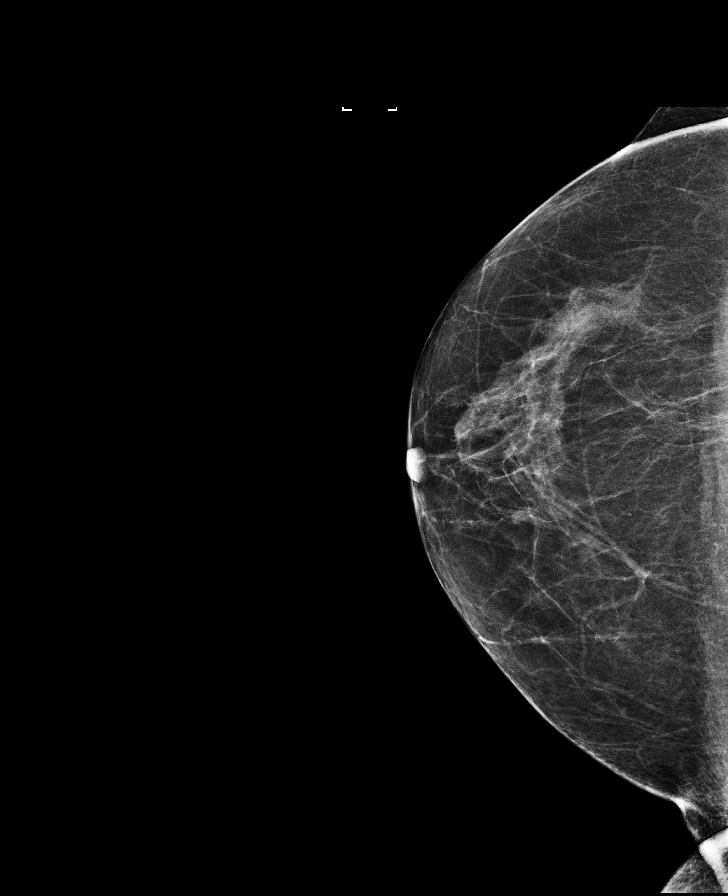

[R CC synth-2D]
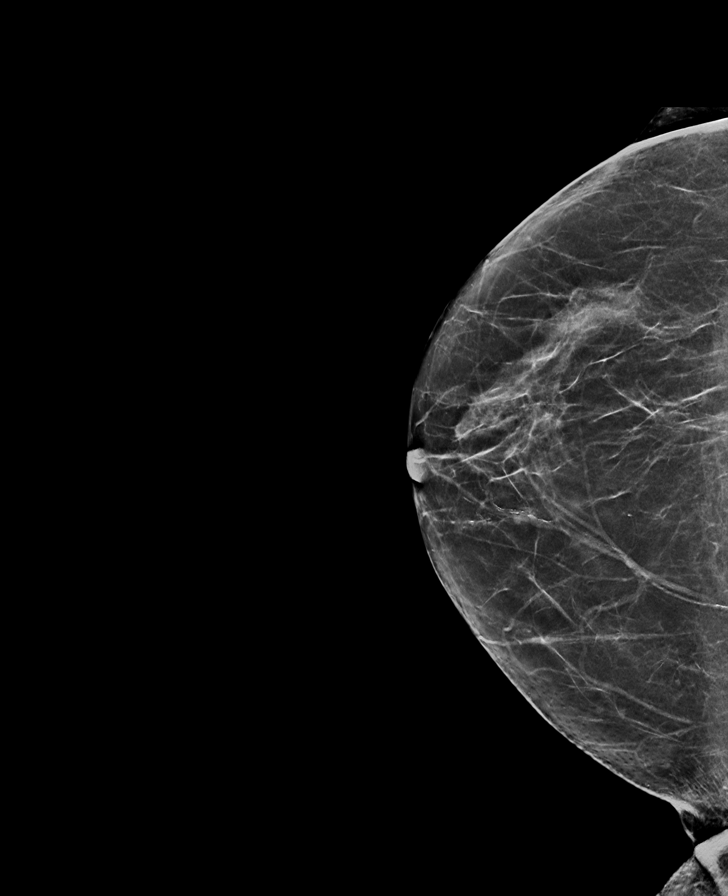

[L CC synth-2D]
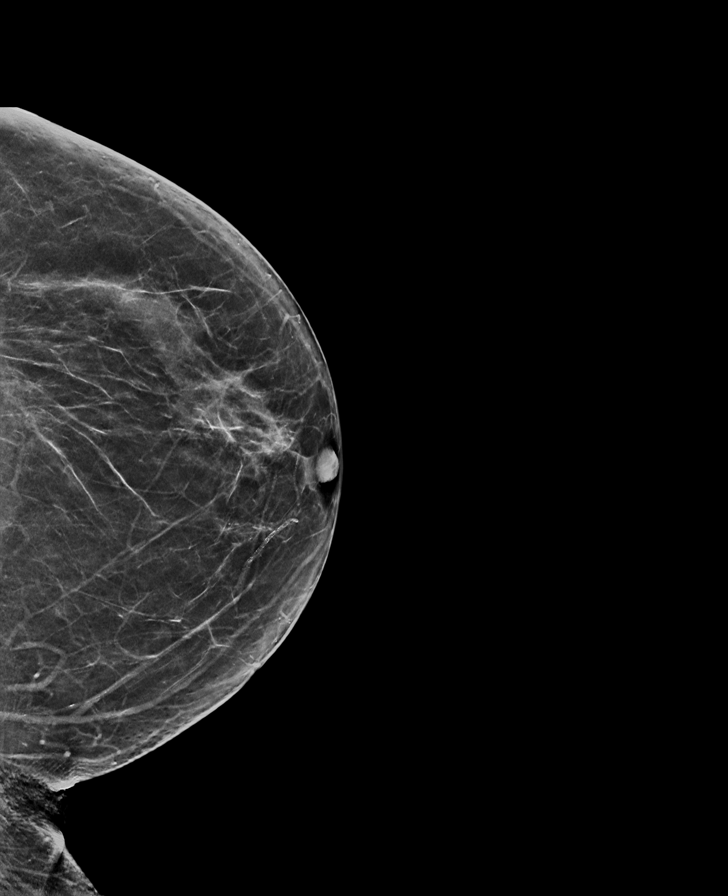

[R MLO synth-2D]
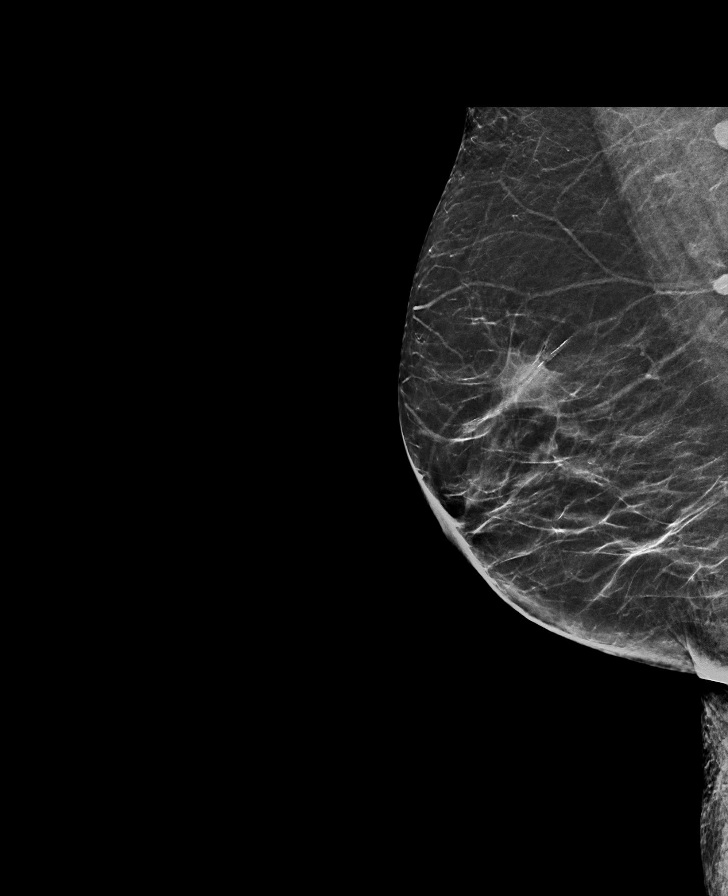

[8 of 29 positions shown; findings below may reference images not displayed]

ACR Breast Density Category b: There are scattered areas of
fibroglandular density.
FINDINGS: There are no findings suspicious for malignancy. Images were
processed with CAD.
IMPRESSION: No mammographic evidence of malignancy. A result letter of this
screening mammogram will be mailed directly to the patient.

RECOMMENDATION:
Screening mammogram in one year. (Code:97-6-RS4)

BI-RADS CATEGORY  1: Negative.

## 2017-04-13 ENCOUNTER — Other Ambulatory Visit: Payer: Self-pay | Admitting: Internal Medicine

## 2017-04-13 DIAGNOSIS — Z1231 Encounter for screening mammogram for malignant neoplasm of breast: Secondary | ICD-10-CM

## 2017-04-21 ENCOUNTER — Other Ambulatory Visit (INDEPENDENT_AMBULATORY_CARE_PROVIDER_SITE_OTHER): Payer: Medicare Other

## 2017-04-21 DIAGNOSIS — I10 Essential (primary) hypertension: Secondary | ICD-10-CM | POA: Diagnosis not present

## 2017-04-21 DIAGNOSIS — E7800 Pure hypercholesterolemia, unspecified: Secondary | ICD-10-CM

## 2017-04-21 DIAGNOSIS — E119 Type 2 diabetes mellitus without complications: Secondary | ICD-10-CM

## 2017-04-21 LAB — BASIC METABOLIC PANEL
BUN: 19 mg/dL (ref 6–23)
CO2: 32 mEq/L (ref 19–32)
CREATININE: 0.98 mg/dL (ref 0.40–1.20)
Calcium: 9.4 mg/dL (ref 8.4–10.5)
Chloride: 100 mEq/L (ref 96–112)
GFR: 58.49 mL/min — AB (ref >60.00)
Glucose, Bld: 146 mg/dL — ABNORMAL HIGH (ref 70–99)
POTASSIUM: 4.5 meq/L (ref 3.5–5.1)
Sodium: 139 mEq/L (ref 135–145)

## 2017-04-21 LAB — LIPID PANEL
CHOL/HDL RATIO: 4
Cholesterol: 245 mg/dL — ABNORMAL HIGH (ref 0–200)
HDL: 54.9 mg/dL (ref >39.00)
LDL CALC: 167 mg/dL — AB (ref 0–99)
NonHDL: 190.53
TRIGLYCERIDES: 116 mg/dL (ref 0.0–149.0)
VLDL: 23.2 mg/dL (ref 0.0–40.0)

## 2017-04-21 LAB — HEPATIC FUNCTION PANEL
ALK PHOS: 65 U/L (ref 39–117)
ALT: 14 U/L (ref 0–35)
AST: 15 U/L (ref 0–37)
Albumin: 4.1 g/dL (ref 3.5–5.2)
BILIRUBIN DIRECT: 0.1 mg/dL (ref 0.0–0.3)
BILIRUBIN TOTAL: 0.6 mg/dL (ref 0.2–1.2)
Total Protein: 6.5 g/dL (ref 6.0–8.3)

## 2017-04-21 LAB — HEMOGLOBIN A1C: Hgb A1c MFr Bld: 6.7 % — ABNORMAL HIGH (ref 4.6–6.5)

## 2017-04-23 ENCOUNTER — Encounter: Payer: Self-pay | Admitting: Internal Medicine

## 2017-04-23 ENCOUNTER — Ambulatory Visit (INDEPENDENT_AMBULATORY_CARE_PROVIDER_SITE_OTHER): Payer: Medicare Other | Admitting: Internal Medicine

## 2017-04-23 DIAGNOSIS — I700 Atherosclerosis of aorta: Secondary | ICD-10-CM

## 2017-04-23 DIAGNOSIS — E7800 Pure hypercholesterolemia, unspecified: Secondary | ICD-10-CM | POA: Diagnosis not present

## 2017-04-23 DIAGNOSIS — K219 Gastro-esophageal reflux disease without esophagitis: Secondary | ICD-10-CM | POA: Diagnosis not present

## 2017-04-23 DIAGNOSIS — Z23 Encounter for immunization: Secondary | ICD-10-CM | POA: Diagnosis not present

## 2017-04-23 DIAGNOSIS — R42 Dizziness and giddiness: Secondary | ICD-10-CM

## 2017-04-23 DIAGNOSIS — E119 Type 2 diabetes mellitus without complications: Secondary | ICD-10-CM | POA: Diagnosis not present

## 2017-04-23 DIAGNOSIS — Z6833 Body mass index (BMI) 33.0-33.9, adult: Secondary | ICD-10-CM | POA: Diagnosis not present

## 2017-04-23 DIAGNOSIS — Z0000 Encounter for general adult medical examination without abnormal findings: Secondary | ICD-10-CM

## 2017-04-23 DIAGNOSIS — I10 Essential (primary) hypertension: Secondary | ICD-10-CM

## 2017-04-23 MED ORDER — PANTOPRAZOLE SODIUM 40 MG PO TBEC: 40.0000 mg | DELAYED_RELEASE_TABLET | Freq: Two times a day (BID) | ORAL | 3 refills | Status: DC

## 2017-04-23 MED ORDER — TRIAMCINOLONE ACETONIDE 55 MCG/ACT NA AERO: INHALATION_SPRAY | NASAL | 1 refills | Status: DC

## 2017-04-23 NOTE — Progress Notes (Signed)
Patient ID: Misty Jones, female   DOB: [birth year], 76 y.o.   MRN: [id number]   Subjective:    Patient ID: Misty Jones, female    DOB: 1941/06/11, 76 y.o.   MRN: [id number]  HPI  Patient with past history of diabetes, hypertension and hypercholesterolemia.  She comes in today to follow up on these issues as well as for a complete physical exam.  She reports increased sinus congestion.  Worse early am and late at night.  Some drainage.  Is some better.  Describes some dizziness when lies down.  Describes it as room spinning.  No headache.  No chest pain.  No sob.  Increased acid reflux.  On omeprazole.  Discussed changing medications.  Also discussed referral to GI.  No abdominal pain.  Bowels moving.  Saw cardiology 03/25/17.  Stable.  Handling stress.  Overall she feels she is doing well.     Past Medical History:  Diagnosis Date  . B12 deficiency   . Chicken pox   . Diabetes mellitus ([place])   . GERD (gastroesophageal reflux disease)   . History of diverticulitis of colon   . Hypercholesterolemia   . Hypertension    Past Surgical History:  Procedure Laterality Date  . ABDOMINAL HYSTERECTOMY  1984  . BACK SURGERY  1986   ruptured disc, Dr Hardin Negus [facility])  . CHOLECYSTECTOMY  1991  . CHOLECYSTECTOMY  1992  . COLONOSCOPY WITH PROPOFOL N/A 06/29/2016   Procedure: COLONOSCOPY WITH PROPOFOL;  Surgeon: Manya Silvas, MD;  Location: [facility] ENDOSCOPY;  Service: Endoscopy;  Laterality: N/A;  . TONSILLECTOMY     Family History  Problem Relation Age of Onset  . Hypertension Mother   . Cancer Mother        Mouth cancer  . Heart disease Father        myocardial infarction  . Raynaud syndrome Sister   . Asthma Sister   . Congenital heart disease Sister   . Thyroid disease Sister   . Thyroid cancer Daughter   . Breast cancer Neg Hx   . Colon cancer Neg Hx    Social History   Social History  . Marital status: Divorced    Spouse name: N/A  . Number of children: 2  . Years of  education: N/A   Occupational History  . retired    Social History Main Topics  . Smoking status: Former Smoker    Packs/day: 0.50    Years: 20.00    Quit date: 07/26/1984  . Smokeless tobacco: Never Used     Comment: quit smoking years ago  . Alcohol use No  . Drug use: No  . Sexual activity: No   Other Topics Concern  . None   Social History Narrative  . None    Outpatient Encounter Prescriptions as of 04/23/2017  Medication Sig  . aspirin 81 MG tablet Take 81 mg by mouth daily.  Marland Kitchen BAYER CONTOUR TEST test strip USE ONE STRIP TO CHECK GLUCOSE ONCE DAILY  . benazepril (LOTENSIN) 40 MG tablet TAKE ONE TABLET BY MOUTH ONCE DAILY  . beta carotene w/minerals (OCUVITE) tablet Take 1 tablet by mouth daily.  . Calcium Carbonate-Vitamin D (CALCIUM 600+D) 600-400 MG-UNIT per tablet Take 1 tablet by mouth 2 (two) times daily.  . Cholecalciferol (VITAMIN D) 2000 UNITS tablet Take 1,000 Units by mouth daily.   . cyanocobalamin (,VITAMIN B-12,) 1000 MCG/ML injection INJECT 1 ML (1,000 MCG TOTAL) INTO THE MUSCLE EVERY 30 (THIRTY) DAYS.  Marland Kitchen  fish oil-omega-3 fatty acids 1000 MG capsule Take 1 g by mouth 2 (two) times daily.  Marland Kitchen glucose blood (BAYER CONTOUR TEST) test strip 1 each by Other route daily. Use as instructed  . hydrochlorothiazide (HYDRODIURIL) 25 MG tablet TAKE ONE-HALF TABLET BY MOUTH ONCE DAILY  . Lancets MISC Check sugars once a day Dx: E11.9 (Contour Next)  . metFORMIN (GLUCOPHAGE) 500 MG tablet TAKE ONE TABLET BY MOUTH ONCE DAILY  . niacin 100 MG tablet Take 100 mg by mouth at bedtime.  . nitroGLYCERIN (NITROSTAT) 0.4 MG SL tablet Place under the tongue.  . triamcinolone ointment (KENALOG) 0.1 % Apply 1 application topically 2 (two) times daily.  . [DISCONTINUED] omeprazole (PRILOSEC OTC) 20 MG tablet Take 1 tablet (20 mg total) by mouth 2 (two) times daily.  . pantoprazole (PROTONIX) 40 MG tablet Take 1 tablet (40 mg total) by mouth 2 (two) times daily before a meal.  .  triamcinolone (NASACORT ALLERGY 24HR) 55 MCG/ACT AERO nasal inhaler Two sprays each nostril one time per day.  Do this in the evening.   No facility-administered encounter medications on file as of 04/23/2017.     Review of Systems  Constitutional: Negative for appetite change and unexpected weight change.  HENT: Positive for congestion and postnasal drip.   Eyes: Negative for pain and visual disturbance.  Respiratory: Negative for cough, chest tightness and shortness of breath.   Cardiovascular: Negative for chest pain, palpitations and leg swelling.  Gastrointestinal: Negative for abdominal pain, diarrhea, nausea and vomiting.  Genitourinary: Negative for difficulty urinating and dysuria.  Musculoskeletal: Negative for back pain and joint swelling.  Skin: Negative for color change and rash.  Neurological: Negative for dizziness, light-headedness and headaches.  Hematological: Negative for adenopathy. Does not bruise/bleed easily.  Psychiatric/Behavioral: Negative for agitation and dysphoric mood.       Objective:    Physical Exam  Constitutional: She is oriented to person, place, and time. She appears well-developed and well-nourished. No distress.  HENT:  Nose: Nose normal.  Mouth/Throat: Oropharynx is clear and moist.  Eyes: Right eye exhibits no discharge. Left eye exhibits no discharge. No scleral icterus.  Neck: Neck supple. No thyromegaly present.  Cardiovascular: Normal rate and regular rhythm.   Pulmonary/Chest: Breath sounds normal. No accessory muscle usage. No tachypnea. No respiratory distress. She has no decreased breath sounds. She has no wheezes. She has no rhonchi. Right breast exhibits no inverted nipple, no mass, no nipple discharge and no tenderness (no axillary adenopathy). Left breast exhibits no inverted nipple, no mass, no nipple discharge and no tenderness (no axilarry adenopathy).  Abdominal: Soft. Bowel sounds are normal. There is no tenderness.    Musculoskeletal: She exhibits no edema or tenderness.  Lymphadenopathy:    She has no cervical adenopathy.  Neurological: She is alert and oriented to person, place, and time.  Skin: Skin is warm. No rash noted. No erythema.  Psychiatric: She has a normal mood and affect. Her behavior is normal.    BP 120/78 (BP Location: Left Arm, Patient Position: Sitting, Cuff Size: Normal)   Pulse 90   Temp 98.7 F (37.1 C) (Oral)   Resp 14   Ht _0  (1.626 m)   Wt 194 lb 12.8 oz (88.4 kg)   SpO2 98%   BMI 33.44 kg/m  Wt Readings from Last 3 Encounters:  04/23/17 194 lb 12.8 oz (88.4 kg)  01/28/17 193 lb 2 oz (87.6 kg)  12/17/16 193 lb 9.6 oz (87.8 kg)  Lab Results  Component Value Date   WBC 10.5 09/13/2016   HGB 13.9 09/13/2016   HCT 42.1 09/13/2016   PLT 282 09/13/2016   GLUCOSE 146 (H) 04/21/2017   CHOL 245 (H) 04/21/2017   TRIG 116.0 04/21/2017   HDL 54.90 04/21/2017   LDLDIRECT 171.2 05/26/2013   LDLCALC 167 (H) 04/21/2017   ALT 14 04/21/2017   AST 15 04/21/2017   NA 139 04/21/2017   K 4.5 04/21/2017   CL 100 04/21/2017   CREATININE 0.98 04/21/2017   BUN 19 04/21/2017   CO2 32 04/21/2017   TSH 2.81 08/06/2016   INR 0.9 04/03/2012   HGBA1C 6.7 (H) 04/21/2017   MICROALBUR <0.7 08/06/2016    Dg Chest 2 View  Result Date: 09/13/2016 CLINICAL DATA:  LEFT side chest pain radiating down LEFT arm, awoke patient from sleep this morning, nausea, diaphoresis, history diabetes mellitus, hypertension EXAM: CHEST  2 VIEW COMPARISON:  04/03/2012 FINDINGS: Normal heart size, mediastinal contours, and pulmonary vascularity. Atherosclerotic calcification aorta. Minimal linear scarring at LEFT costophrenic angle. Lungs otherwise clear. Calcified granuloma at RIGHT apex unchanged. No pleural effusion or pneumothorax. Bones demineralized. IMPRESSION: Minimal LEFT basilar scarring. Aortic atherosclerosis.  No acute abnormalities. Electronically Signed   By: Lavonia Dana M.D.   On:  09/13/2016 08:41       Assessment & Plan:   Problem List Items Addressed This Visit    Aortic atherosclerosis ([place])    Unable to take multiple statin medications.        BMI 33.0-33.9,adult    Discussed diet and exercise.  Follow.        Diabetes mellitus ([place])    Low carb diet and exercise.  Follow met b and a1c.  Sugars reviewed and attached.  AM sugars averaging 115-130s and pm sugars averaging 110-130s.        Relevant Orders   Hemoglobin A1c   Microalbumin / creatinine urine ratio   Dizziness    Treat allergy/sinus symptoms as outlined.  Steroid nasal spray and saline nasal spray as outlined.  Discussed further w/up for the dizziness.  Discussed ENT referral and scan.  She wants to hold on any further w/up.  Follow.        GERD (gastroesophageal reflux disease)    Persistent acid reflux despite omeprazole.  Change to protonix.  Refer back to GI.        Relevant Medications   pantoprazole (PROTONIX) 40 MG tablet   Other Relevant Orders   Ambulatory referral to Gastroenterology   Health care maintenance    Physical today 04/23/17.  Mammogram 04/14/16 - Birads I.  Colonoscopy 06/29/16 - tubular adenoma x 2 and hyperplastic polyp x 2.  Mammogram scheduled for 05/14/17.        Hypercholesterolemia    Intolerant to multiple statin medications.  Discussed repatha.  She does not have insurance medication coverage.  Will see if able tot get pt assistance.        Relevant Orders   Hepatic function panel   Lipid panel   Hypertension    Blood pressure under good control.  Continue same medication regimen.  Follow pressures.  Follow metabolic panel.        Relevant Orders   CBC with Differential/Platelet   TSH   Basic metabolic panel    Other Visit Diagnoses    Encounter for immunization       Relevant Medications   pantoprazole (PROTONIX) 40 MG tablet   triamcinolone (NASACORT ALLERGY 24HR) 55  MCG/ACT AERO nasal inhaler   Other Relevant Orders   Flu vaccine HIGH DOSE  PF (Completed)   Ambulatory referral to Gastroenterology       [name], MD

## 2017-04-23 NOTE — Assessment & Plan Note (Signed)
Physical today 04/23/17.  Mammogram 04/14/16 - Birads I.  Colonoscopy 06/29/16 - tubular adenoma x 2 and hyperplastic polyp x 2.  Mammogram scheduled for 05/14/17.

## 2017-04-23 NOTE — Patient Instructions (Signed)
Saline nasal spray - flush nose at least 2-3x/day  nasacort nasal spray - 2 sprays each nostril one time per day.  Do this in the evening.  

## 2017-04-25 ENCOUNTER — Encounter: Payer: Self-pay | Admitting: Internal Medicine

## 2017-04-25 DIAGNOSIS — R42 Dizziness and giddiness: Secondary | ICD-10-CM | POA: Insufficient documentation

## 2017-04-25 NOTE — Assessment & Plan Note (Signed)
Discussed diet and exercise.  Follow.  

## 2017-04-25 NOTE — Assessment & Plan Note (Signed)
Unable to take multiple statin medications.

## 2017-04-25 NOTE — Assessment & Plan Note (Signed)
Persistent acid reflux despite omeprazole.  Change to protonix.  Refer back to GI.

## 2017-04-25 NOTE — Assessment & Plan Note (Signed)
Low carb diet and exercise.  Follow met b and a1c.  Sugars reviewed and attached.  AM sugars averaging 115-130s and pm sugars averaging 110-130s.

## 2017-04-25 NOTE — Assessment & Plan Note (Signed)
Treat allergy/sinus symptoms as outlined.  Steroid nasal spray and saline nasal spray as outlined.  Discussed further w/up for the dizziness.  Discussed ENT referral and scan.  She wants to hold on any further w/up.  Follow.

## 2017-04-25 NOTE — Assessment & Plan Note (Signed)
Blood pressure under good control.  Continue same medication regimen.  Follow pressures.  Follow metabolic panel.   

## 2017-04-25 NOTE — Assessment & Plan Note (Signed)
Intolerant to multiple statin medications.  Discussed repatha.  She does not have insurance medication coverage.  Will see if able tot get pt assistance.

## 2017-04-28 LAB — HM DIABETES EYE EXAM

## 2017-05-05 ENCOUNTER — Ambulatory Visit
Admission: RE | Admit: 2017-05-05 | Discharge: 2017-05-05 | Disposition: A | Discharge status: Home / Self Care | Payer: Medicare Other | Source: Ambulatory Visit | Attending: Internal Medicine | Admitting: Internal Medicine

## 2017-05-05 DIAGNOSIS — Z1231 Encounter for screening mammogram for malignant neoplasm of breast: Secondary | ICD-10-CM | POA: Diagnosis not present

## 2017-05-25 DIAGNOSIS — E119 Type 2 diabetes mellitus without complications: Secondary | ICD-10-CM | POA: Diagnosis not present

## 2017-06-09 DIAGNOSIS — M9903 Segmental and somatic dysfunction of lumbar region: Secondary | ICD-10-CM | POA: Diagnosis not present

## 2017-06-09 DIAGNOSIS — M9905 Segmental and somatic dysfunction of pelvic region: Secondary | ICD-10-CM | POA: Diagnosis not present

## 2017-06-09 DIAGNOSIS — M955 Acquired deformity of pelvis: Secondary | ICD-10-CM | POA: Diagnosis not present

## 2017-06-09 DIAGNOSIS — M5431 Sciatica, right side: Secondary | ICD-10-CM | POA: Diagnosis not present

## 2017-06-10 DIAGNOSIS — M5431 Sciatica, right side: Secondary | ICD-10-CM | POA: Diagnosis not present

## 2017-06-10 DIAGNOSIS — M955 Acquired deformity of pelvis: Secondary | ICD-10-CM | POA: Diagnosis not present

## 2017-06-10 DIAGNOSIS — M9903 Segmental and somatic dysfunction of lumbar region: Secondary | ICD-10-CM | POA: Diagnosis not present

## 2017-06-10 DIAGNOSIS — M9905 Segmental and somatic dysfunction of pelvic region: Secondary | ICD-10-CM | POA: Diagnosis not present

## 2017-06-14 DIAGNOSIS — M955 Acquired deformity of pelvis: Secondary | ICD-10-CM | POA: Diagnosis not present

## 2017-06-14 DIAGNOSIS — M9905 Segmental and somatic dysfunction of pelvic region: Secondary | ICD-10-CM | POA: Diagnosis not present

## 2017-06-14 DIAGNOSIS — M5431 Sciatica, right side: Secondary | ICD-10-CM | POA: Diagnosis not present

## 2017-06-14 DIAGNOSIS — M9903 Segmental and somatic dysfunction of lumbar region: Secondary | ICD-10-CM | POA: Diagnosis not present

## 2017-06-21 ENCOUNTER — Other Ambulatory Visit: Payer: Self-pay

## 2017-06-21 ENCOUNTER — Other Ambulatory Visit: Payer: Self-pay | Admitting: Internal Medicine

## 2017-06-21 DIAGNOSIS — M5431 Sciatica, right side: Secondary | ICD-10-CM | POA: Diagnosis not present

## 2017-06-21 DIAGNOSIS — M9903 Segmental and somatic dysfunction of lumbar region: Secondary | ICD-10-CM | POA: Diagnosis not present

## 2017-06-21 DIAGNOSIS — M955 Acquired deformity of pelvis: Secondary | ICD-10-CM | POA: Diagnosis not present

## 2017-06-21 DIAGNOSIS — M9905 Segmental and somatic dysfunction of pelvic region: Secondary | ICD-10-CM | POA: Diagnosis not present

## 2017-06-21 MED ORDER — GLUCOSE BLOOD VI STRP: ORAL_STRIP | 5 refills | Status: DC

## 2017-06-24 ENCOUNTER — Telehealth: Payer: Self-pay

## 2017-06-24 ENCOUNTER — Other Ambulatory Visit: Payer: Self-pay

## 2017-06-24 DIAGNOSIS — M955 Acquired deformity of pelvis: Secondary | ICD-10-CM | POA: Diagnosis not present

## 2017-06-24 DIAGNOSIS — M9903 Segmental and somatic dysfunction of lumbar region: Secondary | ICD-10-CM | POA: Diagnosis not present

## 2017-06-24 DIAGNOSIS — M5431 Sciatica, right side: Secondary | ICD-10-CM | POA: Diagnosis not present

## 2017-06-24 DIAGNOSIS — M9905 Segmental and somatic dysfunction of pelvic region: Secondary | ICD-10-CM | POA: Diagnosis not present

## 2017-06-24 MED ORDER — GLUCOSE BLOOD VI STRP: 1.0000 | ORAL_STRIP | Freq: Every day | 5 refills | Status: DC

## 2017-06-28 DIAGNOSIS — M9903 Segmental and somatic dysfunction of lumbar region: Secondary | ICD-10-CM | POA: Diagnosis not present

## 2017-06-28 DIAGNOSIS — M5431 Sciatica, right side: Secondary | ICD-10-CM | POA: Diagnosis not present

## 2017-06-28 DIAGNOSIS — M9905 Segmental and somatic dysfunction of pelvic region: Secondary | ICD-10-CM | POA: Diagnosis not present

## 2017-06-28 DIAGNOSIS — M955 Acquired deformity of pelvis: Secondary | ICD-10-CM | POA: Diagnosis not present

## 2017-07-01 DIAGNOSIS — M9905 Segmental and somatic dysfunction of pelvic region: Secondary | ICD-10-CM | POA: Diagnosis not present

## 2017-07-01 DIAGNOSIS — M9903 Segmental and somatic dysfunction of lumbar region: Secondary | ICD-10-CM | POA: Diagnosis not present

## 2017-07-01 DIAGNOSIS — M5431 Sciatica, right side: Secondary | ICD-10-CM | POA: Diagnosis not present

## 2017-07-01 DIAGNOSIS — M955 Acquired deformity of pelvis: Secondary | ICD-10-CM | POA: Diagnosis not present

## 2017-08-04 ENCOUNTER — Other Ambulatory Visit: Payer: Self-pay | Admitting: Gastroenterology

## 2017-08-04 DIAGNOSIS — K219 Gastro-esophageal reflux disease without esophagitis: Secondary | ICD-10-CM

## 2017-08-06 ENCOUNTER — Ambulatory Visit: Payer: BLUE CROSS/BLUE SHIELD

## 2017-08-20 DIAGNOSIS — H8111 Benign paroxysmal vertigo, right ear: Secondary | ICD-10-CM | POA: Diagnosis not present

## 2017-08-20 DIAGNOSIS — E041 Nontoxic single thyroid nodule: Secondary | ICD-10-CM | POA: Diagnosis not present

## 2017-08-23 ENCOUNTER — Other Ambulatory Visit (INDEPENDENT_AMBULATORY_CARE_PROVIDER_SITE_OTHER): Payer: Medicare Other

## 2017-08-23 DIAGNOSIS — I10 Essential (primary) hypertension: Secondary | ICD-10-CM | POA: Diagnosis not present

## 2017-08-23 DIAGNOSIS — E119 Type 2 diabetes mellitus without complications: Secondary | ICD-10-CM | POA: Diagnosis not present

## 2017-08-23 DIAGNOSIS — E7800 Pure hypercholesterolemia, unspecified: Secondary | ICD-10-CM | POA: Diagnosis not present

## 2017-08-23 LAB — CBC WITH DIFFERENTIAL/PLATELET
BASOS ABS: 0 10*3/uL (ref 0.0–0.1)
Basophils Relative: 0.6 % (ref 0.0–3.0)
Eosinophils Absolute: 0.2 10*3/uL (ref 0.0–0.7)
Eosinophils Relative: 2 % (ref 0.0–5.0)
HCT: 40.8 % (ref 36.0–46.0)
Hemoglobin: 13.5 g/dL (ref 12.0–15.0)
LYMPHS ABS: 2.8 10*3/uL (ref 0.7–4.0)
Lymphocytes Relative: 37 % (ref 12.0–46.0)
MCHC: 33.1 g/dL (ref 30.0–36.0)
MCV: 92.6 fl (ref 78.0–100.0)
MONO ABS: 0.5 10*3/uL (ref 0.1–1.0)
Monocytes Relative: 6.8 % (ref 3.0–12.0)
NEUTROS ABS: 4.1 10*3/uL (ref 1.4–7.7)
NEUTROS PCT: 53.6 % (ref 43.0–77.0)
PLATELETS: 306 10*3/uL (ref 150.0–400.0)
RBC: 4.4 Mil/uL (ref 3.87–5.11)
RDW: 14 % (ref 11.5–15.5)
WBC: 7.7 10*3/uL (ref 4.0–10.5)

## 2017-08-23 LAB — LIPID PANEL
CHOL/HDL RATIO: 4
CHOLESTEROL: 248 mg/dL — AB (ref 0–200)
HDL: 55.5 mg/dL (ref >39.00)
LDL CALC: 162 mg/dL — AB (ref 0–99)
NonHDL: 192.08
TRIGLYCERIDES: 152 mg/dL — AB (ref 0.0–149.0)
VLDL: 30.4 mg/dL (ref 0.0–40.0)

## 2017-08-23 LAB — BASIC METABOLIC PANEL
BUN: 24 mg/dL — ABNORMAL HIGH (ref 6–23)
CALCIUM: 9.4 mg/dL (ref 8.4–10.5)
CHLORIDE: 101 meq/L (ref 96–112)
CO2: 31 meq/L (ref 19–32)
CREATININE: 0.89 mg/dL (ref 0.40–1.20)
GFR: 65.31 mL/min (ref >60.00)
GLUCOSE: 142 mg/dL — AB (ref 70–99)
Potassium: 4.2 mEq/L (ref 3.5–5.1)
Sodium: 140 mEq/L (ref 135–145)

## 2017-08-23 LAB — HEMOGLOBIN A1C: HEMOGLOBIN A1C: 6.8 % — AB (ref 4.6–6.5)

## 2017-08-23 LAB — TSH: TSH: 4.38 u[IU]/mL (ref 0.35–4.50)

## 2017-08-23 LAB — HEPATIC FUNCTION PANEL
ALBUMIN: 4 g/dL (ref 3.5–5.2)
ALK PHOS: 53 U/L (ref 39–117)
ALT: 18 U/L (ref 0–35)
AST: 16 U/L (ref 0–37)
Bilirubin, Direct: 0.1 mg/dL (ref 0.0–0.3)
Total Bilirubin: 0.5 mg/dL (ref 0.2–1.2)
Total Protein: 6.4 g/dL (ref 6.0–8.3)

## 2017-08-26 ENCOUNTER — Encounter: Payer: Self-pay | Admitting: Internal Medicine

## 2017-08-26 ENCOUNTER — Other Ambulatory Visit (HOSPITAL_COMMUNITY)
Admission: RE | Admit: 2017-08-26 | Discharge: 2017-08-26 | Disposition: A | Discharge status: Home / Self Care | Payer: Medicare Other | Source: Ambulatory Visit | Attending: Internal Medicine | Admitting: Internal Medicine

## 2017-08-26 ENCOUNTER — Other Ambulatory Visit: Payer: Self-pay | Admitting: Internal Medicine

## 2017-08-26 ENCOUNTER — Other Ambulatory Visit (INDEPENDENT_AMBULATORY_CARE_PROVIDER_SITE_OTHER): Payer: Medicare Other

## 2017-08-26 ENCOUNTER — Ambulatory Visit (INDEPENDENT_AMBULATORY_CARE_PROVIDER_SITE_OTHER): Payer: Medicare Other | Admitting: Internal Medicine

## 2017-08-26 VITALS — BP 118/82 | HR 96 | Temp 99.3°F | Resp 18 | Wt 204.0 lb

## 2017-08-26 DIAGNOSIS — I10 Essential (primary) hypertension: Secondary | ICD-10-CM

## 2017-08-26 DIAGNOSIS — E119 Type 2 diabetes mellitus without complications: Secondary | ICD-10-CM | POA: Diagnosis not present

## 2017-08-26 DIAGNOSIS — K219 Gastro-esophageal reflux disease without esophagitis: Secondary | ICD-10-CM | POA: Diagnosis not present

## 2017-08-26 DIAGNOSIS — R42 Dizziness and giddiness: Secondary | ICD-10-CM

## 2017-08-26 DIAGNOSIS — R300 Dysuria: Secondary | ICD-10-CM

## 2017-08-26 DIAGNOSIS — N760 Acute vaginitis: Secondary | ICD-10-CM

## 2017-08-26 DIAGNOSIS — Z6835 Body mass index (BMI) 35.0-35.9, adult: Secondary | ICD-10-CM | POA: Diagnosis not present

## 2017-08-26 DIAGNOSIS — E7800 Pure hypercholesterolemia, unspecified: Secondary | ICD-10-CM | POA: Diagnosis not present

## 2017-08-26 DIAGNOSIS — A6000 Herpesviral infection of urogenital system, unspecified: Secondary | ICD-10-CM

## 2017-08-26 DIAGNOSIS — I700 Atherosclerosis of aorta: Secondary | ICD-10-CM

## 2017-08-26 DIAGNOSIS — N898 Other specified noninflammatory disorders of vagina: Secondary | ICD-10-CM | POA: Diagnosis not present

## 2017-08-26 LAB — URINALYSIS, ROUTINE W REFLEX MICROSCOPIC
BILIRUBIN URINE: NEGATIVE
Hgb urine dipstick: NEGATIVE
KETONES UR: NEGATIVE
Nitrite: NEGATIVE
RBC / HPF: NONE SEEN (ref 0–?)
Specific Gravity, Urine: 1.02 (ref 1.000–1.030)
Total Protein, Urine: NEGATIVE
UROBILINOGEN UA: 0.2 (ref 0.0–1.0)
Urine Glucose: NEGATIVE
pH: 7 (ref 5.0–8.0)

## 2017-08-26 LAB — MICROALBUMIN / CREATININE URINE RATIO
Creatinine,U: 123.8 mg/dL
Microalb Creat Ratio: 0.6 mg/g (ref 0.0–30.0)
Microalb, Ur: 0.7 mg/dL (ref 0.0–1.9)

## 2017-08-26 LAB — HM DIABETES FOOT EXAM

## 2017-08-26 MED ORDER — PRAVASTATIN SODIUM 10 MG PO TABS: 10.0000 mg | ORAL_TABLET | Freq: Every day | ORAL | 2 refills | Status: DC

## 2017-08-26 NOTE — Progress Notes (Signed)
Patient ID: Misty Jones, female   DOB: 06/02/1941, 77 y.o.   MRN: [id number]   Subjective:    Patient ID: Misty Jones, female    DOB: 21-Dec-1940, 77 y.o.   MRN: [id number]  HPI  Patient here for a scheduled follow up.  Also having some acute issues with dysuria and increase frequency.  Reports having some vaginal itching and burning.  Some increased pressure and increased urinary frequency.  Some nocturia.  No vaginal discharge.  No bleeding.  Used monistat previously.  No other medications.  Has improved some.  Also reports recurrence of herpes outbreak in 211/2018.  States otherwise doing relatively well.  No chest pain.  No sob.  No acid reflux.  No abdominal pain.  Bowels moving.  Handling stress.  Previous dizziness resolved.  Epley maneuvers worked.  Saw ENT.    Past Medical History:  Diagnosis Date  . B12 deficiency   . Chicken pox   . Diabetes mellitus ([place])   . GERD (gastroesophageal reflux disease)   . History of diverticulitis of colon   . Hypercholesterolemia   . Hypertension    Past Surgical History:  Procedure Laterality Date  . ABDOMINAL HYSTERECTOMY  1984  . BACK SURGERY  1986   ruptured disc, Dr Hardin Negus [facility])  . CHOLECYSTECTOMY  1991  . CHOLECYSTECTOMY  1992  . COLONOSCOPY WITH PROPOFOL N/A 06/29/2016   Procedure: COLONOSCOPY WITH PROPOFOL;  Surgeon: Manya Silvas, MD;  Location: [facility] ENDOSCOPY;  Service: Endoscopy;  Laterality: N/A;  . TONSILLECTOMY     Family History  Problem Relation Age of Onset  . Hypertension Mother   . Cancer Mother        Mouth cancer  . Heart disease Father        myocardial infarction  . Raynaud syndrome Sister   . Asthma Sister   . Congenital heart disease Sister   . Thyroid disease Sister   . Thyroid cancer Daughter   . Breast cancer Neg Hx   . Colon cancer Neg Hx    Social History   Socioeconomic History  . Marital status: Divorced    Spouse name: None  . Number of children: 2  . Years of education: None    . Highest education level: None  Social Needs  . Financial resource strain: None  . Food insecurity - worry: None  . Food insecurity - inability: None  . Transportation needs - medical: None  . Transportation needs - non-medical: None  Occupational History  . Occupation: retired  Tobacco Use  . Smoking status: Former Smoker    Packs/day: 0.50    Years: 20.00    Pack years: 10.00    Last attempt to quit: 07/26/1984    Years since quitting: 33.1  . Smokeless tobacco: Never Used  . Tobacco comment: quit smoking years ago  Substance and Sexual Activity  . Alcohol use: No    Alcohol/week: 0.0 oz  . Drug use: No  . Sexual activity: No  Other Topics Concern  . None  Social History Narrative  . None    Outpatient Encounter Medications as of 08/26/2017  Medication Sig  . aspirin 81 MG tablet Take 81 mg by mouth daily.  Marland Kitchen BAYER CONTOUR TEST test strip USE ONE STRIP TO CHECK GLUCOSE ONCE DAILY  . benazepril (LOTENSIN) 40 MG tablet TAKE ONE TABLET BY MOUTH ONCE DAILY  . beta carotene w/minerals (OCUVITE) tablet Take 1 tablet by mouth daily.  . Calcium Carbonate-Vitamin D (  CALCIUM 600+D) 600-400 MG-UNIT per tablet Take 1 tablet by mouth 2 (two) times daily.  . Cholecalciferol (VITAMIN D) 2000 UNITS tablet Take 1,000 Units by mouth daily.   . cyanocobalamin (,VITAMIN B-12,) 1000 MCG/ML injection INJECT 1 ML (1,000 MCG TOTAL) INTO THE MUSCLE EVERY 30 (THIRTY) DAYS.  . fish oil-omega-3 fatty acids 1000 MG capsule Take 1 g by mouth 2 (two) times daily.  Marland Kitchen glucose blood (BAYER CONTOUR TEST) test strip 1 each by Other route daily. Use as instructed  . hydrochlorothiazide (HYDRODIURIL) 25 MG tablet TAKE ONE-HALF TABLET BY MOUTH ONCE DAILY  . Lancets MISC Check sugars once a day Dx: E11.9 (Contour Next)  . niacin 100 MG tablet Take 100 mg by mouth at bedtime.  . nitroGLYCERIN (NITROSTAT) 0.4 MG SL tablet Place under the tongue.  . pantoprazole (PROTONIX) 40 MG tablet Take 1 tablet (40 mg  total) by mouth 2 (two) times daily before a meal.  . pravastatin (PRAVACHOL) 10 MG tablet Take 1 tablet (10 mg total) by mouth daily.  Marland Kitchen triamcinolone (NASACORT ALLERGY 24HR) 55 MCG/ACT AERO nasal inhaler Two sprays each nostril one time per day.  Do this in the evening.  . triamcinolone ointment (KENALOG) 0.1 % Apply 1 application topically 2 (two) times daily.  . [DISCONTINUED] metFORMIN (GLUCOPHAGE) 500 MG tablet TAKE ONE TABLET BY MOUTH ONCE DAILY   No facility-administered encounter medications on file as of 08/26/2017.     Review of Systems  Constitutional: Negative for appetite change and unexpected weight change.  HENT: Negative for congestion and sinus pressure.   Respiratory: Negative for cough, chest tightness and shortness of breath.   Cardiovascular: Negative for chest pain, palpitations and leg swelling.  Gastrointestinal: Negative for abdominal pain, diarrhea, nausea and vomiting.  Genitourinary: Positive for dysuria and frequency. Negative for difficulty urinating and vaginal discharge.       Vaginal burning.    Musculoskeletal: Negative for back pain and myalgias.  Skin: Negative for color change and rash.  Neurological: Negative for dizziness, light-headedness and headaches.  Psychiatric/Behavioral: Negative for agitation and dysphoric mood.       Objective:    Physical Exam  Constitutional: She appears well-developed and well-nourished. No distress.  HENT:  Nose: Nose normal.  Mouth/Throat: Oropharynx is clear and moist.  Neck: Neck supple. No thyromegaly present.  Cardiovascular: Normal rate and regular rhythm.  Pulmonary/Chest: Breath sounds normal. No respiratory distress. She has no wheezes.  Abdominal: Soft. Bowel sounds are normal. There is no tenderness.  Genitourinary:  Genitourinary Comments: Normal external genitalia.  Some minimal erythema.  Vaginal vault without lesions.  Wet prep sent.  Could not appreciate any adnexal masses or tenderness.      Musculoskeletal: She exhibits no edema or tenderness.  Feet:  No lesions.  Sensation intact to pin prick and light touch.  DP pulses palpable and equal bilaterally.    Lymphadenopathy:    She has no cervical adenopathy.  Skin: No rash noted. No erythema.  Psychiatric: She has a normal mood and affect. Her behavior is normal.    BP 118/82 (BP Location: Left Arm, Patient Position: Sitting, Cuff Size: Large)   Pulse 96   Temp 99.3 F (37.4 C) (Oral)   Resp 18   Wt 204 lb (92.5 kg)   SpO2 96%   BMI 35.02 kg/m  Wt Readings from Last 3 Encounters:  08/26/17 204 lb (92.5 kg)  04/23/17 194 lb 12.8 oz (88.4 kg)  01/28/17 193 lb 2 oz (87.6 kg)  Lab Results  Component Value Date   WBC 7.7 08/23/2017   HGB 13.5 08/23/2017   HCT 40.8 08/23/2017   PLT 306.0 08/23/2017   GLUCOSE 142 (H) 08/23/2017   CHOL 248 (H) 08/23/2017   TRIG 152.0 (H) 08/23/2017   HDL 55.50 08/23/2017   LDLDIRECT 171.2 05/26/2013   LDLCALC 162 (H) 08/23/2017   ALT 18 08/23/2017   AST 16 08/23/2017   NA 140 08/23/2017   K 4.2 08/23/2017   CL 101 08/23/2017   CREATININE 0.89 08/23/2017   BUN 24 (H) 08/23/2017   CO2 31 08/23/2017   TSH 4.38 08/23/2017   INR 0.9 04/03/2012   HGBA1C 6.8 (H) 08/23/2017   MICROALBUR 0.7 08/26/2017    Mm Screening Breast Tomo Bilateral  Result Date: 05/05/2017 CLINICAL DATA:  Screening. EXAM: 2D DIGITAL SCREENING BILATERAL MAMMOGRAM WITH CAD AND ADJUNCT TOMO COMPARISON:  Previous exam(s). ACR Breast Density Category b: There are scattered areas of fibroglandular density. FINDINGS: There are no findings suspicious for malignancy. Images were processed with CAD. IMPRESSION: No mammographic evidence of malignancy. A result letter of this screening mammogram will be mailed directly to the patient. RECOMMENDATION: Screening mammogram in one year. (Code:SM-B-01Y) BI-RADS CATEGORY  1: Negative. Electronically Signed   By: Lillia Mountain M.D.   On: 05/05/2017 09:43       Assessment &  Plan:   Problem List Items Addressed This Visit    Aortic atherosclerosis ([place])    Unable to take statin medication previously.  Has tried multiple statins.  Discussed with her today.  She wants to try low dose pravastatin.       Relevant Medications   pravastatin (PRAVACHOL) 10 MG tablet   BMI 35.0-35.9,adult    Discussed diet and exercise.  Follow.        Diabetes mellitus ([place])    Low carb diet and exercise.  Follow met b and a1c.   Lab Results  Component Value Date   HGBA1C 6.8 (H) 08/23/2017        Relevant Medications   pravastatin (PRAVACHOL) 10 MG tablet   Dizziness    Saw ENT.  Resolved with Epley maneuvers.  Saw ENT.       GERD (gastroesophageal reflux disease)    Continue PPI.  Continue f/u with GI.       Herpes genitalis    Has had flares - buttock.  No flare now.  Follow.  If persistent flares, discussed the need for low dose daily dosing to prevent flares.        Hypercholesterolemia    Has had intolerance with multiple statin medications.  Discussed with her today.  She wants to retry a low dose of pravastatin.  Start 62m 2x/week and increase as tolerated.        Relevant Medications   pravastatin (PRAVACHOL) 10 MG tablet   Other Relevant Orders   Hepatic function panel   Hypertension    Blood pressure under good control.  Continue same medication regimen.  Follow pressures.  Follow metabolic panel.        Relevant Medications   pravastatin (PRAVACHOL) 10 MG tablet    Other Visit Diagnoses    Dysuria    -  Primary   Relevant Orders   Urinalysis, Routine w reflex microscopic (Completed)   Urine Culture (Completed)   Acute vaginitis       Symptoms as outlined.  Exam as outlined.  KOH/wet prep sent.  check urine to confirm no infection.  Relevant Orders   Cervicovaginal ancillary only (Completed)       [name], MD

## 2017-08-27 DIAGNOSIS — H8110 Benign paroxysmal vertigo, unspecified ear: Secondary | ICD-10-CM | POA: Diagnosis not present

## 2017-08-27 LAB — URINE CULTURE
MICRO NUMBER:: 90133451
SPECIMEN QUALITY: ADEQUATE

## 2017-08-27 LAB — CERVICOVAGINAL ANCILLARY ONLY: Wet Prep (BD Affirm): NEGATIVE

## 2017-08-29 ENCOUNTER — Encounter: Payer: Self-pay | Admitting: Internal Medicine

## 2017-08-29 DIAGNOSIS — A6000 Herpesviral infection of urogenital system, unspecified: Secondary | ICD-10-CM | POA: Insufficient documentation

## 2017-08-29 NOTE — Assessment & Plan Note (Signed)
Blood pressure under good control.  Continue same medication regimen.  Follow pressures.  Follow metabolic panel.   

## 2017-08-29 NOTE — Assessment & Plan Note (Signed)
Saw ENT.  Resolved with Epley maneuvers.  Saw ENT.

## 2017-08-29 NOTE — Assessment & Plan Note (Signed)
Continue PPI.  Continue f/u with GI.

## 2017-08-29 NOTE — Assessment & Plan Note (Signed)
Low carb diet and exercise.  Follow met b and a1c.   Lab Results  Component Value Date   HGBA1C 6.8 (H) 08/23/2017

## 2017-08-29 NOTE — Assessment & Plan Note (Signed)
Has had flares - buttock.  No flare now.  Follow.  If persistent flares, discussed the need for low dose daily dosing to prevent flares.

## 2017-08-29 NOTE — Assessment & Plan Note (Signed)
Has had intolerance with multiple statin medications.  Discussed with her today.  She wants to retry a low dose of pravastatin.  Start 10mg  2x/week and increase as tolerated.

## 2017-08-29 NOTE — Assessment & Plan Note (Signed)
Unable to take statin medication previously.  Has tried multiple statins.  Discussed with her today.  She wants to try low dose pravastatin.

## 2017-08-29 NOTE — Assessment & Plan Note (Signed)
Discussed diet and exercise.  Follow.  

## 2017-08-30 ENCOUNTER — Other Ambulatory Visit: Payer: Self-pay | Admitting: Internal Medicine

## 2017-08-30 MED ORDER — ACYCLOVIR 400 MG PO TABS
400.0000 mg | ORAL_TABLET | Freq: Two times a day (BID) | ORAL | 1 refills | Status: DC
Start: 2017-08-30 — End: 2017-12-30

## 2017-08-30 NOTE — Progress Notes (Signed)
rx sent in for acyclovir

## 2017-09-23 DIAGNOSIS — I872 Venous insufficiency (chronic) (peripheral): Secondary | ICD-10-CM | POA: Diagnosis not present

## 2017-09-23 DIAGNOSIS — E119 Type 2 diabetes mellitus without complications: Secondary | ICD-10-CM | POA: Diagnosis not present

## 2017-09-23 DIAGNOSIS — E782 Mixed hyperlipidemia: Secondary | ICD-10-CM | POA: Diagnosis not present

## 2017-09-23 DIAGNOSIS — I10 Essential (primary) hypertension: Secondary | ICD-10-CM | POA: Diagnosis not present

## 2017-10-01 ENCOUNTER — Other Ambulatory Visit: Payer: Self-pay | Admitting: Internal Medicine

## 2017-10-07 ENCOUNTER — Other Ambulatory Visit (INDEPENDENT_AMBULATORY_CARE_PROVIDER_SITE_OTHER): Payer: Medicare Other

## 2017-10-07 DIAGNOSIS — E7800 Pure hypercholesterolemia, unspecified: Secondary | ICD-10-CM

## 2017-10-07 LAB — HEPATIC FUNCTION PANEL
ALBUMIN: 3.9 g/dL (ref 3.5–5.2)
ALT: 36 U/L — AB (ref 0–35)
AST: 17 U/L (ref 0–37)
Alkaline Phosphatase: 68 U/L (ref 39–117)
BILIRUBIN TOTAL: 0.4 mg/dL (ref 0.2–1.2)
Bilirubin, Direct: 0 mg/dL (ref 0.0–0.3)
TOTAL PROTEIN: 6.3 g/dL (ref 6.0–8.3)

## 2017-10-08 ENCOUNTER — Other Ambulatory Visit: Payer: Self-pay | Admitting: Internal Medicine

## 2017-10-08 DIAGNOSIS — R945 Abnormal results of liver function studies: Secondary | ICD-10-CM

## 2017-10-08 DIAGNOSIS — R7989 Other specified abnormal findings of blood chemistry: Secondary | ICD-10-CM

## 2017-10-08 NOTE — Progress Notes (Signed)
Order placed for f/u liver panel.  

## 2017-10-28 ENCOUNTER — Other Ambulatory Visit (INDEPENDENT_AMBULATORY_CARE_PROVIDER_SITE_OTHER): Payer: Medicare Other

## 2017-10-28 DIAGNOSIS — R945 Abnormal results of liver function studies: Secondary | ICD-10-CM

## 2017-10-28 DIAGNOSIS — R7989 Other specified abnormal findings of blood chemistry: Secondary | ICD-10-CM

## 2017-10-28 LAB — HEPATIC FUNCTION PANEL
ALT: 28 U/L (ref 0–35)
AST: 22 U/L (ref 0–37)
Albumin: 3.9 g/dL (ref 3.5–5.2)
Alkaline Phosphatase: 62 U/L (ref 39–117)
Bilirubin, Direct: 0.1 mg/dL (ref 0.0–0.3)
TOTAL PROTEIN: 6.4 g/dL (ref 6.0–8.3)
Total Bilirubin: 0.5 mg/dL (ref 0.2–1.2)

## 2017-12-13 ENCOUNTER — Encounter
Admission: RE | Disposition: A | Discharge status: Home / Self Care | Payer: Self-pay | Source: Ambulatory Visit | Attending: Unknown Physician Specialty

## 2017-12-13 ENCOUNTER — Ambulatory Visit
Admission: RE | Admit: 2017-12-13 | Discharge: 2017-12-13 | Disposition: A | Discharge status: Home / Self Care | Payer: Medicare Other | Source: Ambulatory Visit | Attending: Unknown Physician Specialty | Admitting: Unknown Physician Specialty

## 2017-12-13 ENCOUNTER — Ambulatory Visit: Payer: Medicare Other | Admitting: Certified Registered Nurse Anesthetist

## 2017-12-13 ENCOUNTER — Encounter: Payer: Self-pay | Admitting: *Deleted

## 2017-12-13 DIAGNOSIS — Z9049 Acquired absence of other specified parts of digestive tract: Secondary | ICD-10-CM | POA: Diagnosis not present

## 2017-12-13 DIAGNOSIS — Z7982 Long term (current) use of aspirin: Secondary | ICD-10-CM | POA: Diagnosis not present

## 2017-12-13 DIAGNOSIS — Z87891 Personal history of nicotine dependence: Secondary | ICD-10-CM | POA: Diagnosis not present

## 2017-12-13 DIAGNOSIS — K31819 Angiodysplasia of stomach and duodenum without bleeding: Secondary | ICD-10-CM | POA: Insufficient documentation

## 2017-12-13 DIAGNOSIS — E538 Deficiency of other specified B group vitamins: Secondary | ICD-10-CM | POA: Diagnosis not present

## 2017-12-13 DIAGNOSIS — K219 Gastro-esophageal reflux disease without esophagitis: Secondary | ICD-10-CM | POA: Insufficient documentation

## 2017-12-13 DIAGNOSIS — Z7984 Long term (current) use of oral hypoglycemic drugs: Secondary | ICD-10-CM | POA: Insufficient documentation

## 2017-12-13 DIAGNOSIS — K317 Polyp of stomach and duodenum: Secondary | ICD-10-CM | POA: Insufficient documentation

## 2017-12-13 DIAGNOSIS — K2970 Gastritis, unspecified, without bleeding: Secondary | ICD-10-CM | POA: Diagnosis not present

## 2017-12-13 DIAGNOSIS — E119 Type 2 diabetes mellitus without complications: Secondary | ICD-10-CM | POA: Insufficient documentation

## 2017-12-13 DIAGNOSIS — Q2733 Arteriovenous malformation of digestive system vessel: Secondary | ICD-10-CM | POA: Diagnosis not present

## 2017-12-13 DIAGNOSIS — R609 Edema, unspecified: Secondary | ICD-10-CM | POA: Diagnosis not present

## 2017-12-13 DIAGNOSIS — Z79899 Other long term (current) drug therapy: Secondary | ICD-10-CM | POA: Diagnosis not present

## 2017-12-13 DIAGNOSIS — I10 Essential (primary) hypertension: Secondary | ICD-10-CM | POA: Insufficient documentation

## 2017-12-13 DIAGNOSIS — K3189 Other diseases of stomach and duodenum: Secondary | ICD-10-CM | POA: Diagnosis not present

## 2017-12-13 DIAGNOSIS — E7800 Pure hypercholesterolemia, unspecified: Secondary | ICD-10-CM | POA: Diagnosis not present

## 2017-12-13 DIAGNOSIS — K259 Gastric ulcer, unspecified as acute or chronic, without hemorrhage or perforation: Secondary | ICD-10-CM | POA: Diagnosis not present

## 2017-12-13 DIAGNOSIS — K2950 Unspecified chronic gastritis without bleeding: Secondary | ICD-10-CM | POA: Diagnosis not present

## 2017-12-13 DIAGNOSIS — K2960 Other gastritis without bleeding: Secondary | ICD-10-CM | POA: Diagnosis not present

## 2017-12-13 HISTORY — PX: ESOPHAGOGASTRODUODENOSCOPY (EGD) WITH PROPOFOL: SHX5813

## 2017-12-13 HISTORY — DX: Other specified disorders of nose and nasal sinuses: J34.89

## 2017-12-13 LAB — GLUCOSE, CAPILLARY: Glucose-Capillary: 122 mg/dL — ABNORMAL HIGH (ref 65–99)

## 2017-12-13 SURGERY — ESOPHAGOGASTRODUODENOSCOPY (EGD) WITH PROPOFOL
Anesthesia: General

## 2017-12-13 MED ORDER — LIDOCAINE HCL (PF) 2 % IJ SOLN
INTRAMUSCULAR | Status: AC
  Filled 2017-12-13: qty 10

## 2017-12-13 MED ORDER — PROPOFOL 500 MG/50ML IV EMUL
INTRAVENOUS | Status: AC
  Filled 2017-12-13: qty 50

## 2017-12-13 MED ORDER — GLYCOPYRROLATE 0.2 MG/ML IJ SOLN
INTRAMUSCULAR | Status: AC
  Filled 2017-12-13: qty 1

## 2017-12-13 MED ORDER — LIDOCAINE HCL (CARDIAC) PF 100 MG/5ML IV SOSY
PREFILLED_SYRINGE | INTRAVENOUS | Status: DC | PRN
  Administered 2017-12-13: 50 mg via INTRAVENOUS

## 2017-12-13 MED ORDER — SODIUM CHLORIDE 0.9 % IV SOLN: INTRAVENOUS | Status: DC

## 2017-12-13 MED ORDER — PROPOFOL 500 MG/50ML IV EMUL
INTRAVENOUS | Status: DC | PRN
  Administered 2017-12-13: 175 ug/kg/min via INTRAVENOUS

## 2017-12-13 MED ORDER — PROPOFOL 10 MG/ML IV BOLUS
INTRAVENOUS | Status: DC | PRN
  Administered 2017-12-13: 20 mg via INTRAVENOUS
  Administered 2017-12-13: 50 mg via INTRAVENOUS

## 2017-12-13 MED ORDER — SODIUM CHLORIDE 0.9 % IV SOLN
INTRAVENOUS | Status: DC
  Administered 2017-12-13: 09:00:00 via INTRAVENOUS

## 2017-12-13 NOTE — Op Note (Signed)
[facility] Patient Name: Misty Jones Procedure Date: 12/13/2017 8:54 AM MRN: [id number] Account #: [id number] Date of Birth: [birth year] Admit Type: Outpatient Age: 77 Room: [facility] ENDO ROOM 3 Gender: Female Note Status: Finalized Procedure:            Upper GI endoscopy Indications:          Heartburn, Suspected gastro-esophageal reflux disease Providers:            Manya Silvas, MD Referring MD:         [name], MD (Referring MD) Medicines:            Propofol per Anesthesia Complications:        No immediate complications. Procedure:            Pre-Anesthesia Assessment:                       - After reviewing the risks and benefits, the patient                        was deemed in satisfactory condition to undergo the                        procedure.                       After obtaining informed consent, the endoscope was                        passed under direct vision. Throughout the procedure,                        the patient's blood pressure, pulse, and oxygen                        saturations were monitored continuously. The Endoscope                        was introduced through the mouth, and advanced to the                        second part of duodenum. The upper GI endoscopy was                        accomplished without difficulty. The patient tolerated                        the procedure well. Findings:      The examined esophagus was normal.      A single small no bleeding angioectasia was found on the anterior wall       of the stomach. Biopsies were taken with a cold forceps for histology.       Biopsies were taken with a cold forceps for Helicobacter pylori testing.       To prevent bleeding after the biopsy, one hemostatic clip was       successfully placed. There was no bleeding at the end of the procedure.      A single small sessile polyp with no bleeding and no stigmata of recent       bleeding was  found in the prepyloric region of the stomach. The polyp  was biopsied.      There were scattered small spots of old blood in the stomach.      The examined duodenum was normal. Impression:           - Normal esophagus.                       - A single non-bleeding angioectasia in the stomach.                        Biopsied. Clip was placed.                       - A single gastric polyp. Incomplete resection.                       - Normal examined duodenum. Recommendation:       - Await pathology results. Manya Silvas, MD 12/13/2017 9:36:15 AM This report has been signed electronically. Number of Addenda: 0 Note Initiated On: 12/13/2017 8:54 AM      [facility]

## 2017-12-13 NOTE — H&P (Signed)
Primary Care Physician:  [name], MD Primary Gastroenterologist:  Dr. Vira Agar  Pre-Procedure History & Physical: HPI:  Misty Jones is a 77 y.o. female is here for an endoscopy. For Gerd and heartburn.    Past Medical History:  Diagnosis Date  . B12 deficiency   . Chicken pox   . Diabetes mellitus ([place])   . GERD (gastroesophageal reflux disease)   . History of diverticulitis of colon   . Hypercholesterolemia   . Hypertension   . Nasal drainage     Past Surgical History:  Procedure Laterality Date  . ABDOMINAL HYSTERECTOMY  1984  . BACK SURGERY  1986   ruptured disc, Dr Hardin Negus [facility])  . CHOLECYSTECTOMY  1991  . CHOLECYSTECTOMY  1992  . COLONOSCOPY WITH PROPOFOL N/A 06/29/2016   Procedure: COLONOSCOPY WITH PROPOFOL;  Surgeon: Manya Silvas, MD;  Location: [facility] ENDOSCOPY;  Service: Endoscopy;  Laterality: N/A;  . TONSILLECTOMY      Prior to Admission medications   Medication Sig Start Date End Date Taking? Authorizing Provider  aspirin 81 MG tablet Take 81 mg by mouth daily.   Yes [provider]  benazepril (LOTENSIN) 40 MG tablet TAKE 1 TABLET BY MOUTH ONCE DAILY 10/01/17  Yes [name], MD  guaiFENesin (ROBITUSSIN CHEST CONGESTION PO) Take 30 mg by mouth every 4 (four) hours as needed.   Yes [provider]  hydrochlorothiazide (HYDRODIURIL) 25 MG tablet TAKE ONE-HALF TABLET BY MOUTH ONCE DAILY 10/01/17  Yes [name], MD  Melatonin 10 MG TABS Take by mouth.   Yes [provider]  metFORMIN (GLUCOPHAGE) 500 MG tablet TAKE 1 TABLET BY MOUTH ONCE DAILY 08/26/17  Yes Crecencio Mc, MD  omeprazole (PRILOSEC) 20 MG capsule Take 20 mg by mouth daily.   Yes [provider]  acyclovir (ZOVIRAX) 400 MG tablet Take 1 tablet (400 mg total) by mouth 2 (two) times daily. Patient not taking: Reported on 12/13/2017 08/30/17   [name], MD  BAYER CONTOUR TEST test strip USE ONE STRIP TO CHECK GLUCOSE ONCE DAILY  06/21/17   [name], MD  beta carotene w/minerals (OCUVITE) tablet Take 1 tablet by mouth daily.    [provider]  Calcium Carbonate-Vitamin D (CALCIUM 600+D) 600-400 MG-UNIT per tablet Take 1 tablet by mouth 2 (two) times daily.    [provider]  Cholecalciferol (VITAMIN D) 2000 UNITS tablet Take 1,000 Units by mouth daily.     [provider]  cyanocobalamin (,VITAMIN B-12,) 1000 MCG/ML injection INJECT 1 ML (1,000 MCG TOTAL) INTO THE MUSCLE EVERY 30 (THIRTY) DAYS. 12/17/16   [name], MD  fish oil-omega-3 fatty acids 1000 MG capsule Take 1 g by mouth 2 (two) times daily.    [provider]  glucose blood (BAYER CONTOUR TEST) test strip 1 each by Other route daily. Use as instructed 06/24/17   [name], MD  Lancets MISC Check sugars once a day Dx: E11.9 (Contour Next) 07/04/15   [name], MD  niacin 100 MG tablet Take 100 mg by mouth at bedtime.    [provider]  nitroGLYCERIN (NITROSTAT) 0.4 MG SL tablet Place under the tongue. 10/05/16 10/05/17  [provider]  pantoprazole (PROTONIX) 40 MG tablet Take 1 tablet (40 mg total) by mouth 2 (two) times daily before a meal. Patient not taking: Reported on 12/13/2017 04/23/17   [name], MD  pravastatin (PRAVACHOL) 10 MG tablet Take 1 tablet (10 mg total) by mouth daily. 08/26/17  [name], MD  sucralfate (CARAFATE) 1 g tablet Take 1 g by mouth 4 (four) times daily -  with meals and at bedtime.    [provider]    Allergies as of 09/29/2017 - Review Complete 08/29/2017  Allergen Reaction Noted  . Augmentin [amoxicillin-pot clavulanate] Other (See Comments) 06/07/2012    Family History  Problem Relation Age of Onset  . Hypertension Mother   . Cancer Mother        Mouth cancer  . Heart disease Father        myocardial infarction  . Raynaud syndrome Sister   . Asthma Sister   . Congenital heart disease Sister   . Thyroid disease Sister    . Thyroid cancer Daughter   . Breast cancer Neg Hx   . Colon cancer Neg Hx     Social History   Socioeconomic History  . Marital status: Divorced    Spouse name: Not on file  . Number of children: 2  . Years of education: Not on file  . Highest education level: Not on file  Occupational History  . Occupation: retired  Scientific laboratory technician  . Financial resource strain: Not on file  . Food insecurity:    Worry: Not on file    Inability: Not on file  . Transportation needs:    Medical: Not on file    Non-medical: Not on file  Tobacco Use  . Smoking status: Former Smoker    Packs/day: 0.50    Years: 20.00    Pack years: 10.00    Last attempt to quit: 07/26/1984    Years since quitting: 33.4  . Smokeless tobacco: Never Used  . Tobacco comment: quit smoking years ago  Substance and Sexual Activity  . Alcohol use: No    Alcohol/week: 0.0 oz  . Drug use: No  . Sexual activity: Never  Lifestyle  . Physical activity:    Days per week: Not on file    Minutes per session: Not on file  . Stress: Not on file  Relationships  . Social connections:    Talks on phone: Not on file    Gets together: Not on file    Attends religious service: Not on file    Active member of club or organization: Not on file    Attends meetings of clubs or organizations: Not on file    Relationship status: Not on file  . Intimate partner violence:    Fear of current or ex partner: Not on file    Emotionally abused: Not on file    Physically abused: Not on file    Forced sexual activity: Not on file  Other Topics Concern  . Not on file  Social History Narrative  . Not on file    Review of Systems: See HPI, otherwise negative ROS  Physical Exam: BP 138/87   Pulse 87   Temp (!) 96.6 F (35.9 C) (Tympanic)   Resp 18   Ht 5\' 4"  (1.626 m)   Wt 92.5 kg (204 lb)   SpO2 99%   BMI 35.02 kg/m  General:   Alert,  pleasant and cooperative in NAD Head:  Normocephalic and atraumatic. Neck:  Supple;  no masses or thyromegaly. Lungs:  Clear throughout to auscultation.    Heart:  Regular rate and rhythm. Abdomen:  Soft, nontender and nondistended. Normal bowel sounds, without guarding, and without rebound.   Neurologic:  Alert and  oriented x4;  grossly normal neurologically.  Impression/Plan: Vickii Chafe  E Cure is here for an endoscopy to be performed for GERD and heartburn  Risks, benefits, limitations, and alternatives regarding  endoscopy have been reviewed with the patient.  Questions have been answered.  All parties agreeable.   Gaylyn Cheers, MD  12/13/2017, 9:05 AM

## 2017-12-13 NOTE — Transfer of Care (Signed)
Immediate Anesthesia Transfer of Care Note  Patient: Misty Jones  Procedure(s) Performed: ESOPHAGOGASTRODUODENOSCOPY (EGD) WITH PROPOFOL (N/A )  Patient Location: PACU  Anesthesia Type:General  Level of Consciousness: sedated  Airway & Oxygen Therapy: Patient Spontanous Breathing and Patient connected to nasal cannula oxygen  Post-op Assessment: Report given to RN and Post -op Vital signs reviewed and stable  Post vital signs: Reviewed and stable  Last Vitals:  Vitals Value Taken Time  BP 109/85 12/13/2017  9:35 AM  Temp 36.1 C 12/13/2017  9:35 AM  Pulse 82 12/13/2017  9:35 AM  Resp 19 12/13/2017  9:35 AM  SpO2 100 % 12/13/2017  9:35 AM  Vitals shown include unvalidated device data.  Last Pain:  Vitals:   12/13/17 0935  TempSrc: Tympanic  PainSc: 0-No pain         Complications: No apparent anesthesia complications

## 2017-12-13 NOTE — Anesthesia Preprocedure Evaluation (Signed)
Anesthesia Evaluation  Patient identified by MRN, date of birth, ID band Patient awake    Reviewed: Allergy & Precautions, NPO status , Patient's Chart, lab work & pertinent test results  History of Anesthesia Complications Negative for: history of anesthetic complications  Airway Mallampati: III  TM Distance: >3 FB Neck ROM: Full    Dental no notable dental hx.    Pulmonary neg sleep apnea, neg COPD, former smoker,    breath sounds clear to auscultation- rhonchi (-) wheezing      Cardiovascular hypertension, Pt. on medications (-) CAD, (-) Past MI, (-) Cardiac Stents and (-) CABG  Rhythm:Regular Rate:Normal - Systolic murmurs and - Diastolic murmurs    Neuro/Psych negative neurological ROS  negative psych ROS   GI/Hepatic Neg liver ROS, GERD  ,  Endo/Other  diabetes, Oral Hypoglycemic Agents  Renal/GU negative Renal ROS     Musculoskeletal   Abdominal (+) + obese,   Peds  Hematology negative hematology ROS (+)   Anesthesia Other Findings Past Medical History: No date: B12 deficiency No date: Chicken pox No date: Diabetes mellitus ([facility]) No date: GERD (gastroesophageal reflux disease) No date: History of diverticulitis of colon No date: Hypercholesterolemia No date: Hypertension No date: Nasal drainage   Reproductive/Obstetrics                             Anesthesia Physical Anesthesia Plan  ASA: II  Anesthesia Plan: General   Post-op Pain Management:    Induction: Intravenous  PONV Risk Score and Plan: 2 and Propofol infusion  Airway Management Planned: Natural Airway  Additional Equipment:   Intra-op Plan:   Post-operative Plan:   Informed Consent: I have reviewed the patients History and Physical, chart, labs and discussed the procedure including the risks, benefits and alternatives for the proposed anesthesia with the patient or authorized representative who has  indicated his/her understanding and acceptance.   Dental advisory given  Plan Discussed with: CRNA and Anesthesiologist  Anesthesia Plan Comments:         Anesthesia Quick Evaluation

## 2017-12-13 NOTE — Anesthesia Procedure Notes (Signed)
Date/Time: 12/13/2017 9:07 AM Performed by: Johnna Acosta, CRNA Pre-anesthesia Checklist: Patient identified, Emergency Drugs available, Suction available, Patient being monitored and Timeout performed Patient Re-evaluated:Patient Re-evaluated prior to induction Oxygen Delivery Method: Nasal cannula Preoxygenation: Pre-oxygenation with 100% oxygen

## 2017-12-13 NOTE — Anesthesia Postprocedure Evaluation (Signed)
Anesthesia Post Note  Patient: Misty Jones  Procedure(s) Performed: ESOPHAGOGASTRODUODENOSCOPY (EGD) WITH PROPOFOL (N/A )  Patient location during evaluation: Endoscopy Anesthesia Type: General Level of consciousness: awake and alert and oriented Pain management: pain level controlled Vital Signs Assessment: post-procedure vital signs reviewed and stable Respiratory status: spontaneous breathing, nonlabored ventilation and respiratory function stable Cardiovascular status: blood pressure returned to baseline and stable Postop Assessment: no signs of nausea or vomiting Anesthetic complications: no     Last Vitals:  Vitals:   12/13/17 0935 12/13/17 0954  BP: 109/85 122/89  Pulse: 81   Resp: (!) 23   Temp: (!) 36.1 C   SpO2: 100%     Last Pain:  Vitals:   12/13/17 0954  TempSrc:   PainSc: 0-No pain                 Haddon Fyfe

## 2017-12-13 NOTE — Anesthesia Post-op Follow-up Note (Signed)
Anesthesia QCDR form completed.        

## 2017-12-14 ENCOUNTER — Encounter: Payer: Self-pay | Admitting: Unknown Physician Specialty

## 2017-12-14 ENCOUNTER — Ambulatory Visit: Payer: BLUE CROSS/BLUE SHIELD

## 2017-12-15 LAB — SURGICAL PATHOLOGY

## 2017-12-24 ENCOUNTER — Ambulatory Visit (INDEPENDENT_AMBULATORY_CARE_PROVIDER_SITE_OTHER): Payer: Medicare Other

## 2017-12-24 VITALS — BP 118/76 | HR 80 | Temp 98.0°F | Ht 64.0 in | Wt 205.4 lb

## 2017-12-24 DIAGNOSIS — Z0000 Encounter for general adult medical examination without abnormal findings: Secondary | ICD-10-CM

## 2017-12-24 NOTE — Patient Instructions (Addendum)
  Misty Jones , Thank you for taking time to come for your Medicare Wellness Visit. I appreciate your ongoing commitment to your health goals. Please review the following plan we discussed and let me know if I can assist you in the future.   Follow up as needed.    Bring a copy of your [place] and/or Living Will to be scanned into chart.  Have a great day!  These are the goals we discussed: Goals    . DIET - INCREASE LEAN PROTEINS     Low carb foods. Educational material provided.    Marland Kitchen DIET - REDUCE SUGAR INTAKE     Portion control    . Increase physical activity     Walk for exercise, as tolerated       This is a list of the screening recommended for you and due dates:  Health Maintenance  Topic Date Due  . Hemoglobin A1C  02/20/2018  . Flu Shot  02/24/2018  . Eye exam for diabetics  04/28/2018  . Mammogram  05/05/2018  . Complete foot exam   08/26/2018  . Tetanus Vaccine  12/29/2020  . DEXA scan (bone density measurement)  Completed  . Pneumonia vaccines  Completed

## 2017-12-24 NOTE — Progress Notes (Addendum)
Subjective:   Misty Jones is a 77 y.o. female who presents for Medicare Annual (Subsequent) preventive examination.  Review of Systems:  No ROS.  Medicare Wellness Visit. Additional risk factors are reflected in the social history.  Cardiac Risk Factors include: advanced age (>64men, >56 women);hypertension;diabetes mellitus     Objective:     Vitals: BP 118/76 (BP Location: Left Arm, Patient Position: Sitting, Cuff Size: Normal)   Pulse 80   Temp 98 F (36.7 C) (Oral)   Ht 5\' 4"  (1.626 m)   Wt 205 lb 6.4 oz (93.2 kg)   SpO2 96%   BMI 35.26 kg/m   Body mass index is 35.26 kg/m.  Advanced Directives 12/13/2017 12/11/2016 09/13/2016 06/29/2016 03/25/2016 12/12/2015  Does Patient Have a Medical Advance Directive? Yes Yes No Yes Yes Yes  Type of Paramedic of [place];Living will Living will - Living will [place];Living will Living will  Does patient want to make changes to medical advance directive? - No - Patient declined - - - -  Copy of [place] in Chart? No - copy requested - - - - No - copy requested  Would patient like information on creating a medical advance directive? - - No - Patient declined - - -    Tobacco Social History   Tobacco Use  Smoking Status Former Smoker  . Packs/day: 0.50  . Years: 20.00  . Pack years: 10.00  . Last attempt to quit: 07/26/1984  . Years since quitting: 33.4  Smokeless Tobacco Never Used  Tobacco Comment   quit smoking years ago     Counseling given: Not Answered Comment: quit smoking years ago   Clinical Intake:  Pre-visit preparation completed: Yes  Pain : No/denies pain     Nutritional Status: BMI > 30  Obese Diabetes: Yes(Followed by pcp)  How often do you need to have someone help you when you read instructions, pamphlets, or other written materials from your doctor or pharmacy?: 1 - Never  Interpreter Needed?: No     Past Medical History:    Diagnosis Date  . B12 deficiency   . Chicken pox   . Diabetes mellitus ([place])   . GERD (gastroesophageal reflux disease)   . History of diverticulitis of colon   . Hypercholesterolemia   . Hypertension   . Nasal drainage    Past Surgical History:  Procedure Laterality Date  . ABDOMINAL HYSTERECTOMY  1984  . BACK SURGERY  1986   ruptured disc, Dr Hardin [facility])  . CHOLECYSTECTOMY  1991  . CHOLECYSTECTOMY  1992  . COLONOSCOPY WITH PROPOFOL N/A 06/29/2016   Procedure: COLONOSCOPY WITH PROPOFOL;  Surgeon: Manya Silvas, MD;  Location: [facility] ENDOSCOPY;  Service: Endoscopy;  Laterality: N/A;  . ESOPHAGOGASTRODUODENOSCOPY (EGD) WITH PROPOFOL N/A 12/13/2017   Procedure: ESOPHAGOGASTRODUODENOSCOPY (EGD) WITH PROPOFOL;  Surgeon: Manya Silvas, MD;  [facility] ENDOSCOPY;  Service: Endoscopy;  Laterality: N/A;  . TONSILLECTOMY     Family History  Problem Relation Age of Onset  . Hypertension Mother   . Cancer Mother        Mouth cancer  . Heart disease Father        myocardial infarction  . Raynaud syndrome Sister   . Asthma Sister   . Congenital heart disease Sister   . Thyroid disease Sister   . Thyroid cancer Daughter   . Breast cancer Neg Hx   . Colon cancer Neg Hx  Social History   Socioeconomic History  . Marital status: Divorced    Spouse name: Not on file  . Number of children: 2  . Years of education: Not on file  . Highest education level: Not on file  Occupational History  . Occupation: retired  Scientific laboratory technician  . Financial resource strain: Not hard at all  . Food insecurity:    Worry: Never true    Inability: Never true  . Transportation needs:    Medical: No    Non-medical: No  Tobacco Use  . Smoking status: Former Smoker    Packs/day: 0.50    Years: 20.00    Pack years: 10.00    Last attempt to quit: 07/26/1984    Years since quitting: 33.4  . Smokeless tobacco: Never Used  . Tobacco comment: quit smoking years ago  Substance and  Sexual Activity  . Alcohol use: No    Alcohol/week: 0.0 oz  . Drug use: No  . Sexual activity: Never  Lifestyle  . Physical activity:    Days per week: Not on file    Minutes per session: Not on file  . Stress: Not on file  Relationships  . Social connections:    Talks on phone: Not on file    Gets together: Not on file    Attends religious service: Not on file    Active member of club or organization: Not on file    Attends meetings of clubs or organizations: Not on file    Relationship status: Not on file  Other Topics Concern  . Not on file  Social History Narrative  . Not on file    Outpatient Encounter Medications as of 12/24/2017  Medication Sig  . acyclovir (ZOVIRAX) 400 MG tablet Take 1 tablet (400 mg total) by mouth 2 (two) times daily.  Marland Kitchen aspirin 81 MG tablet Take 81 mg by mouth daily.  Marland Kitchen BAYER CONTOUR TEST test strip USE ONE STRIP TO CHECK GLUCOSE ONCE DAILY  . benazepril (LOTENSIN) 40 MG tablet TAKE 1 TABLET BY MOUTH ONCE DAILY  . beta carotene w/minerals (OCUVITE) tablet Take 1 tablet by mouth daily.  . Calcium Carbonate-Vitamin D (CALCIUM 600+D) 600-400 MG-UNIT per tablet Take 1 tablet by mouth 2 (two) times daily.  . Cholecalciferol (VITAMIN D) 2000 UNITS tablet Take 1,000 Units by mouth daily.   . cyanocobalamin (,VITAMIN B-12,) 1000 MCG/ML injection INJECT 1 ML (1,000 MCG TOTAL) INTO THE MUSCLE EVERY 30 (THIRTY) DAYS.  . fish oil-omega-3 fatty acids 1000 MG capsule Take 1 g by mouth 2 (two) times daily.  Marland Kitchen glucose blood (BAYER CONTOUR TEST) test strip 1 each by Other route daily. Use as instructed  . hydrochlorothiazide (HYDRODIURIL) 25 MG tablet TAKE ONE-HALF TABLET BY MOUTH ONCE DAILY  . Lancets MISC Check sugars once a day Dx: E11.9 (Contour Next)  . Melatonin 10 MG TABS Take by mouth.  . metFORMIN (GLUCOPHAGE) 500 MG tablet TAKE 1 TABLET BY MOUTH ONCE DAILY  . niacin 100 MG tablet Take 100 mg by mouth at bedtime.  Marland Kitchen omeprazole (PRILOSEC) 20 MG capsule Take  20 mg by mouth daily.  . pantoprazole (PROTONIX) 40 MG tablet Take 1 tablet (40 mg total) by mouth 2 (two) times daily before a meal.  . pravastatin (PRAVACHOL) 10 MG tablet Take 1 tablet (10 mg total) by mouth daily.  . sucralfate (CARAFATE) 1 g tablet Take 1 g by mouth 4 (four) times daily -  with meals and at bedtime.  . [  DISCONTINUED] guaiFENesin (ROBITUSSIN CHEST CONGESTION PO) Take 30 mg by mouth every 4 (four) hours as needed.  . nitroGLYCERIN (NITROSTAT) 0.4 MG SL tablet Place under the tongue.   No facility-administered encounter medications on file as of 12/24/2017.     Activities of Daily Living In your present state of health, do you have any difficulty performing the following activities: 12/24/2017  Hearing? N  Vision? N  Difficulty concentrating or making decisions? N  Walking or climbing stairs? N  Dressing or bathing? N  Doing errands, shopping? N  Preparing Food and eating ? N  Using the Toilet? N  In the past six months, have you accidently leaked urine? N  Do you have problems with loss of bowel control? N  Managing your Medications? N  Managing your Finances? N  Housekeeping or managing your Housekeeping? N  Some recent data might be hidden    Patient Care Team: [name], MD as PCP - General (Internal Medicine) [name], MD (Internal Medicine)    Assessment:   This is a routine wellness examination for [place].  The goal of the wellness visit is to assist the patient how to close the gaps in care and create a preventative care plan for the patient.   The roster of all physicians providing medical care to patient is listed in the Snapshot section of the chart.  Taking calcium VIT D as appropriate/Osteoporosis risk reviewed.    Safety issues reviewed; Smoke and carbon monoxide detectors in the home. No firearms in the home. Wears seatbelts when driving or riding with others. No violence in the home.  They do not have excessive sun exposure.   Discussed the need for sun protection: hats, long sleeves and the use of sunscreen if there is significant sun exposure.  Patient is alert, normal appearance, oriented to person/place/and time.  Correctly identified the president of the [place] and recalls of 3/3 words. Performs simple calculations and can read correct time from watch face. Displays appropriate judgement.  No new identified risk were noted.  No failures at ADL's or IADL's.    BMI- discussed the importance of a healthy diet, water intake and the benefits of aerobic exercise. Educational material provided.   24 hour diet recall: Regular diet  Dental- every 6 months.  Eye- Visual acuity not assessed per patient preference since they have regular follow up with the ophthalmologist.  Wears corrective lenses.  Sleep patterns- Sleeps 6 hours at night.  She plans to start taking Melatonin 10mg  for better rest.   Health maintenance gaps- closed.  Patient Concerns: None at this time. Follow up with PCP as needed.  Exercise Activities and Dietary recommendations Current Exercise Habits: The patient does not participate in regular exercise at present  Goals    . DIET - INCREASE LEAN PROTEINS     Low carb foods. Educational material provided.    Marland Kitchen DIET - REDUCE SUGAR INTAKE     Portion control    . Increase physical activity     Walk for exercise, as tolerated       Fall Risk Fall Risk  12/24/2017 12/11/2016 03/25/2016 12/12/2015 04/03/2015  Falls in the past year? No Yes No No No   Depression Screen PHQ 2/9 Scores 12/24/2017 12/11/2016 03/25/2016 12/12/2015  PHQ - 2 Score 0 0 0 0  PHQ- 9 Score - 0 - -     Cognitive Function MMSE - Mini Mental State Exam 12/11/2016 12/12/2015  Orientation to time 5 5  Orientation  to Place 5 5  Registration 3 3  Attention/ Calculation 5 5  Recall 3 3  Language- name 2 objects 2 2  Language- repeat 1 1  Language- follow 3 step command 3 3  Language- read & follow direction 1 1  Write  a sentence 1 1  Copy design 1 1  Total score 30 30     6CIT Screen 12/24/2017  What Year? 0 points  What month? 0 points  What time? 0 points  Count back from 20 0 points  Months in reverse 0 points  Repeat phrase 0 points  Total Score 0    Immunization History  Administered Date(s) Administered  . Influenza Split 05/31/2012, 04/16/2014  . Influenza, High Dose Seasonal PF 03/25/2016, 04/23/2017  . Influenza-Unspecified 04/26/2012, 05/31/2012, 05/03/2013, 04/16/2014, 05/08/2015, 03/25/2016  . Pneumococcal Conjugate-13 05/30/2013  . Pneumococcal Polysaccharide-23 12/12/2015  . Zoster 06/28/2012   Screening Tests Health Maintenance  Topic Date Due  . HEMOGLOBIN A1C  02/20/2018  . INFLUENZA VACCINE  02/24/2018  . OPHTHALMOLOGY EXAM  04/28/2018  . MAMMOGRAM  05/05/2018  . FOOT EXAM  08/26/2018  . TETANUS/TDAP  12/29/2020  . DEXA SCAN  Completed  . PNA vac Low Risk Adult  Completed      Plan:    End of life planning; Advance aging; Advanced directives discussed. Copy of current HCPOA/Living Will requested.    I have personally reviewed and noted the following in the patient's chart:   . Medical and social history . Use of alcohol, tobacco or illicit drugs  . Current medications and supplements . Functional ability and status . Nutritional status . Physical activity . Advanced directives . List of other physicians . Hospitalizations, surgeries, and ER visits in previous 12 months . Vitals . Screenings to include cognitive, depression, and falls . Referrals and appointments  In addition, I have reviewed and discussed with patient certain preventive protocols, quality metrics, and best practice recommendations. A written personalized care plan for preventive services as well as general preventive health recommendations were provided to patient.     Varney Biles, LPN  11/26/7739   Reviewed above information.  Agree with assessment and plan.    Dr  Nicki Reaper

## 2017-12-27 ENCOUNTER — Other Ambulatory Visit: Payer: Self-pay | Admitting: Internal Medicine

## 2017-12-27 ENCOUNTER — Telehealth: Payer: Self-pay | Admitting: Radiology

## 2017-12-27 DIAGNOSIS — E119 Type 2 diabetes mellitus without complications: Secondary | ICD-10-CM

## 2017-12-27 DIAGNOSIS — E7800 Pure hypercholesterolemia, unspecified: Secondary | ICD-10-CM

## 2017-12-27 NOTE — Progress Notes (Signed)
Orders placed for labs

## 2017-12-27 NOTE — Telephone Encounter (Signed)
Pt coming in for labs tomorrow, please place future orders. Thank you.  

## 2017-12-27 NOTE — Telephone Encounter (Signed)
Orders placed for labs

## 2017-12-28 ENCOUNTER — Other Ambulatory Visit (INDEPENDENT_AMBULATORY_CARE_PROVIDER_SITE_OTHER): Payer: Medicare Other

## 2017-12-28 DIAGNOSIS — E119 Type 2 diabetes mellitus without complications: Secondary | ICD-10-CM

## 2017-12-28 DIAGNOSIS — E7800 Pure hypercholesterolemia, unspecified: Secondary | ICD-10-CM | POA: Diagnosis not present

## 2017-12-28 LAB — BASIC METABOLIC PANEL
BUN: 17 mg/dL (ref 6–23)
CALCIUM: 9.5 mg/dL (ref 8.4–10.5)
CO2: 33 meq/L — AB (ref 19–32)
CREATININE: 0.9 mg/dL (ref 0.40–1.20)
Chloride: 101 mEq/L (ref 96–112)
GFR: 64.42 mL/min (ref >60.00)
GLUCOSE: 146 mg/dL — AB (ref 70–99)
Potassium: 4.9 mEq/L (ref 3.5–5.1)
Sodium: 141 mEq/L (ref 135–145)

## 2017-12-28 LAB — HEPATIC FUNCTION PANEL
ALBUMIN: 4 g/dL (ref 3.5–5.2)
ALK PHOS: 55 U/L (ref 39–117)
ALT: 13 U/L (ref 0–35)
AST: 14 U/L (ref 0–37)
BILIRUBIN DIRECT: 0 mg/dL (ref 0.0–0.3)
Total Bilirubin: 0.4 mg/dL (ref 0.2–1.2)
Total Protein: 6.3 g/dL (ref 6.0–8.3)

## 2017-12-28 LAB — LIPID PANEL
CHOL/HDL RATIO: 5
Cholesterol: 246 mg/dL — ABNORMAL HIGH (ref 0–200)
HDL: 48.9 mg/dL (ref >39.00)
LDL Cholesterol: 159 mg/dL — ABNORMAL HIGH (ref 0–99)
NONHDL: 197.28
Triglycerides: 192 mg/dL — ABNORMAL HIGH (ref 0.0–149.0)
VLDL: 38.4 mg/dL (ref 0.0–40.0)

## 2017-12-28 LAB — HEMOGLOBIN A1C: Hgb A1c MFr Bld: 7.1 % — ABNORMAL HIGH (ref 4.6–6.5)

## 2017-12-30 ENCOUNTER — Ambulatory Visit (INDEPENDENT_AMBULATORY_CARE_PROVIDER_SITE_OTHER): Payer: Medicare Other | Admitting: Internal Medicine

## 2017-12-30 ENCOUNTER — Encounter: Payer: Self-pay | Admitting: Internal Medicine

## 2017-12-30 ENCOUNTER — Ambulatory Visit (INDEPENDENT_AMBULATORY_CARE_PROVIDER_SITE_OTHER): Payer: Medicare Other

## 2017-12-30 VITALS — BP 130/78 | HR 83 | Temp 98.3°F | Resp 18 | Wt 205.2 lb

## 2017-12-30 DIAGNOSIS — K219 Gastro-esophageal reflux disease without esophagitis: Secondary | ICD-10-CM | POA: Diagnosis not present

## 2017-12-30 DIAGNOSIS — I10 Essential (primary) hypertension: Secondary | ICD-10-CM | POA: Diagnosis not present

## 2017-12-30 DIAGNOSIS — Z6835 Body mass index (BMI) 35.0-35.9, adult: Secondary | ICD-10-CM | POA: Diagnosis not present

## 2017-12-30 DIAGNOSIS — M545 Low back pain, unspecified: Secondary | ICD-10-CM | POA: Insufficient documentation

## 2017-12-30 DIAGNOSIS — M4807 Spinal stenosis, lumbosacral region: Secondary | ICD-10-CM | POA: Diagnosis not present

## 2017-12-30 DIAGNOSIS — E7800 Pure hypercholesterolemia, unspecified: Secondary | ICD-10-CM

## 2017-12-30 DIAGNOSIS — M5441 Lumbago with sciatica, right side: Secondary | ICD-10-CM

## 2017-12-30 DIAGNOSIS — E119 Type 2 diabetes mellitus without complications: Secondary | ICD-10-CM

## 2017-12-30 DIAGNOSIS — I700 Atherosclerosis of aorta: Secondary | ICD-10-CM

## 2017-12-30 DIAGNOSIS — R0981 Nasal congestion: Secondary | ICD-10-CM

## 2017-12-30 MED ORDER — METFORMIN HCL ER 500 MG PO TB24: 500.0000 mg | ORAL_TABLET | Freq: Two times a day (BID) | ORAL | 1 refills | Status: DC

## 2017-12-30 MED ORDER — EZETIMIBE 10 MG PO TABS: 10.0000 mg | ORAL_TABLET | Freq: Every day | ORAL | 1 refills | Status: DC

## 2017-12-30 NOTE — Patient Instructions (Signed)
Saline nasal spray - flush nose at least 2-3x/day  nasacort nasal spray - 2 sprays each nostril one time per day.  Do this in the evening.  

## 2017-12-30 NOTE — Progress Notes (Signed)
Patient ID: Misty Jones, female   DOB: [birth year], 77 y.o.   MRN: [id number]   Subjective:    Patient ID: Misty Jones, female    DOB: 04-14-1941, 77 y.o.   MRN: [id number]  HPI  Patient here for a scheduled follow up. Just saw cardiology 09/23/17.  Stable.  Recommended f/u in 6 months.  S/p EGD 12/13/17.  Following with GI.  On prilosec bid.  No upper symptoms now.  She has had some low back pain - into right leg.  Notices if she sits for a long time.  Worse when she first gets up.  Some better initially when moves around.  Also increased problems at night.  No injury.  Persistent.  Discussed labs.  a1c increased to 7.1.  Discussed diet and exercise.  Has been unable to exercise as much secondary to her back.  Discussed increasing metformin to bid.  She is in agreement.  Some loose stool.  Will change to extended release form.  See if helps.  No abdominal pain.  Eating.  No chest pain.  No sob.  Woke up this am with some nasal stuffiness and headache.  States when drains, headache resolves.  No chest congestion.  Also discussed cholesterol labs.  She is unable to take statin medication.  Discussed zetia.     Past Medical History:  Diagnosis Date  . B12 deficiency   . Chicken pox   . Diabetes mellitus ([place])   . GERD (gastroesophageal reflux disease)   . History of diverticulitis of colon   . Hypercholesterolemia   . Hypertension   . Nasal drainage    Past Surgical History:  Procedure Laterality Date  . ABDOMINAL HYSTERECTOMY  1984  . BACK SURGERY  1986   ruptured disc, Dr Hardin [facility])  . CHOLECYSTECTOMY  1991  . CHOLECYSTECTOMY  1992  . COLONOSCOPY WITH PROPOFOL N/A 06/29/2016   Procedure: COLONOSCOPY WITH PROPOFOL;  Surgeon: Manya Silvas, MD;  Location: [facility] ENDOSCOPY;  Service: Endoscopy;  Laterality: N/A;  . ESOPHAGOGASTRODUODENOSCOPY (EGD) WITH PROPOFOL N/A 12/13/2017   Procedure: ESOPHAGOGASTRODUODENOSCOPY (EGD) WITH PROPOFOL;  Surgeon: Manya Silvas, MD;   Location: [facility] ENDOSCOPY;  Service: Endoscopy;  Laterality: N/A;  . TONSILLECTOMY     Family History  Problem Relation Age of Onset  . Hypertension Mother   . Cancer Mother        Mouth cancer  . Heart disease Father        myocardial infarction  . Raynaud syndrome Sister   . Asthma Sister   . Congenital heart disease Sister   . Thyroid disease Sister   . Thyroid cancer Daughter   . Breast cancer Neg Hx   . Colon cancer Neg Hx    Social History   Socioeconomic History  . Marital status: Divorced    Spouse name: Not on file  . Number of children: 2  . Years of education: Not on file  . Highest education level: Not on file  Occupational History  . Occupation: retired  Scientific laboratory technician  . Financial resource strain: Not hard at all  . Food insecurity:    Worry: Never true    Inability: Never true  . Transportation needs:    Medical: No    Non-medical: No  Tobacco Use  . Smoking status: Former Smoker    Packs/day: 0.50    Years: 20.00    Pack years: 10.00    Last attempt to quit: 07/26/1984    Years since  quitting: 33.4  . Smokeless tobacco: Never Used  . Tobacco comment: quit smoking years ago  Substance and Sexual Activity  . Alcohol use: No    Alcohol/week: 0.0 oz  . Drug use: No  . Sexual activity: Never  Lifestyle  . Physical activity:    Days per week: Not on file    Minutes per session: Not on file  . Stress: Not on file  Relationships  . Social connections:    Talks on phone: Not on file    Gets together: Not on file    Attends religious service: Not on file    Active member of club or organization: Not on file    Attends meetings of clubs or organizations: Not on file    Relationship status: Not on file  Other Topics Concern  . Not on file  Social History Narrative  . Not on file    Outpatient Encounter Medications as of 12/30/2017  Medication Sig  . aspirin 81 MG tablet Take 81 mg by mouth daily.  Marland Kitchen BAYER CONTOUR TEST test strip USE ONE STRIP TO  CHECK GLUCOSE ONCE DAILY  . benazepril (LOTENSIN) 40 MG tablet TAKE 1 TABLET BY MOUTH ONCE DAILY  . Calcium Carbonate-Vitamin D (CALCIUM 600+D) 600-400 MG-UNIT per tablet Take 1 tablet by mouth 2 (two) times daily.  . Cholecalciferol (VITAMIN D) 2000 UNITS tablet Take 1,000 Units by mouth daily.   . cyanocobalamin (,VITAMIN B-12,) 1000 MCG/ML injection INJECT 1 ML (1,000 MCG TOTAL) INTO THE MUSCLE EVERY 30 (THIRTY) DAYS.  Marland Kitchen ezetimibe (ZETIA) 10 MG tablet Take 1 tablet (10 mg total) by mouth daily.  . fish oil-omega-3 fatty acids 1000 MG capsule Take 1 g by mouth 2 (two) times daily.  Marland Kitchen glucose blood (BAYER CONTOUR TEST) test strip 1 each by Other route daily. Use as instructed  . hydrochlorothiazide (HYDRODIURIL) 25 MG tablet TAKE ONE-HALF TABLET BY MOUTH ONCE DAILY  . Lancets MISC Check sugars once a day Dx: E11.9 (Contour Next)  . Melatonin 10 MG TABS Take by mouth.  . metFORMIN (GLUCOPHAGE XR) 500 MG 24 hr tablet Take 1 tablet (500 mg total) by mouth 2 (two) times daily.  . niacin 100 MG tablet Take 100 mg by mouth at bedtime.  Marland Kitchen omeprazole (PRILOSEC) 20 MG capsule Take 20 mg by mouth daily.  . [DISCONTINUED] acyclovir (ZOVIRAX) 400 MG tablet Take 1 tablet (400 mg total) by mouth 2 (two) times daily.  . [DISCONTINUED] beta carotene w/minerals (OCUVITE) tablet Take 1 tablet by mouth daily.  . [DISCONTINUED] metFORMIN (GLUCOPHAGE) 500 MG tablet TAKE 1 TABLET BY MOUTH ONCE DAILY  . [DISCONTINUED] nitroGLYCERIN (NITROSTAT) 0.4 MG SL tablet Place under the tongue.  . [DISCONTINUED] pantoprazole (PROTONIX) 40 MG tablet Take 1 tablet (40 mg total) by mouth 2 (two) times daily before a meal.  . [DISCONTINUED] pravastatin (PRAVACHOL) 10 MG tablet Take 1 tablet (10 mg total) by mouth daily.  . [DISCONTINUED] sucralfate (CARAFATE) 1 g tablet Take 1 g by mouth 4 (four) times daily -  with meals and at bedtime.   No facility-administered encounter medications on file as of 12/30/2017.     Review of  Systems  Constitutional: Negative for appetite change and unexpected weight change.  HENT: Positive for congestion and postnasal drip.   Respiratory: Negative for cough, chest tightness and shortness of breath.   Cardiovascular: Negative for chest pain, palpitations and leg swelling.  Gastrointestinal: Negative for abdominal pain, nausea and vomiting.  Some loose stool.   Genitourinary: Negative for difficulty urinating and dysuria.  Musculoskeletal: Positive for back pain. Negative for joint swelling.  Skin: Negative for color change and rash.  Neurological: Negative for dizziness and light-headedness.       Some headache with congestion as outlined.  No significant headache.   Psychiatric/Behavioral: Negative for agitation and dysphoric mood.       Objective:    Physical Exam  Constitutional: She appears well-developed and well-nourished. No distress.  HENT:  Nose: Nose normal.  Mouth/Throat: Oropharynx is clear and moist.  Neck: Neck supple. No thyromegaly present.  Cardiovascular: Normal rate and regular rhythm.  Pulmonary/Chest: Breath sounds normal. No respiratory distress. She has no wheezes.  Abdominal: Soft. Bowel sounds are normal. There is no tenderness.  Musculoskeletal: She exhibits no edema or tenderness.  Lymphadenopathy:    She has no cervical adenopathy.  Skin: No rash noted. No erythema.  Psychiatric: She has a normal mood and affect. Her behavior is normal.    BP 130/78 (BP Location: Left Arm, Patient Position: Sitting, Cuff Size: Large)   Pulse 83   Temp 98.3 F (36.8 C) (Oral)   Resp 18   Wt 205 lb 3.2 oz (93.1 kg)   SpO2 96%   BMI 35.22 kg/m  Wt Readings from Last 3 Encounters:  12/30/17 205 lb 3.2 oz (93.1 kg)  12/24/17 205 lb 6.4 oz (93.2 kg)  12/13/17 204 lb (92.5 kg)     Lab Results  Component Value Date   WBC 7.7 08/23/2017   HGB 13.5 08/23/2017   HCT 40.8 08/23/2017   PLT 306.0 08/23/2017   GLUCOSE 146 (H) 12/28/2017   CHOL 246  (H) 12/28/2017   TRIG 192.0 (H) 12/28/2017   HDL 48.90 12/28/2017   LDLDIRECT 171.2 05/26/2013   LDLCALC 159 (H) 12/28/2017   ALT 13 12/28/2017   AST 14 12/28/2017   NA 141 12/28/2017   K 4.9 12/28/2017   CL 101 12/28/2017   CREATININE 0.90 12/28/2017   BUN 17 12/28/2017   CO2 33 (H) 12/28/2017   TSH 4.38 08/23/2017   INR 0.9 04/03/2012   HGBA1C 7.1 (H) 12/28/2017   MICROALBUR 0.7 08/26/2017       Assessment & Plan:   Problem List Items Addressed This Visit    Aortic atherosclerosis ([place])    Unable to take statin medication.  D/w her today.  Start zetia.        Relevant Medications   ezetimibe (ZETIA) 10 MG tablet   BMI 35.0-35.9,adult    Discussed diet and exercise.  Follow.        Diabetes mellitus ([place])    a1c just checked 7.1.  Discussed low carb diet and exercise.  Increase metformin to bid.  With some loose stool.  Will change to extended release form.  Follow met b and a1c.        Relevant Medications   metFORMIN (GLUCOPHAGE XR) 500 MG 24 hr tablet   Other Relevant Orders   Hemoglobin N0I   Basic metabolic panel   GERD (gastroesophageal reflux disease)    On PPI now bid.  Controlled.  Follow.  Sees GI.       Hypercholesterolemia    Has tried multiple statin medications.  Intolerant.  Discussed cholesterol and calculated cholesterol risk.  Will try zetia.  Follow.        Relevant Medications   ezetimibe (ZETIA) 10 MG tablet   Other Relevant Orders   Hepatic function panel  Lipid panel   Hypertension    Blood pressure under good control.  Continue same medication regimen.  Follow pressures.  Follow metabolic panel.        Relevant Medications   ezetimibe (ZETIA) 10 MG tablet   Other Relevant Orders   CBC with Differential/Platelet   TSH   Low back pain    Persistent issue.  Check xray.  Tylenol as directed.  Follow.       Relevant Orders   DG Lumbar Spine 2-3 Views (Completed)    Other Visit Diagnoses    Nasal congestion    -  Primary    Some nasal congestion and headache.  Resolves with drainage.  Saline nasal spray and steroid nasal spray as directed.  Follow.        [name], MD

## 2017-12-31 ENCOUNTER — Other Ambulatory Visit: Payer: Self-pay | Admitting: Internal Medicine

## 2017-12-31 DIAGNOSIS — M5441 Lumbago with sciatica, right side: Secondary | ICD-10-CM

## 2017-12-31 NOTE — Progress Notes (Signed)
Order placed for physical therapy.  

## 2018-01-02 ENCOUNTER — Encounter: Payer: Self-pay | Admitting: Internal Medicine

## 2018-01-02 NOTE — Assessment & Plan Note (Signed)
Has tried multiple statin medications.  Intolerant.  Discussed cholesterol and calculated cholesterol risk.  Will try zetia.  Follow.

## 2018-01-02 NOTE — Assessment & Plan Note (Signed)
Unable to take statin medication.  D/w her today.  Start zetia.

## 2018-01-02 NOTE — Assessment & Plan Note (Signed)
Discussed diet and exercise.  Follow.  

## 2018-01-02 NOTE — Assessment & Plan Note (Signed)
a1c just checked 7.1.  Discussed low carb diet and exercise.  Increase metformin to bid.  With some loose stool.  Will change to extended release form.  Follow met b and a1c.

## 2018-01-02 NOTE — Assessment & Plan Note (Signed)
Persistent issue.  Check xray.  Tylenol as directed.  Follow.

## 2018-01-02 NOTE — Assessment & Plan Note (Signed)
Blood pressure under good control.  Continue same medication regimen.  Follow pressures.  Follow metabolic panel.   

## 2018-01-02 NOTE — Assessment & Plan Note (Signed)
On PPI now bid.  Controlled.  Follow.  Sees GI.

## 2018-01-25 ENCOUNTER — Ambulatory Visit: Payer: Medicare Other

## 2018-01-31 ENCOUNTER — Ambulatory Visit: Payer: Medicare Other | Attending: Internal Medicine

## 2018-01-31 DIAGNOSIS — M5441 Lumbago with sciatica, right side: Secondary | ICD-10-CM | POA: Insufficient documentation

## 2018-01-31 DIAGNOSIS — G8929 Other chronic pain: Secondary | ICD-10-CM | POA: Diagnosis not present

## 2018-01-31 DIAGNOSIS — M6281 Muscle weakness (generalized): Secondary | ICD-10-CM | POA: Diagnosis not present

## 2018-01-31 NOTE — Patient Instructions (Signed)
   Transversus abdominis contraction.    Pretend that there is a string attached from one side of your pelvis to the other.    Tighten that "string."   Hold for 5 seconds.   Perform throughout the day.    (Try at least 3 sets of 10 daily)

## 2018-01-31 NOTE — Therapy (Signed)
[facility] [address] [place], [address] Phone: [phone]   Fax:  [phone]  Physical Therapy Evaluation  Patient Details  Name: Misty Jones MRN: [id number] Date of Birth: September 08, 1940 Referring Provider: [name], MD   Encounter Date: 01/31/2018  PT End of Session - 01/31/18 1608    Visit Number  1    Number of Visits  11    Date for PT Re-Evaluation  03/10/18    Authorization Type  1    Authorization Time Period  of 10    PT Start Time  3545    PT Stop Time  1705    PT Time Calculation (min)  56 min    Activity Tolerance  Patient tolerated treatment well    Behavior During Therapy  [facility] for tasks assessed/performed       Past Medical History:  Diagnosis Date  . B12 deficiency   . Chicken pox   . Diabetes mellitus ([place])   . GERD (gastroesophageal reflux disease)   . History of diverticulitis of colon   . Hypercholesterolemia   . Hypertension   . Nasal drainage     Past Surgical History:  Procedure Laterality Date  . ABDOMINAL HYSTERECTOMY  1984  . BACK SURGERY  1986   ruptured disc, Dr Hardin Negus [facility])  . CHOLECYSTECTOMY  1991  . CHOLECYSTECTOMY  1992  . COLONOSCOPY WITH PROPOFOL N/A 06/29/2016   Procedure: COLONOSCOPY WITH PROPOFOL;  Surgeon: Manya Silvas, MD;  Location: [facility] ENDOSCOPY;  Service: Endoscopy;  Laterality: N/A;  . ESOPHAGOGASTRODUODENOSCOPY (EGD) WITH PROPOFOL N/A 12/13/2017   Procedure: ESOPHAGOGASTRODUODENOSCOPY (EGD) WITH PROPOFOL;  Surgeon: Manya Silvas, MD;  Location: [facility] ENDOSCOPY;  Service: Endoscopy;  Laterality: N/A;  . TONSILLECTOMY      There were no vitals filed for this visit.   Subjective Assessment - 01/31/18 1614    Subjective  Back pain: 2/10 currently (pt sitting on a chair), 0/10 at best, 8/10 at worst for the past 2 months. R LE pain (along sciatic nerve): 1/10 currently (pt sitting on a chair), 0/10 at best,  8/10 at worst for  the past 2 months.     Pertinent History  Low back pain. Pt states having back problems for a while. Had surgery in 1985 for a ruptured disc. Went to the chiropractor about last year which helped but termprarily. Back has been feeling good recently. However mopping, vacuuming, making up her bed bothers her back. Can tolerate her back pain. Her doctors want her to try PT. Takes it easy until it eases up. Feels radiating symptoms R LE (along sciatic nerve).  Denies loss of bowel or bladder function. Denies LE paresthesia.   No falls within the last 6 months, no fear of falling.     Patient Stated Goals  To feel better,     Currently in Pain?  Yes    Pain Score  2     Pain Location  Back and R LE    Pain Orientation  Right;Posterior    Pain Descriptors / Indicators  Aching    Pain Type  Chronic pain    Pain Radiating Towards  R sciatic nerve    Pain Onset  More than a month ago    Pain Frequency  Occasional    Aggravating Factors   standing still such as washing dishes or singing at church > walking; lying on her back or sides (S/L bothers her more.  Tends to sleep on her back ), sitting up straight    Pain Relieving Factors  leaning forward, ice to her back         [facility] PT Assessment - 01/31/18 0001      Assessment   Medical Diagnosis  R sided low back pain with R sided sciatica, unspecified chronicity.     Referring Provider  [name], MD    Onset Date/Surgical Date  12/31/17 Date PT referral signed. Chronic condition    Prior Therapy  Temporary relief from chiropractic treatment.       Precautions   Precaution Comments  Anterolisthesis at L5-S1 per radiograph on 12/30/17      Restrictions   Other Position/Activity Restrictions  No known weight bearing restrictions      Balance Screen   Has the patient fallen in the past 6 months  No    Has the patient had a decrease in activity level because of a fear of falling?   No    Is the patient reluctant to leave their home because of a  fear of falling?   No      Prior Function   Vocation Requirements  PLOF: better able to peform standing tasks such as washing dishes, making her bed with less back pain      Observation/Other Assessments   Observations  (-) Slump test bilaterally. Supine and long sit position: bilateral LE equal     Focus on Therapeutic Outcomes (FOTO)   56    Modified [place]  24%      Posture/Postural Control   Posture Comments  protracted neck, bilaterally protracted shoulders, thoracic kyphosis, slight R trunk rotation, R hip in ER      AROM   Lumbar Flexion  WFL (no change in R low back and posterior hip symptoms    Lumbar Extension  -- not performed secondary to x-ray results    Lumbar - Right Side [facility] with R low back pain    Lumbar - Left Side [facility] with slight R low back pain    Lumbar - Right Rotation  WFL with slight R low back pain performed in sitting    Lumbar - Left Rotation  WFL performed in sitting      Strength   Right Hip Flexion  4-/5    Right Hip Extension  4/5 seated manually resisted hip extension    Right Hip ABduction  4+/5 seated clamshell isometrics    Left Hip Flexion  4/5    Left Hip Extension  4+/5 seated manually resisted hip extension    Left Hip ABduction  4+/5 seated clamshell isometrics    Right Knee Flexion  4/5    Right Knee Extension  5/5    Left Knee Flexion  4/5    Left Knee Extension  5/5      Palpation   Palpation comment  R lumbar paraspinal muscle tension      Ambulation/Gait   Gait Comments  Slight decreased L LE stance phase, bilateral pelvic drop during stance phase.                 Objective measurements completed on examination: See above findings.      anterolisthesis at L5-S1 per radiograph on 12/30/17  Pt states blood pressure and diabetes are controlled.       Therapeutic exercise   Seated transversus abdominis contractoin 10x2 with 5 second holds  Seated posterior pelvic tilts 10x  Seated bilateral  scapular retraction 10x with 5 second holds    Reviewed HEP. Pt demonstrated and verbalized understanding. Handout provided.   Check supine hip IR in 90/90 position and R piriformis next visit if appropriate   Improved exercise technique, movement at target joints, use of target muscles after mod verbal, visual, tactile cues.    Reviewed plan of care 5 weeks  2x/week for 1st 3 weeks and 1x/week for last 2 weeks secondary to commute.    Patient is a 77 year old female who came to physical therapy secondary to low back pain with R LE symptoms. She also presents with altered posture, bilateral hip weakness, R lumbar paraspinal muscle tension, reproduction of symptoms with lumbar AROM and difficulty performing functional tasks such as washing dishes or standing to sing at church as well as tolerating positions such as lying down on her back or sides to sleep. Patient will benefit from skilled physical therapy services to address the aforementioned deficits.       PT Education - 01/31/18 1802    Education Details  ther-ex, HEP, plan of care    Person(s) Educated  Patient    Methods  Explanation;Demonstration;Tactile cues;Verbal cues;Handout    Comprehension  Returned demonstration;Verbalized understanding       PT Short Term Goals - 01/31/18 1738      PT SHORT TERM GOAL #1   Title  Patient will be independent with her HEP to promote ability to perform standing tasks as well as tolerate supine and S/L positions with less back pain.     Time  3    Period  Weeks    Status  New    Target Date  02/03/18        PT Long Term Goals - 01/31/18 1740      PT LONG TERM GOAL #1   Title  Patient will have a decrease in back and R LE pain to 4/10 or less at worst  to promote ability to perform standing tasks as well as tolerate supine and S/L positions with less back pain.     Baseline  8/10 back and R LE pain at worst for the past 2 months (01/31/2018)    Time  5    Period  Weeks    Status   New    Target Date  03/10/18      PT LONG TERM GOAL #2   Title  Patient will improve her back FOTO score by at least 5 points as a demonstration of improved function.    Baseline  Back FOTO 56 (01/31/2018)    Time  5    Period  Weeks    Status  New    Target Date  03/10/18      PT LONG TERM GOAL #3   Title  Patient will improve bilateral glute med and max strength by at least 1/2 MMT grade to promote ability to perform standing tasks such as washing dishes and making her bed with less back pain.     Time  5    Period  Weeks    Status  New    Target Date  03/10/18      PT LONG TERM GOAL #4   Title  Patient will improve her Modified [place] Low Back Pain disability questionnaire score by at least 10% as a demonstration of improved function.     Baseline  24% (01/31/2018)    Time  5    Period  Weeks  Status  New    Target Date  03/10/18             Plan - 01/31/18 1718    Clinical Impression Statement  Patient is a 77 year old female who came to physical therapy secondary to low back pain with R LE symptoms. She also presents with altered posture, bilateral hip weakness, R lumbar paraspinal muscle tension, reproduction of symptoms with lumbar AROM and difficulty performing functional tasks such as washing dishes or standing to sing at church as well as tolerating positions such as lying down on her back or sides to sleep. Patient will benefit from skilled physical therapy services to address the aforementioned deficits.     History and Personal Factors relevant to plan of care:  Chronicity of condition, R LE symptoms, low back pain, difficulty standing to perform chores, difficulty tolerating positions such as supine or S/L due to back pain.     Clinical Presentation  Stable    Clinical Presentation due to:  Pt states back has been feeling good recently.     Clinical Decision Making  Low    Rehab Potential  Fair    Clinical Impairments Affecting Rehab Potential  (-) Chronicity  of condition, back and R LE pain, age; (+) motivated    PT Frequency  2x / week    PT Duration  Other (comment) 5 weeks    PT Treatment/Interventions  Therapeutic exercise;Neuromuscular re-education;Patient/family education;Manual techniques;Dry needling;Therapeutic activities;Ultrasound;Electrical Stimulation;Iontophoresis 4mg /ml Dexamethasone;Aquatic Therapy    PT Next Visit Plan  core strengthening, scapular strengthening/thoracic extension, glute strengthening; manual techniques, modalities PRN    Consulted and Agree with Plan of Care  Patient       Patient will benefit from skilled therapeutic intervention in order to improve the following deficits and impairments:  Pain, Postural dysfunction, Decreased strength, Improper body mechanics  Visit Diagnosis: Chronic right-sided low back pain with right-sided sciatica - Plan: PT plan of care cert/re-cert  Muscle weakness (generalized) - Plan: PT plan of care cert/re-cert     Problem List Patient Active Problem List   Diagnosis Date Noted  . Low back pain 12/30/2017  . Herpes genitalis 08/29/2017  . Dizziness 04/25/2017  . Rash 01/28/2017  . Aortic atherosclerosis ([place]) 12/19/2016  . Chest pain 09/27/2016  . History of colonic polyps 08/09/2016  . Abdominal pain 12/04/2015  . Neck nodule 03/01/2015  . Health care maintenance 11/03/2014  . BMI 35.0-35.9,adult 07/01/2014  . Right shoulder pain 06/28/2014  . Cough 02/26/2014  . Osteopenia 01/25/2013  . Abnormal mammogram 09/13/2012  . Hypertension 06/19/2012  . GERD (gastroesophageal reflux disease) 06/19/2012  . Diabetes mellitus ([place]) 06/07/2012  . Hypercholesterolemia 06/07/2012    Joneen Boers PT, DPT   01/31/2018, 6:11 PM  [facility] [address], [place], [address] Phone: [phone]   Fax:  [phone]  Name: Misty Jones MRN: [id number] Date of Birth: 1941-07-05

## 2018-02-02 ENCOUNTER — Ambulatory Visit: Payer: Medicare Other

## 2018-02-08 ENCOUNTER — Ambulatory Visit: Payer: Medicare Other

## 2018-02-10 ENCOUNTER — Ambulatory Visit: Payer: Medicare Other

## 2018-02-14 ENCOUNTER — Telehealth: Payer: Self-pay | Admitting: Internal Medicine

## 2018-02-14 NOTE — Telephone Encounter (Signed)
Patient stated she is not vomiting and still has an appetite. She is just having loose stools. Advised patient to stop metformin for the next few days and call me back with an update. Patient agreed.

## 2018-02-14 NOTE — Telephone Encounter (Signed)
Hold the metformin for the next couple of days.  See if symptoms resolve.  Call with update.  Also confirm she is eating and not vomiting, fever, etc.

## 2018-02-14 NOTE — Telephone Encounter (Signed)
Spoke to pt. Was increased to 2 pills of Metformin a day for her diabetes.  Pt cannot tolerate the medicine. She is having terrible diarrhea and stomach pains. She goes to the bathroom with diarrhea at least 9-10 times a day and is afraid to go anywhere. Pt also states the medicine is also causing her to have severe dry mouth which wakes her up at night.  Please advise and call pt at [phone]

## 2018-03-14 ENCOUNTER — Other Ambulatory Visit: Payer: Self-pay | Admitting: Internal Medicine

## 2018-03-14 DIAGNOSIS — Z1231 Encounter for screening mammogram for malignant neoplasm of breast: Secondary | ICD-10-CM

## 2018-03-29 IMAGING — US US THYROID
1 series · 13 of 25 positions shown · non-contrast
Comparison: 04/30/2015

CLINICAL DATA: Evaluate thyroid nodules.

EXAM:
THYROID ULTRASOUND
TECHNIQUE: Ultrasound examination of the thyroid gland and adjacent soft
tissues was performed.

[Series 1: us thyroid · 0.07mm/px · 13 of 50 slices shown]
[im 1/50]
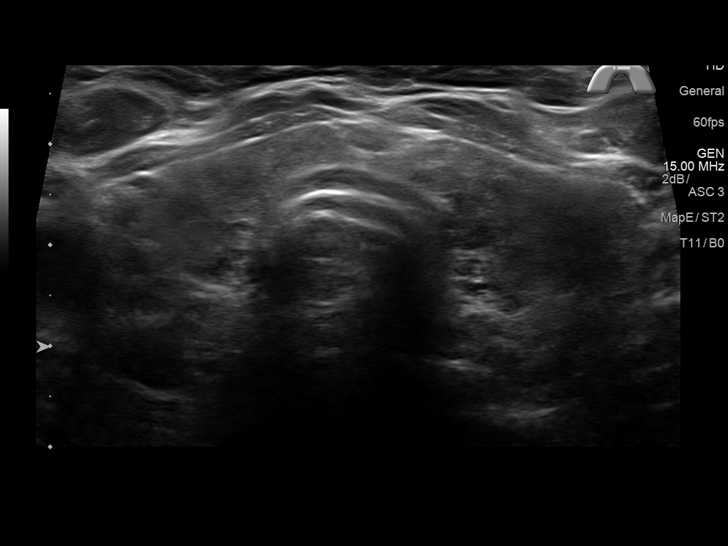
[im 5/50]
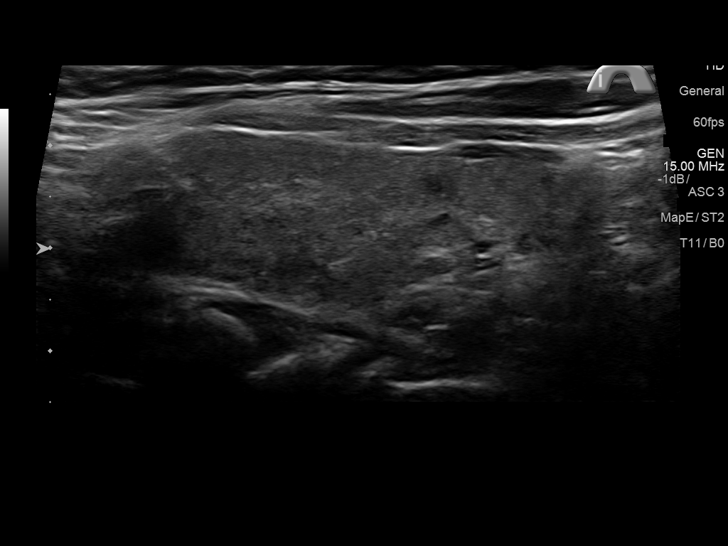
[im 9/50]
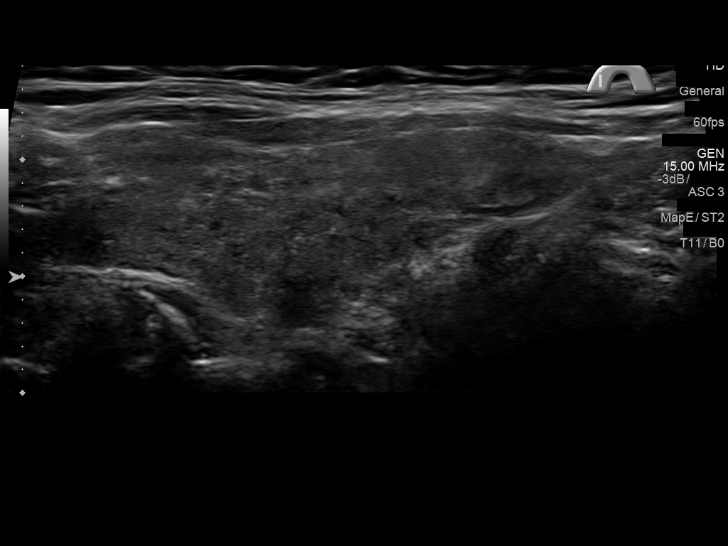
[im 13/50]
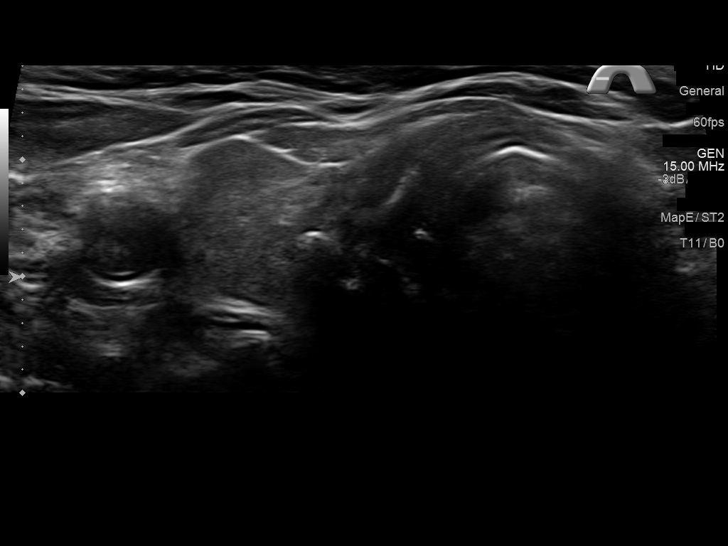
[im 17/50]
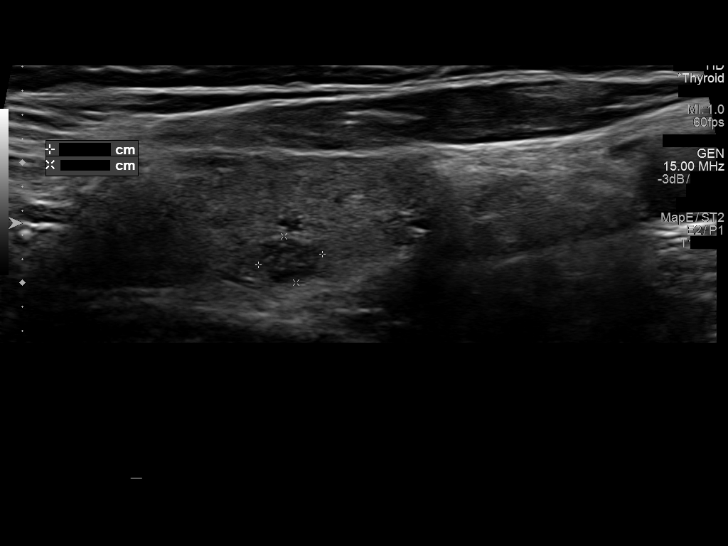
[im 21/50]
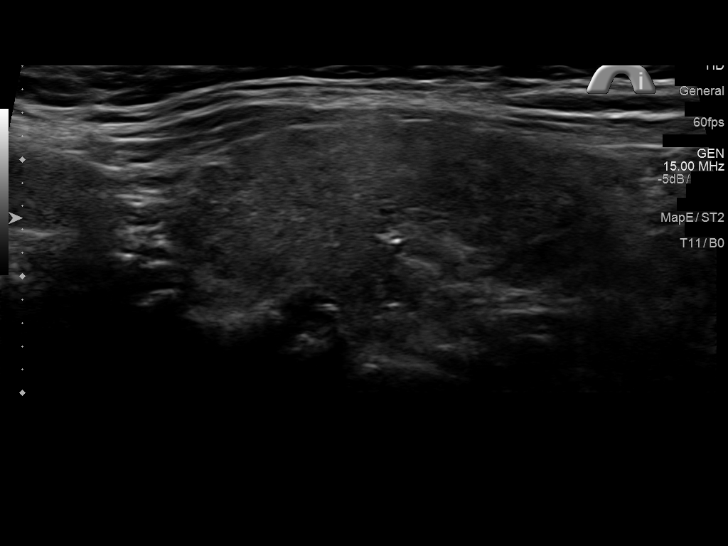
[im 25/50]
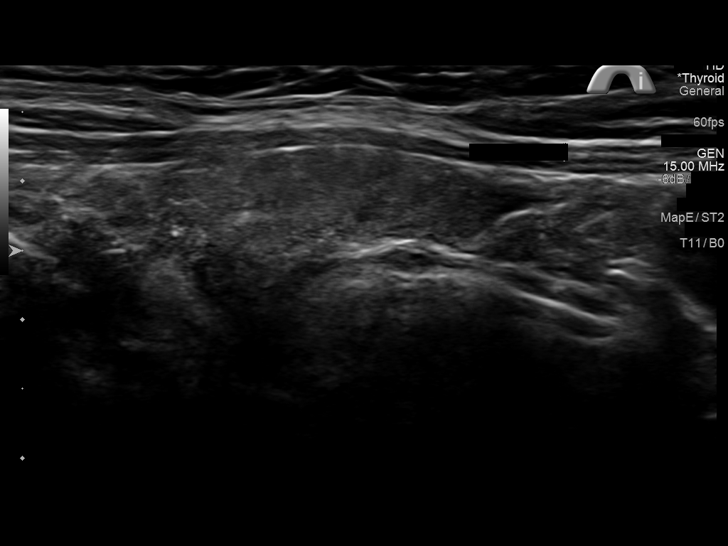
[im 29/50]
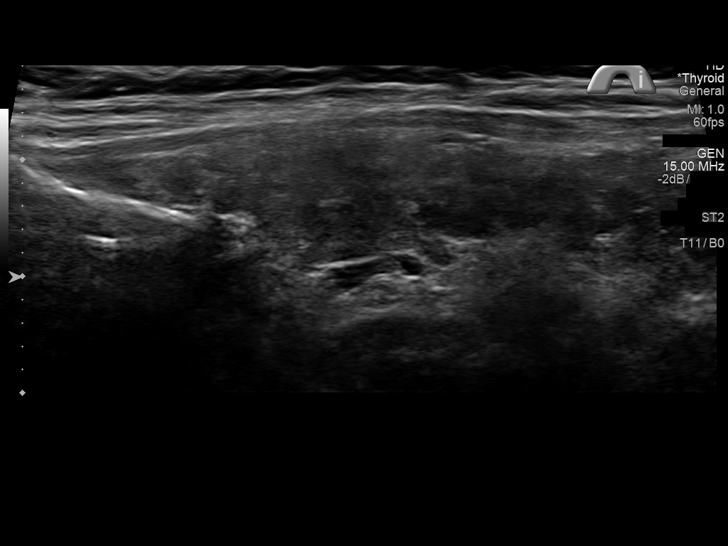
[im 33/50]
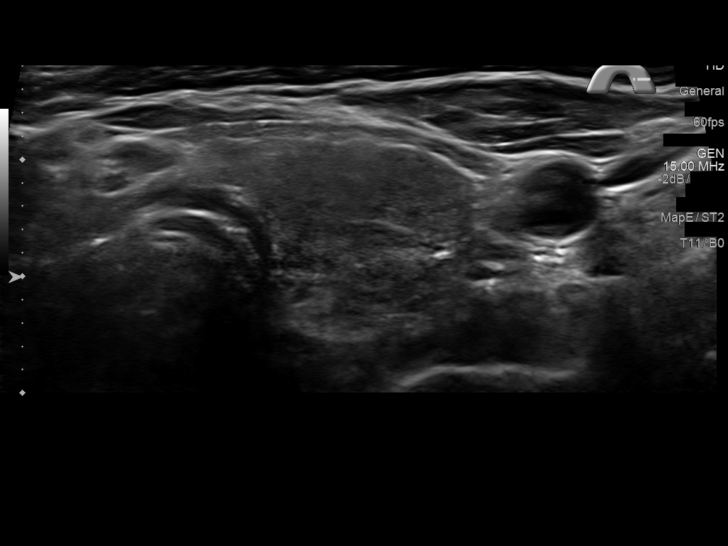
[im 37/50]
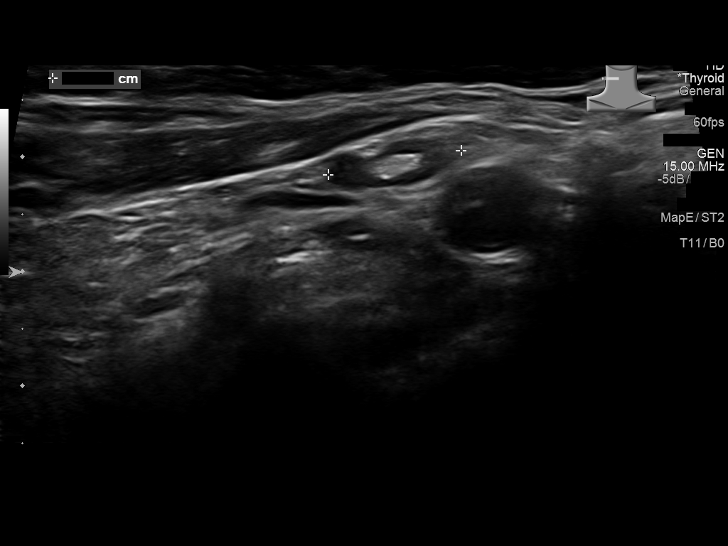
[im 41/50]
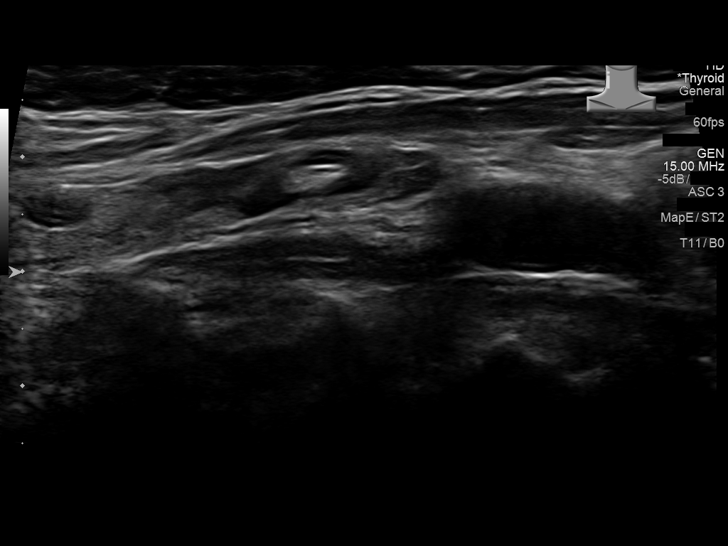
[im 45/50]
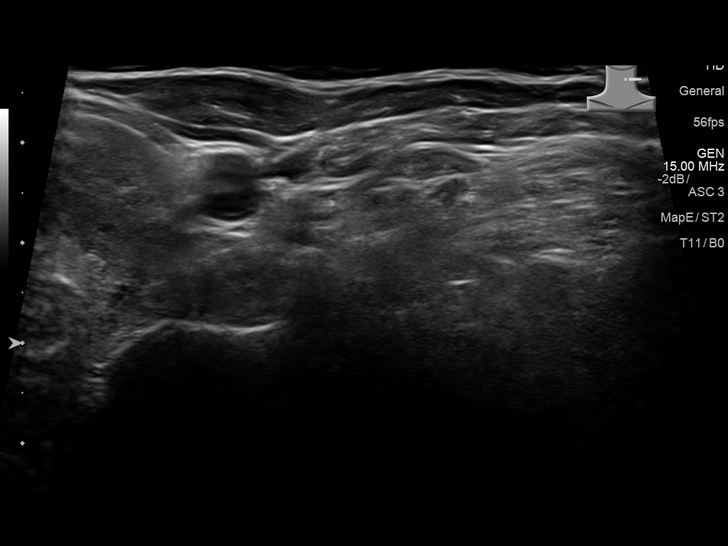
[im 50/50]
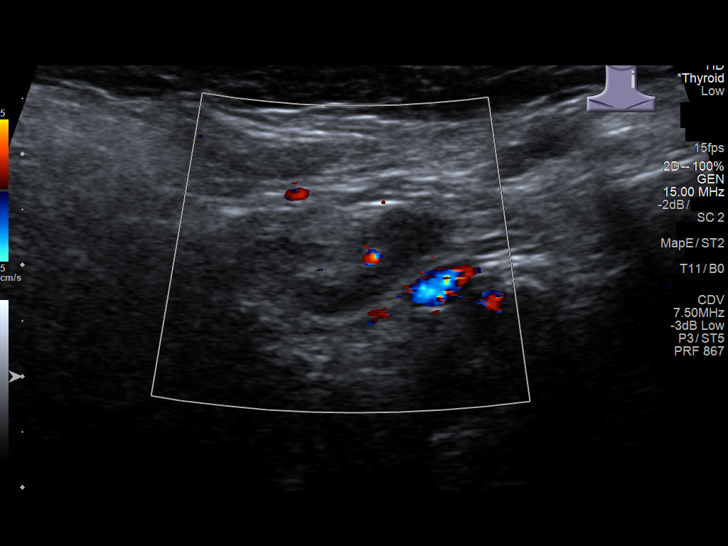

[13 of 25 positions shown; findings below may reference images not displayed]

FINDINGS: Right thyroid lobe

Measurements: 4.0 x 1.5 x 1.5 cm. Right thyroid tissue is mildly
heterogeneous. There is a subtle heterogeneous nodule in the
posterior mid right thyroid lobe. This nodule measures 0.5 x 0.4 x
0.4 cm, previously measured 0.5 x 0.4 x 0.4 cm. No other discrete
right thyroid nodules.

Left thyroid lobe

Measurements: 4.1 x 1.8 x 1.9 cm. Left thyroid tissue is diffusely
heterogeneous. There is not a discrete left thyroid nodule.
Previously described nodule along the inferior left thyroid lobe
appears to represent blood vessels on this examination.

Isthmus

Thickness: 0.6 cm.  No nodules visualized.

Lymphadenopathy

Prominent but normal-appearing lymph nodes on both sides of the
neck. Index left neck lymph node measures 2.3 x 0.8 x 1.8 cm.
Largest right lymph node measures 1.5 x 0.4 x 1.2 cm.
IMPRESSION: Heterogeneous thyroid tissue. Only one discrete right thyroid nodule
was identified and this nodule is stable measuring 0.5 cm in the
right thyroid lobe.

## 2018-04-04 DIAGNOSIS — H8110 Benign paroxysmal vertigo, unspecified ear: Secondary | ICD-10-CM | POA: Diagnosis not present

## 2018-04-11 DIAGNOSIS — H8110 Benign paroxysmal vertigo, unspecified ear: Secondary | ICD-10-CM | POA: Diagnosis not present

## 2018-04-18 DIAGNOSIS — H8110 Benign paroxysmal vertigo, unspecified ear: Secondary | ICD-10-CM | POA: Diagnosis not present

## 2018-05-02 DIAGNOSIS — R600 Localized edema: Secondary | ICD-10-CM | POA: Diagnosis not present

## 2018-05-02 DIAGNOSIS — E119 Type 2 diabetes mellitus without complications: Secondary | ICD-10-CM | POA: Diagnosis not present

## 2018-05-02 DIAGNOSIS — I10 Essential (primary) hypertension: Secondary | ICD-10-CM | POA: Diagnosis not present

## 2018-05-02 DIAGNOSIS — E782 Mixed hyperlipidemia: Secondary | ICD-10-CM | POA: Diagnosis not present

## 2018-05-03 DIAGNOSIS — H8110 Benign paroxysmal vertigo, unspecified ear: Secondary | ICD-10-CM | POA: Diagnosis not present

## 2018-05-04 ENCOUNTER — Other Ambulatory Visit (INDEPENDENT_AMBULATORY_CARE_PROVIDER_SITE_OTHER): Payer: Medicare Other

## 2018-05-04 DIAGNOSIS — E7800 Pure hypercholesterolemia, unspecified: Secondary | ICD-10-CM

## 2018-05-04 DIAGNOSIS — E119 Type 2 diabetes mellitus without complications: Secondary | ICD-10-CM

## 2018-05-04 DIAGNOSIS — I10 Essential (primary) hypertension: Secondary | ICD-10-CM

## 2018-05-04 LAB — CBC WITH DIFFERENTIAL/PLATELET
BASOS PCT: 0.8 % (ref 0.0–3.0)
Basophils Absolute: 0.1 10*3/uL (ref 0.0–0.1)
Eosinophils Absolute: 0.2 10*3/uL (ref 0.0–0.7)
Eosinophils Relative: 2.2 % (ref 0.0–5.0)
HEMATOCRIT: 39.7 % (ref 36.0–46.0)
HEMOGLOBIN: 13.1 g/dL (ref 12.0–15.0)
LYMPHS PCT: 41.4 % (ref 12.0–46.0)
Lymphs Abs: 3 10*3/uL (ref 0.7–4.0)
MCHC: 32.9 g/dL (ref 30.0–36.0)
MCV: 90.9 fl (ref 78.0–100.0)
MONOS PCT: 6.9 % (ref 3.0–12.0)
Monocytes Absolute: 0.5 10*3/uL (ref 0.1–1.0)
NEUTROS ABS: 3.6 10*3/uL (ref 1.4–7.7)
Neutrophils Relative %: 48.7 % (ref 43.0–77.0)
PLATELETS: 319 10*3/uL (ref 150.0–400.0)
RBC: 4.37 Mil/uL (ref 3.87–5.11)
RDW: 14 % (ref 11.5–15.5)
WBC: 7.3 10*3/uL (ref 4.0–10.5)

## 2018-05-04 LAB — BASIC METABOLIC PANEL
BUN: 17 mg/dL (ref 6–23)
CALCIUM: 9.4 mg/dL (ref 8.4–10.5)
CO2: 33 mEq/L — ABNORMAL HIGH (ref 19–32)
CREATININE: 0.89 mg/dL (ref 0.40–1.20)
Chloride: 101 mEq/L (ref 96–112)
GFR: 65.2 mL/min (ref >60.00)
GLUCOSE: 134 mg/dL — AB (ref 70–99)
Potassium: 4.5 mEq/L (ref 3.5–5.1)
SODIUM: 141 meq/L (ref 135–145)

## 2018-05-04 LAB — LIPID PANEL
CHOL/HDL RATIO: 4
Cholesterol: 202 mg/dL — ABNORMAL HIGH (ref 0–200)
HDL: 48.6 mg/dL (ref >39.00)
LDL Cholesterol: 122 mg/dL — ABNORMAL HIGH (ref 0–99)
NONHDL: 153
TRIGLYCERIDES: 153 mg/dL — AB (ref 0.0–149.0)
VLDL: 30.6 mg/dL (ref 0.0–40.0)

## 2018-05-04 LAB — HEMOGLOBIN A1C: Hgb A1c MFr Bld: 6.8 % — ABNORMAL HIGH (ref 4.6–6.5)

## 2018-05-04 LAB — HEPATIC FUNCTION PANEL
ALK PHOS: 53 U/L (ref 39–117)
ALT: 12 U/L (ref 0–35)
AST: 14 U/L (ref 0–37)
Albumin: 3.9 g/dL (ref 3.5–5.2)
BILIRUBIN DIRECT: 0 mg/dL (ref 0.0–0.3)
BILIRUBIN TOTAL: 0.3 mg/dL (ref 0.2–1.2)
TOTAL PROTEIN: 6.4 g/dL (ref 6.0–8.3)

## 2018-05-04 LAB — TSH: TSH: 3.93 u[IU]/mL (ref 0.35–4.50)

## 2018-05-09 ENCOUNTER — Ambulatory Visit (INDEPENDENT_AMBULATORY_CARE_PROVIDER_SITE_OTHER): Payer: Medicare Other | Admitting: Internal Medicine

## 2018-05-09 ENCOUNTER — Ambulatory Visit
Admission: RE | Admit: 2018-05-09 | Discharge: 2018-05-09 | Disposition: A | Discharge status: Home / Self Care | Payer: Medicare Other | Source: Ambulatory Visit | Attending: Internal Medicine | Admitting: Internal Medicine

## 2018-05-09 ENCOUNTER — Encounter: Payer: Self-pay | Admitting: Internal Medicine

## 2018-05-09 DIAGNOSIS — I700 Atherosclerosis of aorta: Secondary | ICD-10-CM

## 2018-05-09 DIAGNOSIS — I10 Essential (primary) hypertension: Secondary | ICD-10-CM | POA: Diagnosis not present

## 2018-05-09 DIAGNOSIS — Z23 Encounter for immunization: Secondary | ICD-10-CM | POA: Diagnosis not present

## 2018-05-09 DIAGNOSIS — E119 Type 2 diabetes mellitus without complications: Secondary | ICD-10-CM

## 2018-05-09 DIAGNOSIS — K219 Gastro-esophageal reflux disease without esophagitis: Secondary | ICD-10-CM | POA: Diagnosis not present

## 2018-05-09 DIAGNOSIS — Z6834 Body mass index (BMI) 34.0-34.9, adult: Secondary | ICD-10-CM | POA: Diagnosis not present

## 2018-05-09 DIAGNOSIS — L989 Disorder of the skin and subcutaneous tissue, unspecified: Secondary | ICD-10-CM | POA: Diagnosis not present

## 2018-05-09 DIAGNOSIS — E7800 Pure hypercholesterolemia, unspecified: Secondary | ICD-10-CM

## 2018-05-09 DIAGNOSIS — Z0000 Encounter for general adult medical examination without abnormal findings: Secondary | ICD-10-CM

## 2018-05-09 DIAGNOSIS — Z1231 Encounter for screening mammogram for malignant neoplasm of breast: Secondary | ICD-10-CM | POA: Diagnosis not present

## 2018-05-09 MED ORDER — METFORMIN HCL 500 MG PO TABS: 500.0000 mg | ORAL_TABLET | Freq: Two times a day (BID) | ORAL | 1 refills | Status: DC

## 2018-05-09 NOTE — Assessment & Plan Note (Signed)
Unable to take statin medication.  On zetia.  Follow.

## 2018-05-09 NOTE — Progress Notes (Signed)
Patient ID: Misty Jones, female   DOB: 06-15-1941, 77 y.o.   MRN: [id number]   Subjective:    Patient ID: Misty Jones, female    DOB: 08/17/1940, 77 y.o.   MRN: [id number]  HPI  Patient with past history of diabetes, GERD, hypercholesterolemia and hypertension.  She comes in today to follow up on these issues as well as for a complete physical exam.  She reports since the change in metformin to extended release, she has noticed increased loose stool and stomach cramps.  Early am - worse.  Discussed treatment options for her sugars.  Wants to go back to regular metformin.  Did not have as much trouble.  Plans to get more serious about her diet.  a1c on this check - improved.  No chest pain.  No sob.  No acid reflux.  No abdominal pain currently.     Past Medical History:  Diagnosis Date  . B12 deficiency   . Chicken pox   . Diabetes mellitus ([place])   . GERD (gastroesophageal reflux disease)   . History of diverticulitis of colon   . Hypercholesterolemia   . Hypertension   . Nasal drainage    Past Surgical History:  Procedure Laterality Date  . ABDOMINAL HYSTERECTOMY  1984  . BACK SURGERY  1986   ruptured disc, Dr Hardin Negus [facility])  . CHOLECYSTECTOMY  1991  . CHOLECYSTECTOMY  1992  . COLONOSCOPY WITH PROPOFOL N/A 06/29/2016   Procedure: COLONOSCOPY WITH PROPOFOL;  Surgeon: Manya Silvas, MD;  Location: [facility] ENDOSCOPY;  Service: Endoscopy;  Laterality: N/A;  . ESOPHAGOGASTRODUODENOSCOPY (EGD) WITH PROPOFOL N/A 12/13/2017   Procedure: ESOPHAGOGASTRODUODENOSCOPY (EGD) WITH PROPOFOL;  Surgeon: Manya Silvas, MD;  Location: [facility] ENDOSCOPY;  Service: Endoscopy;  Laterality: N/A;  . TONSILLECTOMY     Family History  Problem Relation Age of Onset  . Hypertension Mother   . Cancer Mother        Mouth cancer  . Heart disease Father        myocardial infarction  . Raynaud syndrome Sister   . Asthma Sister   . Congenital heart disease Sister   . Thyroid disease Sister   .  Thyroid cancer Daughter   . Breast cancer Neg Hx   . Colon cancer Neg Hx    Social History   Socioeconomic History  . Marital status: Divorced    Spouse name: Not on file  . Number of children: 2  . Years of education: Not on file  . Highest education level: Not on file  Occupational History  . Occupation: retired  Scientific laboratory technician  . Financial resource strain: Not hard at all  . Food insecurity:    Worry: Never true    Inability: Never true  . Transportation needs:    Medical: No    Non-medical: No  Tobacco Use  . Smoking status: Former Smoker    Packs/day: 0.50    Years: 20.00    Pack years: 10.00    Last attempt to quit: 07/26/1984    Years since quitting: 33.8  . Smokeless tobacco: Never Used  . Tobacco comment: quit smoking years ago  Substance and Sexual Activity  . Alcohol use: No    Alcohol/week: 0.0 standard drinks  . Drug use: No  . Sexual activity: Never  Lifestyle  . Physical activity:    Days per week: Not on file    Minutes per session: Not on file  . Stress: Not on file  Relationships  .  Social connections:    Talks on phone: Not on file    Gets together: Not on file    Attends religious service: Not on file    Active member of club or organization: Not on file    Attends meetings of clubs or organizations: Not on file    Relationship status: Not on file  Other Topics Concern  . Not on file  Social History Narrative  . Not on file    Outpatient Encounter Medications as of 05/09/2018  Medication Sig  . aspirin 81 MG tablet Take 81 mg by mouth daily.  Marland Kitchen BAYER CONTOUR TEST test strip USE ONE STRIP TO CHECK GLUCOSE ONCE DAILY  . benazepril (LOTENSIN) 40 MG tablet TAKE 1 TABLET BY MOUTH ONCE DAILY  . Calcium Carbonate-Vitamin D (CALCIUM 600+D) 600-400 MG-UNIT per tablet Take 1 tablet by mouth 2 (two) times daily.  . Cholecalciferol (VITAMIN D) 2000 UNITS tablet Take 1,000 Units by mouth daily.   . cyanocobalamin (,VITAMIN B-12,) 1000 MCG/ML  injection INJECT 1 ML (1,000 MCG TOTAL) INTO THE MUSCLE EVERY 30 (THIRTY) DAYS.  Marland Kitchen ezetimibe (ZETIA) 10 MG tablet Take 1 tablet (10 mg total) by mouth daily.  . fish oil-omega-3 fatty acids 1000 MG capsule Take 1 g by mouth 2 (two) times daily.  Marland Kitchen glucose blood (BAYER CONTOUR TEST) test strip 1 each by Other route daily. Use as instructed  . hydrochlorothiazide (HYDRODIURIL) 25 MG tablet TAKE ONE-HALF TABLET BY MOUTH ONCE DAILY  . Lancets MISC Check sugars once a day Dx: E11.9 (Contour Next)  . Melatonin 10 MG TABS Take by mouth.  . niacin 100 MG tablet Take 100 mg by mouth at bedtime.  Marland Kitchen omeprazole (PRILOSEC) 20 MG capsule Take 20 mg by mouth daily.  . [DISCONTINUED] metFORMIN (GLUCOPHAGE XR) 500 MG 24 hr tablet Take 1 tablet (500 mg total) by mouth 2 (two) times daily.  . metFORMIN (GLUCOPHAGE) 500 MG tablet Take 1 tablet (500 mg total) by mouth 2 (two) times daily with a meal.   No facility-administered encounter medications on file as of 05/09/2018.     Review of Systems  Constitutional: Negative for appetite change and unexpected weight change.  HENT: Negative for congestion and sinus pressure.   Eyes: Negative for pain and visual disturbance.  Respiratory: Negative for cough, chest tightness and shortness of breath.   Cardiovascular: Negative for chest pain, palpitations and leg swelling.  Gastrointestinal: Negative for nausea and vomiting.       Loose stool and abdominal cramping as outlined.    Genitourinary: Negative for difficulty urinating and frequency.  Musculoskeletal: Negative for back pain and joint swelling.  Skin: Negative for color change and rash.  Neurological: Negative for dizziness, light-headedness and headaches.  Hematological: Negative for adenopathy. Does not bruise/bleed easily.  Psychiatric/Behavioral: Negative for agitation and dysphoric mood.       Objective:    Physical Exam  Constitutional: She is oriented to person, place, and time. She appears  well-developed and well-nourished. No distress.  HENT:  Nose: Nose normal.  Mouth/Throat: Oropharynx is clear and moist.  Eyes: Right eye exhibits no discharge. Left eye exhibits no discharge. No scleral icterus.  Neck: Neck supple. No thyromegaly present.  Cardiovascular: Normal rate and regular rhythm.  Pulmonary/Chest: Breath sounds normal. No accessory muscle usage. No tachypnea. No respiratory distress. She has no decreased breath sounds. She has no wheezes. She has no rhonchi. Right breast exhibits no inverted nipple, no mass, no nipple discharge and no tenderness (  no axillary adenopathy). Left breast exhibits no inverted nipple, no mass, no nipple discharge and no tenderness (no axilarry adenopathy).  Abdominal: Soft. Bowel sounds are normal. There is no tenderness.  Musculoskeletal: She exhibits no edema or tenderness.  Lymphadenopathy:    She has no cervical adenopathy.  Neurological: She is alert and oriented to person, place, and time.  Skin: No rash noted. No erythema.  Psychiatric: She has a normal mood and affect. Her behavior is normal.    BP 122/86   Pulse 86   Temp 98.2 F (36.8 C) (Oral)   Ht 5' 4"  (1.626 m)   Wt 200 lb 3.2 oz (90.8 kg)   SpO2 94%   BMI 34.36 kg/m  Wt Readings from Last 3 Encounters:  05/09/18 200 lb 3.2 oz (90.8 kg)  12/30/17 205 lb 3.2 oz (93.1 kg)  12/24/17 205 lb 6.4 oz (93.2 kg)     Lab Results  Component Value Date   WBC 7.3 05/04/2018   HGB 13.1 05/04/2018   HCT 39.7 05/04/2018   PLT 319.0 05/04/2018   GLUCOSE 134 (H) 05/04/2018   CHOL 202 (H) 05/04/2018   TRIG 153.0 (H) 05/04/2018   HDL 48.60 05/04/2018   LDLDIRECT 171.2 05/26/2013   LDLCALC 122 (H) 05/04/2018   ALT 12 05/04/2018   AST 14 05/04/2018   NA 141 05/04/2018   K 4.5 05/04/2018   CL 101 05/04/2018   CREATININE 0.89 05/04/2018   BUN 17 05/04/2018   CO2 33 (H) 05/04/2018   TSH 3.93 05/04/2018   INR 0.9 04/03/2012   HGBA1C 6.8 (H) 05/04/2018   MICROALBUR 0.7  08/26/2017    Mm 3d Screen Breast Bilateral  Result Date: 05/09/2018 CLINICAL DATA:  Screening. EXAM: DIGITAL SCREENING BILATERAL MAMMOGRAM WITH TOMO AND CAD COMPARISON:  Previous exam(s). ACR Breast Density Category b: There are scattered areas of fibroglandular density. FINDINGS: There are no findings suspicious for malignancy. Images were processed with CAD. IMPRESSION: No mammographic evidence of malignancy. A result letter of this screening mammogram will be mailed directly to the patient. RECOMMENDATION: Screening mammogram in one year. (Code:SM-B-01Y) BI-RADS CATEGORY  1: Negative. Electronically Signed   By: Franki Cabot M.D.   On: 05/09/2018 09:07       Assessment & Plan:   Problem List Items Addressed This Visit    Aortic atherosclerosis ([employer])    Unable to take statin medication.  On zetia.  Follow.       BMI 34.0-34.9,adult    Discussed diet and exercise.  Follow.        Diabetes mellitus ([place])    a1c just checked 6.8.  Discussed low carb diet and exercise.  With increased loose stool after changing to extended release metformin.  She relates her stool change to the change in medication.  Wants to change back to regular metformin.  Follow sugars.  Will start with one per day.  Follow sugars and increase as tolerated.  May need to change medication if persistent problems.  Follow met b and a1c.        Relevant Medications   metFORMIN (GLUCOPHAGE) 500 MG tablet   Other Relevant Orders   Hemoglobin P8K   Basic metabolic panel   Microalbumin / creatinine urine ratio   GERD (gastroesophageal reflux disease)    Controlled on current regimen.  Follow.        Health care maintenance    Physical today 05/09/18.  Mammogram 05/09/18 - Birads I.  Colonoscopy 06/29/16 - tubular adenoma  x 2 and hyperplastic polyp x 2.        Hypercholesterolemia    Has tried multiple statin medications.  Intolerant.  Low cholesterol diet and exercise.  On zetia.  Follow.        Relevant Orders    Hepatic function panel   Lipid panel   Hypertension    Blood pressure under good control.  Continue same medication regimen.  Follow pressures.  Follow metabolic panel.         Other Visit Diagnoses    Encounter for immunization       Relevant Orders   Flu vaccine HIGH DOSE PF (Completed)   Skin lesion       Anterior chest lesion and wants skin check.  Refer to dermatology.     Relevant Orders   Ambulatory referral to Dermatology       [name], MD

## 2018-05-09 NOTE — Assessment & Plan Note (Signed)
a1c just checked 6.8.  Discussed low carb diet and exercise.  With increased loose stool after changing to extended release metformin.  She relates her stool change to the change in medication.  Wants to change back to regular metformin.  Follow sugars.  Will start with one per day.  Follow sugars and increase as tolerated.  May need to change medication if persistent problems.  Follow met b and a1c.

## 2018-05-09 NOTE — Assessment & Plan Note (Signed)
Discussed diet and exercise.  Follow.  

## 2018-05-09 NOTE — Assessment & Plan Note (Signed)
Blood pressure under good control.  Continue same medication regimen.  Follow pressures.  Follow metabolic panel.   

## 2018-05-09 NOTE — Assessment & Plan Note (Signed)
Physical today 05/09/18.  Mammogram 05/09/18 - Birads I.  Colonoscopy 06/29/16 - tubular adenoma x 2 and hyperplastic polyp x 2.

## 2018-05-09 NOTE — Progress Notes (Signed)
Pre visit review using our clinic review tool, if applicable. No additional management support is needed unless otherwise documented below in the visit note. 

## 2018-05-09 NOTE — Assessment & Plan Note (Signed)
Has tried multiple statin medications.  Intolerant.  Low cholesterol diet and exercise.  On zetia.  Follow.

## 2018-05-09 NOTE — Assessment & Plan Note (Signed)
Controlled on current regimen.  Follow.  

## 2018-05-31 DIAGNOSIS — E119 Type 2 diabetes mellitus without complications: Secondary | ICD-10-CM | POA: Diagnosis not present

## 2018-05-31 LAB — HM DIABETES EYE EXAM

## 2018-06-02 ENCOUNTER — Other Ambulatory Visit: Payer: Self-pay | Admitting: Internal Medicine

## 2018-06-02 NOTE — Telephone Encounter (Signed)
rx ok'd for B12 (63ml with one refill)

## 2018-06-02 NOTE — Telephone Encounter (Signed)
Last OV 05/09/2018   Last refilled 12/17/2016 disp 10 ml with 1 refill   Sent to PCP for approval

## 2018-06-06 ENCOUNTER — Telehealth: Payer: Self-pay | Admitting: Internal Medicine

## 2018-06-06 NOTE — Telephone Encounter (Signed)
Copied from [place] [id number]. Topic: Quick Communication - See Telephone Encounter >> Jun 06, 2018 12:14 PM Antonieta Iba C wrote: CRM for notification. See Telephone encounter for: 06/06/18.  Pt called in to follow up on BAYER CONTOUR TEST test strip. Pt says that she was told by the pharmacy that her insurance [facility]) is requiring more information from the provider in order to fill Rx. Pt says that pharmacy informed her that they have faxed over documentation. Pt would like further assistance with this.    ALSO, pt says that she and provider discussed her having shingles shot. Pt says that she would have to cover this because Medicare doesn't pay for it. Is this okay to schedule.   Please assist.    CB: [phone]

## 2018-06-07 NOTE — Telephone Encounter (Signed)
Rx for strips needed dx code. Advised patient she should contact pharmacy for shingrix vaccine because we cannot administer in office

## 2018-06-08 ENCOUNTER — Other Ambulatory Visit: Payer: Self-pay

## 2018-06-08 ENCOUNTER — Other Ambulatory Visit: Payer: Self-pay | Admitting: Internal Medicine

## 2018-06-13 ENCOUNTER — Other Ambulatory Visit: Payer: Self-pay

## 2018-06-13 MED ORDER — GLUCOSE BLOOD VI STRP
ORAL_STRIP | 5 refills | Status: DC
Start: 2018-06-13 — End: 2019-04-26

## 2018-06-27 DIAGNOSIS — H353124 Nonexudative age-related macular degeneration, left eye, advanced atrophic with subfoveal involvement: Secondary | ICD-10-CM | POA: Diagnosis not present

## 2018-06-27 DIAGNOSIS — H43813 Vitreous degeneration, bilateral: Secondary | ICD-10-CM | POA: Diagnosis not present

## 2018-06-27 DIAGNOSIS — H353113 Nonexudative age-related macular degeneration, right eye, advanced atrophic without subfoveal involvement: Secondary | ICD-10-CM | POA: Diagnosis not present

## 2018-07-04 DIAGNOSIS — L578 Other skin changes due to chronic exposure to nonionizing radiation: Secondary | ICD-10-CM | POA: Diagnosis not present

## 2018-07-04 DIAGNOSIS — L812 Freckles: Secondary | ICD-10-CM | POA: Diagnosis not present

## 2018-07-04 DIAGNOSIS — D485 Neoplasm of uncertain behavior of skin: Secondary | ICD-10-CM | POA: Diagnosis not present

## 2018-07-04 DIAGNOSIS — D1800 Hemangioma unspecified site: Secondary | ICD-10-CM | POA: Diagnosis not present

## 2018-07-04 DIAGNOSIS — L918 Other hypertrophic disorders of the skin: Secondary | ICD-10-CM | POA: Diagnosis not present

## 2018-07-04 DIAGNOSIS — L821 Other seborrheic keratosis: Secondary | ICD-10-CM | POA: Diagnosis not present

## 2018-07-04 DIAGNOSIS — L820 Inflamed seborrheic keratosis: Secondary | ICD-10-CM | POA: Diagnosis not present

## 2018-07-04 DIAGNOSIS — D225 Melanocytic nevi of trunk: Secondary | ICD-10-CM | POA: Diagnosis not present

## 2018-07-05 DIAGNOSIS — Z86018 Personal history of other benign neoplasm: Secondary | ICD-10-CM

## 2018-07-05 HISTORY — DX: Personal history of other benign neoplasm: Z86.018

## 2018-07-28 NOTE — Telephone Encounter (Signed)
Error

## 2018-09-06 ENCOUNTER — Other Ambulatory Visit (INDEPENDENT_AMBULATORY_CARE_PROVIDER_SITE_OTHER): Payer: Medicare Other

## 2018-09-06 DIAGNOSIS — E7800 Pure hypercholesterolemia, unspecified: Secondary | ICD-10-CM | POA: Diagnosis not present

## 2018-09-06 DIAGNOSIS — E119 Type 2 diabetes mellitus without complications: Secondary | ICD-10-CM | POA: Diagnosis not present

## 2018-09-06 LAB — HEPATIC FUNCTION PANEL
ALBUMIN: 3.8 g/dL (ref 3.5–5.2)
ALT: 11 U/L (ref 0–35)
AST: 14 U/L (ref 0–37)
Alkaline Phosphatase: 57 U/L (ref 39–117)
Bilirubin, Direct: 0.1 mg/dL (ref 0.0–0.3)
TOTAL PROTEIN: 6.1 g/dL (ref 6.0–8.3)
Total Bilirubin: 0.3 mg/dL (ref 0.2–1.2)

## 2018-09-06 LAB — MICROALBUMIN / CREATININE URINE RATIO
Creatinine,U: 120.8 mg/dL
MICROALB UR: 1.2 mg/dL (ref 0.0–1.9)
MICROALB/CREAT RATIO: 1 mg/g (ref 0.0–30.0)

## 2018-09-06 LAB — LIPID PANEL
CHOL/HDL RATIO: 5
Cholesterol: 253 mg/dL — ABNORMAL HIGH (ref 0–200)
HDL: 48.8 mg/dL (ref >39.00)
LDL Cholesterol: 170 mg/dL — ABNORMAL HIGH (ref 0–99)
NonHDL: 204.26
TRIGLYCERIDES: 171 mg/dL — AB (ref 0.0–149.0)
VLDL: 34.2 mg/dL (ref 0.0–40.0)

## 2018-09-06 LAB — BASIC METABOLIC PANEL
BUN: 19 mg/dL (ref 6–23)
CALCIUM: 9 mg/dL (ref 8.4–10.5)
CO2: 32 meq/L (ref 19–32)
Chloride: 101 mEq/L (ref 96–112)
Creatinine, Ser: 0.8 mg/dL (ref 0.40–1.20)
GFR: 69.31 mL/min (ref >60.00)
GLUCOSE: 128 mg/dL — AB (ref 70–99)
POTASSIUM: 4.5 meq/L (ref 3.5–5.1)
SODIUM: 140 meq/L (ref 135–145)

## 2018-09-06 LAB — HEMOGLOBIN A1C: Hgb A1c MFr Bld: 6.6 % — ABNORMAL HIGH (ref 4.6–6.5)

## 2018-09-08 ENCOUNTER — Ambulatory Visit (INDEPENDENT_AMBULATORY_CARE_PROVIDER_SITE_OTHER): Payer: Medicare Other | Admitting: Internal Medicine

## 2018-09-08 ENCOUNTER — Encounter: Payer: Self-pay | Admitting: Internal Medicine

## 2018-09-08 DIAGNOSIS — E7800 Pure hypercholesterolemia, unspecified: Secondary | ICD-10-CM | POA: Diagnosis not present

## 2018-09-08 DIAGNOSIS — K219 Gastro-esophageal reflux disease without esophagitis: Secondary | ICD-10-CM | POA: Diagnosis not present

## 2018-09-08 DIAGNOSIS — E119 Type 2 diabetes mellitus without complications: Secondary | ICD-10-CM

## 2018-09-08 DIAGNOSIS — Z6834 Body mass index (BMI) 34.0-34.9, adult: Secondary | ICD-10-CM

## 2018-09-08 DIAGNOSIS — I700 Atherosclerosis of aorta: Secondary | ICD-10-CM

## 2018-09-08 DIAGNOSIS — I10 Essential (primary) hypertension: Secondary | ICD-10-CM

## 2018-09-08 DIAGNOSIS — R109 Unspecified abdominal pain: Secondary | ICD-10-CM

## 2018-09-08 LAB — HM DIABETES FOOT EXAM

## 2018-09-08 MED ORDER — EZETIMIBE 10 MG PO TABS: 10.0000 mg | ORAL_TABLET | Freq: Every day | ORAL | 3 refills | Status: DC

## 2018-09-08 MED ORDER — MAGNESIUM OXIDE 400 MG PO TABS: 400.0000 mg | ORAL_TABLET | Freq: Every day | ORAL | 2 refills | Status: DC

## 2018-09-08 NOTE — Assessment & Plan Note (Addendum)
Has tried multiple statin medications.  Intolerant.  Elevated cholesterol.  Discussed zetia.

## 2018-09-08 NOTE — Progress Notes (Signed)
Patient ID: Misty Jones, female   DOB: 06-21-1941, 78 y.o.   MRN: [id number]   Subjective:    Patient ID: Misty Jones, female    DOB: 07-24-1941, 78 y.o.   MRN: [id number]  HPI  Patient here for a scheduled follow up.  She reports having stomach ache two weeks ago.  Lasted about 3-4 days.  Some constipation.  Pain more localized to LLQ.  Occasionally will involve whole stomach.  No nausea or vomiting.  Eating.  No chest pain.  No sob.  No acid reflux.  Discussed labs.  a1c improved.  Cholesterol increased.  States not taking zetia.  Is agreeable to take.  Handling stress.     Past Medical History:  Diagnosis Date  . B12 deficiency   . Chicken pox   . Diabetes mellitus ([place])   . GERD (gastroesophageal reflux disease)   . History of diverticulitis of colon   . Hypercholesterolemia   . Hypertension   . Nasal drainage    Past Surgical History:  Procedure Laterality Date  . ABDOMINAL HYSTERECTOMY  1984  . BACK SURGERY  1986   ruptured disc, Dr Hardin Negus [facility])  . CHOLECYSTECTOMY  1991  . CHOLECYSTECTOMY  1992  . COLONOSCOPY WITH PROPOFOL N/A 06/29/2016   Procedure: COLONOSCOPY WITH PROPOFOL;  Surgeon: Manya Silvas, MD;  Location: [facility] ENDOSCOPY;  Service: Endoscopy;  Laterality: N/A;  . ESOPHAGOGASTRODUODENOSCOPY (EGD) WITH PROPOFOL N/A 12/13/2017   Procedure: ESOPHAGOGASTRODUODENOSCOPY (EGD) WITH PROPOFOL;  Surgeon: Manya Silvas, MD;  Location: [facility] ENDOSCOPY;  Service: Endoscopy;  Laterality: N/A;  . TONSILLECTOMY     Family History  Problem Relation Age of Onset  . Hypertension Mother   . Cancer Mother        Mouth cancer  . Heart disease Father        myocardial infarction  . Raynaud syndrome Sister   . Asthma Sister   . Congenital heart disease Sister   . Thyroid disease Sister   . Thyroid cancer Daughter   . Breast cancer Neg Hx   . Colon cancer Neg Hx    Social History   Socioeconomic History  . Marital status: Divorced    Spouse name: Not on  file  . Number of children: 2  . Years of education: Not on file  . Highest education level: Not on file  Occupational History  . Occupation: retired  Scientific laboratory technician  . Financial resource strain: Not hard at all  . Food insecurity:    Worry: Never true    Inability: Never true  . Transportation needs:    Medical: No    Non-medical: No  Tobacco Use  . Smoking status: Former Smoker    Packs/day: 0.50    Years: 20.00    Pack years: 10.00    Last attempt to quit: 07/26/1984    Years since quitting: 34.1  . Smokeless tobacco: Never Used  . Tobacco comment: quit smoking years ago  Substance and Sexual Activity  . Alcohol use: No    Alcohol/week: 0.0 standard drinks  . Drug use: No  . Sexual activity: Never  Lifestyle  . Physical activity:    Days per week: Not on file    Minutes per session: Not on file  . Stress: Not on file  Relationships  . Social connections:    Talks on phone: Not on file    Gets together: Not on file    Attends religious service: Not on file  Active member of club or organization: Not on file    Attends meetings of clubs or organizations: Not on file    Relationship status: Not on file  Other Topics Concern  . Not on file  Social History Narrative  . Not on file    Outpatient Encounter Medications as of 09/08/2018  Medication Sig  . aspirin 81 MG tablet Take 81 mg by mouth daily.  Marland Kitchen BAYER CONTOUR TEST test strip USE ONE STRIP TO CHECK GLUCOSE ONCE DAILY  . benazepril (LOTENSIN) 40 MG tablet TAKE 1 TABLET BY MOUTH ONCE DAILY  . Calcium Carbonate-Vitamin D (CALCIUM 600+D) 600-400 MG-UNIT per tablet Take 1 tablet by mouth 2 (two) times daily.  . Cholecalciferol (VITAMIN D) 2000 UNITS tablet Take 1,000 Units by mouth daily.   . cyanocobalamin (,VITAMIN B-12,) 1000 MCG/ML injection INJECT 1 ML (CC) INTRAMUSCULARLY ONCE EVERY MONTH  . ezetimibe (ZETIA) 10 MG tablet Take 1 tablet (10 mg total) by mouth daily.  Marland Kitchen glucose blood (CONTOUR TEST) test strip  USE 1 STRIP TO CHECK GLUCOSE ONCE DAILY. DX 11.9.  Marland Kitchen hydrochlorothiazide (HYDRODIURIL) 25 MG tablet TAKE ONE-HALF TABLET BY MOUTH ONCE DAILY  . Lancets MISC Check sugars once a day Dx: E11.9 (Contour Next)  . Melatonin 10 MG TABS Take by mouth.  . metFORMIN (GLUCOPHAGE) 500 MG tablet Take 1 tablet (500 mg total) by mouth 2 (two) times daily with a meal.  . Multiple Vitamin (MULTIVITAMIN WITH MINERALS) TABS tablet Take 2 tablets by mouth daily.  . niacin 100 MG tablet Take 100 mg by mouth at bedtime.  Marland Kitchen omeprazole (PRILOSEC) 20 MG capsule Take 20 mg by mouth daily.  . [DISCONTINUED] ezetimibe (ZETIA) 10 MG tablet Take 1 tablet (10 mg total) by mouth daily.  . magnesium oxide (MAG-OX) 400 MG tablet Take 1 tablet (400 mg total) by mouth daily.  . [DISCONTINUED] fish oil-omega-3 fatty acids 1000 MG capsule Take 1 g by mouth 2 (two) times daily.   No facility-administered encounter medications on file as of 09/08/2018.     Review of Systems  Constitutional: Negative for appetite change and unexpected weight change.  HENT: Negative for congestion and sinus pressure.   Respiratory: Negative for cough, chest tightness and shortness of breath.   Cardiovascular: Negative for chest pain, palpitations and leg swelling.  Gastrointestinal:       Previous abdominal discomfort as outlined.  Eating.  Some constipation.  No nausea, vomiting or diarrhea.    Genitourinary: Negative for difficulty urinating and dysuria.  Musculoskeletal: Negative for joint swelling and myalgias.  Skin: Negative for color change and rash.  Neurological: Negative for dizziness, light-headedness and headaches.  Psychiatric/Behavioral: Negative for agitation and dysphoric mood.       Objective:    Physical Exam Constitutional:      General: She is not in acute distress.    Appearance: Normal appearance.  HENT:     Nose: Nose normal. No congestion.     Mouth/Throat:     Pharynx: No oropharyngeal exudate or posterior  oropharyngeal erythema.  Neck:     Musculoskeletal: Neck supple. No muscular tenderness.     Thyroid: No thyromegaly.  Cardiovascular:     Rate and Rhythm: Normal rate and regular rhythm.  Pulmonary:     Effort: No respiratory distress.     Breath sounds: Normal breath sounds. No wheezing.  Abdominal:     General: Bowel sounds are normal.     Palpations: Abdomen is soft.  Tenderness: There is no abdominal tenderness.  Musculoskeletal:        General: No swelling or tenderness.     Comments: Feet:  No lesions.  Intact to pin prick and light touch.  DP pulses palpable and equal bilaterally.    Lymphadenopathy:     Cervical: No cervical adenopathy.  Skin:    Findings: No erythema or rash.  Neurological:     Mental Status: She is alert.  Psychiatric:        Mood and Affect: Mood normal.        Behavior: Behavior normal.     BP 120/80   Pulse 85   Temp 98.6 F (37 C) (Oral)   Wt 198 lb 9.6 oz (90.1 kg)   SpO2 97%   BMI 34.09 kg/m  Wt Readings from Last 3 Encounters:  09/08/18 198 lb 9.6 oz (90.1 kg)  05/09/18 200 lb 3.2 oz (90.8 kg)  12/30/17 205 lb 3.2 oz (93.1 kg)     Lab Results  Component Value Date   WBC 7.3 05/04/2018   HGB 13.1 05/04/2018   HCT 39.7 05/04/2018   PLT 319.0 05/04/2018   GLUCOSE 128 (H) 09/06/2018   CHOL 253 (H) 09/06/2018   TRIG 171.0 (H) 09/06/2018   HDL 48.80 09/06/2018   LDLDIRECT 171.2 05/26/2013   LDLCALC 170 (H) 09/06/2018   ALT 11 09/06/2018   AST 14 09/06/2018   NA 140 09/06/2018   K 4.5 09/06/2018   CL 101 09/06/2018   CREATININE 0.80 09/06/2018   BUN 19 09/06/2018   CO2 32 09/06/2018   TSH 3.93 05/04/2018   INR 0.9 04/03/2012   HGBA1C 6.6 (H) 09/06/2018   MICROALBUR 1.2 09/06/2018    Mm 3d Screen Breast Bilateral  Result Date: 05/09/2018 CLINICAL DATA:  Screening. EXAM: DIGITAL SCREENING BILATERAL MAMMOGRAM WITH TOMO AND CAD COMPARISON:  Previous exam(s). ACR Breast Density Category b: There are scattered areas of  fibroglandular density. FINDINGS: There are no findings suspicious for malignancy. Images were processed with CAD. IMPRESSION: No mammographic evidence of malignancy. A result letter of this screening mammogram will be mailed directly to the patient. RECOMMENDATION: Screening mammogram in one year. (Code:SM-B-01Y) BI-RADS CATEGORY  1: Negative. Electronically Signed   By: Franki Cabot M.D.   On: 05/09/2018 09:07       Assessment & Plan:   Problem List Items Addressed This Visit    Abdominal pain    Previous abdominal pain.  Better now.  Discussed with her today.  Remains on PPI.  Consider referral back to GI.  Will notify me if problems or desires further evaluation.        Aortic atherosclerosis ([place])    Unable to take statin medication.  Zetia.        Relevant Medications   ezetimibe (ZETIA) 10 MG tablet   BMI 34.0-34.9,adult    Discussed diet and exercise.  Follow.       Diabetes mellitus ([place])    a1c just checked and improved 6.6.  Discussed low carb diet and exercise.  Follow met b and a1c.        Relevant Orders   Hemoglobin Z6X   Basic metabolic panel   GERD (gastroesophageal reflux disease)    Controlled on current regimen.  Follow.        Relevant Medications   magnesium oxide (MAG-OX) 400 MG tablet   Hypercholesterolemia    Has tried multiple statin medications.  Intolerant.  Elevated cholesterol.  Discussed zetia.  Relevant Medications   ezetimibe (ZETIA) 10 MG tablet   Other Relevant Orders   Hepatic function panel   Lipid panel   Hypertension    Blood pressure under good control.  Continue same medication regimen.  Follow pressures.  Follow metabolic panel.        Relevant Medications   ezetimibe (ZETIA) 10 MG tablet       [name], MD

## 2018-09-11 ENCOUNTER — Encounter: Payer: Self-pay | Admitting: Internal Medicine

## 2018-09-11 NOTE — Assessment & Plan Note (Signed)
Unable to take statin medication.  Zetia.

## 2018-09-11 NOTE — Assessment & Plan Note (Signed)
Controlled on current regimen.  Follow.  

## 2018-09-11 NOTE — Assessment & Plan Note (Signed)
Discussed diet and exercise.  Follow.  

## 2018-09-11 NOTE — Assessment & Plan Note (Addendum)
Previous abdominal pain.  Better now.  Discussed with her today.  Remains on PPI.  Consider referral back to GI.  Will notify me if problems or desires further evaluation.

## 2018-09-11 NOTE — Assessment & Plan Note (Signed)
Blood pressure under good control.  Continue same medication regimen.  Follow pressures.  Follow metabolic panel.   

## 2018-09-11 NOTE — Assessment & Plan Note (Signed)
a1c just checked and improved 6.6.  Discussed low carb diet and exercise.  Follow met b and a1c.

## 2018-12-21 ENCOUNTER — Other Ambulatory Visit: Payer: Self-pay | Admitting: Internal Medicine

## 2018-12-22 NOTE — Telephone Encounter (Signed)
Pt would like for this to go to the [facility] mail order. Please advise

## 2018-12-27 ENCOUNTER — Other Ambulatory Visit: Payer: Self-pay | Admitting: Internal Medicine

## 2018-12-30 ENCOUNTER — Ambulatory Visit: Payer: Medicare Other

## 2018-12-30 ENCOUNTER — Ambulatory Visit: Payer: Medicare Other | Admitting: Internal Medicine

## 2018-12-30 ENCOUNTER — Ambulatory Visit (INDEPENDENT_AMBULATORY_CARE_PROVIDER_SITE_OTHER): Payer: Medicare Other

## 2018-12-30 ENCOUNTER — Other Ambulatory Visit: Payer: Self-pay

## 2018-12-30 DIAGNOSIS — Z0000 Encounter for general adult medical examination without abnormal findings: Secondary | ICD-10-CM

## 2018-12-30 NOTE — Patient Instructions (Addendum)
  Misty Jones , Thank you for taking time to come for your Medicare Wellness Visit. I appreciate your ongoing commitment to your health goals. Please review the following plan we discussed and let me know if I can assist you in the future.   These are the goals we discussed: Goals    . Follow up with Primary Care Provider     As needed       This is a list of the screening recommended for you and due dates:  Health Maintenance  Topic Date Due  . Flu Shot  02/25/2019  . Hemoglobin A1C  03/07/2019  . Mammogram  05/10/2019  . Eye exam for diabetics  06/01/2019  . Complete foot exam   09/09/2019  . Tetanus Vaccine  12/29/2020  . DEXA scan (bone density measurement)  Completed  . Pneumonia vaccines  Completed

## 2018-12-30 NOTE — Progress Notes (Signed)
Subjective:   Misty Jones is a 78 y.o. female who presents for Medicare Annual (Subsequent) preventive examination.  Review of Systems:  No ROS.  Medicare Wellness Virtual Visit.  Visual/audio telehealth visit, UTA vital signs.   See social history for additional risk factors.   Cardiac Risk Factors include: advanced age (>19men, >56 women);diabetes mellitus;hypertension     Objective:     Vitals: There were no vitals taken for this visit.  There is no height or weight on file to calculate BMI.  Advanced Directives 12/30/2018 01/31/2018 12/13/2017 12/11/2016 09/13/2016 06/29/2016 03/25/2016  Does Patient Have a Medical Advance Directive? Yes Yes Yes Yes No Yes Yes  Type of Advance Directive Living will [place];Living will [place];Living will Living will - Living will [place];Living will  Does patient want to make changes to medical advance directive? No - Patient declined No - Patient declined - No - Patient declined - - -  Copy of [place] in Chart? - - No - copy requested - - - -  Would patient like information on creating a medical advance directive? - - - - No - Patient declined - -    Tobacco Social History   Tobacco Use  Smoking Status Former Smoker  . Packs/day: 0.50  . Years: 20.00  . Pack years: 10.00  . Last attempt to quit: 07/26/1984  . Years since quitting: 34.4  Smokeless Tobacco Never Used  Tobacco Comment   quit smoking years ago     Counseling given: Not Answered Comment: quit smoking years ago   Clinical Intake:  Pre-visit preparation completed: Yes        Diabetes: Yes(Followed by pcp)     Interpreter Needed?: No     Past Medical History:  Diagnosis Date  . B12 deficiency   . Chicken pox   . Diabetes mellitus ([place])   . GERD (gastroesophageal reflux disease)   . History of diverticulitis of colon   . Hypercholesterolemia   . Hypertension   . Nasal  drainage    Past Surgical History:  Procedure Laterality Date  . ABDOMINAL HYSTERECTOMY  1984  . BACK SURGERY  1986   ruptured disc, Dr Hardin Negus [facility])  . CHOLECYSTECTOMY  1991  . CHOLECYSTECTOMY  1992  . COLONOSCOPY WITH PROPOFOL N/A 06/29/2016   Procedure: COLONOSCOPY WITH PROPOFOL;  Surgeon: Manya Silvas, MD;  Location[facility] ENDOSCOPY;  Service: Endoscopy;  Laterality: N/A;  . ESOPHAGOGASTRODUODENOSCOPY (EGD) WITH PROPOFOL N/A 12/13/2017   Procedure: ESOPHAGOGASTRODUODENOSCOPY (EGD) WITH PROPOFOL;  Surgeon: Manya Silvas, MD;  [facility] ENDOSCOPY;  Service: Endoscopy;  Laterality: N/A;  . TONSILLECTOMY     Family History  Problem Relation Age of Onset  . Hypertension Mother   . Cancer Mother        Mouth cancer  . Heart disease Father        myocardial infarction  . Raynaud syndrome Sister   . Asthma Sister   . Congenital heart disease Sister   . Thyroid disease Sister   . Thyroid cancer Daughter   . Breast cancer Neg Hx   . Colon cancer Neg Hx    Social History   Socioeconomic History  . Marital status: Divorced    Spouse name: Not on file  . Number of children: 2  . Years of education: Not on file  . Highest education level: Not on file  Occupational History  . Occupation: retired  Social Needs  . Financial resource strain: Not hard at all  . Food insecurity:    Worry: Never true    Inability: Never true  . Transportation needs:    Medical: No    Non-medical: No  Tobacco Use  . Smoking status: Former Smoker    Packs/day: 0.50    Years: 20.00    Pack years: 10.00    Last attempt to quit: 07/26/1984    Years since quitting: 34.4  . Smokeless tobacco: Never Used  . Tobacco comment: quit smoking years ago  Substance and Sexual Activity  . Alcohol use: No    Alcohol/week: 0.0 standard drinks  . Drug use: No  . Sexual activity: Never  Lifestyle  . Physical activity:    Days per week: Not on file    Minutes per session: Not on file  .  Stress: Not on file  Relationships  . Social connections:    Talks on phone: Not on file    Gets together: Not on file    Attends religious service: Not on file    Active member of club or organization: Not on file    Attends meetings of clubs or organizations: Not on file    Relationship status: Not on file  Other Topics Concern  . Not on file  Social History Narrative  . Not on file    Outpatient Encounter Medications as of 12/30/2018  Medication Sig  . aspirin 81 MG tablet Take 81 mg by mouth daily.  Marland Kitchen BAYER CONTOUR TEST test strip USE ONE STRIP TO CHECK GLUCOSE ONCE DAILY  . benazepril (LOTENSIN) 40 MG tablet TAKE 1 TABLET BY MOUTH ONCE DAILY  . Calcium Carbonate-Vitamin D (CALCIUM 600+D) 600-400 MG-UNIT per tablet Take 1 tablet by mouth 2 (two) times daily.  . Cholecalciferol (VITAMIN D) 2000 UNITS tablet Take 1,000 Units by mouth daily.   . cyanocobalamin (,VITAMIN B-12,) 1000 MCG/ML injection INJECT 1 ML (CC) INTRAMUSCULARLY ONCE EVERY MONTH  . ezetimibe (ZETIA) 10 MG tablet Take 1 tablet (10 mg total) by mouth daily.  Marland Kitchen glucose blood (CONTOUR TEST) test strip USE 1 STRIP TO CHECK GLUCOSE ONCE DAILY. DX 11.9.  Marland Kitchen hydrochlorothiazide (HYDRODIURIL) 25 MG tablet Take 1/2 (one-half) tablet by mouth once daily  . Lancets MISC Check sugars once a day Dx: E11.9 (Contour Next)  . magnesium oxide (MAG-OX) 400 MG tablet Take 1 tablet (400 mg total) by mouth daily.  . Melatonin 10 MG TABS Take by mouth.  . metFORMIN (GLUCOPHAGE) 500 MG tablet TAKE 1 TABLET BY MOUTH TWICE DAILY WITH MEALS  . Multiple Vitamin (MULTIVITAMIN WITH MINERALS) TABS tablet Take 2 tablets by mouth daily.  . niacin 100 MG tablet Take 100 mg by mouth at bedtime.  Marland Kitchen omeprazole (PRILOSEC) 20 MG capsule Take 20 mg by mouth daily.   No facility-administered encounter medications on file as of 12/30/2018.     Activities of Daily Living In your present state of health, do you have any difficulty performing the following  activities: 12/30/2018  Hearing? N  Vision? N  Difficulty concentrating or making decisions? N  Walking or climbing stairs? N  Dressing or bathing? N  Doing errands, shopping? N  Preparing Food and eating ? N  Using the Toilet? N  In the past six months, have you accidently leaked urine? N  Do you have problems with loss of bowel control? N  Managing your Medications? N  Managing your Finances? N  Housekeeping or managing your Housekeeping?  N  Some recent data might be hidden    Patient Care Team: [name], MD as PCP - General (Internal Medicine) [name], MD (Internal Medicine)    Assessment:   This is a routine wellness examination for Rosabella.  I connected with patient 12/30/18 at 10:00 AM EDT by a video/audio enabled telemedicine application and verified that I am speaking with the correct person using two identifiers. Patient stated full name and DOB. Patient gave permission to continue with virtual visit. Patient's location was at home and Nurse's location was at [place] office.   Patient notes her bladder seems overactive through the day and night waking up to 7 times on a regular basis. Denies pain and any other symptoms. Drinks 2 cups of caffeine at 9am and water through the day. Stops drinking fluids 2 hours or more prior to bedtime. Taking hydrochlorothiazide in the early morning. Deferred to pcp for follow up.   Keep all routine maintenance appointments.  Fasting labs scheduled 6/30 Follow up with pcp scheduled [place] - 04/2018 Colonoscopy - 06/2016 Bone Density - 12/2010 Glaucoma -none Hearing -demonstrates normal hearing during visit. Hemoglobin A1C - 08/2018 Cholesterol - 08/2018 Dental- UTD Vision- visits within the last 12 months.  Social  Alcohol intake - no     Smoking history- former  Smokers in home? none Illicit drug use? none Exercise - stationary bike daily about 30 minutes Diet - regular Sexually Active  -never BMI- discussed the importance of a healthy diet, water intake and the benefits of aerobic exercise.  Educational material provided.   Safety  Patient feels safe at home- yes Patient does have smoke detectors at home- yes Patient does wear sunscreen or protective clothing when in direct sunlight -yes Patient does wear seat belt when in a moving vehicle -yes  Covid-19 precautions and sickness symptoms discussed.   Activities of Daily Living Patient denies needing assistance with: driving, household chores, feeding themselves, getting from bed to chair, getting to the toilet, bathing/showering, dressing, managing money, or preparing meals.  No new identified risk were noted.    Depression Screen Patient denies losing interest in daily life, feeling hopeless, or crying easily over simple problems.   Medication-taking as directed and without issues.   Fall Screen Patient denies being afraid of falling or falling in the last year.   Memory Screen Patient is alert.  Patient denies difficulty focusing, concentrating or misplacing items. Correctly identified the president of the [place] , season and recall. Patient likes to read and work puzzles for brain stimulation.  Immunizations The following Immunizations were discussed: Influenza, shingles, pneumonia, and tetanus.   Other Providers Patient Care Team: [name], MD as PCP - General (Internal Medicine) [name], MD (Internal Medicine)  Exercise Activities and Dietary recommendations Current Exercise Habits: Home exercise routine, Type of exercise: calisthenics, Time (Minutes): 30, Frequency (Times/Week): 5, Weekly Exercise (Minutes/Week): 150, Intensity: Mild  Goals    . Follow up with Primary Care Provider     As needed       Fall Risk Fall Risk  12/30/2018 05/09/2018 12/24/2017 12/11/2016 03/25/2016  Falls in the past year? 0 No No Yes No   Depression Screen PHQ 2/9 Scores 12/30/2018 05/09/2018 12/24/2017  12/11/2016  PHQ - 2 Score 0 0 0 0  PHQ- 9 Score - - - 0     Cognitive Function MMSE - Mini Mental State Exam 12/11/2016 12/12/2015  Orientation to time 5 5  Orientation to Place  5 5  Registration 3 3  Attention/ Calculation 5 5  Recall 3 3  Language- name 2 objects 2 2  Language- repeat 1 1  Language- follow 3 step command 3 3  Language- read & follow direction 1 1  Write a sentence 1 1  Copy design 1 1  Total score 30 30     6CIT Screen 12/30/2018 12/24/2017  What Year? 0 points 0 points  What month? 0 points 0 points  What time? 0 points 0 points  Count back from 20 0 points 0 points  Months in reverse 0 points 0 points  Repeat phrase 0 points 0 points  Total Score 0 0    Immunization History  Administered Date(s) Administered  . Influenza Split 05/31/2012, 04/16/2014  . Influenza, High Dose Seasonal PF 03/25/2016, 04/23/2017, 05/09/2018  . Influenza-Unspecified 04/26/2012, 05/31/2012, 05/03/2013, 04/16/2014, 05/08/2015, 03/25/2016  . Pneumococcal Conjugate-13 05/30/2013  . Pneumococcal Polysaccharide-23 12/12/2015  . Zoster 06/28/2012   Screening Tests Health Maintenance  Topic Date Due  . INFLUENZA VACCINE  02/25/2019  . HEMOGLOBIN A1C  03/07/2019  . MAMMOGRAM  05/10/2019  . OPHTHALMOLOGY EXAM  06/01/2019  . FOOT EXAM  09/09/2019  . TETANUS/TDAP  12/29/2020  . DEXA SCAN  Completed  . PNA vac Low Risk Adult  Completed      Plan:   End of life planning; Advance aging; Advanced directives discussed.  Copy of current HCPOA/Living Will requested.    I have personally reviewed and noted the following in the patient's chart:   . Medical and social history . Use of alcohol, tobacco or illicit drugs  . Current medications and supplements . Functional ability and status . Nutritional status . Physical activity . Advanced directives . List of other physicians . Hospitalizations, surgeries, and ER visits in previous 12 months . Vitals . Screenings to include  cognitive, depression, and falls . Referrals and appointments  In addition, I have reviewed and discussed with patient certain preventive protocols, quality metrics, and best practice recommendations. A written personalized care plan for preventive services as well as general preventive health recommendations were provided to patient.     Varney Biles, LPN  09/28/1936   Reviewed above information.  Agree with assessment and plan.  Will f/u regarding her bladder issues.    Dr Nicki Reaper

## 2019-01-09 DIAGNOSIS — L812 Freckles: Secondary | ICD-10-CM | POA: Diagnosis not present

## 2019-01-09 DIAGNOSIS — L820 Inflamed seborrheic keratosis: Secondary | ICD-10-CM | POA: Diagnosis not present

## 2019-01-09 DIAGNOSIS — D485 Neoplasm of uncertain behavior of skin: Secondary | ICD-10-CM | POA: Diagnosis not present

## 2019-01-09 DIAGNOSIS — L821 Other seborrheic keratosis: Secondary | ICD-10-CM | POA: Diagnosis not present

## 2019-01-09 DIAGNOSIS — D225 Melanocytic nevi of trunk: Secondary | ICD-10-CM | POA: Diagnosis not present

## 2019-01-12 ENCOUNTER — Telehealth: Payer: Self-pay | Admitting: Internal Medicine

## 2019-01-24 ENCOUNTER — Other Ambulatory Visit: Payer: Self-pay

## 2019-01-24 ENCOUNTER — Other Ambulatory Visit (INDEPENDENT_AMBULATORY_CARE_PROVIDER_SITE_OTHER): Payer: Medicare Other

## 2019-01-24 DIAGNOSIS — E119 Type 2 diabetes mellitus without complications: Secondary | ICD-10-CM | POA: Diagnosis not present

## 2019-01-24 DIAGNOSIS — E7800 Pure hypercholesterolemia, unspecified: Secondary | ICD-10-CM

## 2019-01-24 LAB — HEPATIC FUNCTION PANEL
ALT: 13 U/L (ref 0–35)
AST: 13 U/L (ref 0–37)
Albumin: 3.9 g/dL (ref 3.5–5.2)
Alkaline Phosphatase: 71 U/L (ref 39–117)
Bilirubin, Direct: 0.1 mg/dL (ref 0.0–0.3)
Total Bilirubin: 0.4 mg/dL (ref 0.2–1.2)
Total Protein: 6 g/dL (ref 6.0–8.3)

## 2019-01-24 LAB — BASIC METABOLIC PANEL
BUN: 14 mg/dL (ref 6–23)
CO2: 32 mEq/L (ref 19–32)
Calcium: 9.1 mg/dL (ref 8.4–10.5)
Chloride: 101 mEq/L (ref 96–112)
Creatinine, Ser: 0.87 mg/dL (ref 0.40–1.20)
GFR: 62.85 mL/min (ref >60.00)
Glucose, Bld: 138 mg/dL — ABNORMAL HIGH (ref 70–99)
Potassium: 5 mEq/L (ref 3.5–5.1)
Sodium: 141 mEq/L (ref 135–145)

## 2019-01-24 LAB — LIPID PANEL
Cholesterol: 254 mg/dL — ABNORMAL HIGH (ref 0–200)
HDL: 53.9 mg/dL (ref >39.00)
LDL Cholesterol: 163 mg/dL — ABNORMAL HIGH (ref 0–99)
NonHDL: 199.82
Total CHOL/HDL Ratio: 5
Triglycerides: 183 mg/dL — ABNORMAL HIGH (ref 0.0–149.0)
VLDL: 36.6 mg/dL (ref 0.0–40.0)

## 2019-01-24 LAB — HEMOGLOBIN A1C: Hgb A1c MFr Bld: 6.8 % — ABNORMAL HIGH (ref 4.6–6.5)

## 2019-01-26 ENCOUNTER — Ambulatory Visit (INDEPENDENT_AMBULATORY_CARE_PROVIDER_SITE_OTHER): Payer: Medicare Other | Admitting: Internal Medicine

## 2019-01-26 ENCOUNTER — Other Ambulatory Visit: Payer: Self-pay

## 2019-01-26 ENCOUNTER — Encounter: Payer: Self-pay | Admitting: Internal Medicine

## 2019-01-26 DIAGNOSIS — I700 Atherosclerosis of aorta: Secondary | ICD-10-CM | POA: Diagnosis not present

## 2019-01-26 DIAGNOSIS — E7800 Pure hypercholesterolemia, unspecified: Secondary | ICD-10-CM

## 2019-01-26 DIAGNOSIS — E119 Type 2 diabetes mellitus without complications: Secondary | ICD-10-CM | POA: Diagnosis not present

## 2019-01-26 DIAGNOSIS — R351 Nocturia: Secondary | ICD-10-CM | POA: Diagnosis not present

## 2019-01-26 DIAGNOSIS — K219 Gastro-esophageal reflux disease without esophagitis: Secondary | ICD-10-CM

## 2019-01-26 DIAGNOSIS — I10 Essential (primary) hypertension: Secondary | ICD-10-CM | POA: Diagnosis not present

## 2019-01-26 NOTE — Progress Notes (Signed)
Patient ID: Misty Jones, female   DOB: [birth year], 78 y.o.   MRN: [id number]   Virtual Visit via telephone Note  This visit type was conducted due to national recommendations for restrictions regarding the COVID-19 pandemic (e.g. social distancing).  This format is felt to be most appropriate for this patient at this time.  All issues noted in this document were discussed and addressed.  No physical exam was performed (except for noted visual exam findings with Video Visits).   I connected with Kennieth Francois by telephone and verified that I am speaking with the correct person using two identifiers. Location patient: home Location provider: work  Persons participating in the virtual visit: patient, provider  I discussed the limitations, risks, security and privacy concerns of performing an evaluation and management service by telephone and the availability of in person appointments.  The patient expressed understanding and agreed to proceed.   Reason for visit: scheduled follow up.    HPI: She reports she is doing relatively well.  Trying to stay active.  No chest pain.  No sob. No acid reflux.  No abdominal pain.  Bowels moving.  Does report increased nocturia.  States sometimes will go 6-7x/night.  No dysuria.  States has been evaluated by urology previously and was told she had a small bladder. No hematuria.  Trying to stay in due to covid restrictions.  No fever.  States that one month ago, she had some chest congestion and runny nose.  Took robitussin.  Symptoms resolved now.  Discussed cholesterol.  Was intolerant to statin medication.  zetia was too expensive.  Overall handling stress relatively well.     ROS: See pertinent positives and negatives per HPI.  Past Medical History:  Diagnosis Date  . B12 deficiency   . Chicken pox   . Diabetes mellitus ([place])   . GERD (gastroesophageal reflux disease)   . History of diverticulitis of colon   . Hypercholesterolemia   . Hypertension    . Nasal drainage     Past Surgical History:  Procedure Laterality Date  . ABDOMINAL HYSTERECTOMY  1984  . BACK SURGERY  1986   ruptured disc, Dr Hardin Negus [facility])  . CHOLECYSTECTOMY  1991  . CHOLECYSTECTOMY  1992  . COLONOSCOPY WITH PROPOFOL N/A 06/29/2016   Procedure: COLONOSCOPY WITH PROPOFOL;  Surgeon: Manya Silvas, MD;  Location: [facility] ENDOSCOPY;  Service: Endoscopy;  Laterality: N/A;  . ESOPHAGOGASTRODUODENOSCOPY (EGD) WITH PROPOFOL N/A 12/13/2017   Procedure: ESOPHAGOGASTRODUODENOSCOPY (EGD) WITH PROPOFOL;  Surgeon: Manya Silvas, MD;  Location: [facility] ENDOSCOPY;  Service: Endoscopy;  Laterality: N/A;  . TONSILLECTOMY      Family History  Problem Relation Age of Onset  . Hypertension Mother   . Cancer Mother        Mouth cancer  . Heart disease Father        myocardial infarction  . Raynaud syndrome Sister   . Asthma Sister   . Congenital heart disease Sister   . Thyroid disease Sister   . Thyroid cancer Daughter   . Breast cancer Neg Hx   . Colon cancer Neg Hx     SOCIAL HX: reviewed.    Current Outpatient Medications:  .  aspirin 81 MG tablet, Take 81 mg by mouth daily., Disp: , Rfl:  .  BAYER CONTOUR TEST test strip, USE ONE STRIP TO CHECK GLUCOSE ONCE DAILY, Disp: 50 each, Rfl: 5 .  benazepril (LOTENSIN) 40 MG tablet, TAKE 1 TABLET BY MOUTH ONCE DAILY,  Disp: 90 tablet, Rfl: 2 .  Calcium Carbonate-Vitamin D (CALCIUM 600+D) 600-400 MG-UNIT per tablet, Take 1 tablet by mouth 2 (two) times daily., Disp: , Rfl:  .  Cholecalciferol (VITAMIN D) 2000 UNITS tablet, Take 1,000 Units by mouth daily. , Disp: , Rfl:  .  cyanocobalamin (,VITAMIN B-12,) 1000 MCG/ML injection, INJECT 1 ML (CC) INTRAMUSCULARLY ONCE EVERY MONTH, Disp: 10 mL, Rfl: 1 .  ezetimibe (ZETIA) 10 MG tablet, Take 1 tablet (10 mg total) by mouth daily., Disp: 30 tablet, Rfl: 3 .  glucose blood (CONTOUR TEST) test strip, USE 1 STRIP TO CHECK GLUCOSE ONCE DAILY. DX 11.9., Disp: 50 each, Rfl: 5 .   hydrochlorothiazide (HYDRODIURIL) 25 MG tablet, Take 1/2 (one-half) tablet by mouth once daily, Disp: 90 tablet, Rfl: 0 .  Lancets MISC, Check sugars once a day Dx: E11.9 (Contour Next), Disp: 100 each, Rfl: 3 .  magnesium oxide (MAG-OX) 400 MG tablet, Take 1 tablet (400 mg total) by mouth daily., Disp: 30 tablet, Rfl: 2 .  Melatonin 10 MG TABS, Take by mouth., Disp: , Rfl:  .  metFORMIN (GLUCOPHAGE) 500 MG tablet, TAKE 1 TABLET BY MOUTH TWICE DAILY WITH MEALS, Disp: 180 tablet, Rfl: 0 .  Multiple Vitamin (MULTIVITAMIN WITH MINERALS) TABS tablet, Take 2 tablets by mouth daily., Disp: , Rfl:  .  niacin 100 MG tablet, Take 100 mg by mouth at bedtime., Disp: , Rfl:  .  omeprazole (PRILOSEC) 20 MG capsule, Take 20 mg by mouth daily., Disp: , Rfl:   EXAM:  GENERAL: alert.  Sounds to be in no acute distress.  Answering questions appropriately.    PSYCH/NEURO: pleasant and cooperative, no obvious depression or anxiety, speech and thought processing grossly intact  ASSESSMENT AND PLAN:  Discussed the following assessment and plan:  Aortic atherosclerosis (HCC) Unable to take statin medication.  zetia too expensive.  Discussed with pharmacy.   Diabetes mellitus Low carb diet and exercise.  Follow met b and a1c.  Recent a1c 6.8.    GERD (gastroesophageal reflux disease) Controlled on current regimen.  Follow.    Hypercholesterolemia Has tried multiple statin medications.  Intolerant. zetia too expensive.  Discuss with pharmacy regarding help with cost of medication.     Hypertension Blood pressure has been under good conrol.  Continue current medication regimen.  Follow pressures.  Follow metabolic panel.    Nocturia Increased problems with nocturia as outlined.  States has been evaluated by urology previously and was told she has a "small bladder".  a1c 6.8.  Check urine to confirm no infection, etc.  Further w/up pending results.  Discussed not drinking an increased amount of fluids prior  to bed and importance of emptying bladder prior to bed.      I discussed the assessment and treatment plan with the patient. The patient was provided an opportunity to ask questions and all were answered. The patient agreed with the plan and demonstrated an understanding of the instructions.   The patient was advised to call back or seek an in-person evaluation if the symptoms worsen or if the condition fails to improve as anticipated.  I provided 25 minutes of non-face-to-face time during this encounter.   Charlene Scott, MD  

## 2019-01-29 ENCOUNTER — Encounter: Payer: Self-pay | Admitting: Internal Medicine

## 2019-01-29 DIAGNOSIS — R351 Nocturia: Secondary | ICD-10-CM | POA: Insufficient documentation

## 2019-01-29 NOTE — Assessment & Plan Note (Signed)
Has tried multiple statin medications.  Intolerant. zetia too expensive.  Discuss with pharmacy regarding help with cost of medication.

## 2019-01-29 NOTE — Assessment & Plan Note (Signed)
Increased problems with nocturia as outlined.  States has been evaluated by urology previously and was told she has a "small bladder".  a1c 6.8.  Check urine to confirm no infection, etc.  Further w/up pending results.  Discussed not drinking an increased amount of fluids prior to bed and importance of emptying bladder prior to bed.

## 2019-01-29 NOTE — Assessment & Plan Note (Signed)
Controlled on current regimen.  Follow.  

## 2019-01-29 NOTE — Assessment & Plan Note (Signed)
Low carb diet and exercise.  Follow met b and a1c.  Recent a1c 6.8.

## 2019-01-29 NOTE — Assessment & Plan Note (Signed)
Blood pressure has been under good conrol.  Continue current medication regimen.  Follow pressures.  Follow metabolic panel.

## 2019-01-29 NOTE — Assessment & Plan Note (Signed)
Unable to take statin medication.  zetia too expensive.  Discussed with pharmacy.

## 2019-01-30 ENCOUNTER — Other Ambulatory Visit (INDEPENDENT_AMBULATORY_CARE_PROVIDER_SITE_OTHER): Payer: Medicare Other

## 2019-01-30 ENCOUNTER — Other Ambulatory Visit: Payer: Self-pay

## 2019-01-30 DIAGNOSIS — R351 Nocturia: Secondary | ICD-10-CM | POA: Diagnosis not present

## 2019-01-30 LAB — URINALYSIS, ROUTINE W REFLEX MICROSCOPIC
Bilirubin Urine: NEGATIVE
Nitrite: NEGATIVE
Specific Gravity, Urine: >=1.03 — AB (ref 1.000–1.030)
Total Protein, Urine: NEGATIVE
Urine Glucose: NEGATIVE
Urobilinogen, UA: 0.2 (ref 0.0–1.0)
pH: 6 (ref 5.0–8.0)

## 2019-01-31 LAB — URINE CULTURE
MICRO NUMBER:: 636806
SPECIMEN QUALITY:: ADEQUATE

## 2019-02-02 NOTE — Telephone Encounter (Signed)
Error

## 2019-03-23 ENCOUNTER — Other Ambulatory Visit: Payer: Self-pay | Admitting: Internal Medicine

## 2019-03-27 ENCOUNTER — Other Ambulatory Visit: Payer: Self-pay | Admitting: Internal Medicine

## 2019-03-30 ENCOUNTER — Other Ambulatory Visit: Payer: Self-pay | Admitting: Internal Medicine

## 2019-03-30 DIAGNOSIS — Z1231 Encounter for screening mammogram for malignant neoplasm of breast: Secondary | ICD-10-CM

## 2019-04-26 ENCOUNTER — Other Ambulatory Visit: Payer: Self-pay | Admitting: Internal Medicine

## 2019-05-02 ENCOUNTER — Telehealth: Payer: Self-pay | Admitting: Internal Medicine

## 2019-05-02 ENCOUNTER — Other Ambulatory Visit: Payer: Self-pay | Admitting: Internal Medicine

## 2019-05-02 MED ORDER — GLUCOSE BLOOD VI STRP: ORAL_STRIP | 12 refills | Status: DC

## 2019-05-02 NOTE — Telephone Encounter (Signed)
Copied from [place] [id number]. Topic: General - Other >> May 02, 2019 10:12 AM Keene Breath wrote: Reason for CRM: Pharmacy called to ask the nurse or doctor to send a new script for patient's medication, BAYER CONTOUR TEST test strip, with diagnosis codes.  Please advise and call to discuss at [phone]

## 2019-05-02 NOTE — Telephone Encounter (Signed)
SENT TO ME IN ERROR.  BUT DONE.  SENT TO [facility]/  DR Bary Leriche PATIENT

## 2019-05-02 NOTE — Telephone Encounter (Signed)
Pharmacy called to ask the nurse or doctor to send a new script for patient's medication, BAYER CONTOUR TEST test strip, with diagnosis codes. Misty Jones,cma

## 2019-05-17 ENCOUNTER — Ambulatory Visit
Admission: RE | Admit: 2019-05-17 | Discharge: 2019-05-17 | Disposition: A | Discharge status: Home / Self Care | Payer: Medicare Other | Source: Ambulatory Visit | Attending: Internal Medicine | Admitting: Internal Medicine

## 2019-05-17 DIAGNOSIS — Z1231 Encounter for screening mammogram for malignant neoplasm of breast: Secondary | ICD-10-CM | POA: Insufficient documentation

## 2019-05-26 ENCOUNTER — Other Ambulatory Visit: Payer: Self-pay

## 2019-05-29 ENCOUNTER — Telehealth: Payer: Self-pay | Admitting: *Deleted

## 2019-05-29 DIAGNOSIS — E119 Type 2 diabetes mellitus without complications: Secondary | ICD-10-CM

## 2019-05-29 DIAGNOSIS — E7800 Pure hypercholesterolemia, unspecified: Secondary | ICD-10-CM

## 2019-05-29 DIAGNOSIS — I10 Essential (primary) hypertension: Secondary | ICD-10-CM

## 2019-05-29 NOTE — Telephone Encounter (Signed)
Orders placed for labs

## 2019-05-29 NOTE — Telephone Encounter (Signed)
Please place future orders for lab appt.  

## 2019-05-30 ENCOUNTER — Other Ambulatory Visit: Payer: Self-pay

## 2019-05-30 ENCOUNTER — Other Ambulatory Visit (INDEPENDENT_AMBULATORY_CARE_PROVIDER_SITE_OTHER): Payer: Medicare Other

## 2019-05-30 DIAGNOSIS — I10 Essential (primary) hypertension: Secondary | ICD-10-CM | POA: Diagnosis not present

## 2019-05-30 DIAGNOSIS — E119 Type 2 diabetes mellitus without complications: Secondary | ICD-10-CM

## 2019-05-30 DIAGNOSIS — E7800 Pure hypercholesterolemia, unspecified: Secondary | ICD-10-CM | POA: Diagnosis not present

## 2019-05-30 LAB — CBC WITH DIFFERENTIAL/PLATELET
Basophils Absolute: 0.1 10*3/uL (ref 0.0–0.1)
Basophils Relative: 1.2 % (ref 0.0–3.0)
Eosinophils Absolute: 0.1 10*3/uL (ref 0.0–0.7)
Eosinophils Relative: 1.6 % (ref 0.0–5.0)
HCT: 40.8 % (ref 36.0–46.0)
Hemoglobin: 13.2 g/dL (ref 12.0–15.0)
Lymphocytes Relative: 33.5 % (ref 12.0–46.0)
Lymphs Abs: 2.7 10*3/uL (ref 0.7–4.0)
MCHC: 32.2 g/dL (ref 30.0–36.0)
MCV: 91.4 fl (ref 78.0–100.0)
Monocytes Absolute: 0.5 10*3/uL (ref 0.1–1.0)
Monocytes Relative: 6.5 % (ref 3.0–12.0)
Neutro Abs: 4.7 10*3/uL (ref 1.4–7.7)
Neutrophils Relative %: 57.2 % (ref 43.0–77.0)
Platelets: 327 10*3/uL (ref 150.0–400.0)
RBC: 4.47 Mil/uL (ref 3.87–5.11)
RDW: 14.6 % (ref 11.5–15.5)
WBC: 8.2 10*3/uL (ref 4.0–10.5)

## 2019-05-30 LAB — LIPID PANEL
Cholesterol: 262 mg/dL — ABNORMAL HIGH (ref 0–200)
HDL: 51.2 mg/dL (ref >39.00)
LDL Cholesterol: 179 mg/dL — ABNORMAL HIGH (ref 0–99)
NonHDL: 210.92
Total CHOL/HDL Ratio: 5
Triglycerides: 162 mg/dL — ABNORMAL HIGH (ref 0.0–149.0)
VLDL: 32.4 mg/dL (ref 0.0–40.0)

## 2019-05-30 LAB — HEPATIC FUNCTION PANEL
ALT: 24 U/L (ref 0–35)
AST: 15 U/L (ref 0–37)
Albumin: 4 g/dL (ref 3.5–5.2)
Alkaline Phosphatase: 68 U/L (ref 39–117)
Bilirubin, Direct: 0.1 mg/dL (ref 0.0–0.3)
Total Bilirubin: 0.4 mg/dL (ref 0.2–1.2)
Total Protein: 6.4 g/dL (ref 6.0–8.3)

## 2019-05-30 LAB — BASIC METABOLIC PANEL
BUN: 23 mg/dL (ref 6–23)
CO2: 32 mEq/L (ref 19–32)
Calcium: 9 mg/dL (ref 8.4–10.5)
Chloride: 98 mEq/L (ref 96–112)
Creatinine, Ser: 0.84 mg/dL (ref 0.40–1.20)
GFR: 65.39 mL/min (ref >60.00)
Glucose, Bld: 138 mg/dL — ABNORMAL HIGH (ref 70–99)
Potassium: 3.8 mEq/L (ref 3.5–5.1)
Sodium: 137 mEq/L (ref 135–145)

## 2019-05-30 LAB — TSH: TSH: 3.05 u[IU]/mL (ref 0.35–4.50)

## 2019-05-31 ENCOUNTER — Ambulatory Visit (INDEPENDENT_AMBULATORY_CARE_PROVIDER_SITE_OTHER): Payer: Medicare Other | Admitting: Internal Medicine

## 2019-05-31 DIAGNOSIS — Z23 Encounter for immunization: Secondary | ICD-10-CM

## 2019-05-31 DIAGNOSIS — Z0000 Encounter for general adult medical examination without abnormal findings: Secondary | ICD-10-CM | POA: Diagnosis not present

## 2019-05-31 DIAGNOSIS — K219 Gastro-esophageal reflux disease without esophagitis: Secondary | ICD-10-CM | POA: Diagnosis not present

## 2019-05-31 DIAGNOSIS — H353132 Nonexudative age-related macular degeneration, bilateral, intermediate dry stage: Secondary | ICD-10-CM | POA: Diagnosis not present

## 2019-05-31 DIAGNOSIS — I700 Atherosclerosis of aorta: Secondary | ICD-10-CM

## 2019-05-31 DIAGNOSIS — E119 Type 2 diabetes mellitus without complications: Secondary | ICD-10-CM

## 2019-05-31 DIAGNOSIS — I10 Essential (primary) hypertension: Secondary | ICD-10-CM

## 2019-05-31 DIAGNOSIS — E7800 Pure hypercholesterolemia, unspecified: Secondary | ICD-10-CM

## 2019-05-31 LAB — HM DIABETES EYE EXAM

## 2019-05-31 LAB — HEMOGLOBIN A1C: Hgb A1c MFr Bld: 7 % — ABNORMAL HIGH (ref 4.6–6.5)

## 2019-05-31 MED ORDER — BENAZEPRIL HCL 40 MG PO TABS: 40.0000 mg | ORAL_TABLET | Freq: Every day | ORAL | 2 refills | Status: DC

## 2019-05-31 MED ORDER — HYDROCHLOROTHIAZIDE 25 MG PO TABS: ORAL_TABLET | ORAL | 2 refills | Status: DC

## 2019-05-31 MED ORDER — METFORMIN HCL 500 MG PO TABS: 500.0000 mg | ORAL_TABLET | Freq: Two times a day (BID) | ORAL | 2 refills | Status: DC

## 2019-05-31 NOTE — Progress Notes (Signed)
Patient ID: Misty Jones, female   DOB: [birth year], 78 y.o.   MRN: [id number]   Subjective:    Patient ID: Misty Jones, female    DOB: [birth year], 78 y.o.   MRN: [id number]  HPI  Patient here for her physical exam.  She reports she is doing relatively well.  Tries to stay active.  Discussed diet and exercise.  No chest pain.  No sob.  No acid reflux.  No abdominal pain.  Bowels moving.  Handling stress.  Overall feels good.     Past Medical History:  Diagnosis Date  . B12 deficiency   . Chicken pox   . Diabetes mellitus ([place])   . GERD (gastroesophageal reflux disease)   . History of diverticulitis of colon   . Hypercholesterolemia   . Hypertension   . Nasal drainage    Past Surgical History:  Procedure Laterality Date  . ABDOMINAL HYSTERECTOMY  1984  . BACK SURGERY  1986   ruptured disc, Dr Hardin Negus [facility])  . CHOLECYSTECTOMY  1991  . CHOLECYSTECTOMY  1992  . COLONOSCOPY WITH PROPOFOL N/A 06/29/2016   Procedure: COLONOSCOPY WITH PROPOFOL;  Surgeon: Manya Silvas, MD;  Location: [facility] ENDOSCOPY;  Service: Endoscopy;  Laterality: N/A;  . ESOPHAGOGASTRODUODENOSCOPY (EGD) WITH PROPOFOL N/A 12/13/2017   Procedure: ESOPHAGOGASTRODUODENOSCOPY (EGD) WITH PROPOFOL;  Surgeon: Manya Silvas, MD;  Location: [facility] ENDOSCOPY;  Service: Endoscopy;  Laterality: N/A;  . TONSILLECTOMY     Family History  Problem Relation Age of Onset  . Hypertension Mother   . Cancer Mother        Mouth cancer  . Heart disease Father        myocardial infarction  . Raynaud syndrome Sister   . Asthma Sister   . Congenital heart disease Sister   . Thyroid disease Sister   . Thyroid cancer Daughter   . Breast cancer Neg Hx   . Colon cancer Neg Hx    Social History   Socioeconomic History  . Marital status: Divorced    Spouse name: Not on file  . Number of children: 2  . Years of education: Not on file  . Highest education level: Not on file  Occupational History  . Occupation: retired   Scientific laboratory technician  . Financial resource strain: Not hard at all  . Food insecurity    Worry: Never true    Inability: Never true  . Transportation needs    Medical: No    Non-medical: No  Tobacco Use  . Smoking status: Former Smoker    Packs/day: 0.50    Years: 20.00    Pack years: 10.00    Quit date: 07/26/1984    Years since quitting: 34.8  . Smokeless tobacco: Never Used  . Tobacco comment: quit smoking years ago  Substance and Sexual Activity  . Alcohol use: No    Alcohol/week: 0.0 standard drinks  . Drug use: No  . Sexual activity: Never  Lifestyle  . Physical activity    Days per week: Not on file    Minutes per session: Not on file  . Stress: Not on file  Relationships  . Social Herbalist on phone: Not on file    Gets together: Not on file    Attends religious service: Not on file    Active member of club or organization: Not on file    Attends meetings of clubs or organizations: Not on file    Relationship status: Not on  file  Other Topics Concern  . Not on file  Social History Narrative  . Not on file    Outpatient Encounter Medications as of 05/31/2019  Medication Sig  . aspirin 81 MG tablet Take 81 mg by mouth daily.  Marland Kitchen BAYER CONTOUR TEST test strip USE ONE STRIP TO CHECK GLUCOSE ONCE DAILY  . benazepril (LOTENSIN) 40 MG tablet Take 1 tablet (40 mg total) by mouth daily.  . Calcium Carbonate-Vitamin D (CALCIUM 600+D) 600-400 MG-UNIT per tablet Take 1 tablet by mouth 2 (two) times daily.  . Cholecalciferol (VITAMIN D) 2000 UNITS tablet Take 1,000 Units by mouth daily.   . CONTOUR TEST test strip USE 1 STRIP TO CHECK GLUCOSE ONCE DAILY  . cyanocobalamin (,VITAMIN B-12,) 1000 MCG/ML injection INJECT 1 ML (CC) INTRAMUSCULARLY ONCE EVERY MONTH  . ezetimibe (ZETIA) 10 MG tablet Take 1 tablet (10 mg total) by mouth daily.  Marland Kitchen glucose blood test strip USE ONCE DAILY TO CHECK BLOOD SUGARS  . hydrochlorothiazide (HYDRODIURIL) 25 MG tablet Take 1/2 (one-half)  tablet by mouth once daily  . Lancets MISC Check sugars once a day Dx: E11.9 (Contour Next)  . Melatonin 10 MG TABS Take by mouth.  . metFORMIN (GLUCOPHAGE) 500 MG tablet Take 1 tablet (500 mg total) by mouth 2 (two) times daily with a meal.  . Multiple Vitamin (MULTIVITAMIN WITH MINERALS) TABS tablet Take 2 tablets by mouth daily.  . niacin 100 MG tablet Take 100 mg by mouth at bedtime.  Marland Kitchen omeprazole (PRILOSEC) 20 MG capsule Take 20 mg by mouth daily.  . [DISCONTINUED] benazepril (LOTENSIN) 40 MG tablet Take 1 tablet by mouth once daily  . [DISCONTINUED] hydrochlorothiazide (HYDRODIURIL) 25 MG tablet Take 1/2 (one-half) tablet by mouth once daily  . [DISCONTINUED] magnesium oxide (MAG-OX) 400 MG tablet Take 1 tablet (400 mg total) by mouth daily.  . [DISCONTINUED] metFORMIN (GLUCOPHAGE) 500 MG tablet TAKE 1 TABLET BY MOUTH TWICE DAILY WITH MEALS   No facility-administered encounter medications on file as of 05/31/2019.    Review of Systems  Constitutional: Negative for appetite change and unexpected weight change.  HENT: Negative for congestion and sinus pressure.   Respiratory: Negative for cough, chest tightness and shortness of breath.   Cardiovascular: Negative for chest pain, palpitations and leg swelling.  Gastrointestinal: Negative for abdominal pain, diarrhea, nausea and vomiting.  Genitourinary: Negative for difficulty urinating and dysuria.  Musculoskeletal: Negative for joint swelling and myalgias.  Skin: Negative for color change and rash.  Neurological: Negative for dizziness, light-headedness and headaches.  Psychiatric/Behavioral: Negative for agitation and dysphoric mood.       Objective:    Physical Exam Constitutional:      General: She is not in acute distress.    Appearance: Normal appearance.  HENT:     Right Ear: External ear normal.     Left Ear: External ear normal.  Eyes:     General: No scleral icterus.       Right eye: No discharge.        Left eye:  No discharge.     Conjunctiva/sclera: Conjunctivae normal.  Neck:     Musculoskeletal: Neck supple. No muscular tenderness.     Thyroid: No thyromegaly.  Cardiovascular:     Rate and Rhythm: Normal rate and regular rhythm.  Pulmonary:     Effort: No respiratory distress.     Breath sounds: Normal breath sounds. No wheezing.  Abdominal:     General: Bowel sounds are normal.  Palpations: Abdomen is soft.     Tenderness: There is no abdominal tenderness.  Musculoskeletal:        General: No swelling or tenderness.  Lymphadenopathy:     Cervical: No cervical adenopathy.  Skin:    Findings: No erythema or rash.  Neurological:     Mental Status: She is alert.  Psychiatric:        Mood and Affect: Mood normal.        Behavior: Behavior normal.     BP 126/78   Pulse 89   Temp (!) 97.2 F (36.2 C)   Resp 16   Ht 5' 4" (1.626 m)   Wt 205 lb 12.8 oz (93.4 kg)   SpO2 99%   BMI 35.33 kg/m  Wt Readings from Last 3 Encounters:  05/31/19 205 lb 12.8 oz (93.4 kg)  09/08/18 198 lb 9.6 oz (90.1 kg)  05/09/18 200 lb 3.2 oz (90.8 kg)     Lab Results  Component Value Date   WBC 8.2 05/30/2019   HGB 13.2 05/30/2019   HCT 40.8 05/30/2019   PLT 327.0 05/30/2019   GLUCOSE 138 (H) 05/30/2019   CHOL 262 (H) 05/30/2019   TRIG 162.0 (H) 05/30/2019   HDL 51.20 05/30/2019   LDLDIRECT 171.2 05/26/2013   LDLCALC 179 (H) 05/30/2019   ALT 24 05/30/2019   AST 15 05/30/2019   NA 137 05/30/2019   K 3.8 05/30/2019   CL 98 05/30/2019   CREATININE 0.84 05/30/2019   BUN 23 05/30/2019   CO2 32 05/30/2019   TSH 3.05 05/30/2019   INR 0.9 04/03/2012   HGBA1C 7.0 (H) 05/30/2019   MICROALBUR 1.2 09/06/2018    Mm 3d Screen Breast Bilateral  Result Date: 05/17/2019 CLINICAL DATA:  Screening. EXAM: DIGITAL SCREENING BILATERAL MAMMOGRAM WITH TOMO AND CAD COMPARISON:  Previous exam(s). ACR Breast Density Category b: There are scattered areas of fibroglandular density. FINDINGS: There are no  findings suspicious for malignancy. Images were processed with CAD. IMPRESSION: No mammographic evidence of malignancy. A result letter of this screening mammogram will be mailed directly to the patient. RECOMMENDATION: Screening mammogram in one year. (Code:SM-B-01Y) BI-RADS CATEGORY  1: Negative. Electronically Signed   By: Ammie Ferrier M.D.   On: 05/17/2019 14:53       Assessment & Plan:   Problem List Items Addressed This Visit    Aortic atherosclerosis ([place])    Unable to take statin medication.        Relevant Medications   benazepril (LOTENSIN) 40 MG tablet   hydrochlorothiazide (HYDRODIURIL) 25 MG tablet   Diabetes mellitus ([facility])    Low carb diet and exercise.  Follow met b and a1c.   Lab Results  Component Value Date   HGBA1C 7.0 (H) 05/30/2019        Relevant Medications   benazepril (LOTENSIN) 40 MG tablet   metFORMIN (GLUCOPHAGE) 500 MG tablet   Other Relevant Orders   Hemoglobin F6E   Basic metabolic panel (future)   DM Microalbumin / creatinine urine ratio   GERD (gastroesophageal reflux disease)    Controlled on current medication regimen.  Follow.        Health care maintenance    Physical today 05/31/19.  Mammogram 05/17/19 - Birads I.  Colonoscopy 06/2016.  Recommended f/u 5 years.        Hypercholesterolemia    Has tried multiple statin medications.  States zetia too expensive.  Low cholesterol diet and exercise.  Follow lipid panel.  Relevant Medications   benazepril (LOTENSIN) 40 MG tablet   hydrochlorothiazide (HYDRODIURIL) 25 MG tablet   Other Relevant Orders   Hepatic function panel   Lipid panel   Hypertension    Blood pressure under good control.  Continue same medication regimen.  Follow pressures.  Follow metabolic panel.        Relevant Medications   benazepril (LOTENSIN) 40 MG tablet   hydrochlorothiazide (HYDRODIURIL) 25 MG tablet    Other Visit Diagnoses    Need for immunization against influenza       Relevant Orders    Flu Vaccine QUAD High Dose(Fluad) (Completed)       [name], MD

## 2019-06-04 ENCOUNTER — Encounter: Payer: Self-pay | Admitting: Internal Medicine

## 2019-06-04 NOTE — Assessment & Plan Note (Signed)
Low carb diet and exercise.  Follow met b and a1c.   Lab Results  Component Value Date   HGBA1C 7.0 (H) 05/30/2019   

## 2019-06-04 NOTE — Assessment & Plan Note (Addendum)
Physical today 05/31/19.  Mammogram 05/17/19 - Birads I.  Colonoscopy 06/2016.  Recommended f/u 5 years.

## 2019-06-04 NOTE — Assessment & Plan Note (Signed)
Controlled on current medication regimen.  Follow.   

## 2019-06-04 NOTE — Assessment & Plan Note (Signed)
Unable to take statin medication.

## 2019-06-04 NOTE — Assessment & Plan Note (Signed)
Has tried multiple statin medications.  States zetia too expensive.  Low cholesterol diet and exercise.  Follow lipid panel.

## 2019-06-04 NOTE — Assessment & Plan Note (Signed)
Blood pressure under good control.  Continue same medication regimen.  Follow pressures.  Follow metabolic panel.   

## 2019-06-15 ENCOUNTER — Encounter: Payer: Self-pay | Admitting: Internal Medicine

## 2019-06-26 ENCOUNTER — Telehealth: Payer: Self-pay | Admitting: *Deleted

## 2019-06-26 NOTE — Telephone Encounter (Signed)
Copied from [place] [phone]. Topic: General - Other >> Jun 26, 2019 12:01 PM Carolyn Stare wrote: Pt call to give 3 weeks of testing blood sugar   11 9 2020 104       11 10 2020  111,      11 11 2020 -107                           11 12 2020 -126                         11 13 2020- 119    11 14 2020 130,    11 15 2020 100            11 16 2020, 127              11 17 2020 ,119   11 18 2020 121                    11 19 2020 107     11 20 2020 111             11 21 2020 123             11 22 2020 150    11 23 2020 111                       11 24 2020 120    11 25 2020 102          11 26 2020 125              11 27 2020 111  11 28 2020 130                      11 29 2020 113

## 2019-06-26 NOTE — Telephone Encounter (Signed)
Notify pt that her overall sugars look good.

## 2019-06-27 NOTE — Telephone Encounter (Signed)
Notified patient of results and patient voiced understanding.

## 2019-06-27 NOTE — Telephone Encounter (Signed)
Called pt. No answer. LMTCB

## 2019-08-17 ENCOUNTER — Telehealth: Payer: Self-pay | Admitting: Internal Medicine

## 2019-08-17 DIAGNOSIS — R300 Dysuria: Secondary | ICD-10-CM

## 2019-08-17 NOTE — Telephone Encounter (Signed)
Pt called about a possible UTI. Burning when urinating. No appt avail. Please advise and Thank you!  Call pt @ [phone].

## 2019-08-17 NOTE — Addendum Note (Signed)
Addended by: Lars Masson on: 08/17/2019 03:38 PM   Modules accepted: Orders

## 2019-08-17 NOTE — Telephone Encounter (Signed)
Called pt. Confirmed doing ok. Having burning with urination. I have scheduled her to come in and leave urine tomorrow and virtual visit with Dr Nicki Reaper on Monday morning. Pt was ok with this. Advised to let me know if she needs anything else. Future orders placed for urine.

## 2019-08-18 ENCOUNTER — Other Ambulatory Visit: Payer: Medicare Other

## 2019-08-18 ENCOUNTER — Ambulatory Visit (INDEPENDENT_AMBULATORY_CARE_PROVIDER_SITE_OTHER): Payer: Medicare Other | Admitting: Internal Medicine

## 2019-08-18 ENCOUNTER — Other Ambulatory Visit (INDEPENDENT_AMBULATORY_CARE_PROVIDER_SITE_OTHER): Payer: Medicare Other

## 2019-08-18 ENCOUNTER — Encounter: Payer: Self-pay | Admitting: Internal Medicine

## 2019-08-18 ENCOUNTER — Other Ambulatory Visit: Payer: Self-pay

## 2019-08-18 DIAGNOSIS — R300 Dysuria: Secondary | ICD-10-CM

## 2019-08-18 DIAGNOSIS — E119 Type 2 diabetes mellitus without complications: Secondary | ICD-10-CM | POA: Diagnosis not present

## 2019-08-18 DIAGNOSIS — R198 Other specified symptoms and signs involving the digestive system and abdomen: Secondary | ICD-10-CM | POA: Diagnosis not present

## 2019-08-18 LAB — URINALYSIS, ROUTINE W REFLEX MICROSCOPIC
Bilirubin Urine: NEGATIVE
Hgb urine dipstick: NEGATIVE
Ketones, ur: NEGATIVE
Nitrite: NEGATIVE
RBC / HPF: NONE SEEN (ref 0–?)
Specific Gravity, Urine: 1.025 (ref 1.000–1.030)
Total Protein, Urine: NEGATIVE
Urine Glucose: NEGATIVE
Urobilinogen, UA: 0.2 (ref 0.0–1.0)
pH: 6 (ref 5.0–8.0)

## 2019-08-18 LAB — MICROALBUMIN / CREATININE URINE RATIO
Creatinine,U: 104.7 mg/dL
Microalb Creat Ratio: 1 mg/g (ref 0.0–30.0)
Microalb, Ur: 1 mg/dL (ref 0.0–1.9)

## 2019-08-18 NOTE — Progress Notes (Signed)
Patient ID: Misty Jones, female   DOB: 1940-12-22, 79 y.o.   MRN: [id number]   Virtual Visit via video Note  This visit type was conducted due to national recommendations for restrictions regarding the COVID-19 pandemic (e.g. social distancing).  This format is felt to be most appropriate for this patient at this time.  All issues noted in this document were discussed and addressed.  No physical exam was performed (except for noted visual exam findings with Video Visits).   I connected with Zahraa "[name] by a video enabled telemedicine application and verified that I am speaking with the correct person using two identifiers. Location patient: home Location provider: work  Persons participating in the virtual visit: patient, provider  The limitations, risks, security and privacy concerns of performing an evaluation and management service by video and the availability of in person appointments have been discussed.  The patient expressed understanding and agreed to proceed.   Reason for visit: work in appt  HPI: She reports she noticed dysuria last week.  Some discomfort and burning with urination and discomfort with end urination.  No vaginal symptoms.  No abdominal pain.  No hematuria.  No increased frequency.  No nausea or vomiting reported.  She is continuing to have am bowel issues.  Reports multiple times per week, will have loose stool and multiple bowel movements in the am.  Resolves by lunch.  She was questioning if metformin was contributing to these symptoms.  Discussed changing to extended release metformin.  She is in agreement.  Has some at home and plans to try.  States sugar last night - 112.  Some previous readings this week - 133-147.  Has not been watching her diet as well.     ROS: See pertinent positives and negatives per HPI.  Past Medical History:  Diagnosis Date  . B12 deficiency   . Chicken pox   . Diabetes mellitus ([place])   . GERD (gastroesophageal reflux  disease)   . History of diverticulitis of colon   . Hypercholesterolemia   . Hypertension   . Nasal drainage     Past Surgical History:  Procedure Laterality Date  . ABDOMINAL HYSTERECTOMY  1984  . BACK SURGERY  1986   ruptured disc, Dr Hardin Negus [facility])  . CHOLECYSTECTOMY  1991  . CHOLECYSTECTOMY  1992  . COLONOSCOPY WITH PROPOFOL N/A 06/29/2016   Procedure: COLONOSCOPY WITH PROPOFOL;  Surgeon: Manya Silvas, MD;  Location: [facility] ENDOSCOPY;  Service: Endoscopy;  Laterality: N/A;  . ESOPHAGOGASTRODUODENOSCOPY (EGD) WITH PROPOFOL N/A 12/13/2017   Procedure: ESOPHAGOGASTRODUODENOSCOPY (EGD) WITH PROPOFOL;  Surgeon: Manya Silvas, MD;  Location: [facility] ENDOSCOPY;  Service: Endoscopy;  Laterality: N/A;  . TONSILLECTOMY      Family History  Problem Relation Age of Onset  . Hypertension Mother   . Cancer Mother        Mouth cancer  . Heart disease Father        myocardial infarction  . Raynaud syndrome Sister   . Asthma Sister   . Congenital heart disease Sister   . Thyroid disease Sister   . Thyroid cancer Daughter   . Breast cancer Neg Hx   . Colon cancer Neg Hx     SOCIAL HX: reviewed.    Current Outpatient Medications:  .  aspirin 81 MG tablet, Take 81 mg by mouth daily., Disp: , Rfl:  .  BAYER CONTOUR TEST test strip, USE ONE STRIP TO CHECK GLUCOSE ONCE DAILY, Disp: 50 each,  Rfl: 5 .  benazepril (LOTENSIN) 40 MG tablet, Take 1 tablet (40 mg total) by mouth daily., Disp: 90 tablet, Rfl: 2 .  Calcium Carbonate-Vitamin D (CALCIUM 600+D) 600-400 MG-UNIT per tablet, Take 1 tablet by mouth 2 (two) times daily., Disp: , Rfl:  .  Cholecalciferol (VITAMIN D) 2000 UNITS tablet, Take 1,000 Units by mouth daily. , Disp: , Rfl:  .  CONTOUR TEST test strip, USE 1 STRIP TO CHECK GLUCOSE ONCE DAILY, Disp: 50 each, Rfl: 0 .  cyanocobalamin (,VITAMIN B-12,) 1000 MCG/ML injection, INJECT 1 ML (CC) INTRAMUSCULARLY ONCE EVERY MONTH, Disp: 10 mL, Rfl: 1 .  ezetimibe (ZETIA) 10 MG  tablet, Take 1 tablet (10 mg total) by mouth daily., Disp: 30 tablet, Rfl: 3 .  glucose blood test strip, USE ONCE DAILY TO CHECK BLOOD SUGARS, Disp: 100 each, Rfl: 12 .  hydrochlorothiazide (HYDRODIURIL) 25 MG tablet, Take 1/2 (one-half) tablet by mouth once daily, Disp: 90 tablet, Rfl: 2 .  Lancets MISC, Check sugars once a day Dx: E11.9 (Contour Next), Disp: 100 each, Rfl: 3 .  Melatonin 10 MG TABS, Take by mouth., Disp: , Rfl:  .  metFORMIN (GLUCOPHAGE XR) 500 MG 24 hr tablet, Take 1 tablet (500 mg total) by mouth 2 (two) times daily., Disp: 60 tablet, Rfl: 0 .  Multiple Vitamin (MULTIVITAMIN WITH MINERALS) TABS tablet, Take 2 tablets by mouth daily., Disp: , Rfl:  .  niacin 100 MG tablet, Take 100 mg by mouth at bedtime., Disp: , Rfl:  .  omeprazole (PRILOSEC) 20 MG capsule, Take 20 mg by mouth daily., Disp: , Rfl:   EXAM:  GENERAL: alert, oriented, appears well and in no acute distress  HEENT: atraumatic, conjunttiva clear, no obvious abnormalities on inspection of external nose and ears  NECK: normal movements of the head and neck  LUNGS: on inspection no signs of respiratory distress, breathing rate appears normal, no obvious gross SOB, gasping or wheezing  CV: no obvious cyanosis  PSYCH/NEURO: pleasant and cooperative, no obvious depression or anxiety, speech and thought processing grossly intact  ASSESSMENT AND PLAN:  Discussed the following assessment and plan:  Dysuria Symptoms as outlined.  Initial urine - ok.  Will send for culture.  Since feeling better, hold on abx.  Follow.  Stay hydrated.    Diabetes mellitus Sugars as outlined.  Low carb diet and exercise.  Follow met b and a1c.   Change in bowel movement Bowel changes as outlined.  Will change metformin to metformin ER and see if bowel issues improved.  She has extended release metformin at home.  Will try.  If symptoms improve, will send in rx for extended release metformin.     Meds ordered this encounter   Medications  . metFORMIN (GLUCOPHAGE XR) 500 MG 24 hr tablet    Sig: Take 1 tablet (500 mg total) by mouth 2 (two) times daily.    Dispense:  60 tablet    Refill:  0     I discussed the assessment and treatment plan with the patient. The patient was provided an opportunity to ask questions and all were answered. The patient agreed with the plan and demonstrated an understanding of the instructions.   The patient was advised to call back or seek an in-person evaluation if the symptoms worsen or if the condition fails to improve as anticipated.   [name], MD

## 2019-08-19 LAB — URINE CULTURE
MICRO NUMBER:: 10070941
SPECIMEN QUALITY:: ADEQUATE

## 2019-08-20 DIAGNOSIS — R198 Other specified symptoms and signs involving the digestive system and abdomen: Secondary | ICD-10-CM | POA: Insufficient documentation

## 2019-08-20 DIAGNOSIS — R300 Dysuria: Secondary | ICD-10-CM | POA: Insufficient documentation

## 2019-08-20 MED ORDER — METFORMIN HCL ER 500 MG PO TB24: 500.0000 mg | ORAL_TABLET | Freq: Two times a day (BID) | ORAL | 0 refills | Status: DC

## 2019-08-20 NOTE — Assessment & Plan Note (Signed)
Symptoms as outlined.  Initial urine - ok.  Will send for culture.  Since feeling better, hold on abx.  Follow.  Stay hydrated.

## 2019-08-20 NOTE — Assessment & Plan Note (Signed)
Bowel changes as outlined.  Will change metformin to metformin ER and see if bowel issues improved.  She has extended release metformin at home.  Will try.  If symptoms improve, will send in rx for extended release metformin.

## 2019-08-20 NOTE — Assessment & Plan Note (Signed)
Sugars as outlined.  Low carb diet and exercise.  Follow met b and a1c.

## 2019-08-21 ENCOUNTER — Ambulatory Visit: Payer: Medicare Other | Admitting: Internal Medicine

## 2019-08-23 ENCOUNTER — Other Ambulatory Visit: Payer: Self-pay

## 2019-08-23 ENCOUNTER — Other Ambulatory Visit: Payer: Self-pay | Admitting: Internal Medicine

## 2019-08-23 ENCOUNTER — Other Ambulatory Visit (INDEPENDENT_AMBULATORY_CARE_PROVIDER_SITE_OTHER): Payer: Medicare Other

## 2019-08-23 DIAGNOSIS — R300 Dysuria: Secondary | ICD-10-CM

## 2019-08-23 MED ORDER — METFORMIN HCL ER 500 MG PO TB24: 500.0000 mg | ORAL_TABLET | Freq: Two times a day (BID) | ORAL | 2 refills | Status: DC

## 2019-08-23 NOTE — Progress Notes (Signed)
I spoke with patient and she says the burning is just with urination. Patient stated that she would like to come in for another urine and culture. I did not order a urine dip- was not sure if you wanted one or not. Patient is coming in for her urine at 3:15. Also, she was seen for this last week. Do you want to see her again or just a f/u urine?

## 2019-08-23 NOTE — Addendum Note (Signed)
Addended by: Lars Masson on: 08/23/2019 09:01 AM   Modules accepted: Orders

## 2019-08-23 NOTE — Progress Notes (Signed)
F/u urine today. Hold for results before doing nystatin

## 2019-08-23 NOTE — Progress Notes (Signed)
rx sent in for metformin XR with refills.  If she is having persistent burning, please confirm if any peri vaginal irritation, redness.  Does the burning seem to come from urine touching the tissue.  If so, can try nystatin cream bid.  If not, then I recommend rechecking urine to confirm no infection.

## 2019-08-23 NOTE — Progress Notes (Signed)
We can do a f/u urine.  Did she want to try nystatin.

## 2019-08-24 LAB — URINALYSIS, ROUTINE W REFLEX MICROSCOPIC
Bilirubin Urine: NEGATIVE
Hgb urine dipstick: NEGATIVE
Ketones, ur: NEGATIVE
Nitrite: NEGATIVE
RBC / HPF: NONE SEEN (ref 0–?)
Specific Gravity, Urine: 1.02 (ref 1.000–1.030)
Total Protein, Urine: NEGATIVE
Urine Glucose: NEGATIVE
Urobilinogen, UA: 0.2 (ref 0.0–1.0)
pH: 6 (ref 5.0–8.0)

## 2019-08-24 LAB — URINE CULTURE
MICRO NUMBER:: 10086922
SPECIMEN QUALITY:: ADEQUATE

## 2019-08-26 ENCOUNTER — Other Ambulatory Visit: Payer: Self-pay | Admitting: Internal Medicine

## 2019-08-26 MED ORDER — NYSTATIN 100000 UNIT/GM EX CREA: 1.0000 "application " | TOPICAL_CREAM | Freq: Two times a day (BID) | CUTANEOUS | 0 refills | Status: DC

## 2019-08-26 NOTE — Progress Notes (Signed)
rx sent in for nystatin cream  

## 2019-09-23 ENCOUNTER — Ambulatory Visit: Payer: Medicare Other | Attending: Internal Medicine

## 2019-09-23 DIAGNOSIS — Z23 Encounter for immunization: Secondary | ICD-10-CM | POA: Insufficient documentation

## 2019-09-23 NOTE — Progress Notes (Signed)
   Covid-19 Vaccination Clinic  Name:  Misty Jones    MRN: [id number] DOB: Aug 22, 1940  09/23/2019  [name] was observed post Covid-19 immunization for 15 minutes without incidence. She was provided with Vaccine Information Sheet and instruction to access the V-Safe system.   [name] was instructed to call 911 with any severe reactions post vaccine: Marland Kitchen Difficulty breathing  . Swelling of your face and throat  . A fast heartbeat  . A bad rash all over your body  . Dizziness and weakness    Immunizations Administered    Name Date Dose VIS Date Route   Moderna COVID-19 Vaccine 09/23/2019  3:55 PM 0.5 mL 06/27/2019 Intramuscular   Manufacturer: Moderna   Lot: [id number]   [id number]

## 2019-09-25 ENCOUNTER — Other Ambulatory Visit: Payer: Self-pay

## 2019-09-25 ENCOUNTER — Other Ambulatory Visit (INDEPENDENT_AMBULATORY_CARE_PROVIDER_SITE_OTHER): Payer: Medicare Other

## 2019-09-25 DIAGNOSIS — E119 Type 2 diabetes mellitus without complications: Secondary | ICD-10-CM | POA: Diagnosis not present

## 2019-09-25 DIAGNOSIS — E7800 Pure hypercholesterolemia, unspecified: Secondary | ICD-10-CM | POA: Diagnosis not present

## 2019-09-25 LAB — LIPID PANEL
Cholesterol: 246 mg/dL — ABNORMAL HIGH (ref 0–200)
HDL: 42.3 mg/dL (ref >39.00)
NonHDL: 203.23
Total CHOL/HDL Ratio: 6
Triglycerides: 235 mg/dL — ABNORMAL HIGH (ref 0.0–149.0)
VLDL: 47 mg/dL — ABNORMAL HIGH (ref 0.0–40.0)

## 2019-09-25 LAB — BASIC METABOLIC PANEL
BUN: 19 mg/dL (ref 6–23)
CO2: 31 mEq/L (ref 19–32)
Calcium: 9 mg/dL (ref 8.4–10.5)
Chloride: 101 mEq/L (ref 96–112)
Creatinine, Ser: 0.85 mg/dL (ref 0.40–1.20)
GFR: 64.45 mL/min (ref >60.00)
Glucose, Bld: 144 mg/dL — ABNORMAL HIGH (ref 70–99)
Potassium: 4.5 mEq/L (ref 3.5–5.1)
Sodium: 140 mEq/L (ref 135–145)

## 2019-09-25 LAB — HEPATIC FUNCTION PANEL
ALT: 14 U/L (ref 0–35)
AST: 14 U/L (ref 0–37)
Albumin: 3.8 g/dL (ref 3.5–5.2)
Alkaline Phosphatase: 73 U/L (ref 39–117)
Bilirubin, Direct: 0.1 mg/dL (ref 0.0–0.3)
Total Bilirubin: 0.5 mg/dL (ref 0.2–1.2)
Total Protein: 5.9 g/dL — ABNORMAL LOW (ref 6.0–8.3)

## 2019-09-25 LAB — HEMOGLOBIN A1C: Hgb A1c MFr Bld: 7 % — ABNORMAL HIGH (ref 4.6–6.5)

## 2019-09-25 LAB — LDL CHOLESTEROL, DIRECT: Direct LDL: 156 mg/dL

## 2019-09-28 ENCOUNTER — Other Ambulatory Visit: Payer: Self-pay

## 2019-09-28 ENCOUNTER — Encounter: Payer: Self-pay | Admitting: Internal Medicine

## 2019-09-28 ENCOUNTER — Telehealth (INDEPENDENT_AMBULATORY_CARE_PROVIDER_SITE_OTHER): Payer: Medicare Other | Admitting: Internal Medicine

## 2019-09-28 DIAGNOSIS — R198 Other specified symptoms and signs involving the digestive system and abdomen: Secondary | ICD-10-CM | POA: Diagnosis not present

## 2019-09-28 DIAGNOSIS — I700 Atherosclerosis of aorta: Secondary | ICD-10-CM

## 2019-09-28 DIAGNOSIS — E1165 Type 2 diabetes mellitus with hyperglycemia: Secondary | ICD-10-CM | POA: Diagnosis not present

## 2019-09-28 DIAGNOSIS — K219 Gastro-esophageal reflux disease without esophagitis: Secondary | ICD-10-CM

## 2019-09-28 DIAGNOSIS — I10 Essential (primary) hypertension: Secondary | ICD-10-CM | POA: Diagnosis not present

## 2019-09-28 DIAGNOSIS — E7800 Pure hypercholesterolemia, unspecified: Secondary | ICD-10-CM | POA: Diagnosis not present

## 2019-09-28 NOTE — Progress Notes (Addendum)
Patient ID: Misty Jones, female   DOB: Oct 23, 1940, 79 y.o.   MRN: [id number]   Virtual Visit via video Note  This visit type was conducted due to national recommendations for restrictions regarding the COVID-19 pandemic (e.g. social distancing).  This format is felt to be most appropriate for this patient at this time.  All issues noted in this document were discussed and addressed.  No physical exam was performed (except for noted visual exam findings with Video Visits).   I connected with Misty Jones by a video enabled telemedicine application and verified that I am speaking with the correct person using two identifiers. Location patient: home Location provider: work  Persons participating in the virtual visit: patient, provider  The limitations, risks, security and privacy concerns of performing an evaluation and management service by telephone and the availability of in person appointments have been discussed.  The patient expressed understanding and agreed to proceed.   Reason for visit: scheduled follow up.   HPI: States she is doing relatively well.  Still having some GI issues.  Had diarrhea a couple of days ago.  "rolling stomach".  No fever.  No nausea or vomiting.  Takes pepto bismol.  No chest pain or sob reported.  No chest congestion or cough.  Off zetia.  Legs hurt with zetia.  Had problems with statin medication.  Taking niacin.  Discussed repatha.  She is interested in talking with pharmacist (Catie) about possibly trying this medication.  Still taking metformin - regular metformin.  Discussed extended release metformin.     ROS: See pertinent positives and negatives per HPI.  Past Medical History:  Diagnosis Date  . B12 deficiency   . Chicken pox   . Diabetes mellitus ([place])   . GERD (gastroesophageal reflux disease)   . History of diverticulitis of colon   . Hypercholesterolemia   . Hypertension   . Nasal drainage     Past Surgical History:  Procedure  Laterality Date  . ABDOMINAL HYSTERECTOMY  1984  . BACK SURGERY  1986   ruptured disc, Dr Hardin Negus [facility])  . CHOLECYSTECTOMY  1991  . CHOLECYSTECTOMY  1992  . COLONOSCOPY WITH PROPOFOL N/A 06/29/2016   Procedure: COLONOSCOPY WITH PROPOFOL;  Surgeon: Manya Silvas, MD;  Location[facility] ENDOSCOPY;  Service: Endoscopy;  Laterality: N/A;  . ESOPHAGOGASTRODUODENOSCOPY (EGD) WITH PROPOFOL N/A 12/13/2017   Procedure: ESOPHAGOGASTRODUODENOSCOPY (EGD) WITH PROPOFOL;  Surgeon: Manya Silvas, MD;  Location: [facility] ENDOSCOPY;  Service: Endoscopy;  Laterality: N/A;  . TONSILLECTOMY      Family History  Problem Relation Age of Onset  . Hypertension Mother   . Cancer Mother        Mouth cancer  . Heart disease Father        myocardial infarction  . Raynaud syndrome Sister   . Asthma Sister   . Congenital heart disease Sister   . Thyroid disease Sister   . Thyroid cancer Daughter   . Breast cancer Neg Hx   . Colon cancer Neg Hx     SOCIAL HX: reviewed.    Current Outpatient Medications:  .  aspirin 81 MG tablet, Take 81 mg by mouth daily., Disp: , Rfl:  .  BAYER CONTOUR TEST test strip, USE ONE STRIP TO CHECK GLUCOSE ONCE DAILY, Disp: 50 each, Rfl: 5 .  benazepril (LOTENSIN) 40 MG tablet, Take 1 tablet (40 mg total) by mouth daily., Disp: 90 tablet, Rfl: 2 .  Calcium Carbonate-Vitamin D (CALCIUM 600+D) 600-400 MG-UNIT  per tablet, Take 1 tablet by mouth 2 (two) times daily., Disp: , Rfl:  .  Cholecalciferol (VITAMIN D) 2000 UNITS tablet, Take 1,000 Units by mouth daily. , Disp: , Rfl:  .  CONTOUR TEST test strip, USE 1 STRIP TO CHECK GLUCOSE ONCE DAILY, Disp: 50 each, Rfl: 0 .  cyanocobalamin (,VITAMIN B-12,) 1000 MCG/ML injection, INJECT 1 ML (CC) INTRAMUSCULARLY ONCE EVERY MONTH, Disp: 10 mL, Rfl: 1 .  glucose blood test strip, USE ONCE DAILY TO CHECK BLOOD SUGARS, Disp: 100 each, Rfl: 12 .  hydrochlorothiazide (HYDRODIURIL) 25 MG tablet, Take 1/2 (one-half) tablet by mouth once  daily, Disp: 90 tablet, Rfl: 2 .  Lancets MISC, Check sugars once a day Dx: E11.9 (Contour Next), Disp: 100 each, Rfl: 3 .  Melatonin 10 MG TABS, Take by mouth., Disp: , Rfl:  .  metFORMIN (GLUCOPHAGE XR) 500 MG 24 hr tablet, Take 1 tablet (500 mg total) by mouth 2 (two) times daily., Disp: 60 tablet, Rfl: 2 .  Multiple Vitamin (MULTIVITAMIN WITH MINERALS) TABS tablet, Take 2 tablets by mouth daily., Disp: , Rfl:  .  niacin 100 MG tablet, Take 100 mg by mouth at bedtime., Disp: , Rfl:  .  nystatin cream (MYCOSTATIN), Apply 1 application topically 2 (two) times daily., Disp: 30 g, Rfl: 0 .  omeprazole (PRILOSEC) 20 MG capsule, Take 20 mg by mouth daily., Disp: , Rfl:   EXAM:  GENERAL: alert, oriented, appears well and in no acute distress  HEENT: atraumatic, conjunttiva clear, no obvious abnormalities on inspection of external nose and ears  NECK: normal movements of the head and neck  LUNGS: on inspection no signs of respiratory distress, breathing rate appears normal, no obvious gross SOB, gasping or wheezing  CV: no obvious cyanosis  PSYCH/NEURO: pleasant and cooperative, no obvious depression or anxiety, speech and thought processing grossly intact  ASSESSMENT AND PLAN:  Discussed the following assessment and plan:  Aortic atherosclerosis ([place]) Intolerant to statin medication and zetia.  Discussed repatha.    Diabetes mellitus Discussed low carb diet and exercise.  Bowels issues as outlined.  On metformin.  Discussed extended release metformin.  See if bowel issues improve with extended release.  Follow met b and a1c.   GERD (gastroesophageal reflux disease) On omeprazole.  Upper symptoms controlled.   Hypercholesterolemia Has tried multiple statin medications.  Intolerant.  Tried zetia - leg cramping.  Discussed repatha.  Agreeable to talk with Catie.    Hypertension Blood pressure under good control.  Continue same medication regimen - benazepril, hctz.  Follow pressures.   Follow metabolic panel.    Change in bowel movement Bowel changes, rolling stomach, etc - as outlined.  Change metformin.  Apparently not changed previously.  Follow closely.  If persistent symptoms, will require further w/up and evaluation.     Orders Placed This Encounter  Procedures  . Hemoglobin A1c    Standing Status:   Future    Standing Expiration Date:   10/06/2020  . Hepatic function panel    Standing Status:   Future    Standing Expiration Date:   10/06/2020  . Lipid panel    Standing Status:   Future    Standing Expiration Date:   10/06/2020  . Basic metabolic panel    Standing Status:   Future    Standing Expiration Date:   10/06/2020  . Microalbumin / creatinine urine ratio    Standing Status:   Future    Standing Expiration Date:  10/06/2020  . Ambulatory referral to Chronic Care Management Services    Referral Priority:   Routine    Referral Type:   Consultation    Referral Reason:   Care Coordination    Number of Visits Requested:   1     I discussed the assessment and treatment plan with the patient. The patient was provided an opportunity to ask questions and all were answered. The patient agreed with the plan and demonstrated an understanding of the instructions.   The patient was advised to call back or seek an in-person evaluation if the symptoms worsen or if the condition fails to improve as anticipated.    [name], MD

## 2019-10-05 ENCOUNTER — Telehealth: Payer: Self-pay | Admitting: Internal Medicine

## 2019-10-05 MED ORDER — METFORMIN HCL ER 500 MG PO TB24: 500.0000 mg | ORAL_TABLET | Freq: Two times a day (BID) | ORAL | 2 refills | Status: DC

## 2019-10-05 NOTE — Telephone Encounter (Signed)
Patient says Dr. Nicki Reaper was suppose to call in her metFORMIN (GLUCOPHAGE XR) 500 MG 24 hr tablet, Please send it to mail order pharmacy.

## 2019-10-07 ENCOUNTER — Encounter: Payer: Self-pay | Admitting: Internal Medicine

## 2019-10-07 NOTE — Addendum Note (Signed)
Addended by: Alisa Graff on: 10/07/2019 10:43 AM   Modules accepted: Orders

## 2019-10-07 NOTE — Assessment & Plan Note (Signed)
On omeprazole.  Upper symptoms controlled.   

## 2019-10-07 NOTE — Assessment & Plan Note (Signed)
Discussed low carb diet and exercise.  Bowels issues as outlined.  On metformin.  Discussed extended release metformin.  See if bowel issues improve with extended release.  Follow met b and a1c.

## 2019-10-07 NOTE — Assessment & Plan Note (Signed)
Bowel changes, rolling stomach, etc - as outlined.  Change metformin.  Apparently not changed previously.  Follow closely.  If persistent symptoms, will require further w/up and evaluation.

## 2019-10-07 NOTE — Assessment & Plan Note (Signed)
Has tried multiple statin medications.  Intolerant.  Tried zetia - leg cramping.  Discussed repatha.  Agreeable to talk with Catie.

## 2019-10-07 NOTE — Assessment & Plan Note (Signed)
Blood pressure under good control.  Continue same medication regimen - benazepril, hctz.  Follow pressures.  Follow metabolic panel.

## 2019-10-07 NOTE — Assessment & Plan Note (Signed)
Intolerant to statin medication and zetia.  Discussed repatha.

## 2019-10-09 ENCOUNTER — Telehealth: Payer: Self-pay | Admitting: Internal Medicine

## 2019-10-09 NOTE — Chronic Care Management (AMB) (Signed)
  Chronic Care Management   Note  10/09/2019 Name: Misty Jones MRN: [id number] DOB: August 16, 1940  Misty Jones is a 79 y.o. year old female who is a primary care patient of [name], MD. I reached out to [name] by phone today in response to a referral sent by Ms. Reita May PCP, Dr. [name]     [name] was given information about Chronic Care Management services today including:  1. CCM service includes personalized support from designated clinical staff supervised by her physician, including individualized plan of care and coordination with other care providers 2. 24/7 contact phone numbers for assistance for urgent and routine care needs. 3. Service will only be billed when office clinical staff spend 20 minutes or more in a month to coordinate care. 4. Only one practitioner may furnish and bill the service in a calendar month. 5. The patient may stop CCM services at any time (effective at the end of the month) by phone call to the office staff. 6. The patient will be responsible for cost sharing (co-pay) of up to 20% of the service fee (after annual deductible is met).  Patient agreed to services and verbal consent obtained.   Follow up plan: Telephone appointment with care management team member scheduled for:11/09/2019  Glenna Durand, [place] Management ??[email or web address] ??[phone]

## 2019-10-21 ENCOUNTER — Ambulatory Visit: Payer: Medicare Other | Attending: Internal Medicine

## 2019-10-21 DIAGNOSIS — Z23 Encounter for immunization: Secondary | ICD-10-CM

## 2019-10-21 NOTE — Progress Notes (Signed)
   Covid-19 Vaccination Clinic  Name:  Misty Jones    MRN: [id number] DOB: 03/13/1941  10/21/2019  [name] was observed post Covid-19 immunization for 15 minutes without incident. She was provided with Vaccine Information Sheet and instruction to access the V-Safe system.   [name] was instructed to call 911 with any severe reactions post vaccine: Marland Kitchen Difficulty breathing  . Swelling of face and throat  . A fast heartbeat  . A bad rash all over body  . Dizziness and weakness   Immunizations Administered    Name Date Dose VIS Date Route   Moderna COVID-19 Vaccine 10/21/2019  2:06 PM 0.5 mL 06/27/2019 Intramuscular   Manufacturer: Levan Hurst   LotFY:[id number]   [place]:[id number]

## 2019-11-09 ENCOUNTER — Ambulatory Visit (INDEPENDENT_AMBULATORY_CARE_PROVIDER_SITE_OTHER): Payer: Medicare Other | Admitting: Pharmacist

## 2019-11-09 DIAGNOSIS — E1165 Type 2 diabetes mellitus with hyperglycemia: Secondary | ICD-10-CM | POA: Diagnosis not present

## 2019-11-09 DIAGNOSIS — E7800 Pure hypercholesterolemia, unspecified: Secondary | ICD-10-CM | POA: Diagnosis not present

## 2019-11-09 DIAGNOSIS — I700 Atherosclerosis of aorta: Secondary | ICD-10-CM

## 2019-11-09 MED ORDER — REPATHA SURECLICK 140 MG/ML ~~LOC~~ SOAJ: 140.0000 mg | SUBCUTANEOUS | 3 refills | Status: DC

## 2019-11-09 MED ORDER — METFORMIN HCL ER 500 MG PO TB24: 500.0000 mg | ORAL_TABLET | Freq: Two times a day (BID) | ORAL | 2 refills | Status: DC

## 2019-11-09 NOTE — Patient Instructions (Signed)
Visit Information  Goals Addressed            This Visit's Progress     Patient Stated   . PharmD "I want to be on the best medication possible" (pt-stated)       [place] (see longtitudinal plan of care for additional care plan information)  Current Barriers:  . Financial barriers: patient does not have prescription drug insurance; Medicare A&B and [place]. Current pays [facility] of pocket [facility] at [facility] . Uncontrolled hyperlipidemia, complicated by F7TK, HTN o Current antihyperlipidemic regimen: none o Previous antihyperlipidemic medications tried: simvastatin, atorvastatin, rosuvastatin, pravastatin, ezetimibe; muscle pains o Most recent lipid panel: LDL: 156, TG: 235, TC: 246, HDL: 75 o ASCVD risk enhancing conditions: age >19, DM, HTN, CKD, Family history: father passed from MI at age 39 o 10-year ASCVD risk score: 48% . Diabetes, uncontrolled, last A1c 7.0%. Reports diarrhea with metformin IR. Switched to metformin ER, but this prescription was set to No Print and never sent o Current regimen: metformin XR 500 mg BID, though is taking IR 500 mg QPM o Most recent eGFR ~64  Pharmacist Clinical Goal(s):  Marland Kitchen Over the next 90 days, patient will work with PharmD and providers towards optimized antihyperlipidemic therapy  Interventions: . Comprehensive medication review performed; medication list updated in electronic medical record.  Bertram Savin care team collaboration (see longitudinal plan of care) . Re-sent metformin ER (ensured order was set to Normal) to [facility] mail order per patient preference. Recommended she start w/ metformin 500 mg ER once daily, then after a week if no diarrhea, increase to BID. Continue as tolerated . Reviewed PCSK9 therapy, benefits. Patient amenable to starting Repatha 140 mg Q2W. She does not have prescription drug insurance, so is eligible for assistance from [employer]. Will collaborate w/ CPhT to mail  patient her portion of application. Patient will complete, sign, and mail back w/ proof of income (SSA + retirement). I will collaborate w/ Dr. Nicki Reaper for her signature  Patient Self Care Activities:  . Patient will focus on medication adherence   Initial goal documentation        Patient verbalizes understanding of instructions provided today.   Plan: - Scheduled f/u call 12/21/19  Catie Darnelle Maffucci, PharmD, BCACP, CPP Clinical Pharmacist [employer] [phone]

## 2019-11-09 NOTE — Chronic Care Management (AMB) (Signed)
Chronic Care Management   Note  11/09/2019 Name: Misty Jones MRN: [id number] DOB: June 12, 1941   Subjective:  Misty Jones is a 79 y.o. year old female who is a primary care patient of [name], MD. The CCM team was consulted for assistance with chronic disease management and care coordination needs.    Contacted patient for medication management review as above.  Review of patient status, including review of consultants reports, laboratory and other test data, was performed as part of comprehensive evaluation and provision of chronic care management services.   SDOH (Social Determinants of Health) assessments and interventions performed: yes  Objective:  Lab Results  Component Value Date   CREATININE 0.85 09/25/2019   CREATININE 0.84 05/30/2019   CREATININE 0.87 01/24/2019    Lab Results  Component Value Date   HGBA1C 7.0 (H) 09/25/2019       Component Value Date/Time   CHOL 246 (H) 09/25/2019 0841   CHOL 209 (H) 04/03/2012 0133   TRIG 235.0 (H) 09/25/2019 0841   TRIG 103 04/03/2012 0133   HDL 42.30 09/25/2019 0841   HDL 52 04/03/2012 0133   CHOLHDL 6 09/25/2019 0841   VLDL 47.0 (H) 09/25/2019 0841   VLDL 21 04/03/2012 0133   LDLCALC 179 (H) 05/30/2019 0835   LDLCALC 136 (H) 04/03/2012 0133   LDLDIRECT 156.0 09/25/2019 0841    Clinical ASCVD: No . But aortic atherosclerosis The 10-year ASCVD risk score Mikey Bussing DC Jr., et al., 2013) is: 48.2%   Values used to calculate the score:     Age: 12 years     Sex: Female     Is Non-Hispanic African American: No     Diabetic: Yes     Tobacco smoker: No     Systolic Blood Pressure: 818 mmHg     Is BP treated: Yes     HDL Cholesterol: 42.3 mg/dL     Total Cholesterol: 246 mg/dL    BP Readings from Last 3 Encounters:  05/31/19 126/78  09/08/18 120/80  05/09/18 122/86    Allergies  Allergen Reactions  . Augmentin [Amoxicillin-Pot Clavulanate] Other (See Comments)    vomiting  . Protonix [Pantoprazole  Sodium]     Medications Reviewed Today    Reviewed by De Hollingshead, [facility] (Pharmacist) on 11/09/19 at 1108  Med List Status: <None>  Medication Order Taking? Sig Documenting Provider Last Dose Status Informant  aspirin 81 MG tablet [id number] Yes Take 81 mg by mouth daily. [provider] Taking Active Self           Med Note Nat Christen Nov 09, 2019 11:02 AM)          Discontinued 11/09/19 1108 (Completed Course)   benazepril (LOTENSIN) 40 MG tablet [id number] Yes Take 1 tablet (40 mg total) by mouth daily. [name], MD Taking Active   Calcium Carbonate-Vitamin D (CALCIUM 600+D) 600-400 MG-UNIT per tablet [id number] No Take 1 tablet by mouth 2 (two) times daily. [provider] Not Taking Active Self  Cholecalciferol (VITAMIN D) 2000 UNITS tablet [id number] Yes Take 1,000 Units by mouth daily.  [provider] Taking Active Self  cyanocobalamin (,VITAMIN B-12,) 1000 MCG/ML injection [id number] Yes INJECT 1 ML (CC) INTRAMUSCULARLY ONCE EVERY MONTH [name], MD Taking Active   glucose blood test strip [id number] Yes USE ONCE DAILY TO CHECK BLOOD SUGARS Crecencio Mc, MD Taking Active   hydrochlorothiazide (HYDRODIURIL) 25 MG tablet [id number] Yes Take 1/2 (one-half) tablet by mouth  once daily [name], MD Taking Active   Lancets MISC [id number] Yes Check sugars once a day Dx: E11.9 (Contour Next) [name], MD Taking Active Self  Melatonin 10 MG TABS [id number] Yes Take by mouth. [provider] Taking Active   metFORMIN (GLUCOPHAGE XR) 500 MG 24 hr tablet [id number] Yes Take 1 tablet (500 mg total) by mouth 2 (two) times daily. [name], MD  Active         Discontinued 11/09/19 1108 (Completed Course)   niacin 500 MG tablet [id number] Yes Take 500 mg by mouth at bedtime.  [provider] Taking Active Self  nystatin cream (MYCOSTATIN) [id number] No Apply 1 application topically 2 (two) times daily.  Patient  not taking: Reported on 11/09/2019   [name], MD Not Taking Active   omeprazole (PRILOSEC) 20 MG capsule [id number] Yes Take 20 mg by mouth daily. [provider] Taking Active            Assessment:   Goals Addressed            This Visit's Progress     Patient Stated   . PharmD "I want to be on the best medication possible" (pt-stated)       [place] (see longtitudinal plan of care for additional care plan information)  Current Barriers:  . Financial barriers: patient does not have prescription drug insurance; Medicare A&B and [place]. Current pays out of pocket [facility] . Uncontrolled hyperlipidemia, complicated by V4QV, HTN o Current antihyperlipidemic regimen: none o Previous antihyperlipidemic medications tried: simvastatin, atorvastatin, rosuvastatin, pravastatin, ezetimibe; muscle pains o Most recent lipid panel: LDL: 156, TG: 235, TC: 246, HDL: 40 o ASCVD risk enhancing conditions: age >24, DM, HTN, CKD, Family history: father passed from MI at age 46 o 10-year ASCVD risk score: 48% . Diabetes, uncontrolled, last A1c 7.0%. Reports diarrhea with metformin IR. Switched to metformin ER, but this prescription was set to No Print and never sent o Current regimen: metformin XR 500 mg BID, though is taking IR 500 mg QPM o Most recent eGFR ~64  Pharmacist Clinical Goal(s):  Marland Kitchen Over the next 90 days, patient will work with PharmD and providers towards optimized antihyperlipidemic therapy  Interventions: . Comprehensive medication review performed; medication list updated in electronic medical record.  Bertram Savin care team collaboration (see longitudinal plan of care) . Re-sent metformin ER (ensured order was set to Normal) to [facility] mail order per patient preference. Recommended she start w/ metformin 500 mg ER once daily, then after a week if no diarrhea, increase to BID. Continue as tolerated . Reviewed PCSK9  therapy, benefits. Patient amenable to starting Repatha 140 mg Q2W. She does not have prescription drug insurance, so is eligible for assistance from [employer]. Will collaborate w/ CPhT to mail patient her portion of application. Patient will complete, sign, and mail back w/ proof of income (SSA + retirement). I will collaborate w/ Dr. Nicki Reaper for her signature  Patient Self Care Activities:  . Patient will focus on medication adherence   Initial goal documentation        Plan: - Scheduled f/u call 12/21/19  Catie Darnelle Maffucci, PharmD, BCACP, CPP Clinical Pharmacist [place] [phone]

## 2019-11-09 NOTE — Progress Notes (Addendum)
Reviewed.  Form signed and given back to Catie.  Agree with plan.   Dr Nicki Reaper

## 2019-11-14 ENCOUNTER — Other Ambulatory Visit: Payer: Self-pay | Admitting: Pharmacy Technician

## 2019-11-14 NOTE — Patient Outreach (Addendum)
[facility]) Care Management  11/14/2019  CHARMIAN FERRAN November 23, 1940 [id number]                                       Medication Assistance Referral  Referral From: [facility] Embedded RPh Catie T.   Medication/Company: Repatha / [employer] Patient application portion:  Mailed Provider application portion:  N/A Embedded RPh to have signed while in clinic to Dr. Nicki Reaper Provider address/fax verified via: Office website     Follow up:  Will follow up with patient in 10-14 business days to confirm application(s) have been received.  Houa Nie P. [place] Vi, [place]  [phone]

## 2019-11-24 ENCOUNTER — Other Ambulatory Visit: Payer: Self-pay | Admitting: Pharmacy Technician

## 2019-11-24 NOTE — Patient Outreach (Signed)
[facility]) Care Management  11/24/2019  MALEY BIBLE 22-Jan-1941 [id number]   Successful call placed to patient regarding patient assistance application(s) for Repatha with [employer] , HIPAA identifiers verified.   Patient informed she has received the application. She informed she was not interested in pursuing the assistance currently as she was unsure if she wanted to take the medication. She informed she wanted to talk with Dr. Nicki Reaper before initiating therapy.  Will route note to embedded [facility] RPh Catie Darnelle Maffucci about patient's decision to speak with provider.  Follow up:  Will route note to embedded [place] for case closure as patient does not want to pursue assistance at this time.. Will gladly reopen case if patient agrees to taking the medication.  Jennier Schissler P. Dorianne Perret, [place]  [phone]

## 2019-11-27 ENCOUNTER — Ambulatory Visit: Payer: Self-pay | Admitting: Pharmacist

## 2019-11-27 NOTE — Chronic Care Management (AMB) (Signed)
  Chronic Care Management   Note  11/27/2019 Name: MARIONETTE MISA MRN: [id number] DOB: 04-24-1941  LAKITA DULING is a 79 y.o. year old female who is a primary care patient of [name], MD. The CCM team was consulted for assistance with chronic disease management and care coordination needs.    Received message from CPhT that patient wanted to pause on pursuing patient assistance for Repatha until she had discussed with Dr. Nicki Reaper. Contacted patient, left HIPAA compliant message for her to return my call at her convenience.   Catie Darnelle Maffucci, PharmD, [place], CPP Clinical Pharmacist [place] [phone]

## 2019-11-27 NOTE — Progress Notes (Signed)
Reviewed.  Agree with plan   Dr Umeka Wrench 

## 2019-12-21 ENCOUNTER — Ambulatory Visit: Payer: Self-pay | Admitting: Pharmacist

## 2019-12-21 ENCOUNTER — Telehealth: Payer: Medicare Other

## 2019-12-21 NOTE — Chronic Care Management (AMB) (Signed)
  Chronic Care Management   Note  12/21/2019 Name: SERAH CIFARELLI MRN: [id number] DOB: Nov 26, 1940  BRINLY SHUTLER is a 79 y.o. year old female who is a primary care patient of [name], MD. The CCM team was consulted for assistance with chronic disease management and care coordination needs.    Attempted to contact patient for medication management review. Left HIPAA compliant message for patient to return my call at their convenience.   Plan: - Will collaborate with Care Guide to outreach to schedule follow up with me  Catie Darnelle Maffucci, PharmD, [place], [place] Pharmacist [place] [phone]

## 2019-12-21 NOTE — Progress Notes (Signed)
I have reviewed the above note and agree. I was available to the pharmacist for consultation.  Nissa Stannard, MD 

## 2019-12-26 ENCOUNTER — Telehealth: Payer: Self-pay | Admitting: Internal Medicine

## 2019-12-26 NOTE — Chronic Care Management (AMB) (Signed)
  Care Management   Note  12/26/2019 Name: Misty Jones MRN: [id number] DOB: 1941-03-22  Misty Jones is a 79 y.o. year old female who is a primary care patient of [name], MD and is actively engaged with the care management team. I reached out to [name] by phone today to assist with re-scheduling a follow up visit with the Pharmacist  Follow up plan: Telephone appointment with care management team member scheduled for:03/11/2020  Noreene Larsson, [place], [place], [place] [id number] Direct Dial: [phone] [email or web address]: [email or web address]

## 2020-01-02 ENCOUNTER — Ambulatory Visit: Payer: Medicare Other

## 2020-01-05 ENCOUNTER — Ambulatory Visit: Payer: Medicare Other

## 2020-01-11 DIAGNOSIS — H353211 Exudative age-related macular degeneration, right eye, with active choroidal neovascularization: Secondary | ICD-10-CM | POA: Diagnosis not present

## 2020-01-15 ENCOUNTER — Ambulatory Visit (INDEPENDENT_AMBULATORY_CARE_PROVIDER_SITE_OTHER): Payer: Medicare Other

## 2020-01-15 VITALS — Ht 64.0 in | Wt 205.0 lb

## 2020-01-15 DIAGNOSIS — Z0000 Encounter for general adult medical examination without abnormal findings: Secondary | ICD-10-CM | POA: Diagnosis not present

## 2020-01-15 NOTE — Progress Notes (Addendum)
Subjective:   Misty Jones is a 79 y.o. female who presents for Medicare Annual (Subsequent) preventive examination.  Review of Systems    No ROS.  Medicare Wellness Virtual Visit.   Cardiac Risk Factors include: advanced age (>7mn, >>13women);diabetes mellitus;hypertension     Objective:    Today's Vitals   01/15/20 1339  Weight: 205 lb (93 kg)  Height: _0  (1.626 m)   Body mass index is 35.19 kg/m.  Advanced Directives 01/15/2020 12/30/2018 01/31/2018 12/13/2017 12/11/2016 09/13/2016 06/29/2016  Does Patient Have a Medical Advance Directive? _1  No Yes  Type of Advance Directive Living will [place] - Living will  Does patient want to make changes to medical advance directive? No - Patient declined No - Patient declined No - Patient declined - No - Patient declined - -  Copy of HP[place] Chart? - - - No - copy requested - - -  Would patient like information on creating a medical advance directive? - - - - - No - Patient declined -    Current Medications (verified) Outpatient Encounter Medications as of 01/15/2020  Medication Sig  . aspirin 81 MG tablet Take 81 mg by mouth daily.  . benazepril (LOTENSIN) 40 MG tablet Take 1 tablet (40 mg total) by mouth daily.  . Calcium Carbonate-Vitamin D (CALCIUM 600+D) 600-400 MG-UNIT per tablet Take 1 tablet by mouth 2 (two) times daily.  . Cholecalciferol (VITAMIN D) 2000 UNITS tablet Take 1,000 Units by mouth daily.   . cyanocobalamin (,VITAMIN B-12,) 1000 MCG/ML injection INJECT 1 ML (CC) INTRAMUSCULARLY ONCE EVERY MONTH  . Evolocumab (REPATHA SURECLICK) 1818MG/ML SOAJ Inject 140 mg into the skin every 14 (fourteen) days.  .Marland Kitchenglucose blood test strip USE ONCE DAILY TO CHECK BLOOD SUGARS  . hydrochlorothiazide (HYDRODIURIL) 25 MG tablet Take 1/2 (one-half) tablet by mouth once daily  . Lancets MISC Check  sugars once a day Dx: E11.9 (Contour Next)  . Melatonin 10 MG TABS Take by mouth.  . metFORMIN (GLUCOPHAGE XR) 500 MG 24 hr tablet Take 1 tablet (500 mg total) by mouth 2 (two) times daily.  . niacin 500 MG tablet Take 500 mg by mouth at bedtime.   .Marland Kitchennystatin cream (MYCOSTATIN) Apply 1 application topically 2 (two) times daily. (Patient not taking: Reported on 11/09/2019)  . omeprazole (PRILOSEC) 20 MG capsule Take 20 mg by mouth daily.   No facility-administered encounter medications on file as of 01/15/2020.    Allergies (verified) Augmentin [amoxicillin-pot clavulanate] and Protonix [pantoprazole sodium]   History: Past Medical History:  Diagnosis Date  . B12 deficiency   . Chicken pox   . Diabetes mellitus ([place]   . GERD (gastroesophageal reflux disease)   . History of diverticulitis of colon   . Hypercholesterolemia   . Hypertension   . Nasal drainage    Past Surgical History:  Procedure Laterality Date  . ABDOMINAL HYSTERECTOMY  1984  . BACK SURGERY  1986   ruptured disc, Dr [facility]us([facility]  . CHOLECYSTECTOMY  1991  . CHOLECYSTECTOMY  1992  . COLONOSCOPY WITH PROPOFOL N/A 06/29/2016   Procedure: COLONOSCOPY WITH PROPOFOL;  Surgeon: RManya Silvas MD;  [facility];  Service: Endoscopy;  Laterality: N/A;  . ESOPHAGOGASTRODUODENOSCOPY (EGD) WITH PROPOFOL N/A 12/13/2017   Procedure: ESOPHAGOGASTRODUODENOSCOPY (EGD) WITH PROPOFOL;  Surgeon: EManya Silvas MD;  [facility];  Service: Endoscopy;  Laterality: N/A;  . TONSILLECTOMY     Family History  Problem Relation Age of Onset  . Hypertension Mother   . Cancer Mother        Mouth cancer  . Heart disease Father        myocardial infarction  . Raynaud syndrome Sister   . Asthma Sister   . Congenital heart disease Sister   . Thyroid disease Sister   . Thyroid cancer Daughter   . Breast cancer Neg Hx   . Colon cancer Neg Hx    Social History   Socioeconomic History  . Marital  status: Divorced    Spouse name: Not on file  . Number of children: 2  . Years of education: Not on file  . Highest education level: Not on file  Occupational History  . Occupation: retired  Tobacco Use  . Smoking status: Former Smoker    Packs/day: 0.50    Years: 20.00    Pack years: 10.00    Quit date: 07/26/1984    Years since quitting: 35.4  . Smokeless tobacco: Never Used  . Tobacco comment: quit smoking years ago  Vaping Use  . Vaping Use: Never used  Substance and Sexual Activity  . Alcohol use: No    Alcohol/week: 0.0 standard drinks  . Drug use: No  . Sexual activity: Never  Other Topics Concern  . Not on file  Social History Narrative  . Not on file   Social Determinants of Health   Financial Resource Strain:   . Difficulty of Paying Living Expenses:   Food Insecurity:   . Worried About Charity fundraiser in the Last Year:   . Arboriculturist in the Last Year:   Transportation Needs:   . Film/video editor (Medical):   Marland Kitchen Lack of Transportation (Non-Medical):   Physical Activity:   . Days of Exercise per Week:   . Minutes of Exercise per Session:   Stress:   . Feeling of Stress :   Social Connections:   . Frequency of Communication with Friends and Family:   . Frequency of Social Gatherings with Friends and Family:   . Attends Religious Services:   . Active Member of Clubs or Organizations:   . Attends Archivist Meetings:   Marland Kitchen Marital Status:     Tobacco Counseling Counseling given: Not Answered Comment: quit smoking years ago   Clinical Intake:  Pre-visit preparation completed: Yes        Diabetes: Yes (Followed by pcp)  How often do you need to have someone help you when you read instructions, pamphlets, or other written materials from your doctor or pharmacy?: 1 - Never   Interpreter Needed?: No      Activities of Daily Living In your present state of health, do you have any difficulty performing the following  activities: 01/15/2020  Hearing? N  Vision? N  Difficulty concentrating or making decisions? N  Walking or climbing stairs? N  Dressing or bathing? N  Doing errands, shopping? Y  Preparing Food and eating ? N  Using the Toilet? N  In the past six months, have you accidently leaked urine? N  Do you have problems with loss of bowel control? N  Managing your Medications? N  Managing your Finances? N  Housekeeping or managing your Housekeeping? N  Some recent data might be hidden    Patient Care Team: [name], MD as PCP - General (Internal Medicine) Nicki Reaper,  Randell Patient, MD (Internal Medicine) De Hollingshead, [facility] as Pharmacist (Pharmacist)  Indicate any recent Medical Services you may have received from other than [facility] providers in the past year (date may be approximate).     Assessment:   This is a routine wellness examination for Misty Jones.  I connected with Misty Jones today by telephone and verified that I am speaking with the correct person using two identifiers. Location patient: home Location provider: work Persons participating in the virtual visit: patient, Marine scientist.    I discussed the limitations, risks, security and privacy concerns of performing an evaluation and management service by telephone and the availability of in person appointments. The patient expressed understanding and verbally consented to this telephonic visit.    Interactive audio and video telecommunications were attempted between this provider and patient, however failed, due to patient having technical difficulties OR patient did not have access to video capability.  We continued and completed visit with audio only.  Some vital signs may be absent or patient reported.   Hearing/Vision screen  Hearing Screening   _0  _1  _2  _3  _4  _5  _6  _7  _8   Right ear:           Left ear:           Comments: Patient is able to hear conversational tones without difficulty.  No issues  reported.   Vision Screening Comments: Followed by [facility] Wears corrective lenses Detached retina; specialist  Cataract extraction, bilateral Visual acuity not assessed, virtual visit.  They have seen their ophthalmologist in the last 12 months.   Dietary issues and exercise activities discussed: Current Exercise Habits: The patient does not participate in regular exercise at present  Goals      Patient Stated   .  PharmD "I want to be on the best medication possible" (pt-stated)      [place] (see longtitudinal plan of care for additional care plan information)  Current Barriers:  . Financial barriers: patient does not have prescription drug insurance; Medicare A&B and [place]. Current pays out of pocket for medications at [facility] . Uncontrolled hyperlipidemia, complicated by O7SJ, HTN o Current antihyperlipidemic regimen: none o Previous antihyperlipidemic medications tried: simvastatin, atorvastatin, rosuvastatin, pravastatin, ezetimibe; muscle pains o Most recent lipid panel: LDL: 156, TG: 235, TC: 246, HDL: 46 o ASCVD risk enhancing conditions: age >74, DM, HTN, CKD, Family history: father passed from MI at age 80 o 10-year ASCVD risk score: 48% . Diabetes, uncontrolled, last A1c 7.0%. Reports diarrhea with metformin IR. Switched to metformin ER, but this prescription was set to No Print and never sent o Current regimen: metformin XR 500 mg BID, though is taking IR 500 mg QPM o Most recent eGFR ~64  Pharmacist Clinical Goal(s):  Marland Kitchen Over the next 90 days, patient will work with PharmD and providers towards optimized antihyperlipidemic therapy  Interventions: . Comprehensive medication review performed; medication list updated in electronic medical record.  Bertram Savin care team collaboration (see longitudinal plan of care) . Re-sent metformin ER (ensured order was set to Normal) [facility] per patient preference.  Recommended she start w/ metformin 500 mg ER once daily, then after a week if no diarrhea, increase to BID. Continue as tolerated . Reviewed PCSK9 therapy, benefits. Patient amenable to starting Repatha 140 mg Q2W. She does not have prescription drug insurance, so is eligible for assistance from [employer]. Will collaborate w/ [facility] to mail patient her portion of application. Patient will  complete, sign, and mail back w/ proof of income (SSA + retirement). I will collaborate w/ Dr. Nicki Reaper for her signature  Patient Self Care Activities:  . Patient will focus on medication adherence   Initial goal documentation       Other   .  Follow up with Primary Care Provider      As needed      Depression Screen PHQ 2/9 Scores 01/15/2020 12/30/2018 05/09/2018 12/24/2017 12/11/2016 03/25/2016 12/12/2015  PHQ - 2 Score 0 0 0 0 0 0 0  PHQ- 9 Score - - - - 0 - -    Fall Risk Fall Risk  01/15/2020 05/31/2019 12/30/2018 05/09/2018 12/24/2017  Falls in the past year? 0 0 0 No No  Number falls in past yr: 0 - - - -  Injury with Fall? 0 - - - -  Follow up Falls evaluation completed Falls evaluation completed - - -    Any stairs in or around the home? Yes , 3 steps If so, are there any without handrails? No  Home free of loose throw rugs in walkways, pet beds, electrical cords, etc? Yes  Adequate lighting in your home to reduce risk of falls? Yes   ASSISTIVE DEVICES UTILIZED TO PREVENT FALLS:  Life alert? No  Use of a cane, walker or w/c? No  Grab bars in the bathroom? No  Shower chair or bench in shower? Yes  Elevated toilet seat or a handicapped toilet? No   TIMED UP AND GO: Was the test performed? No , virtual visit.   Cognitive Function: MMSE - Mini Mental State Exam 12/11/2016 12/12/2015  Orientation to time 5 5  Orientation to Place 5 5  Registration 3 3  Attention/ Calculation 5 5  Recall 3 3  Language- name 2 objects 2 2  Language- repeat 1 1  Language- follow 3 step command 3 3    Language- read & follow direction 1 1  Write a sentence 1 1  Copy design 1 1  Total score 30 30     6CIT Screen 01/15/2020 12/30/2018 12/24/2017  What Year? 0 points 0 points 0 points  What month? 0 points 0 points 0 points  What time? - 0 points 0 points  Count back from 20 - 0 points 0 points  Months in reverse 0 points 0 points 0 points  Repeat phrase 0 points 0 points 0 points  Total Score - 0 0    Immunizations Immunization History  Administered Date(s) Administered  . Fluad Quad(high Dose 65+) 05/31/2019  . Influenza Split 05/31/2012, 04/16/2014  . Influenza, High Dose Seasonal PF 03/25/2016, 04/23/2017, 05/09/2018  . Influenza-Unspecified 04/26/2012, 05/31/2012, 05/03/2013, 04/16/2014, 05/08/2015, 03/25/2016  . Moderna SARS-COVID-2 Vaccination 09/23/2019, 10/21/2019  . Pneumococcal Conjugate-13 05/30/2013  . Pneumococcal Polysaccharide-23 12/12/2015  . Zoster 06/28/2012   Zostavax completed No   Shingrix Completed?: No.    Education has been provided regarding the importance of this vaccine. Patient has been advised to call insurance company to determine out of pocket expense if they have not yet received this vaccine. Advised may also receive vaccine at local pharmacy or Health Dept. Verbalized acceptance and understanding.   Health Maintenance  Health Maintenance  Topic Date Due  . FOOT EXAM  01/31/2020 (Originally 09/09/2019)  . Hepatitis C Screening  01/14/2021 (Originally 1940/11/12)  . INFLUENZA VACCINE  02/25/2020  . HEMOGLOBIN A1C  03/27/2020  . MAMMOGRAM  05/16/2020  . OPHTHALMOLOGY EXAM  05/30/2020  . TETANUS/TDAP  12/29/2020  .  DEXA SCAN  Completed  . COVID-19 Vaccine  Completed  . PNA vac Low Risk Adult  Completed   Foot exam- deferred until 01/31/20. Notes no changes. Followed by pcp.   Lung Cancer Screening: completed 01/15/2009  Hepatitis C Screening:  Deferred. Does not qualify.   Vision Screening: Recommended annual ophthalmology exams for early  detection of glaucoma and other disorders of the eye. Is the patient up to date with their annual eye exam?  Yes   Dental Screening: Recommended annual dental exams for proper oral hygiene  Community Resource Referral / Chronic Care Management: CRR required this visit?  No   CCM required this visit?  No      Plan:   Keep all routine maintenance appointments.   Next scheduled lab 01/25/20  Follow up 01/31/20 @ 10:00  CCM - 03/11/20  I have personally reviewed and noted the following in the patient's chart:   . Medical and social history . Use of alcohol, tobacco or illicit drugs  . Current medications and supplements . Functional ability and status . Nutritional status . Physical activity . Advanced directives . List of other physicians . Hospitalizations, surgeries, and ER visits in previous 12 months . Vitals . Screenings to include cognitive, depression, and falls . Referrals and appointments  In addition, I have reviewed and discussed with patient certain preventive protocols, quality metrics, and best practice recommendations. A written personalized care plan for preventive services as well as general preventive health recommendations were provided to patient.     Varney Biles, LPN   10/15/252    Reviewed above information.  Agree with assessment and plan.    Dr Nicki Reaper

## 2020-01-15 NOTE — Patient Instructions (Addendum)
Misty Jones , Thank you for taking time to come for your Medicare Wellness Visit. I appreciate your ongoing commitment to your health goals. Please review the following plan we discussed and let me know if I can assist you in the future.   These are the goals we discussed: Goals      Patient Stated   .  PharmD "I want to be on the best medication possible" (pt-stated)      [place] (see longtitudinal plan of care for additional care plan information)  Current Barriers:  . Financial barriers: patient does not have prescription drug insurance; Medicare A&B and [place]. Current pays out of pocket for medications at [facility] . Uncontrolled hyperlipidemia, complicated by Q1JH, HTN o Current antihyperlipidemic regimen: none o Previous antihyperlipidemic medications tried: simvastatin, atorvastatin, rosuvastatin, pravastatin, ezetimibe; muscle pains o Most recent lipid panel: LDL: 156, TG: 235, TC: 246, HDL: 21 o ASCVD risk enhancing conditions: age >74, DM, HTN, CKD, Family history: father passed from MI at age 87 o 10-year ASCVD risk score: 48% . Diabetes, uncontrolled, last A1c 7.0%. Reports diarrhea with metformin IR. Switched to metformin ER, but this prescription was set to No Print and never sent o Current regimen: metformin XR 500 mg BID, though is taking IR 500 mg QPM o Most recent eGFR ~64  Pharmacist Clinical Goal(s):  Marland Kitchen Over the next 90 days, patient will work with PharmD and providers towards optimized antihyperlipidemic therapy  Interventions: . Comprehensive medication review performed; medication list updated in electronic medical record.  Bertram Savin care team collaboration (see longitudinal plan of care) . Re-sent metformin ER (ensured order was set to Normal) to [facility] mail order per patient preference. Recommended she start w/ metformin 500 mg ER once daily, then after a week if no diarrhea, increase to BID. Continue as tolerated . Reviewed  PCSK9 therapy, benefits. Patient amenable to starting Repatha 140 mg Q2W. She does not have prescription drug insurance, so is eligible for assistance from [employer]. Will collaborate w/ [facility] to mail patient her portion of application. Patient will complete, sign, and mail back w/ proof of income (SSA + retirement). I will collaborate w/ Dr. Nicki Reaper for her signature  Patient Self Care Activities:  . Patient will focus on medication adherence   Initial goal documentation       Other   .  Follow up with Primary Care Provider      As needed       This is a list of the screening recommended for you and due dates:  Health Maintenance  Topic Date Due  . Complete foot exam   01/31/2020*  .  Hepatitis C: One time screening is recommended by Center for Disease Control  (CDC) for  adults born from [birth year] through 1966.   01/14/2021*  . Flu Shot  02/25/2020  . Hemoglobin A1C  03/27/2020  . Mammogram  05/16/2020  . Eye exam for diabetics  05/30/2020  . Tetanus Vaccine  12/29/2020  . DEXA scan (bone density measurement)  Completed  . COVID-19 Vaccine  Completed  . Pneumonia vaccines  Completed  *Topic was postponed. The date shown is not the original due date.   Immunizations Immunization History  Administered Date(s) Administered  . Fluad Quad(high Dose 65+) 05/31/2019  . Influenza Split 05/31/2012, 04/16/2014  . Influenza, High Dose Seasonal PF 03/25/2016, 04/23/2017, 05/09/2018  . Influenza-Unspecified 04/26/2012, 05/31/2012, 05/03/2013, 04/16/2014, 05/08/2015, 03/25/2016  . Moderna SARS-COVID-2 Vaccination 09/23/2019, 10/21/2019  .  Pneumococcal Conjugate-13 05/30/2013  . Pneumococcal Polysaccharide-23 12/12/2015  . Zoster 06/28/2012   Keep all routine maintenance appointments.   Next scheduled lab 01/25/20  Follow up 01/31/20 @ 10:00  CCM - 03/11/20  Advanced directives: yes  Conditions/risks identified: none  Follow up in one year for your annual wellness visit     Preventive Care 65 Years and Older, Female Preventive care refers to lifestyle choices and visits with your health care provider that can promote health and wellness. What does preventive care include?  A yearly physical exam. This is also called an annual well check.  Dental exams once or twice a year.  Routine eye exams. Ask your health care provider how often you should have your eyes checked.  Personal lifestyle choices, including:  Daily care of your teeth and gums.  Regular physical activity.  Eating a healthy diet.  Avoiding tobacco and drug use.  Limiting alcohol use.  Practicing safe sex.  Taking low-dose aspirin every day.  Taking vitamin and mineral supplements as recommended by your health care provider. What happens during an annual well check? The services and screenings done by your health care provider during your annual well check will depend on your age, overall health, lifestyle risk factors, and family history of disease. Counseling  Your health care provider may ask you questions about your:  Alcohol use.  Tobacco use.  Drug use.  Emotional well-being.  Home and relationship well-being.  Sexual activity.  Eating habits.  History of falls.  Memory and ability to understand (cognition).  Work and work Statistician.  Reproductive health. Screening  You may have the following tests or measurements:  Height, weight, and BMI.  Blood pressure.  Lipid and cholesterol levels. These may be checked every 5 years, or more frequently if you are over 63 years old.  Skin check.  Lung cancer screening. You may have this screening every year starting at age 37 if you have a 30-pack-year history of smoking and currently smoke or have quit within the past 15 years.  Fecal occult blood test (FOBT) of the stool. You may have this test every year starting at age 29.  Flexible sigmoidoscopy or colonoscopy. You may have a sigmoidoscopy every 5  years or a colonoscopy every 10 years starting at age 66.  Hepatitis C blood test.  Hepatitis B blood test.  Sexually transmitted disease (STD) testing.  Diabetes screening. This is done by checking your blood sugar (glucose) after you have not eaten for a while (fasting). You may have this done every 1-3 years.  Bone density scan. This is done to screen for osteoporosis. You may have this done starting at age 83.  Mammogram. This may be done every 1-2 years. Talk to your health care provider about how often you should have regular mammograms. Talk with your health care provider about your test results, treatment options, and if necessary, the need for more tests. Vaccines  Your health care provider may recommend certain vaccines, such as:  Influenza vaccine. This is recommended every year.  Tetanus, diphtheria, and acellular pertussis (Tdap, Td) vaccine. You may need a Td booster every 10 years.  Zoster vaccine. You may need this after age 66.  Pneumococcal 13-valent conjugate (PCV13) vaccine. One dose is recommended after age 63.  Pneumococcal polysaccharide (PPSV23) vaccine. One dose is recommended after age 6. Talk to your health care provider about which screenings and vaccines you need and how often you need them. This information is not intended to  replace advice given to you by your health care provider. Make sure you discuss any questions you have with your health care provider. Document Released: 08/09/2015 Document Revised: 04/01/2016 Document Reviewed: 05/14/2015 Elsevier Interactive Patient Education  2017 [place] Prevention in the Home Falls can cause injuries. They can happen to people of all ages. There are many things you can do to make your home safe and to help prevent falls. What can I do on the outside of my home?  Regularly fix the edges of walkways and driveways and fix any cracks.  Remove anything that might make you trip as you walk through  a door, such as a raised step or threshold.  Trim any bushes or trees on the path to your home.  Use bright outdoor lighting.  Clear any walking paths of anything that might make someone trip, such as rocks or tools.  Regularly check to see if handrails are loose or broken. Make sure that both sides of any steps have handrails.  Any raised decks and porches should have guardrails on the edges.  Have any leaves, snow, or ice cleared regularly.  Use sand or salt on walking paths during winter.  Clean up any spills in your garage right away. This includes oil or grease spills. What can I do in the bathroom?  Use night lights.  Install grab bars by the toilet and in the tub and shower. Do not use towel bars as grab bars.  Use non-skid mats or decals in the tub or shower.  If you need to sit down in the shower, use a plastic, non-slip stool.  Keep the floor dry. Clean up any water that spills on the floor as soon as it happens.  Remove soap buildup in the tub or shower regularly.  Attach bath mats securely with double-sided non-slip rug tape.  Do not have throw rugs and other things on the floor that can make you trip. What can I do in the bedroom?  Use night lights.  Make sure that you have a light by your bed that is easy to reach.  Do not use any sheets or blankets that are too big for your bed. They should not hang down onto the floor.  Have a firm chair that has side arms. You can use this for support while you get dressed.  Do not have throw rugs and other things on the floor that can make you trip. What can I do in the kitchen?  Clean up any spills right away.  Avoid walking on wet floors.  Keep items that you use a lot in easy-to-reach places.  If you need to reach something above you, use a strong step stool that has a grab bar.  Keep electrical cords out of the way.  Do not use floor polish or wax that makes floors slippery. If you must use wax, use  non-skid floor wax.  Do not have throw rugs and other things on the floor that can make you trip. What can I do with my stairs?  Do not leave any items on the stairs.  Make sure that there are handrails on both sides of the stairs and use them. Fix handrails that are broken or loose. Make sure that handrails are as long as the stairways.  Check any carpeting to make sure that it is firmly attached to the stairs. Fix any carpet that is loose or worn.  Avoid having throw rugs at the top or  bottom of the stairs. If you do have throw rugs, attach them to the floor with carpet tape.  Make sure that you have a light switch at the top of the stairs and the bottom of the stairs. If you do not have them, ask someone to add them for you. What else can I do to help prevent falls?  Wear shoes that:  Do not have high heels.  Have rubber bottoms.  Are comfortable and fit you well.  Are closed at the toe. Do not wear sandals.  If you use a stepladder:  Make sure that it is fully opened. Do not climb a closed stepladder.  Make sure that both sides of the stepladder are locked into place.  Ask someone to hold it for you, if possible.  Clearly mark and make sure that you can see:  Any grab bars or handrails.  First and last steps.  Where the edge of each step is.  Use tools that help you move around (mobility aids) if they are needed. These include:  Canes.  Walkers.  Scooters.  Crutches.  Turn on the lights when you go into a dark area. Replace any light bulbs as soon as they burn out.  Set up your furniture so you have a clear path. Avoid moving your furniture around.  If any of your floors are uneven, fix them.  If there are any pets around you, be aware of where they are.  Review your medicines with your doctor. Some medicines can make you feel dizzy. This can increase your chance of falling. Ask your doctor what other things that you can do to help prevent falls. This  information is not intended to replace advice given to you by your health care provider. Make sure you discuss any questions you have with your health care provider. Document Released: 05/09/2009 Document Revised: 12/19/2015 Document Reviewed: 08/17/2014 Elsevier Interactive Patient Education  2017 [employer].

## 2020-01-25 ENCOUNTER — Other Ambulatory Visit: Payer: Self-pay

## 2020-01-25 ENCOUNTER — Other Ambulatory Visit (INDEPENDENT_AMBULATORY_CARE_PROVIDER_SITE_OTHER): Payer: Medicare Other

## 2020-01-25 DIAGNOSIS — E1165 Type 2 diabetes mellitus with hyperglycemia: Secondary | ICD-10-CM | POA: Diagnosis not present

## 2020-01-25 DIAGNOSIS — E7800 Pure hypercholesterolemia, unspecified: Secondary | ICD-10-CM | POA: Diagnosis not present

## 2020-01-25 LAB — LIPID PANEL
Cholesterol: 219 mg/dL — ABNORMAL HIGH (ref 0–200)
HDL: 48.2 mg/dL (ref >39.00)
LDL Cholesterol: 136 mg/dL — ABNORMAL HIGH (ref 0–99)
NonHDL: 170.68
Total CHOL/HDL Ratio: 5
Triglycerides: 171 mg/dL — ABNORMAL HIGH (ref 0.0–149.0)
VLDL: 34.2 mg/dL (ref 0.0–40.0)

## 2020-01-25 LAB — BASIC METABOLIC PANEL
BUN: 16 mg/dL (ref 6–23)
CO2: 32 mEq/L (ref 19–32)
Calcium: 9.2 mg/dL (ref 8.4–10.5)
Chloride: 101 mEq/L (ref 96–112)
Creatinine, Ser: 0.89 mg/dL (ref 0.40–1.20)
GFR: 61.07 mL/min (ref >60.00)
Glucose, Bld: 130 mg/dL — ABNORMAL HIGH (ref 70–99)
Potassium: 4.3 mEq/L (ref 3.5–5.1)
Sodium: 141 mEq/L (ref 135–145)

## 2020-01-25 LAB — HEPATIC FUNCTION PANEL
ALT: 18 U/L (ref 0–35)
AST: 18 U/L (ref 0–37)
Albumin: 4.1 g/dL (ref 3.5–5.2)
Alkaline Phosphatase: 69 U/L (ref 39–117)
Bilirubin, Direct: 0.1 mg/dL (ref 0.0–0.3)
Total Bilirubin: 0.5 mg/dL (ref 0.2–1.2)
Total Protein: 6.4 g/dL (ref 6.0–8.3)

## 2020-01-25 LAB — HEMOGLOBIN A1C: Hgb A1c MFr Bld: 7 % — ABNORMAL HIGH (ref 4.6–6.5)

## 2020-01-26 NOTE — Addendum Note (Signed)
Addended by: Tor Netters I on: 01/26/2020 03:22 PM   Modules accepted: Orders

## 2020-01-31 ENCOUNTER — Other Ambulatory Visit: Payer: Self-pay

## 2020-01-31 ENCOUNTER — Encounter: Payer: Self-pay | Admitting: Internal Medicine

## 2020-01-31 ENCOUNTER — Ambulatory Visit (INDEPENDENT_AMBULATORY_CARE_PROVIDER_SITE_OTHER): Payer: Medicare Other | Admitting: Internal Medicine

## 2020-01-31 DIAGNOSIS — K219 Gastro-esophageal reflux disease without esophagitis: Secondary | ICD-10-CM

## 2020-01-31 DIAGNOSIS — E1165 Type 2 diabetes mellitus with hyperglycemia: Secondary | ICD-10-CM | POA: Diagnosis not present

## 2020-01-31 DIAGNOSIS — I700 Atherosclerosis of aorta: Secondary | ICD-10-CM

## 2020-01-31 DIAGNOSIS — M5441 Lumbago with sciatica, right side: Secondary | ICD-10-CM | POA: Diagnosis not present

## 2020-01-31 DIAGNOSIS — R519 Headache, unspecified: Secondary | ICD-10-CM

## 2020-01-31 DIAGNOSIS — Z86010 Personal history of colonic polyps: Secondary | ICD-10-CM | POA: Diagnosis not present

## 2020-01-31 DIAGNOSIS — E7800 Pure hypercholesterolemia, unspecified: Secondary | ICD-10-CM

## 2020-01-31 DIAGNOSIS — E538 Deficiency of other specified B group vitamins: Secondary | ICD-10-CM | POA: Diagnosis not present

## 2020-01-31 DIAGNOSIS — I10 Essential (primary) hypertension: Secondary | ICD-10-CM

## 2020-01-31 LAB — C-REACTIVE PROTEIN: CRP: <1 mg/dL (ref 0.5–20.0)

## 2020-01-31 LAB — VITAMIN B12: Vitamin B-12: 448 pg/mL (ref 211–911)

## 2020-01-31 LAB — HM DIABETES FOOT EXAM

## 2020-01-31 LAB — SEDIMENTATION RATE: Sed Rate: 9 mm/hr (ref 0–30)

## 2020-01-31 NOTE — Patient Instructions (Signed)
nasacort nasal spray - 2 sprays each nostril one time per day.  Do this in the evening.   

## 2020-01-31 NOTE — Progress Notes (Signed)
Patient ID: Misty Jones, female   DOB: 10-03-1940, 79 y.o.   MRN: [id number]   Subjective:    Patient ID: Misty Jones, female    DOB: 13-Aug-1940, 79 y.o.   MRN: [id number]  HPI This visit occurred during the SARS-CoV-2 public health emergency.  Safety protocols were in place, including screening questions prior to the visit, additional usage of staff PPE, and extensive cleaning of exam room while observing appropriate contact time as indicated for disinfecting solutions.  Patient here for a scheduled follow up.  She reports she is doing relatively well.  Seeing eye MD for f/u macular degeneration.  Injections - f/u 02/16/20.  Has noticd some increased headache.  Feels may be related to sinus.  Questioning if related to her eyes.  States saline nasal spray helps.  Some am clear drainage.  Discussed using nasacort at night.  Taking preservision.  No chest pain or sob.  No chest congestion or cough.  No abdominal pain.  Bowels moving.  Some left low back pain.  No known injury.  Desires not to take repatha.  On ER metformin.  Bowels are better.  Discussed labs.  a1c 7.0.  Discussed further treatment for her sugars.  She declines.  Feels she can get down with diet adjustment.  Has been eating a lot of fruit.  Some numbness - lower extremity.   Past Medical History:  Diagnosis Date  . B12 deficiency   . Chicken pox   . Diabetes mellitus ([place])   . GERD (gastroesophageal reflux disease)   . History of diverticulitis of colon   . Hypercholesterolemia   . Hypertension   . Nasal drainage    Past Surgical History:  Procedure Laterality Date  . ABDOMINAL HYSTERECTOMY  1984  . BACK SURGERY  1986   ruptured disc, Dr Hardin [facility])  . CHOLECYSTECTOMY  1991  . CHOLECYSTECTOMY  1992  . COLONOSCOPY WITH PROPOFOL N/A 06/29/2016   Procedure: COLONOSCOPY WITH PROPOFOL;  Surgeon: Manya Silvas, MD;  Location: [facility] ENDOSCOPY;  Service: Endoscopy;  Laterality: N/A;  . ESOPHAGOGASTRODUODENOSCOPY  (EGD) WITH PROPOFOL N/A 12/13/2017   Procedure: ESOPHAGOGASTRODUODENOSCOPY (EGD) WITH PROPOFOL;  Surgeon: Manya Silvas, MD;  Location: [facility] ENDOSCOPY;  Service: Endoscopy;  Laterality: N/A;  . TONSILLECTOMY     Family History  Problem Relation Age of Onset  . Hypertension Mother   . Cancer Mother        Mouth cancer  . Heart disease Father        myocardial infarction  . Raynaud syndrome Sister   . Asthma Sister   . Congenital heart disease Sister   . Thyroid disease Sister   . Thyroid cancer Daughter   . Breast cancer Neg Hx   . Colon cancer Neg Hx    Social History   Socioeconomic History  . Marital status: Divorced    Spouse name: Not on file  . Number of children: 2  . Years of education: Not on file  . Highest education level: Not on file  Occupational History  . Occupation: retired  Tobacco Use  . Smoking status: Former Smoker    Packs/day: 0.50    Years: 20.00    Pack years: 10.00    Quit date: 07/26/1984    Years since quitting: 35.5  . Smokeless tobacco: Never Used  . Tobacco comment: quit smoking years ago  Vaping Use  . Vaping Use: Never used  Substance and Sexual Activity  . Alcohol use: No  Alcohol/week: 0.0 standard drinks  . Drug use: No  . Sexual activity: Never  Other Topics Concern  . Not on file  Social History Narrative  . Not on file   Social Determinants of Health   Financial Resource Strain:   . Difficulty of Paying Living Expenses:   Food Insecurity:   . Worried About Charity fundraiser in the Last Year:   . Arboriculturist in the Last Year:   Transportation Needs:   . Film/video editor (Medical):   Marland Kitchen Lack of Transportation (Non-Medical):   Physical Activity:   . Days of Exercise per Week:   . Minutes of Exercise per Session:   Stress:   . Feeling of Stress :   Social Connections:   . Frequency of Communication with Friends and Family:   . Frequency of Social Gatherings with Friends and Family:   . Attends  Religious Services:   . Active Member of Clubs or Organizations:   . Attends Archivist Meetings:   Marland Kitchen Marital Status:     Outpatient Encounter Medications as of 01/31/2020  Medication Sig  . aspirin 81 MG tablet Take 81 mg by mouth daily.  . benazepril (LOTENSIN) 40 MG tablet Take 1 tablet (40 mg total) by mouth daily.  . Calcium Carbonate-Vitamin D (CALCIUM 600+D) 600-400 MG-UNIT per tablet Take 1 tablet by mouth 2 (two) times daily.  . Cholecalciferol (VITAMIN D) 2000 UNITS tablet Take 1,000 Units by mouth daily.   . cyanocobalamin (,VITAMIN B-12,) 1000 MCG/ML injection INJECT 1 ML (CC) INTRAMUSCULARLY ONCE EVERY MONTH  . Evolocumab (REPATHA SURECLICK) 676 MG/ML SOAJ Inject 140 mg into the skin every 14 (fourteen) days.  Marland Kitchen glucose blood test strip USE ONCE DAILY TO CHECK BLOOD SUGARS  . hydrochlorothiazide (HYDRODIURIL) 25 MG tablet Take 1/2 (one-half) tablet by mouth once daily  . Lancets MISC Check sugars once a day Dx: E11.9 (Contour Next)  . Melatonin 10 MG TABS Take by mouth.  . niacin 500 MG tablet Take 500 mg by mouth at bedtime.   Marland Kitchen nystatin cream (MYCOSTATIN) Apply 1 application topically 2 (two) times daily. (Patient not taking: Reported on 11/09/2019)  . omeprazole (PRILOSEC) 20 MG capsule Take 20 mg by mouth daily.  . [DISCONTINUED] metFORMIN (GLUCOPHAGE XR) 500 MG 24 hr tablet Take 1 tablet (500 mg total) by mouth 2 (two) times daily.   No facility-administered encounter medications on file as of 01/31/2020.    Review of Systems  Constitutional: Negative for appetite change and unexpected weight change.  HENT: Positive for congestion and postnasal drip.   Respiratory: Negative for cough, chest tightness and shortness of breath.   Cardiovascular: Negative for chest pain, palpitations and leg swelling.  Gastrointestinal: Negative for abdominal pain, diarrhea, nausea and vomiting.  Genitourinary: Negative for difficulty urinating and dysuria.  Musculoskeletal:  Positive for back pain. Negative for joint swelling and myalgias.  Skin: Negative for color change and rash.  Neurological: Positive for headaches. Negative for dizziness and light-headedness.  Psychiatric/Behavioral: Negative for agitation and dysphoric mood.       Objective:    Physical Exam Vitals reviewed.  Constitutional:      General: She is not in acute distress.    Appearance: Normal appearance.  HENT:     Head: Normocephalic and atraumatic.     Right Ear: External ear normal.     Left Ear: External ear normal.  Eyes:     General: No scleral icterus.  Right eye: No discharge.        Left eye: No discharge.     Conjunctiva/sclera: Conjunctivae normal.  Neck:     Thyroid: No thyromegaly.  Cardiovascular:     Rate and Rhythm: Normal rate and regular rhythm.  Pulmonary:     Effort: No respiratory distress.     Breath sounds: Normal breath sounds. No wheezing.  Abdominal:     General: Bowel sounds are normal.     Palpations: Abdomen is soft.     Tenderness: There is no abdominal tenderness.  Musculoskeletal:        General: No swelling or tenderness.     Cervical back: Neck supple. No tenderness.  Lymphadenopathy:     Cervical: No cervical adenopathy.  Skin:    Findings: No erythema or rash.  Neurological:     Mental Status: She is alert.  Psychiatric:        Mood and Affect: Mood normal.        Behavior: Behavior normal.     BP 136/76   Pulse 90   Temp 97.9 F (36.6 C)   Resp 16   Ht 5' 4"  (1.626 m)   Wt 207 lb (93.9 kg)   SpO2 99%   BMI 35.53 kg/m  Wt Readings from Last 3 Encounters:  01/31/20 207 lb (93.9 kg)  01/15/20 205 lb (93 kg)  09/28/19 205 lb (93 kg)     Lab Results  Component Value Date   WBC 8.2 05/30/2019   HGB 13.2 05/30/2019   HCT 40.8 05/30/2019   PLT 327.0 05/30/2019   GLUCOSE 130 (H) 01/25/2020   CHOL 219 (H) 01/25/2020   TRIG 171.0 (H) 01/25/2020   HDL 48.20 01/25/2020   LDLDIRECT 156.0 09/25/2019   LDLCALC 136  (H) 01/25/2020   ALT 18 01/25/2020   AST 18 01/25/2020   NA 141 01/25/2020   K 4.3 01/25/2020   CL 101 01/25/2020   CREATININE 0.89 01/25/2020   BUN 16 01/25/2020   CO2 32 01/25/2020   TSH 3.05 05/30/2019   INR 0.9 04/03/2012   HGBA1C 7.0 (H) 01/25/2020   MICROALBUR 1.0 08/18/2019    MM 3D SCREEN BREAST BILATERAL  Result Date: 05/17/2019 CLINICAL DATA:  Screening. EXAM: DIGITAL SCREENING BILATERAL MAMMOGRAM WITH TOMO AND CAD COMPARISON:  Previous exam(s). ACR Breast Density Category b: There are scattered areas of fibroglandular density. FINDINGS: There are no findings suspicious for malignancy. Images were processed with CAD. IMPRESSION: No mammographic evidence of malignancy. A result letter of this screening mammogram will be mailed directly to the patient. RECOMMENDATION: Screening mammogram in one year. (Code:SM-B-01Y) BI-RADS CATEGORY  1: Negative. Electronically Signed   By: Ammie Ferrier M.D.   On: 05/17/2019 14:53       Assessment & Plan:   Problem List Items Addressed This Visit    Aortic atherosclerosis ([place])    Intolerant to statin medication and zetia.  Wants to hold on repatha.        B12 deficiency    Recheck b12 level.        Relevant Orders   Vitamin B12 (Completed)   Diabetes mellitus ([place])    Low carb diet and exercise.  Extended release metformin.  Bowels better.  Discussed further treatment.  a1c 7.0. wants to hold on additional medication.  Wants to work on diet.  Has been eating a lot of fruit.  Follow.        Relevant Orders   Hemoglobin A1c  Basic metabolic panel   GERD (gastroesophageal reflux disease)    Upper symptoms controlled.  On omeprazole.        Headache    Headache as outlined.  Discussed further w/up and evaluation.  Hold on scanning.  Continue saline nasal spray and start nasacort nasal spray.  Check esr and crp.        Relevant Orders   Sedimentation rate (Completed)   C-reactive protein (Completed)   History of colonic  polyps    Colonoscopy 06/2016 - one polyp in the cecum, one polyp in the ascending colon and two rectal polyps (tubular adenomas).  Recommended f/u in 5 years.        Hypercholesterolemia    Has tried multiple statins - intolerant.  zetia - leg cramping.  Discussed repatha.  She declines to try at this time.        Relevant Orders   Hepatic function panel   Lipid panel   Hypertension    Blood pressure under good control.  Continue current mediation regimen - benazepril, hctz.  Follow pressures.  Follow metabolic panel.       Relevant Orders   CBC with Differential/Platelet   TSH   Low back pain    Desires to hold on any further intervention at this time.            [name], MD

## 2020-02-01 ENCOUNTER — Other Ambulatory Visit: Payer: Self-pay

## 2020-02-01 ENCOUNTER — Encounter: Payer: Self-pay | Admitting: Internal Medicine

## 2020-02-01 DIAGNOSIS — E1165 Type 2 diabetes mellitus with hyperglycemia: Secondary | ICD-10-CM

## 2020-02-01 MED ORDER — METFORMIN HCL ER 500 MG PO TB24: 500.0000 mg | ORAL_TABLET | Freq: Two times a day (BID) | ORAL | 1 refills | Status: DC

## 2020-02-04 ENCOUNTER — Encounter: Payer: Self-pay | Admitting: Internal Medicine

## 2020-02-04 NOTE — Assessment & Plan Note (Signed)
Headache as outlined.  Discussed further w/up and evaluation.  Hold on scanning.  Continue saline nasal spray and start nasacort nasal spray.  Check esr and crp.

## 2020-02-04 NOTE — Assessment & Plan Note (Signed)
Recheck b12 level.  

## 2020-02-04 NOTE — Assessment & Plan Note (Signed)
Blood pressure under good control.  Continue current mediation regimen - benazepril, hctz.  Follow pressures.  Follow metabolic panel.

## 2020-02-04 NOTE — Assessment & Plan Note (Signed)
Colonoscopy 06/2016 - one polyp in the cecum, one polyp in the ascending colon and two rectal polyps (tubular adenomas).  Recommended f/u in 5 years.

## 2020-02-04 NOTE — Assessment & Plan Note (Signed)
Intolerant to statin medication and zetia.  Wants to hold on repatha.

## 2020-02-04 NOTE — Assessment & Plan Note (Signed)
Low carb diet and exercise.  Extended release metformin.  Bowels better.  Discussed further treatment.  a1c 7.0. wants to hold on additional medication.  Wants to work on diet.  Has been eating a lot of fruit.  Follow.

## 2020-02-04 NOTE — Assessment & Plan Note (Signed)
Desires to hold on any further intervention at this time.

## 2020-02-04 NOTE — Assessment & Plan Note (Signed)
Has tried multiple statins - intolerant.  zetia - leg cramping.  Discussed repatha.  She declines to try at this time.

## 2020-02-04 NOTE — Assessment & Plan Note (Signed)
Upper symptoms controlled.  On omeprazole.   

## 2020-02-14 ENCOUNTER — Other Ambulatory Visit: Payer: Self-pay | Admitting: Internal Medicine

## 2020-02-14 DIAGNOSIS — Z1231 Encounter for screening mammogram for malignant neoplasm of breast: Secondary | ICD-10-CM

## 2020-02-16 DIAGNOSIS — H353211 Exudative age-related macular degeneration, right eye, with active choroidal neovascularization: Secondary | ICD-10-CM | POA: Diagnosis not present

## 2020-03-04 ENCOUNTER — Encounter: Payer: Self-pay | Admitting: Internal Medicine

## 2020-03-04 DIAGNOSIS — E1165 Type 2 diabetes mellitus with hyperglycemia: Secondary | ICD-10-CM

## 2020-03-04 MED ORDER — METFORMIN HCL ER 500 MG PO TB24: 500.0000 mg | ORAL_TABLET | Freq: Two times a day (BID) | ORAL | 1 refills | Status: DC

## 2020-03-07 ENCOUNTER — Other Ambulatory Visit: Payer: Self-pay | Admitting: Internal Medicine

## 2020-03-11 ENCOUNTER — Ambulatory Visit: Payer: Medicare Other | Admitting: Pharmacist

## 2020-03-11 DIAGNOSIS — G720 Drug-induced myopathy: Secondary | ICD-10-CM

## 2020-03-11 DIAGNOSIS — E1165 Type 2 diabetes mellitus with hyperglycemia: Secondary | ICD-10-CM

## 2020-03-11 DIAGNOSIS — E7800 Pure hypercholesterolemia, unspecified: Secondary | ICD-10-CM

## 2020-03-11 DIAGNOSIS — T466X5A Adverse effect of antihyperlipidemic and antiarteriosclerotic drugs, initial encounter: Secondary | ICD-10-CM

## 2020-03-11 NOTE — Chronic Care Management (AMB) (Signed)
Chronic Care Management   Follow Up Note   03/11/2020 Name: Misty Jones MRN: [id number] DOB: February 18, 1941  Referred by: [name], MD Reason for referral : Chronic Care Management (Medication Management)   Misty Jones is a 79 y.o. year old female who is a primary care patient of [name], MD. The CCM team was consulted for assistance with chronic disease management and care coordination needs.    Review of patient status, including review of consultants reports, relevant laboratory and other test results, and collaboration with appropriate care team members and the patient's provider was performed as part of comprehensive patient evaluation and provision of chronic care management services.    SDOH (Social Determinants of Health) assessments performed: Yes See Care Plan activities for detailed interventions related to SDOH)  SDOH Interventions     Most Recent Value  SDOH Interventions  Financial Strain Interventions Intervention Not Indicated       Outpatient Encounter Medications as of 03/11/2020  Medication Sig  . aspirin 81 MG tablet Take 81 mg by mouth daily.  . benazepril (LOTENSIN) 40 MG tablet Take 1 tablet by mouth once daily  . Calcium Carbonate-Vitamin D (CALCIUM 600+D) 600-400 MG-UNIT per tablet Take 1 tablet by mouth 2 (two) times daily.  . Cholecalciferol (VITAMIN D) 2000 UNITS tablet Take 1,000 Units by mouth daily.   . cyanocobalamin (,VITAMIN B-12,) 1000 MCG/ML injection INJECT 1 ML (CC) INTRAMUSCULARLY ONCE EVERY MONTH  . glucose blood test strip USE ONCE DAILY TO CHECK BLOOD SUGARS  . hydrochlorothiazide (HYDRODIURIL) 25 MG tablet Take 1/2 (one-half) tablet by mouth once daily  . Lancets MISC Check sugars once a day Dx: E11.9 (Contour Next)  . Melatonin 10 MG TABS Take by mouth.  . metFORMIN (GLUCOPHAGE XR) 500 MG 24 hr tablet Take 1 tablet (500 mg total) by mouth 2 (two) times daily.  . Multiple Vitamins-Minerals (PRESERVISION AREDS PO) Take 2  capsules by mouth daily.  . niacin 500 MG tablet Take 500 mg by mouth at bedtime.   Marland Kitchen omeprazole (PRILOSEC) 20 MG capsule Take 20 mg by mouth daily.  Marland Kitchen nystatin cream (MYCOSTATIN) Apply 1 application topically 2 (two) times daily. (Patient not taking: Reported on 11/09/2019)  . [DISCONTINUED] Evolocumab (REPATHA SURECLICK) 502 MG/ML SOAJ Inject 140 mg into the skin every 14 (fourteen) days.   No facility-administered encounter medications on file as of 03/11/2020.     Objective:   Goals Addressed              This Visit's Progress     Patient Stated   .  COMPLETED: PharmD "I want to be on the best medication possible" (pt-stated)        CARE PLAN ENTRY (see longtitudinal plan of care for additional care plan information)  Current Barriers:  . Social, community, and financial barriers:  o Museum/gallery curator barriers: patient does not have prescription drug insurance; Medicare A&B and [place]. Current pays out of pocket for medications at [facility] . Uncontrolled hyperlipidemia, complicated by D7AJ, HTN. Discussed and recommended Repatha, but patient declined to initiate.  o Current antihyperlipidemic regimen: none o Previous antihyperlipidemic medications tried: simvastatin, atorvastatin, rosuvastatin, pravastatin, ezetimibe; muscle pains o Most recent lipid panel: TC 219, TG 171, HDL 428 LDL 136 o ASCVD risk enhancing conditions: age >40, DM, HTN, CKD, Family history: father passed from MI at age 61 o 10-year ASCVD risk score: 54% . Diabetes, uncontrolled, last A1c 7.0%.  o Current regimen: metformin XR 500 mg  BID o Most recent eGFR ~64 o Reports post supper readings ~110-120s.  o Nephropathy screening: up to date o Eye exam: up to date . HTN: benazepril 40 mg daily, HCTZ 12.5 mg daily. BP well controlled w/ SBP <140 at last appt  Pharmacist Clinical Goal(s):  Marland Kitchen Over the next 90 days, patient will work with PharmD and providers towards optimized antihyperlipidemic  therapy  Interventions: . Comprehensive medication review performed; medication list updated in electronic medical record.  Bertram Savin care team collaboration (see longitudinal plan of care) . Reviewed goal A1c, goal fasting, and goal 2 hour post prandial.  . Patient declines further discussion regarding lipid management at this time. Encouraged her to contact us if she changes her mind.  Patient Self Care Activities:  . Patient will focus on medication adherence   Please see past updates related to this goal by clicking on the "Past Updates" button in the selected goal          Plan:  - Closing CCM case. Patient has my contact information for future questions or concerns.   Catie Darnelle Maffucci, PharmD, [place], CPP Clinical Pharmacist [place] [phone]

## 2020-03-11 NOTE — Patient Instructions (Signed)
Visit Information  Goals Addressed              This Visit's Progress     Patient Stated   .  COMPLETED: PharmD "I want to be on the best medication possible" (pt-stated)        CARE PLAN ENTRY (see longtitudinal plan of care for additional care plan information)  Current Barriers:  . Social, community, and financial barriers:  o Museum/gallery curator barriers: patient does not have prescription drug insurance; Medicare A&B and [place]. Current pays out of pocket for medications at [facility] . Uncontrolled hyperlipidemia, complicated by D4PT, HTN. Discussed and recommended Repatha, but patient declined to initiate.  o Current antihyperlipidemic regimen: none o Previous antihyperlipidemic medications tried: simvastatin, atorvastatin, rosuvastatin, pravastatin, ezetimibe; muscle pains o Most recent lipid panel: TC 219, TG 171, HDL 428 LDL 136 o ASCVD risk enhancing conditions: age >65, DM, HTN, CKD, Family history: father passed from MI at age 45 o 10-year ASCVD risk score: 54% . Diabetes, uncontrolled, last A1c 7.0%.  o Current regimen: metformin XR 500 mg BID o Most recent eGFR ~64 o Reports post supper readings ~110-120s.  o Nephropathy screening: up to date o Eye exam: up to date . HTN: benazepril 40 mg daily, HCTZ 12.5 mg daily. BP well controlled w/ SBP <140 at last appt  Pharmacist Clinical Goal(s):  Marland Kitchen Over the next 90 days, patient will work with PharmD and providers towards optimized antihyperlipidemic therapy  Interventions: . Comprehensive medication review performed; medication list updated in electronic medical record.  Bertram Savin care team collaboration (see longitudinal plan of care) . Reviewed goal A1c, goal fasting, and goal 2 hour post prandial.  . Patient declines further discussion regarding lipid management at this time. Encouraged her to contact us if she changes her mind.  Patient Self Care Activities:  . Patient will focus on medication  adherence   Please see past updates related to this goal by clicking on the "Past Updates" button in the selected goal         The patient verbalized understanding of instructions provided today and declined a print copy of patient instruction materials.    Plan:  - Closing CCM case. Patient has my contact information for future questions or concerns.   Catie Darnelle Maffucci, PharmD, [place], CPP Clinical Pharmacist [place] [phone]

## 2020-03-12 NOTE — Progress Notes (Signed)
I have reviewed the above note and agree. I was available to the pharmacist for consultation.  Shervon Kerwin, MD 

## 2020-03-15 DIAGNOSIS — H353211 Exudative age-related macular degeneration, right eye, with active choroidal neovascularization: Secondary | ICD-10-CM | POA: Diagnosis not present

## 2020-04-03 ENCOUNTER — Encounter: Payer: Self-pay | Admitting: Internal Medicine

## 2020-04-12 DIAGNOSIS — H353211 Exudative age-related macular degeneration, right eye, with active choroidal neovascularization: Secondary | ICD-10-CM | POA: Diagnosis not present

## 2020-05-17 ENCOUNTER — Ambulatory Visit
Admission: RE | Admit: 2020-05-17 | Discharge: 2020-05-17 | Disposition: A | Discharge status: Home / Self Care | Payer: Medicare Other | Source: Ambulatory Visit | Attending: Internal Medicine | Admitting: Internal Medicine

## 2020-05-17 ENCOUNTER — Other Ambulatory Visit: Payer: Self-pay

## 2020-05-17 DIAGNOSIS — Z1231 Encounter for screening mammogram for malignant neoplasm of breast: Secondary | ICD-10-CM | POA: Insufficient documentation

## 2020-05-30 ENCOUNTER — Other Ambulatory Visit (INDEPENDENT_AMBULATORY_CARE_PROVIDER_SITE_OTHER): Payer: Medicare Other

## 2020-05-30 ENCOUNTER — Other Ambulatory Visit: Payer: Self-pay

## 2020-05-30 DIAGNOSIS — I10 Essential (primary) hypertension: Secondary | ICD-10-CM

## 2020-05-30 DIAGNOSIS — E1165 Type 2 diabetes mellitus with hyperglycemia: Secondary | ICD-10-CM

## 2020-05-30 DIAGNOSIS — Z23 Encounter for immunization: Secondary | ICD-10-CM | POA: Diagnosis not present

## 2020-05-30 DIAGNOSIS — E7800 Pure hypercholesterolemia, unspecified: Secondary | ICD-10-CM

## 2020-05-30 LAB — HEPATIC FUNCTION PANEL
ALT: 15 U/L (ref 0–35)
AST: 14 U/L (ref 0–37)
Albumin: 4.1 g/dL (ref 3.5–5.2)
Alkaline Phosphatase: 62 U/L (ref 39–117)
Bilirubin, Direct: 0.1 mg/dL (ref 0.0–0.3)
Total Bilirubin: 0.6 mg/dL (ref 0.2–1.2)
Total Protein: 6.2 g/dL (ref 6.0–8.3)

## 2020-05-30 LAB — BASIC METABOLIC PANEL
BUN: 19 mg/dL (ref 6–23)
CO2: 34 mEq/L — ABNORMAL HIGH (ref 19–32)
Calcium: 9.9 mg/dL (ref 8.4–10.5)
Chloride: 97 mEq/L (ref 96–112)
Creatinine, Ser: 0.86 mg/dL (ref 0.40–1.20)
GFR: 63.96 mL/min (ref >60.00)
Glucose, Bld: 115 mg/dL — ABNORMAL HIGH (ref 70–99)
Potassium: 4.8 mEq/L (ref 3.5–5.1)
Sodium: 139 mEq/L (ref 135–145)

## 2020-05-30 LAB — CBC WITH DIFFERENTIAL/PLATELET
Basophils Absolute: 0.1 10*3/uL (ref 0.0–0.1)
Basophils Relative: 0.6 % (ref 0.0–3.0)
Eosinophils Absolute: 0.2 10*3/uL (ref 0.0–0.7)
Eosinophils Relative: 2 % (ref 0.0–5.0)
HCT: 39.4 % (ref 36.0–46.0)
Hemoglobin: 13.2 g/dL (ref 12.0–15.0)
Lymphocytes Relative: 32.6 % (ref 12.0–46.0)
Lymphs Abs: 2.8 10*3/uL (ref 0.7–4.0)
MCHC: 33.4 g/dL (ref 30.0–36.0)
MCV: 90.5 fl (ref 78.0–100.0)
Monocytes Absolute: 0.6 10*3/uL (ref 0.1–1.0)
Monocytes Relative: 6.5 % (ref 3.0–12.0)
Neutro Abs: 5 10*3/uL (ref 1.4–7.7)
Neutrophils Relative %: 58.3 % (ref 43.0–77.0)
Platelets: 334 10*3/uL (ref 150.0–400.0)
RBC: 4.36 Mil/uL (ref 3.87–5.11)
RDW: 14.1 % (ref 11.5–15.5)
WBC: 8.6 10*3/uL (ref 4.0–10.5)

## 2020-05-30 LAB — HEMOGLOBIN A1C: Hgb A1c MFr Bld: 7.1 % — ABNORMAL HIGH (ref 4.6–6.5)

## 2020-05-30 LAB — LIPID PANEL
Cholesterol: 258 mg/dL — ABNORMAL HIGH (ref 0–200)
HDL: 54 mg/dL (ref >39.00)
NonHDL: 204.28
Total CHOL/HDL Ratio: 5
Triglycerides: 215 mg/dL — ABNORMAL HIGH (ref 0.0–149.0)
VLDL: 43 mg/dL — ABNORMAL HIGH (ref 0.0–40.0)

## 2020-05-30 LAB — MICROALBUMIN / CREATININE URINE RATIO
Creatinine,U: 111.3 mg/dL
Microalb Creat Ratio: 0.8 mg/g (ref 0.0–30.0)
Microalb, Ur: 0.8 mg/dL (ref 0.0–1.9)

## 2020-05-30 LAB — TSH: TSH: 5.27 u[IU]/mL — ABNORMAL HIGH (ref 0.35–4.50)

## 2020-05-30 LAB — LDL CHOLESTEROL, DIRECT: Direct LDL: 171 mg/dL

## 2020-05-31 ENCOUNTER — Other Ambulatory Visit: Payer: Medicare Other

## 2020-05-31 ENCOUNTER — Other Ambulatory Visit: Payer: Self-pay | Admitting: Internal Medicine

## 2020-06-03 ENCOUNTER — Encounter: Payer: Medicare Other | Admitting: Internal Medicine

## 2020-06-05 ENCOUNTER — Ambulatory Visit (INDEPENDENT_AMBULATORY_CARE_PROVIDER_SITE_OTHER): Payer: Medicare Other

## 2020-06-05 ENCOUNTER — Ambulatory Visit (INDEPENDENT_AMBULATORY_CARE_PROVIDER_SITE_OTHER): Payer: Medicare Other | Admitting: Internal Medicine

## 2020-06-05 ENCOUNTER — Other Ambulatory Visit: Payer: Self-pay

## 2020-06-05 VITALS — BP 128/76 | HR 90 | Temp 98.1°F | Resp 16 | Ht 64.0 in | Wt 204.8 lb

## 2020-06-05 DIAGNOSIS — I700 Atherosclerosis of aorta: Secondary | ICD-10-CM | POA: Diagnosis not present

## 2020-06-05 DIAGNOSIS — M25511 Pain in right shoulder: Secondary | ICD-10-CM

## 2020-06-05 DIAGNOSIS — M19011 Primary osteoarthritis, right shoulder: Secondary | ICD-10-CM | POA: Diagnosis not present

## 2020-06-05 DIAGNOSIS — M25561 Pain in right knee: Secondary | ICD-10-CM

## 2020-06-05 DIAGNOSIS — I10 Essential (primary) hypertension: Secondary | ICD-10-CM | POA: Diagnosis not present

## 2020-06-05 DIAGNOSIS — R351 Nocturia: Secondary | ICD-10-CM

## 2020-06-05 DIAGNOSIS — D71 Functional disorders of polymorphonuclear neutrophils: Secondary | ICD-10-CM | POA: Diagnosis not present

## 2020-06-05 DIAGNOSIS — E1165 Type 2 diabetes mellitus with hyperglycemia: Secondary | ICD-10-CM | POA: Diagnosis not present

## 2020-06-05 DIAGNOSIS — M898X1 Other specified disorders of bone, shoulder: Secondary | ICD-10-CM | POA: Diagnosis not present

## 2020-06-05 DIAGNOSIS — E7800 Pure hypercholesterolemia, unspecified: Secondary | ICD-10-CM | POA: Diagnosis not present

## 2020-06-05 DIAGNOSIS — M25461 Effusion, right knee: Secondary | ICD-10-CM | POA: Diagnosis not present

## 2020-06-05 DIAGNOSIS — Z0000 Encounter for general adult medical examination without abnormal findings: Secondary | ICD-10-CM

## 2020-06-05 DIAGNOSIS — M1711 Unilateral primary osteoarthritis, right knee: Secondary | ICD-10-CM | POA: Diagnosis not present

## 2020-06-05 LAB — URINALYSIS, ROUTINE W REFLEX MICROSCOPIC
Bilirubin Urine: NEGATIVE
Hgb urine dipstick: NEGATIVE
Ketones, ur: NEGATIVE
Nitrite: NEGATIVE
RBC / HPF: NONE SEEN (ref 0–?)
Specific Gravity, Urine: 1.025 (ref 1.000–1.030)
Total Protein, Urine: NEGATIVE
Urine Glucose: NEGATIVE
Urobilinogen, UA: 0.2 (ref 0.0–1.0)
pH: 5.5 (ref 5.0–8.0)

## 2020-06-05 MED ORDER — METFORMIN HCL 500 MG PO TABS: ORAL_TABLET | ORAL | 1 refills | Status: DC

## 2020-06-05 NOTE — Progress Notes (Signed)
Patient ID: Misty Jones, female   DOB: Jul 31, 1940, 79 y.o.   MRN: [id number]   Subjective:    Patient ID: Misty Jones, female    DOB: 29-Jun-1941, 79 y.o.   MRN: [id number]  HPI This visit occurred during the SARS-CoV-2 public health emergency.  Safety protocols were in place, including screening questions prior to the visit, additional usage of staff PPE, and extensive cleaning of exam room while observing appropriate contact time as indicated for disinfecting solutions.  Patient with pat history of diabetes, hypertension and hypercholesterolemia.  She comes in today to follow up on these issues as well as for a complete physical exam.  She reports she is doing relatively well.  Previously had placed her on metformin XR - due to bowel issues.  She wants to change back to regular metformin, because she believes this is contributing to increased nocturia.  May go 6-10 x/night.  No chest pain or sob reported.  No abdominal pain.  No cough or congestion.  Does report increased right knee pain - medial aspect. Also right shoulder and right shoulder blade pain. Persistent.  No known injury or trauma.  Bowels stable. Discussed labs.  Discussed diet and exercise.     Past Medical History:  Diagnosis Date  . B12 deficiency   . Chicken pox   . Diabetes mellitus ([place])   . GERD (gastroesophageal reflux disease)   . History of diverticulitis of colon   . Hypercholesterolemia   . Hypertension   . Nasal drainage    Past Surgical History:  Procedure Laterality Date  . ABDOMINAL HYSTERECTOMY  1984  . BACK SURGERY  1986   ruptured disc, Dr Hardin Negus [facility])  . CHOLECYSTECTOMY  1991  . CHOLECYSTECTOMY  1992  . COLONOSCOPY WITH PROPOFOL N/A 06/29/2016   Procedure: COLONOSCOPY WITH PROPOFOL;  Surgeon: Manya Silvas, MD;  Location[facility] ENDOSCOPY;  Service: Endoscopy;  Laterality: N/A;  . ESOPHAGOGASTRODUODENOSCOPY (EGD) WITH PROPOFOL N/A 12/13/2017   Procedure: ESOPHAGOGASTRODUODENOSCOPY (EGD)  WITH PROPOFOL;  Surgeon: Manya Silvas, MD;  Location: [facility] ENDOSCOPY;  Service: Endoscopy;  Laterality: N/A;  . TONSILLECTOMY     Family History  Problem Relation Age of Onset  . Hypertension Mother   . Cancer Mother        Mouth cancer  . Heart disease Father        myocardial infarction  . Raynaud syndrome Sister   . Asthma Sister   . Congenital heart disease Sister   . Thyroid disease Sister   . Thyroid cancer Daughter   . Breast cancer Neg Hx   . Colon cancer Neg Hx    Social History   Socioeconomic History  . Marital status: Divorced    Spouse name: Not on file  . Number of children: 2  . Years of education: Not on file  . Highest education level: Not on file  Occupational History  . Occupation: retired  Tobacco Use  . Smoking status: Former Smoker    Packs/day: 0.50    Years: 20.00    Pack years: 10.00    Quit date: 07/26/1984    Years since quitting: 35.8  . Smokeless tobacco: Never Used  . Tobacco comment: quit smoking years ago  Vaping Use  . Vaping Use: Never used  Substance and Sexual Activity  . Alcohol use: No    Alcohol/week: 0.0 standard drinks  . Drug use: No  . Sexual activity: Never  Other Topics Concern  . Not on file  Social History Narrative  . Not on file   Social Determinants of Health   Financial Resource Strain: Low Risk   . Difficulty of Paying Living Expenses: Not hard at all  Food Insecurity:   . Worried About Charity fundraiser in the Last Year: Not on file  . Ran Out of Food in the Last Year: Not on file  Transportation Needs:   . Lack of Transportation (Medical): Not on file  . Lack of Transportation (Non-Medical): Not on file  Physical Activity:   . Days of Exercise per Week: Not on file  . Minutes of Exercise per Session: Not on file  Stress:   . Feeling of Stress : Not on file  Social Connections:   . Frequency of Communication with Friends and Family: Not on file  . Frequency of Social Gatherings with Friends  and Family: Not on file  . Attends Religious Services: Not on file  . Active Member of Clubs or Organizations: Not on file  . Attends Archivist Meetings: Not on file  . Marital Status: Not on file    Outpatient Encounter Medications as of 06/05/2020  Medication Sig  . aspirin 81 MG tablet Take 81 mg by mouth daily.  . benazepril (LOTENSIN) 40 MG tablet Take 1 tablet by mouth once daily  . Calcium Carbonate-Vitamin D (CALCIUM 600+D) 600-400 MG-UNIT per tablet Take 1 tablet by mouth 2 (two) times daily.  . Cholecalciferol (VITAMIN D) 2000 UNITS tablet Take 1,000 Units by mouth daily.   . CONTOUR TEST test strip USE 1 STRIP TO CHECK GLUCOSE ONCE DAILY  . cyanocobalamin (,VITAMIN B-12,) 1000 MCG/ML injection INJECT 1 ML INTRAMUSCULARLY ONCE EVERY MONTH  . hydrochlorothiazide (HYDRODIURIL) 25 MG tablet Take 1/2 (one-half) tablet by mouth once daily  . Lancets MISC Check sugars once a day Dx: E11.9 (Contour Next)  . Melatonin 10 MG TABS Take by mouth.  . Multiple Vitamins-Minerals (PRESERVISION AREDS PO) Take 2 capsules by mouth daily.  . niacin 500 MG tablet Take 500 mg by mouth at bedtime.   Marland Kitchen nystatin cream (MYCOSTATIN) Apply 1 application topically 2 (two) times daily.  Marland Kitchen omeprazole (PRILOSEC) 20 MG capsule Take 20 mg by mouth daily.  . [DISCONTINUED] metFORMIN (GLUCOPHAGE XR) 500 MG 24 hr tablet Take 1 tablet (500 mg total) by mouth 2 (two) times daily.  . metFORMIN (GLUCOPHAGE) 500 MG tablet Two tablets bid   No facility-administered encounter medications on file as of 06/05/2020.    Review of Systems  Constitutional: Negative for appetite change and unexpected weight change.  HENT: Negative for congestion, sinus pressure and sore throat.   Eyes: Negative for pain and visual disturbance.  Respiratory: Negative for cough, chest tightness and shortness of breath.   Cardiovascular: Negative for chest pain, palpitations and leg swelling.  Gastrointestinal: Negative for  abdominal pain, nausea and vomiting.       Bowels stable.   Genitourinary: Negative for difficulty urinating and dysuria.       Nocturia  Musculoskeletal: Negative for joint swelling and myalgias.  Skin: Negative for color change and rash.  Neurological: Negative for dizziness, light-headedness and headaches.  Hematological: Negative for adenopathy. Does not bruise/bleed easily.  Psychiatric/Behavioral: Negative for agitation and dysphoric mood.       Objective:    Physical Exam Vitals reviewed.  Constitutional:      General: She is not in acute distress.    Appearance: Normal appearance. She is well-developed.  HENT:  Head: Normocephalic and atraumatic.     Right Ear: External ear normal.     Left Ear: External ear normal.  Eyes:     General: No scleral icterus.       Right eye: No discharge.        Left eye: No discharge.     Conjunctiva/sclera: Conjunctivae normal.  Neck:     Thyroid: No thyromegaly.  Cardiovascular:     Rate and Rhythm: Normal rate and regular rhythm.  Pulmonary:     Effort: No tachypnea, accessory muscle usage or respiratory distress.     Breath sounds: Normal breath sounds. No decreased breath sounds or wheezing.  Chest:     Breasts:        Right: No inverted nipple, mass, nipple discharge or tenderness (no axillary adenopathy).        Left: No inverted nipple, mass, nipple discharge or tenderness (no axilarry adenopathy).  Abdominal:     General: Bowel sounds are normal.     Palpations: Abdomen is soft.     Tenderness: There is no abdominal tenderness.  Musculoskeletal:        General: No swelling or tenderness.     Cervical back: Neck supple. No tenderness.  Lymphadenopathy:     Cervical: No cervical adenopathy.  Skin:    Findings: No erythema or rash.  Neurological:     Mental Status: She is alert and oriented to person, place, and time.  Psychiatric:        Mood and Affect: Mood normal.        Behavior: Behavior normal.     BP  128/76   Pulse 90   Temp 98.1 F (36.7 C) (Oral)   Resp 16   Ht _0  (1.626 m)   Wt 204 lb 12.8 oz (92.9 kg)   SpO2 98%   BMI 35.15 kg/m  Wt Readings from Last 3 Encounters:  06/05/20 204 lb 12.8 oz (92.9 kg)  01/31/20 207 lb (93.9 kg)  01/15/20 205 lb (93 kg)     Lab Results  Component Value Date   WBC 8.6 05/30/2020   HGB 13.2 05/30/2020   HCT 39.4 05/30/2020   PLT 334.0 05/30/2020   GLUCOSE 115 (H) 05/30/2020   CHOL 258 (H) 05/30/2020   TRIG 215.0 (H) 05/30/2020   HDL 54.00 05/30/2020   LDLDIRECT 171.0 05/30/2020   LDLCALC 136 (H) 01/25/2020   ALT 15 05/30/2020   AST 14 05/30/2020   NA 139 05/30/2020   K 4.8 05/30/2020   CL 97 05/30/2020   CREATININE 0.86 05/30/2020   BUN 19 05/30/2020   CO2 34 (H) 05/30/2020   TSH 5.27 (H) 05/30/2020   INR 0.9 04/03/2012   HGBA1C 7.1 (H) 05/30/2020   MICROALBUR 0.8 05/30/2020    MM 3D SCREEN BREAST BILATERAL  Result Date: 05/20/2020 CLINICAL DATA:  Screening. EXAM: DIGITAL SCREENING BILATERAL MAMMOGRAM WITH TOMO AND CAD COMPARISON:  Previous exam(s). ACR Breast Density Category b: There are scattered areas of fibroglandular density. FINDINGS: There are no findings suspicious for malignancy. Images were processed with CAD. IMPRESSION: No mammographic evidence of malignancy. A result letter of this screening mammogram will be mailed directly to the patient. RECOMMENDATION: Screening mammogram in one year. (Code:SM-B-01Y) BI-RADS CATEGORY  1: Negative. Electronically Signed   By: Lajean Manes M.D.   On: 05/20/2020 14:02       Assessment & Plan:   Problem List Items Addressed This Visit    Right shoulder pain - Primary  Persistent pain.  Check xray.       Relevant Orders   DG Shoulder Right (Completed)   Right knee pain    Persistent pain.  Tylenol.  Check xray.       Relevant Orders   DG Knee 1-2 Views Right (Completed)   Pain in scapula    Persistent pain - right shoulder and scapula.  Check xray.  Consider PT  and/or ortho pending results.        Relevant Orders   DG Shoulder Right (Completed)   Nocturia    Previously evaluated by urology and told (per pt) - had a "small bladder".  Check urine to confirm no infection.        Relevant Orders   Urinalysis, Routine w reflex microscopic (Completed)   Urine Culture (Completed)   Hypertension    Blood pressures as outlined.  Continue benazepril and hctz.  Follow pressures.  Follow metabolic panel.       Hypercholesterolemia    Has tried multiple statins and zetia.  Intolerance.  Discussed repatha.  Declines.  Low cholesterol diet and exercise.  Follow lipid panel.       Health care maintenance    Physical today 06/05/20.  Mammogram 05/20/20 - Birads I.  Colonoscopy 2017.        Diabetes mellitus ([place])    a1c 7.1,  Discussed low carb diet and exercise.  She wants to change metformin extended release to regular metformin due to nocturia.  Discussed other treatment options.  Wants to follow sugars.  Check blood sugar bid.  Send in readings.  Continue metformin.  Follow met b and a1c.        Relevant Medications   metFORMIN (GLUCOPHAGE) 500 MG tablet   Aortic atherosclerosis ([facility])    Intolerant to statin medication and zetia.  Discussed repatha.  She declines.  Low cholesterol diet and exercise.  Follow lipid panel.           [name], MD

## 2020-06-06 LAB — URINE CULTURE
MICRO NUMBER:: 11185899
SPECIMEN QUALITY:: ADEQUATE

## 2020-06-08 ENCOUNTER — Other Ambulatory Visit: Payer: Self-pay | Admitting: Internal Medicine

## 2020-06-08 ENCOUNTER — Encounter: Payer: Self-pay | Admitting: Internal Medicine

## 2020-06-08 DIAGNOSIS — M25511 Pain in right shoulder: Secondary | ICD-10-CM

## 2020-06-08 DIAGNOSIS — M25561 Pain in right knee: Secondary | ICD-10-CM

## 2020-06-08 NOTE — Assessment & Plan Note (Signed)
Has tried multiple statins and zetia.  Intolerance.  Discussed repatha.  Declines.  Low cholesterol diet and exercise.  Follow lipid panel.

## 2020-06-08 NOTE — Assessment & Plan Note (Signed)
Previously evaluated by urology and told (per pt) - had a "small bladder".  Check urine to confirm no infection.

## 2020-06-08 NOTE — Assessment & Plan Note (Signed)
Persistent pain.  Check xray.

## 2020-06-08 NOTE — Assessment & Plan Note (Addendum)
Physical today 06/05/20.  Mammogram 05/20/20 - Birads I.  Colonoscopy 2017.

## 2020-06-08 NOTE — Assessment & Plan Note (Signed)
Persistent pain.  Tylenol.  Check xray.

## 2020-06-08 NOTE — Assessment & Plan Note (Signed)
Intolerant to statin medication and zetia.  Discussed repatha.  She declines.  Low cholesterol diet and exercise.  Follow lipid panel.

## 2020-06-08 NOTE — Assessment & Plan Note (Signed)
Persistent pain - right shoulder and scapula.  Check xray.  Consider PT and/or ortho pending results.

## 2020-06-08 NOTE — Assessment & Plan Note (Signed)
a1c 7.1,  Discussed low carb diet and exercise.  She wants to change metformin extended release to regular metformin due to nocturia.  Discussed other treatment options.  Wants to follow sugars.  Check blood sugar bid.  Send in readings.  Continue metformin.  Follow met b and a1c.   

## 2020-06-08 NOTE — Progress Notes (Signed)
Order placed for ortho referral.   

## 2020-06-08 NOTE — Assessment & Plan Note (Signed)
Blood pressures as outlined.  Continue benazepril and hctz.  Follow pressures.  Follow metabolic panel.

## 2020-06-12 ENCOUNTER — Other Ambulatory Visit: Payer: Self-pay

## 2020-06-12 ENCOUNTER — Encounter: Payer: Self-pay | Admitting: Internal Medicine

## 2020-06-12 ENCOUNTER — Other Ambulatory Visit: Payer: Self-pay | Admitting: Internal Medicine

## 2020-06-12 DIAGNOSIS — H353211 Exudative age-related macular degeneration, right eye, with active choroidal neovascularization: Secondary | ICD-10-CM | POA: Diagnosis not present

## 2020-06-12 MED ORDER — CONTOUR TEST VI STRP: ORAL_STRIP | 5 refills | Status: DC

## 2020-06-28 ENCOUNTER — Ambulatory Visit: Payer: Medicare Other | Attending: Internal Medicine

## 2020-06-28 ENCOUNTER — Other Ambulatory Visit: Payer: Self-pay | Admitting: Internal Medicine

## 2020-06-28 DIAGNOSIS — Z23 Encounter for immunization: Secondary | ICD-10-CM

## 2020-06-28 NOTE — Progress Notes (Signed)
   Covid-19 Vaccination Clinic  Name:  Misty Jones    MRN: [id number] DOB: 12/16/1940  06/28/2020  Misty Jones was observed post Covid-19 immunization for 15 minutes without incident. She was provided with Vaccine Information Sheet and instruction to access the V-Safe system.   Misty Jones was instructed to call 911 with any severe reactions post vaccine: Marland Kitchen Difficulty breathing  . Swelling of face and throat  . A fast heartbeat  . A bad rash all over body  . Dizziness and weakness   Immunizations Administered    No immunizations on file.

## 2020-07-17 ENCOUNTER — Ambulatory Visit (INDEPENDENT_AMBULATORY_CARE_PROVIDER_SITE_OTHER): Payer: Medicare Other | Admitting: Internal Medicine

## 2020-07-17 ENCOUNTER — Other Ambulatory Visit: Payer: Self-pay

## 2020-07-17 DIAGNOSIS — E7800 Pure hypercholesterolemia, unspecified: Secondary | ICD-10-CM

## 2020-07-17 DIAGNOSIS — R198 Other specified symptoms and signs involving the digestive system and abdomen: Secondary | ICD-10-CM | POA: Diagnosis not present

## 2020-07-17 DIAGNOSIS — K219 Gastro-esophageal reflux disease without esophagitis: Secondary | ICD-10-CM

## 2020-07-17 DIAGNOSIS — E1165 Type 2 diabetes mellitus with hyperglycemia: Secondary | ICD-10-CM | POA: Diagnosis not present

## 2020-07-17 DIAGNOSIS — M25561 Pain in right knee: Secondary | ICD-10-CM

## 2020-07-17 DIAGNOSIS — T466X5A Adverse effect of antihyperlipidemic and antiarteriosclerotic drugs, initial encounter: Secondary | ICD-10-CM | POA: Diagnosis not present

## 2020-07-17 DIAGNOSIS — I10 Essential (primary) hypertension: Secondary | ICD-10-CM | POA: Diagnosis not present

## 2020-07-17 DIAGNOSIS — R351 Nocturia: Secondary | ICD-10-CM | POA: Diagnosis not present

## 2020-07-17 DIAGNOSIS — I700 Atherosclerosis of aorta: Secondary | ICD-10-CM

## 2020-07-17 DIAGNOSIS — R7989 Other specified abnormal findings of blood chemistry: Secondary | ICD-10-CM

## 2020-07-17 DIAGNOSIS — Z86010 Personal history of colonic polyps: Secondary | ICD-10-CM | POA: Diagnosis not present

## 2020-07-17 NOTE — Progress Notes (Signed)
Patient ID: Misty Jones, female   DOB: Aug 07, 1940, 79 y.o.   MRN: [id number]   Subjective:    Patient ID: Misty Jones, female    DOB: [birth year], 79 y.o.   MRN: [id number]  HPI This visit occurred during the SARS-CoV-2 public health emergency.  Safety protocols were in place, including screening questions prior to the visit, additional usage of staff PPE, and extensive cleaning of exam room while observing appropriate contact time as indicated for disinfecting solutions.  Patient here for a scheduled follow up.  She is doing better. Nocturia is better.  Feels like the increased nocturia was related to her vitamins she was taking.  Better now.  States last eye exam 10-05/2020 ok.  Blood sugars appear to be doing better.  Last night 130.  No chest pain or sob reported.  No abdominal pain or bowel change reported.  Seeing ortho for right knee.  Diagnosed with OA.  Prescribed meloxicam.  Appears to be handling stress.     Past Medical History:  Diagnosis Date   B12 deficiency    Chicken pox    Diabetes mellitus ([place])    GERD (gastroesophageal reflux disease)    History of diverticulitis of colon    Hypercholesterolemia    Hypertension    Nasal drainage    Past Surgical History:  Procedure Laterality Date   ABDOMINAL HYSTERECTOMY  1984   BACK SURGERY  1986   ruptured disc, Dr Hardin [facility])   [place]   COLONOSCOPY WITH PROPOFOL N/A 06/29/2016   Procedure: COLONOSCOPY WITH PROPOFOL;  Surgeon: Manya Silvas, MD;  Location: [place];  Service: Endoscopy;  Laterality: N/A;   ESOPHAGOGASTRODUODENOSCOPY (EGD) WITH PROPOFOL N/A 12/13/2017   Procedure: ESOPHAGOGASTRODUODENOSCOPY (EGD) WITH PROPOFOL;  Surgeon: Manya Silvas, MD;  Location: [facility] ENDOSCOPY;  Service: Endoscopy;  Laterality: N/A;   TONSILLECTOMY     Family History  Problem Relation Age of Onset   Hypertension Mother    Cancer Mother        Mouth cancer    Heart disease Father        myocardial infarction   Raynaud syndrome Sister    Asthma Sister    Congenital heart disease Sister    Thyroid disease Sister    Thyroid cancer Daughter    Breast cancer Neg Hx    Colon cancer Neg Hx    Social History   Socioeconomic History   Marital status: Divorced    Spouse name: Not on file   Number of children: 2   Years of education: Not on file   Highest education level: Not on file  Occupational History   Occupation: retired  Tobacco Use   Smoking status: Former Smoker    Packs/day: 0.50    Years: 20.00    Pack years: 10.00    Quit date: 07/26/1984    Years since quitting: 36.0   Smokeless tobacco: Never Used   Tobacco comment: quit smoking years ago  Vaping Use   Vaping Use: Never used  Substance and Sexual Activity   Alcohol use: No    Alcohol/week: 0.0 standard drinks   Drug use: No   Sexual activity: Never  Other Topics Concern   Not on file  Social History Narrative   Not on file   Social Determinants of Health   Financial Resource Strain: Low Risk    Difficulty of Paying Living Expenses: Not hard at all  Food Insecurity: Not  on file  Transportation Needs: Not on file  Physical Activity: Not on file  Stress: Not on file  Social Connections: Not on file    Outpatient Encounter Medications as of 07/17/2020  Medication Sig   aspirin 81 MG tablet Take 81 mg by mouth daily.   benazepril (LOTENSIN) 40 MG tablet Take 1 tablet by mouth once daily   Calcium Carbonate-Vitamin D (CALCIUM 600+D) 600-400 MG-UNIT per tablet Take 1 tablet by mouth 2 (two) times daily.   Cholecalciferol (VITAMIN D) 2000 UNITS tablet Take 1,000 Units by mouth daily.    cyanocobalamin (,VITAMIN B-12,) 1000 MCG/ML injection INJECT 1 ML INTRAMUSCULARLY ONCE EVERY MONTH   glucose blood (CONTOUR TEST) test strip USE 1 STRIP TO CHECK GLUCOSE ONCE DAILY. Dx E11.9   hydrochlorothiazide (HYDRODIURIL) 25 MG tablet Take 1/2  (one-half) tablet by mouth once daily   Lancets MISC Check sugars once a day Dx: E11.9 (Contour Next)   Melatonin 10 MG TABS Take by mouth.   metFORMIN (GLUCOPHAGE) 500 MG tablet Two tablets bid   niacin 500 MG tablet Take 500 mg by mouth at bedtime.    nystatin cream (MYCOSTATIN) Apply 1 application topically 2 (two) times daily.   omeprazole (PRILOSEC) 20 MG capsule Take 20 mg by mouth daily.   [DISCONTINUED] Multiple Vitamins-Minerals (PRESERVISION AREDS PO) Take 2 capsules by mouth daily.   No facility-administered encounter medications on file as of 07/17/2020.    Review of Systems  Constitutional: Negative for appetite change and unexpected weight change.  HENT: Negative for congestion.   Respiratory: Negative for cough, chest tightness and shortness of breath.   Cardiovascular: Negative for chest pain, palpitations and leg swelling.  Gastrointestinal: Negative for abdominal pain, diarrhea, nausea and vomiting.  Genitourinary: Negative for difficulty urinating and dysuria.  Musculoskeletal: Negative for myalgias.       Knee pain as outlined.  Seeing ortho.   Skin: Negative for color change and rash.  Neurological: Negative for dizziness, light-headedness and headaches.  Psychiatric/Behavioral: Negative for agitation and dysphoric mood.       Objective:    Physical Exam HENT:     Nose: Nose normal.     Mouth/Throat:     Mouth: Oropharynx is clear and moist.  Neck:     Thyroid: No thyromegaly.  Cardiovascular:     Rate and Rhythm: Normal rate and regular rhythm.  Pulmonary:     Effort: No respiratory distress.     Breath sounds: Normal breath sounds. No wheezing.  Abdominal:     General: Bowel sounds are normal.     Palpations: Abdomen is soft.     Tenderness: There is no abdominal tenderness.  Musculoskeletal:        General: No tenderness or edema.     Cervical back: Neck supple.  Lymphadenopathy:     Cervical: No cervical adenopathy.     BP 118/72     Pulse 86    Temp 98.1 F (36.7 C) (Oral)    Resp 16    Ht 5' 4"  (1.626 m)    Wt 206 lb 3.2 oz (93.5 kg)    SpO2 98%    BMI 35.39 kg/m  Wt Readings from Last 3 Encounters:  07/17/20 206 lb 3.2 oz (93.5 kg)  06/05/20 204 lb 12.8 oz (92.9 kg)  01/31/20 207 lb (93.9 kg)     Lab Results  Component Value Date   WBC 8.6 05/30/2020   HGB 13.2 05/30/2020   HCT 39.4 05/30/2020  PLT 334.0 05/30/2020   GLUCOSE 115 (H) 05/30/2020   CHOL 258 (H) 05/30/2020   TRIG 215.0 (H) 05/30/2020   HDL 54.00 05/30/2020   LDLDIRECT 171.0 05/30/2020   LDLCALC 136 (H) 01/25/2020   ALT 15 05/30/2020   AST 14 05/30/2020   NA 139 05/30/2020   K 4.8 05/30/2020   CL 97 05/30/2020   CREATININE 0.86 05/30/2020   BUN 19 05/30/2020   CO2 34 (H) 05/30/2020   TSH 5.27 (H) 05/30/2020   INR 0.9 04/03/2012   HGBA1C 7.1 (H) 05/30/2020   MICROALBUR 0.8 05/30/2020    MM 3D SCREEN BREAST BILATERAL  Result Date: 05/20/2020 CLINICAL DATA:  Screening. EXAM: DIGITAL SCREENING BILATERAL MAMMOGRAM WITH TOMO AND CAD COMPARISON:  Previous exam(s). ACR Breast Density Category b: There are scattered areas of fibroglandular density. FINDINGS: There are no findings suspicious for malignancy. Images were processed with CAD. IMPRESSION: No mammographic evidence of malignancy. A result letter of this screening mammogram will be mailed directly to the patient. RECOMMENDATION: Screening mammogram in one year. (Code:SM-B-01Y) BI-RADS CATEGORY  1: Negative. Electronically Signed   By: Lajean Manes M.D.   On: 05/20/2020 14:02       Assessment & Plan:   Problem List Items Addressed This Visit    Diabetes mellitus ([place])    Low carb diet and exercise.  On metformin.  Nocturia improved with stopping her vitamins.  Follow met b and a1c.       Relevant Orders   Hemoglobin V4J   Basic metabolic panel   Hypercholesterolemia    Has tried multiple statins and zetia.  Intolerance.  Low cholesterol diet and exercise.  Follow lipid panel.         Relevant Orders   Hepatic function panel   Lipid panel   TSH   Hypertension    Blood pressure as outlined.  Continue benazepril and hctz.  Follow pressures.  Follow metabolic panel.       GERD (gastroesophageal reflux disease)    Upper symptoms controlled. On omeprazole.        History of colonic polyps    Colonoscopy 06/2016.  One polyp in ascending colon and two rectal polyps (tubular adenomas).  Recommended f/u in 5 years.        Aortic atherosclerosis ([place])    Intolerant to statin medication and zetia.  Have discussed repatha.  Follow.       Nocturia    Improved with changing vitamins.  Follow.        Change in bowel movement    Bowels stable.  Follow.       Adverse effect of statin    Had intolerance to statin medication and zetia.        Right knee pain    Seeing ortho - [facility].  Started on meloxicam.  Continue f/u with ortho.       Elevated TSH    Slightly elevated TSH on recent check.  Recheck tsh with next labs.            [name], MD

## 2020-07-23 ENCOUNTER — Encounter: Payer: Self-pay | Admitting: Internal Medicine

## 2020-07-23 ENCOUNTER — Telehealth: Payer: Self-pay

## 2020-07-23 ENCOUNTER — Other Ambulatory Visit: Payer: Self-pay | Admitting: Internal Medicine

## 2020-07-23 DIAGNOSIS — Z20822 Contact with and (suspected) exposure to covid-19: Secondary | ICD-10-CM

## 2020-07-23 NOTE — Telephone Encounter (Signed)
Mychart message sent to patient is read as below: Order has been placed by Dr French Ana.   Please call and schedule a Covid swab for Monday. Our number is [phone]. Reason it will be Monday is because our office is closed on Friday

## 2020-07-24 ENCOUNTER — Encounter: Payer: Self-pay | Admitting: Internal Medicine

## 2020-07-24 DIAGNOSIS — R7989 Other specified abnormal findings of blood chemistry: Secondary | ICD-10-CM | POA: Insufficient documentation

## 2020-07-24 NOTE — Assessment & Plan Note (Signed)
Low carb diet and exercise.  On metformin.  Nocturia improved with stopping her vitamins.  Follow met b and a1c.

## 2020-07-24 NOTE — Assessment & Plan Note (Signed)
Has tried multiple statins and zetia.  Intolerance.  Low cholesterol diet and exercise.  Follow lipid panel.

## 2020-07-24 NOTE — Assessment & Plan Note (Addendum)
Colonoscopy 06/2016.  One polyp in ascending colon and two rectal polyps (tubular adenomas).  Recommended f/u in 5 years.

## 2020-07-24 NOTE — Assessment & Plan Note (Signed)
Seeing ortho - [facility].  Started on meloxicam.  Continue f/u with ortho.

## 2020-07-24 NOTE — Assessment & Plan Note (Signed)
Intolerant to statin medication and zetia.  Have discussed repatha.  Follow.

## 2020-07-24 NOTE — Assessment & Plan Note (Signed)
Slightly elevated TSH on recent check.  Recheck tsh with next labs.

## 2020-07-24 NOTE — Assessment & Plan Note (Signed)
Had intolerance to statin medication and zetia.

## 2020-07-24 NOTE — Assessment & Plan Note (Signed)
Improved with changing vitamins.  Follow.

## 2020-07-24 NOTE — Assessment & Plan Note (Signed)
Bowels stable.  Follow.   

## 2020-07-24 NOTE — Assessment & Plan Note (Signed)
Upper symptoms controlled.  On omeprazole.   

## 2020-07-24 NOTE — Assessment & Plan Note (Signed)
Blood pressure as outlined.  Continue benazepril and hctz.  Follow pressures.  Follow metabolic panel.

## 2020-07-29 ENCOUNTER — Other Ambulatory Visit: Payer: Medicare Other

## 2020-07-29 DIAGNOSIS — Z20822 Contact with and (suspected) exposure to covid-19: Secondary | ICD-10-CM

## 2020-07-31 LAB — NOVEL CORONAVIRUS, NAA: SARS-CoV-2, NAA: NOT DETECTED

## 2020-07-31 LAB — SARS-COV-2, NAA 2 DAY TAT

## 2020-08-07 ENCOUNTER — Encounter: Payer: Self-pay | Admitting: Internal Medicine

## 2020-08-08 ENCOUNTER — Other Ambulatory Visit: Payer: Self-pay

## 2020-08-08 NOTE — Telephone Encounter (Signed)
I called patient after trying to call [facility] twice with getting no response. All number does is ring when I chose the option to speak to an associate. Pt stated that she didn't know what ws needed & neither do I since scriot is for 1x a day as medicare requires with DX code. I advised patient to try to call [facility] to see if someone else could give more info & I would try them as well. I also let patient know that [facility] reli[facility]on brand was an option if she could not get contour through medicare right now. Strips & meter are around $10 each.

## 2020-08-13 ENCOUNTER — Encounter: Payer: Self-pay | Admitting: Internal Medicine

## 2020-08-14 ENCOUNTER — Encounter: Payer: Self-pay | Admitting: Internal Medicine

## 2020-08-22 ENCOUNTER — Encounter: Payer: Self-pay | Admitting: Internal Medicine

## 2020-08-22 NOTE — Telephone Encounter (Signed)
Spoke with pharmacy. They are faxing over form that medicare needs completed. Spoke with patient to let her know. Will fax back once received and completed.

## 2020-08-27 ENCOUNTER — Encounter: Payer: Self-pay | Admitting: Internal Medicine

## 2020-08-28 ENCOUNTER — Telehealth (INDEPENDENT_AMBULATORY_CARE_PROVIDER_SITE_OTHER): Payer: Medicare Other | Admitting: Internal Medicine

## 2020-08-28 ENCOUNTER — Encounter: Payer: Self-pay | Admitting: Internal Medicine

## 2020-08-28 ENCOUNTER — Other Ambulatory Visit: Payer: Medicare Other

## 2020-08-28 DIAGNOSIS — I10 Essential (primary) hypertension: Secondary | ICD-10-CM

## 2020-08-28 DIAGNOSIS — R059 Cough, unspecified: Secondary | ICD-10-CM | POA: Diagnosis not present

## 2020-08-28 DIAGNOSIS — E1165 Type 2 diabetes mellitus with hyperglycemia: Secondary | ICD-10-CM | POA: Diagnosis not present

## 2020-08-28 DIAGNOSIS — K219 Gastro-esophageal reflux disease without esophagitis: Secondary | ICD-10-CM | POA: Diagnosis not present

## 2020-08-28 MED ORDER — ALBUTEROL SULFATE HFA 108 (90 BASE) MCG/ACT IN AERS: 2.0000 | INHALATION_SPRAY | Freq: Four times a day (QID) | RESPIRATORY_TRACT | 0 refills | Status: DC | PRN

## 2020-08-28 NOTE — Telephone Encounter (Signed)
Pt scheduled with Dr Scott 

## 2020-08-28 NOTE — Progress Notes (Signed)
Patient ID: Misty Jones, female   DOB: April 05, 1941, 80 y.o.   MRN: [id number]   Virtual Visit via video Note  This visit type was conducted due to national recommendations for restrictions regarding the COVID-19 pandemic (e.g. social distancing).  This format is felt to be most appropriate for this patient at this time.  All issues noted in this document were discussed and addressed.  No physical exam was performed (except for noted visual exam findings with Video Visits).   I connected with Misty Jones by a video enabled telemedicine application and verified that I am speaking with the correct person using two identifiers. Location patient: home Location provider: work Persons participating in the virtual visit: patient, provider  The limitations, risks, security and privacy concerns of performing an evaluation and management service by video and the availability of in person appointments have been discussed.  It has also been discussed with the patient that there may be a patient responsible charge related to this service. The patient expressed understanding and agreed to proceed.   Reason for visit: work in appt  HPI: Work in appt - cough and congestion.  Symptoms started over this past week.  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  Developed increased cough.  Cough worsened 5 days ago.  No loss of taste or smell.  Increased nasal congestion.  Increased drainage.  Cough - productive.  Raw throat.  No chest tightness.  Chest is sore to touch.  Some wheezing.  No sob.  No nausea.  Eating.  Multiple bowel movements per day.  No increased diarrhea.  No earache.     ROS: See pertinent positives and negatives per HPI.  Past Medical History:  Diagnosis Date  . B12 deficiency   . Chicken pox   . Diabetes mellitus ([place])   . GERD (gastroesophageal reflux disease)   . History of diverticulitis of colon   . Hypercholesterolemia   . Hypertension   . Nasal drainage     Past Surgical History:   Procedure Laterality Date  . ABDOMINAL HYSTERECTOMY  1984  . BACK SURGERY  1986   ruptured disc, Dr Hardin [facility])  . CHOLECYSTECTOMY  1991  . CHOLECYSTECTOMY  1992  . COLONOSCOPY WITH PROPOFOL N/A 06/29/2016   Procedure: COLONOSCOPY WITH PROPOFOL;  Surgeon: Manya Silvas, MD;  Location: [facility] ENDOSCOPY;  Service: Endoscopy;  Laterality: N/A;  . ESOPHAGOGASTRODUODENOSCOPY (EGD) WITH PROPOFOL N/A 12/13/2017   Procedure: ESOPHAGOGASTRODUODENOSCOPY (EGD) WITH PROPOFOL;  Surgeon: Manya Silvas, MD;  [facility] ENDOSCOPY;  Service: Endoscopy;  Laterality: N/A;  . TONSILLECTOMY      Family History  Problem Relation Age of Onset  . Hypertension Mother   . Cancer Mother        Mouth cancer  . Heart disease Father        myocardial infarction  . Raynaud syndrome Sister   . Asthma Sister   . Congenital heart disease Sister   . Thyroid disease Sister   . Thyroid cancer Daughter   . Breast cancer Neg Hx   . Colon cancer Neg Hx     SOCIAL HX: reviewed.    Current Outpatient Medications:  .  albuterol (VENTOLIN HFA) 108 (90 Base) MCG/ACT inhaler, Inhale 2 puffs into the lungs every 6 (six) hours as needed for wheezing or shortness of breath., Disp: 18 g, Rfl: 0 .  aspirin 81 MG tablet, Take 81 mg by mouth daily., Disp: , Rfl:  .  benazepril (LOTENSIN) 40 MG tablet,  Take 1 tablet by mouth once daily, Disp: 90 tablet, Rfl: 0 .  Calcium Carbonate-Vitamin D (CALCIUM 600+D) 600-400 MG-UNIT per tablet, Take 1 tablet by mouth 2 (two) times daily., Disp: , Rfl:  .  Cholecalciferol (VITAMIN D) 2000 UNITS tablet, Take 1,000 Units by mouth daily. , Disp: , Rfl:  .  cyanocobalamin (,VITAMIN B-12,) 1000 MCG/ML injection, INJECT 1 ML INTRAMUSCULARLY ONCE EVERY MONTH, Disp: 10 mL, Rfl: 0 .  glucose blood (CONTOUR TEST) test strip, USE 1 STRIP TO CHECK GLUCOSE ONCE DAILY. Dx E11.9, Disp: 50 each, Rfl: 5 .  hydrochlorothiazide (HYDRODIURIL) 25 MG tablet, Take 1/2 (one-half) tablet by mouth  once daily, Disp: 90 tablet, Rfl: 0 .  Lancets MISC, Check sugars once a day Dx: E11.9 (Contour Next), Disp: 100 each, Rfl: 3 .  Melatonin 10 MG TABS, Take by mouth., Disp: , Rfl:  .  metFORMIN (GLUCOPHAGE) 500 MG tablet, Two tablets bid, Disp: 360 tablet, Rfl: 1 .  niacin 500 MG tablet, Take 500 mg by mouth at bedtime. , Disp: , Rfl:  .  nystatin cream (MYCOSTATIN), Apply 1 application topically 2 (two) times daily., Disp: 30 g, Rfl: 0 .  omeprazole (PRILOSEC) 20 MG capsule, Take 20 mg by mouth daily., Disp: , Rfl:   EXAM:  GENERAL: alert, oriented, appears well and in no acute distress  HEENT: atraumatic, conjunttiva clear, no obvious abnormalities on inspection of external nose and ears  NECK: normal movements of the head and neck  LUNGS: on inspection no signs of respiratory distress, breathing rate appears normal, no obvious gross SOB, gasping or wheezing  CV: no obvious cyanosis  PSYCH/NEURO: pleasant and cooperative, no obvious depression or anxiety, speech and thought processing grossly intact  ASSESSMENT AND PLAN:  Discussed the following assessment and plan:  Problem List Items Addressed This Visit    Cough - Primary    Cough and congestion as outlined.  Symptoms as outlined.  Discussed saline nasal spray, steroid nasal spray and mucinex/robitussin DM as directed.  Eating.  Albuterol inhaler as directed. Hold steroids.  covid swab.  Discussed quarantine guideline.  Follow.  Call with update.       Relevant Orders   COVID-19, Flu A+B and RSV (Completed)   Diabetes mellitus ([place])    On metformin.  Follow sugars.       GERD (gastroesophageal reflux disease)    Has been controlled.  Omeprazole.       Hypertension    On benazepril and hctz.  Follow pressures.  Follow metabolic panel.           I discussed the assessment and treatment plan with the patient. The patient was provided an opportunity to ask questions and all were answered. The patient agreed with the  plan and demonstrated an understanding of the instructions.   The patient was advised to call back or seek an in-person evaluation if the symptoms worsen or if the condition fails to improve as anticipated.   [name], MD

## 2020-08-29 LAB — COVID-19, FLU A+B AND RSV
Influenza A, NAA: NOT DETECTED
Influenza B, NAA: NOT DETECTED
RSV, NAA: NOT DETECTED
SARS-CoV-2, NAA: NOT DETECTED

## 2020-08-30 ENCOUNTER — Telehealth: Payer: Self-pay | Admitting: Pharmacist

## 2020-08-30 NOTE — Telephone Encounter (Signed)
[facility] required documentation of diabetes dx, treatment plan for medicare coverage of test strips. Faxed back Dr. Bary Leriche 07/17/20 visit note + form from [place].

## 2020-09-01 ENCOUNTER — Encounter: Payer: Self-pay | Admitting: Internal Medicine

## 2020-09-01 NOTE — Assessment & Plan Note (Signed)
On benazepril and hctz.  Follow pressures.  Follow metabolic panel.

## 2020-09-01 NOTE — Assessment & Plan Note (Signed)
Cough and congestion as outlined.  Symptoms as outlined.  Discussed saline nasal spray, steroid nasal spray and mucinex/robitussin DM as directed.  Eating.  Albuterol inhaler as directed. Hold steroids.  covid swab.  Discussed quarantine guideline.  Follow.  Call with update.

## 2020-09-01 NOTE — Assessment & Plan Note (Signed)
Has been controlled.  Omeprazole.

## 2020-09-01 NOTE — Assessment & Plan Note (Signed)
On metformin.  Follow sugars.

## 2020-09-13 DIAGNOSIS — H353211 Exudative age-related macular degeneration, right eye, with active choroidal neovascularization: Secondary | ICD-10-CM | POA: Diagnosis not present

## 2020-09-13 DIAGNOSIS — H353124 Nonexudative age-related macular degeneration, left eye, advanced atrophic with subfoveal involvement: Secondary | ICD-10-CM | POA: Diagnosis not present

## 2020-09-15 ENCOUNTER — Other Ambulatory Visit: Payer: Self-pay | Admitting: Internal Medicine

## 2020-09-17 ENCOUNTER — Other Ambulatory Visit: Payer: Self-pay | Admitting: Internal Medicine

## 2020-09-17 DIAGNOSIS — E1165 Type 2 diabetes mellitus with hyperglycemia: Secondary | ICD-10-CM

## 2020-09-20 ENCOUNTER — Encounter: Payer: Self-pay | Admitting: Internal Medicine

## 2020-09-20 ENCOUNTER — Other Ambulatory Visit: Payer: Self-pay

## 2020-09-20 MED ORDER — CYANOCOBALAMIN 1000 MCG/ML IJ SOLN: INTRAMUSCULAR | 0 refills | Status: DC

## 2020-09-24 ENCOUNTER — Encounter: Payer: Self-pay | Admitting: Internal Medicine

## 2020-09-25 ENCOUNTER — Other Ambulatory Visit: Payer: Self-pay

## 2020-09-25 ENCOUNTER — Encounter: Payer: Self-pay | Admitting: Internal Medicine

## 2020-09-25 DIAGNOSIS — E1165 Type 2 diabetes mellitus with hyperglycemia: Secondary | ICD-10-CM

## 2020-09-25 MED ORDER — METFORMIN HCL ER 500 MG PO TB24: 500.0000 mg | ORAL_TABLET | Freq: Two times a day (BID) | ORAL | 1 refills | Status: DC

## 2020-09-25 NOTE — Telephone Encounter (Signed)
LMTCB. See other my chart message.

## 2020-10-15 ENCOUNTER — Other Ambulatory Visit: Payer: Self-pay

## 2020-10-15 ENCOUNTER — Other Ambulatory Visit (INDEPENDENT_AMBULATORY_CARE_PROVIDER_SITE_OTHER): Payer: Medicare Other

## 2020-10-15 DIAGNOSIS — E1165 Type 2 diabetes mellitus with hyperglycemia: Secondary | ICD-10-CM | POA: Diagnosis not present

## 2020-10-15 DIAGNOSIS — E7800 Pure hypercholesterolemia, unspecified: Secondary | ICD-10-CM | POA: Diagnosis not present

## 2020-10-15 LAB — BASIC METABOLIC PANEL
BUN: 18 mg/dL (ref 6–23)
CO2: 31 mEq/L (ref 19–32)
Calcium: 9.1 mg/dL (ref 8.4–10.5)
Chloride: 101 mEq/L (ref 96–112)
Creatinine, Ser: 0.95 mg/dL (ref 0.40–1.20)
GFR: 56.61 mL/min — ABNORMAL LOW (ref >60.00)
Glucose, Bld: 142 mg/dL — ABNORMAL HIGH (ref 70–99)
Potassium: 4.8 mEq/L (ref 3.5–5.1)
Sodium: 140 mEq/L (ref 135–145)

## 2020-10-15 LAB — LIPID PANEL
Cholesterol: 240 mg/dL — ABNORMAL HIGH (ref 0–200)
HDL: 49.2 mg/dL (ref >39.00)
NonHDL: 191.24
Total CHOL/HDL Ratio: 5
Triglycerides: 201 mg/dL — ABNORMAL HIGH (ref 0.0–149.0)
VLDL: 40.2 mg/dL — ABNORMAL HIGH (ref 0.0–40.0)

## 2020-10-15 LAB — LDL CHOLESTEROL, DIRECT: Direct LDL: 142 mg/dL

## 2020-10-15 LAB — HEMOGLOBIN A1C: Hgb A1c MFr Bld: 7 % — ABNORMAL HIGH (ref 4.6–6.5)

## 2020-10-15 LAB — HEPATIC FUNCTION PANEL
ALT: 14 U/L (ref 0–35)
AST: 13 U/L (ref 0–37)
Albumin: 4 g/dL (ref 3.5–5.2)
Alkaline Phosphatase: 64 U/L (ref 39–117)
Bilirubin, Direct: 0.1 mg/dL (ref 0.0–0.3)
Total Bilirubin: 0.4 mg/dL (ref 0.2–1.2)
Total Protein: 6.3 g/dL (ref 6.0–8.3)

## 2020-10-15 LAB — TSH: TSH: 4.06 u[IU]/mL (ref 0.35–4.50)

## 2020-10-17 ENCOUNTER — Other Ambulatory Visit: Payer: Self-pay

## 2020-10-17 ENCOUNTER — Ambulatory Visit (INDEPENDENT_AMBULATORY_CARE_PROVIDER_SITE_OTHER): Payer: Medicare Other | Admitting: Internal Medicine

## 2020-10-17 ENCOUNTER — Encounter: Payer: Self-pay | Admitting: Internal Medicine

## 2020-10-17 ENCOUNTER — Telehealth: Payer: Self-pay

## 2020-10-17 DIAGNOSIS — I700 Atherosclerosis of aorta: Secondary | ICD-10-CM

## 2020-10-17 DIAGNOSIS — E7800 Pure hypercholesterolemia, unspecified: Secondary | ICD-10-CM | POA: Diagnosis not present

## 2020-10-17 DIAGNOSIS — E1165 Type 2 diabetes mellitus with hyperglycemia: Secondary | ICD-10-CM

## 2020-10-17 DIAGNOSIS — R7989 Other specified abnormal findings of blood chemistry: Secondary | ICD-10-CM

## 2020-10-17 DIAGNOSIS — I10 Essential (primary) hypertension: Secondary | ICD-10-CM

## 2020-10-17 DIAGNOSIS — K219 Gastro-esophageal reflux disease without esophagitis: Secondary | ICD-10-CM | POA: Diagnosis not present

## 2020-10-17 DIAGNOSIS — R059 Cough, unspecified: Secondary | ICD-10-CM

## 2020-10-17 DIAGNOSIS — Z86010 Personal history of colonic polyps: Secondary | ICD-10-CM | POA: Diagnosis not present

## 2020-10-17 MED ORDER — CONTOUR TEST VI STRP: ORAL_STRIP | 5 refills | Status: DC

## 2020-10-17 NOTE — Assessment & Plan Note (Signed)
Intolerant to statins and zetia.  Being referred to discuss other treatment options, for example repatha, etc.

## 2020-10-17 NOTE — Assessment & Plan Note (Signed)
Colonoscopy 12.2017 - one polyp in ascending colon and two rectal polyps (tubular adenomas).  recommended f/u in 5 years.

## 2020-10-17 NOTE — Progress Notes (Signed)
Patient ID: QUINTELLA MURA, female   DOB: Feb 20, 1941, 80 y.o.   MRN: [id number]   Subjective:    Patient ID: [name], female    DOB: Jul 16, 1941, 80 y.o.   MRN: [id number]  HPI This visit occurred during the SARS-CoV-2 public health emergency.  Safety protocols were in place, including screening questions prior to the visit, additional usage of staff PPE, and extensive cleaning of exam room while observing appropriate contact time as indicated for disinfecting solutions.  Patient here for a scheduled follow up.  Here to follow up regarding her cholesterol, her blood sugars and her blood pressure.  She feels she is doing relatively well.  No chest pain or sob reported.  No abdominal pain or bowel change.  States bowels are doing better since being on probiotic.  Not checking sugars. Has not had test strips.  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  She does need to check her blood sugars at least once per day.  [place] for f/u macular degeneration.  prilosec working.  If she watches her diet and takes her medication, her acid reflux is controlled.  Discussed labs.  Had two falls.  Last - last week.  Tripped coming into the door.  No LOC.  No head injury.  No significant residual problems from the fall.    Past Medical History:  Diagnosis Date  . B12 deficiency   . Chicken pox   . Diabetes mellitus ([place])   . GERD (gastroesophageal reflux disease)   . History of diverticulitis of colon   . Hypercholesterolemia   . Hypertension   . Nasal drainage    Past Surgical History:  Procedure Laterality Date  . ABDOMINAL HYSTERECTOMY  1984  . BACK SURGERY  1986   ruptured disc, Dr Hardin Negus [facility])  . CHOLECYSTECTOMY  1991  . CHOLECYSTECTOMY  1992  . COLONOSCOPY WITH PROPOFOL N/A 06/29/2016   Procedure: COLONOSCOPY WITH PROPOFOL;  Surgeon: Manya Silvas, MD;  [facility] END[facility];  Service: Endoscopy;  Laterality: N/A;  . ESOPHAGOGASTRODUODENOSCOPY (EGD) WITH PROPOFOL N/A 12/13/2017    Procedure: ESOPHAGOGASTRODUODENOSCOPY (EGD) WITH PROPOFOL;  Surgeon: Manya Silvas, MD;  Location[facility] ENDOSCOPY;  Service: Endoscopy;  Laterality: N/A;  . TONSILLECTOMY     Family History  Problem Relation Age of Onset  . Hypertension Mother   . Cancer Mother        Mouth cancer  . Heart disease Father        myocardial infarction  . Raynaud syndrome Sister   . Asthma Sister   . Congenital heart disease Sister   . Thyroid disease Sister   . Thyroid cancer Daughter   . Breast cancer Neg Hx   . Colon cancer Neg Hx    Social History   Socioeconomic History  . Marital status: Divorced    Spouse name: Not on file  . Number of children: 2  . Years of education: Not on file  . Highest education level: Not on file  Occupational History  . Occupation: retired  Tobacco Use  . Smoking status: Former Smoker    Packs/day: 0.50    Years: 20.00    Pack years: 10.00    Quit date: 07/26/1984    Years since quitting: 36.2  . Smokeless tobacco: Never Used  . Tobacco comment: quit smoking years ago  Vaping Use  . Vaping Use: Never used  Substance and Sexual Activity  . Alcohol use: No    Alcohol/week: 0.0 standard drinks  .  Drug use: No  . Sexual activity: Never  Other Topics Concern  . Not on file  Social History Narrative  . Not on file   Social Determinants of Health   Financial Resource Strain: Low Risk   . Difficulty of Paying Living Expenses: Not hard at all  Food Insecurity: Not on file  Transportation Needs: Not on file  Physical Activity: Not on file  Stress: Not on file  Social Connections: Not on file    Outpatient Encounter Medications as of 10/17/2020  Medication Sig  . albuterol (VENTOLIN HFA) 108 (90 Base) MCG/ACT inhaler TAKE 2 PUFFS BY MOUTH EVERY 6 HOURS AS NEEDED FOR WHEEZE OR SHORTNESS OF BREATH  . aspirin 81 MG tablet Take 81 mg by mouth daily.  . benazepril (LOTENSIN) 40 MG tablet Take 1 tablet by mouth once daily  . Calcium Carbonate-Vitamin  D (CALCIUM 600+D) 600-400 MG-UNIT per tablet Take 1 tablet by mouth 2 (two) times daily.  . Cholecalciferol (VITAMIN D) 2000 UNITS tablet Take 1,000 Units by mouth daily.   . cyanocobalamin (,VITAMIN B-12,) 1000 MCG/ML injection INJECT 1 ML INTRAMUSCULARLY ONCE EVERY MONTH  . glucose blood (CONTOUR TEST) test strip USE 1 STRIP TO CHECK GLUCOSE ONCE DAILY. Dx E11.9  . hydrochlorothiazide (HYDRODIURIL) 25 MG tablet Take 1/2 (one-half) tablet by mouth once daily  . Lancets MISC Check sugars once a day Dx: E11.9 (Contour Next)  . Melatonin 10 MG TABS Take by mouth.  . metFORMIN (GLUCOPHAGE XR) 500 MG 24 hr tablet Take 1 tablet (500 mg total) by mouth 2 (two) times daily.  . niacin 500 MG tablet Take 500 mg by mouth at bedtime.   Marland Kitchen nystatin cream (MYCOSTATIN) Apply 1 application topically 2 (two) times daily.  Marland Kitchen omeprazole (PRILOSEC) 20 MG capsule Take 20 mg by mouth daily.  . [DISCONTINUED] glucose blood (CONTOUR TEST) test strip USE 1 STRIP TO CHECK GLUCOSE ONCE DAILY. Dx E11.9   No facility-administered encounter medications on file as of 10/17/2020.    Review of Systems  Constitutional: Negative for appetite change and unexpected weight change.  HENT: Negative for congestion and sinus pressure.   Respiratory: Negative for chest tightness and shortness of breath.        Cough has essentially resolved.  No chest congestion.   Cardiovascular: Negative for chest pain, palpitations and leg swelling.  Gastrointestinal: Negative for abdominal pain, diarrhea, nausea and vomiting.  Genitourinary: Negative for difficulty urinating and dysuria.  Musculoskeletal: Negative for joint swelling and myalgias.  Skin: Negative for color change and rash.  Neurological: Negative for dizziness, light-headedness and headaches.  Psychiatric/Behavioral: Negative for agitation and dysphoric mood.       Objective:    Physical Exam Vitals reviewed.  Constitutional:      General: She is not in acute distress.     Appearance: Normal appearance.  HENT:     Head: Normocephalic and atraumatic.     Right Ear: External ear normal.     Left Ear: External ear normal.  Eyes:     General: No scleral icterus.       Right eye: No discharge.        Left eye: No discharge.     Conjunctiva/sclera: Conjunctivae normal.  Neck:     Thyroid: No thyromegaly.  Cardiovascular:     Rate and Rhythm: Normal rate and regular rhythm.  Pulmonary:     Effort: No respiratory distress.     Breath sounds: Normal breath sounds. No wheezing.  Abdominal:  General: Bowel sounds are normal.     Palpations: Abdomen is soft.     Tenderness: There is no abdominal tenderness.  Musculoskeletal:        General: No swelling or tenderness.     Cervical back: Neck supple. No tenderness.  Lymphadenopathy:     Cervical: No cervical adenopathy.  Skin:    Findings: No erythema or rash.  Neurological:     Mental Status: She is alert.  Psychiatric:        Mood and Affect: Mood normal.        Behavior: Behavior normal.     BP 120/70   Pulse 90   Temp 97.9 F (36.6 C) (Oral)   Resp 16   Ht 5\' 4"  (1.626 m)   Wt 203 lb 6.4 oz (92.3 kg)   SpO2 98%   BMI 34.91 kg/m  Wt Readings from Last 3 Encounters:  10/17/20 203 lb 6.4 oz (92.3 kg)  07/17/20 206 lb 3.2 oz (93.5 kg)  06/05/20 204 lb 12.8 oz (92.9 kg)     Lab Results  Component Value Date   WBC 8.6 05/30/2020   HGB 13.2 05/30/2020   HCT 39.4 05/30/2020   PLT 334.0 05/30/2020   GLUCOSE 142 (H) 10/15/2020   CHOL 240 (H) 10/15/2020   TRIG 201.0 (H) 10/15/2020   HDL 49.20 10/15/2020   LDLDIRECT 142.0 10/15/2020   LDLCALC 136 (H) 01/25/2020   ALT 14 10/15/2020   AST 13 10/15/2020   NA 140 10/15/2020   K 4.8 10/15/2020   CL 101 10/15/2020   CREATININE 0.95 10/15/2020   BUN 18 10/15/2020   CO2 31 10/15/2020   TSH 4.06 10/15/2020   INR 0.9 04/03/2012   HGBA1C 7.0 (H) 10/15/2020   MICROALBUR 0.8 05/30/2020    MM 3D SCREEN BREAST BILATERAL  Result Date:  05/20/2020 CLINICAL DATA:  Screening. EXAM: DIGITAL SCREENING BILATERAL MAMMOGRAM WITH TOMO AND CAD COMPARISON:  Previous exam(s). ACR Breast Density Category b: There are scattered areas of fibroglandular density. FINDINGS: There are no findings suspicious for malignancy. Images were processed with CAD. IMPRESSION: No mammographic evidence of malignancy. A result letter of this screening mammogram will be mailed directly to the patient. RECOMMENDATION: Screening mammogram in one year. (Code:SM-B-01Y) BI-RADS CATEGORY  1: Negative. Electronically Signed   By: Lajean Manes M.D.   On: 05/20/2020 14:02       Assessment & Plan:   Problem List Items Addressed This Visit    Aortic atherosclerosis ([place])    Intolerant to statins and zetia.  Being referred to discuss other treatment options, for example repatha, etc.        Cough    Not a significant issue now.  Follow.  Acid reflux controlled.  Discussed possible ACE Inh cough.  She wants to follow.  Will notify me if persistent.       Diabetes mellitus ([place])    On metformin.  a1c 7.0.  She has not been checking sugars.  Insurance issue.  Nurse discussed with pharmacy.  rx sent in for test strips.  Needs to check her sugars at least q day.  Discussed possible CGM and other treatment.  CCM referral.  Continue low carb diet and exercise.       Relevant Orders   AMB Referral to [facility] Care Coordinaton   Hemoglobin O0H   Basic metabolic panel   Elevated TSH    Just rechecked tsh and wnl.  Follow.       GERD (  gastroesophageal reflux disease)    Controlled if takes prilosec and watches what she eats.  Follow.  Continue prilosec.       History of colonic polyps    Colonoscopy 12.2017 - one polyp in ascending colon and two rectal polyps (tubular adenomas).  recommended f/u in 5 years.        Hypercholesterolemia    Has tried multiple statin medications and zetia - intolerance.  Low cholesterol diet and exercise.  Discussed repatha.  She is  interested in meeting with pharmacy to discuss other treatment options.        Relevant Orders   AMB Referral to [facility] Coordinaton   Hepatic function panel   Lipid panel   Hypertension    Blood pressure is doing well.  Continue benazepril and hctz.  Follow pressures.  Follow metabolic panel.           [name], MD

## 2020-10-17 NOTE — Assessment & Plan Note (Signed)
Has tried multiple statin medications and zetia - intolerance.  Low cholesterol diet and exercise.  Discussed repatha.  She is interested in meeting with pharmacy to discuss other treatment options.

## 2020-10-17 NOTE — Assessment & Plan Note (Signed)
Not a significant issue now.  Follow.  Acid reflux controlled.  Discussed possible ACE Inh cough.  She wants to follow.  Will notify me if persistent.

## 2020-10-17 NOTE — Assessment & Plan Note (Signed)
Controlled if takes prilosec and watches what she eats.  Follow.  Continue prilosec.

## 2020-10-17 NOTE — Assessment & Plan Note (Signed)
Just rechecked tsh and wnl.  Follow.

## 2020-10-17 NOTE — Assessment & Plan Note (Signed)
Blood pressure is doing well.  Continue benazepril and hctz.  Follow pressures.  Follow metabolic panel.

## 2020-10-17 NOTE — Chronic Care Management (AMB) (Signed)
  Care Management   Note  10/17/2020 Name: Misty Jones MRN: [id number] DOB: 1940-12-07  Misty Jones is a 80 y.o. year old female who is a primary care patient of [name], MD and is actively engaged with the care management team. I reached out to [name] by phone today to assist with re-scheduling a follow up visit with the Pharmacist  Follow up plan: Telephone appointment with care management team member scheduled for:11/19/2020  Noreene Larsson, [place], [place], [place] [address] Direct Dial: [phone] [email or web address]: [email or web address]

## 2020-10-17 NOTE — Assessment & Plan Note (Addendum)
On metformin.  a1c 7.0.  She has not been checking sugars.  Insurance issue.  Nurse discussed with pharmacy.  rx sent in for test strips.  Needs to check her sugars at least q day.  Discussed possible CGM and other treatment.  CCM referral.  Continue low carb diet and exercise.

## 2020-11-19 ENCOUNTER — Ambulatory Visit (INDEPENDENT_AMBULATORY_CARE_PROVIDER_SITE_OTHER): Payer: Medicare Other | Admitting: Pharmacist

## 2020-11-19 DIAGNOSIS — E7800 Pure hypercholesterolemia, unspecified: Secondary | ICD-10-CM

## 2020-11-19 DIAGNOSIS — E1165 Type 2 diabetes mellitus with hyperglycemia: Secondary | ICD-10-CM

## 2020-11-19 DIAGNOSIS — I10 Essential (primary) hypertension: Secondary | ICD-10-CM

## 2020-11-19 DIAGNOSIS — G720 Drug-induced myopathy: Secondary | ICD-10-CM

## 2020-11-19 DIAGNOSIS — I700 Atherosclerosis of aorta: Secondary | ICD-10-CM

## 2020-11-19 DIAGNOSIS — T466X5A Adverse effect of antihyperlipidemic and antiarteriosclerotic drugs, initial encounter: Secondary | ICD-10-CM

## 2020-11-19 NOTE — Chronic Care Management (AMB) (Signed)
  Chronic Care Management Pharmacy Note  11/19/2020 Name:  Misty Jones MRN:  [id number] DOB:  03/05/1941  Subjective: Misty Jones is an 79 y.o. year old female who is a primary patient of Scott, Charlene, MD.  The CCM team was consulted for assistance with disease management and care coordination needs.    Engaged with patient by telephone for initial visit in response to provider referral for pharmacy case management and/or care coordination services.   Consent to Services:  The patient was given the following information about Chronic Care Management services today, agreed to services, and gave verbal consent: 1. CCM service includes personalized support from designated clinical staff supervised by the primary care provider, including individualized plan of care and coordination with other care providers 2. 24/7 contact phone numbers for assistance for urgent and routine care needs. 3. Service will only be billed when office clinical staff spend 20 minutes or more in a month to coordinate care. 4. Only one practitioner may furnish and bill the service in a calendar month. 5.The patient may stop CCM services at any time (effective at the end of the month) by phone call to the office staff. 6. The patient will be responsible for cost sharing (co-pay) of up to 20% of the service fee (after annual deductible is met). Patient agreed to services and consent obtained.  Patient Care Team: Scott, Charlene, MD as PCP - General (Internal Medicine) Scott, Charlene, MD (Internal Medicine)  Recent office visits:  3/24 - PCP visit, recommended checking glucose daily, referral back to CCM for HLD discussion  Recent consult visits: None recently  Hospital visits: None in previous 6 months  Objective:  Lab Results  Component Value Date   CREATININE 0.95 10/15/2020   CREATININE 0.86 05/30/2020   CREATININE 0.89 01/25/2020    Lab Results  Component Value Date   HGBA1C 7.0 (H) 10/15/2020    Last diabetic Eye exam:  Lab Results  Component Value Date/Time   HMDIABEYEEXA No Retinopathy 05/31/2019 12:00 AM    Last diabetic Foot exam:  Lab Results  Component Value Date/Time   HMDIABFOOTEX on my exam 01/31/2020 12:00 AM        Component Value Date/Time   CHOL 240 (H) 10/15/2020 0819   CHOL 209 (H) 04/03/2012 0133   TRIG 201.0 (H) 10/15/2020 0819   TRIG 103 04/03/2012 0133   HDL 49.20 10/15/2020 0819   HDL 52 04/03/2012 0133   CHOLHDL 5 10/15/2020 0819   VLDL 40.2 (H) 10/15/2020 0819   VLDL 21 04/03/2012 0133   LDLCALC 136 (H) 01/25/2020 0835   LDLCALC 136 (H) 04/03/2012 0133   LDLDIRECT 142.0 10/15/2020 0819    Hepatic Function Latest Ref Rng & Units 10/15/2020 05/30/2020 01/25/2020  Total Protein 6.0 - 8.3 g/dL 6.3 6.2 6.4  Albumin 3.5 - 5.2 g/dL 4.0 4.1 4.1  AST 0 - 37 U/L 13 14 18  ALT 0 - 35 U/L 14 15 18  Alk Phosphatase 39 - 117 U/L 64 62 69  Total Bilirubin 0.2 - 1.2 mg/dL 0.4 0.6 0.5  Bilirubin, Direct 0.0 - 0.3 mg/dL 0.1 0.1 0.1    Lab Results  Component Value Date/Time   TSH 4.06 10/15/2020 08:19 AM   TSH 5.27 (H) 05/30/2020 08:25 AM    CBC Latest Ref Rng & Units 05/30/2020 05/30/2019 05/04/2018  WBC 4.0 - 10.5 K/uL 8.6 8.2 7.3  Hemoglobin 12.0 - 15.0 g/dL 13.2 13.2 13.1  Hematocrit 36.0 - 46.0 % 39.4 40.8   39.7  Platelets 150.0 - 400.0 K/uL 334.0 327.0 319.0    Lab Results  Component Value Date/Time   VD25OH 53.86 02/26/2014 02:19 PM   VD25OH 47 01/24/2013 09:24 AM    Clinical ASCVD: No   Social History   Tobacco Use  Smoking Status Former Smoker  . Packs/day: 0.50  . Years: 20.00  . Pack years: 10.00  . Quit date: 07/26/1984  . Years since quitting: 36.3  Smokeless Tobacco Never Used  Tobacco Comment   quit smoking years ago   BP Readings from Last 3 Encounters:  10/17/20 120/70  07/17/20 118/72  06/05/20 128/76   Pulse Readings from Last 3 Encounters:  10/17/20 90  07/17/20 86  06/05/20 90   Wt Readings from Last 3  Encounters:  10/17/20 203 lb 6.4 oz (92.3 kg)  07/17/20 206 lb 3.2 oz (93.5 kg)  06/05/20 204 lb 12.8 oz (92.9 kg)    Assessment: Review of patient past medical history, allergies, medications, health status, including review of consultants reports, laboratory and other test data, was performed as part of comprehensive evaluation and provision of chronic care management services.   SDOH:  (Social Determinants of Health) assessments and interventions performed:  SDOH Interventions   Flowsheet Row Most Recent Value  SDOH Interventions   Financial Strain Interventions Other (Comment)  [manufacturer assistance]      CCM Care Plan  Allergies  Allergen Reactions  . Augmentin [Amoxicillin-Pot Clavulanate] Other (See Comments)    vomiting  . Protonix [Pantoprazole Sodium]     Medications Reviewed Today    Reviewed by De Hollingshead, RPH-CPP (Pharmacist) on 11/19/20 at 1312  Med List Status: <None>  Medication Order Taking? Sig Documenting Provider Last Dose Status Informant  albuterol (VENTOLIN HFA) 108 (90 Base) MCG/ACT inhaler [id number] No TAKE 2 PUFFS BY MOUTH EVERY 6 HOURS AS NEEDED FOR WHEEZE OR SHORTNESS OF BREATH  Patient not taking: Reported on 11/19/2020   [name], MD Not Taking Active   aspirin 81 MG tablet [id number] Yes Take 81 mg by mouth daily. [provider] Taking Active Self           Med Note Darnelle Maffucci, Paulo Keimig E   Thu Nov 09, 2019 11:02 AM)    benazepril (LOTENSIN) 40 MG tablet [id number] Yes Take 1 tablet by mouth once daily [name], MD Taking Active   Calcium Carbonate-Vitamin D 600-400 MG-UNIT tablet [id number] Yes Take 1 tablet by mouth 2 (two) times daily. [provider] Taking Active Self  Cholecalciferol (VITAMIN D) 50 MCG (2000 UT) CAPS [id number] Yes Take 1 capsule (2,000 Units total) by mouth daily. [provider] Taking Active Self  cyanocobalamin (,VITAMIN B-12,) 1000 MCG/ML injection [id number] Yes INJECT 1 ML  INTRAMUSCULARLY ONCE EVERY MONTH [name], MD Taking Active   glucose blood (CONTOUR TEST) test strip [id number] Yes USE 1 STRIP TO CHECK GLUCOSE ONCE DAILY. Dx E11.9 [name], MD Taking Active   hydrochlorothiazide (HYDRODIURIL) 25 MG tablet [id number] Yes Take 1/2 (one-half) tablet by mouth once daily [name], MD Taking Active   Lancets MISC [id number] Yes Check sugars once a day Dx: E11.9 (Contour Next) [name], MD Taking Active Self  melatonin 3 MG TABS tablet [id number] Yes Take 3 mg by mouth every evening. [provider] Taking Active   metFORMIN (GLUCOPHAGE XR) 500 MG 24 hr tablet [id number] Yes Take 1 tablet (500 mg total) by mouth 2 (two) times daily. [name], MD Taking Active   niacin 500 MG tablet  [id number] Yes Take 500 mg by mouth at bedtime.  [provider] Taking Active Self  omeprazole (PRILOSEC) 20 MG capsule [id number] Yes Take 20 mg by mouth daily. [provider] Taking Active           Patient Active Problem List   Diagnosis Date Noted  . Elevated TSH 07/24/2020  . Pain in scapula 06/05/2020  . Right knee pain 06/05/2020  . Adverse effect of statin 03/11/2020  . Headache 01/31/2020  . B12 deficiency 01/31/2020  . Dysuria 08/20/2019  . Change in bowel movement 08/20/2019  . Nocturia 01/29/2019  . Low back pain 12/30/2017  . Herpes genitalis 08/29/2017  . Dizziness 04/25/2017  . Rash 01/28/2017  . Aortic atherosclerosis ([facility]) 12/19/2016  . Chest pain 09/27/2016  . History of colonic polyps 08/09/2016  . Abdominal pain 12/04/2015  . Neck nodule 03/01/2015  . Health care maintenance 11/03/2014  . BMI 34.0-34.9,adult 07/01/2014  . Right shoulder pain 06/28/2014  . Cough 02/26/2014  . Osteopenia 01/25/2013  . Hypertension 06/19/2012  . GERD (gastroesophageal reflux disease) 06/19/2012  . Diabetes mellitus ([facility]) 06/07/2012  . Hypercholesterolemia 06/07/2012    Immunization History  Administered  Date(s) Administered  . Fluad Quad(high Dose 65+) 05/31/2019, 05/30/2020  . Influenza Split 05/31/2012, 04/16/2014  . Influenza, High Dose Seasonal PF 03/25/2016, 04/23/2017, 05/09/2018  . Influenza-Unspecified 04/26/2012, 05/31/2012, 05/03/2013, 04/16/2014, 05/08/2015, 03/25/2016  . Moderna SARS-COV2 Booster Vaccination 06/28/2020  . Moderna Sars-Covid-2 Vaccination 09/23/2019, 10/21/2019  . Pneumococcal Conjugate-13 05/30/2013  . Pneumococcal Polysaccharide-23 12/12/2015  . Zoster 06/28/2012    Conditions to be addressed/monitored: HTN, HLD and DMII  Care Plan : Medication Management  Updates made by Travis, Catherine E, RPH-CPP since 11/19/2020 12:00 AM    Problem: Hyperlipidemia, Diabetes     Long-Range Goal: Disease Progression Prevention   Start Date: 11/19/2020  This Visit's Progress: On track  Priority: High  Note:   Current Barriers:  . Unable to independently afford treatment regimen . Unable to achieve control of cholesterol   Pharmacist Clinical Goal(s):  . Over the next 90 days, patient will achieve improvement in cholesterol through collaboration with PharmD and provider.   Interventions: . 1:1 collaboration with Scott, Charlene, MD regarding development and update of comprehensive plan of care as evidenced by provider attestation and co-signature . Inter-disciplinary care team collaboration (see longitudinal plan of care) . Comprehensive medication review performed; medication list updated in electronic medical record  Diabetes: . Controlled; current treatment: metformin XR 500 mg daily . Will discuss home BG monitoring at next appointment.  . Recommend to continue current regimen at this time  Hypertension: . Controlled; current treatment: benazepril 40 mg daily, HCTZ 12.5 mg daily. . Recommend to continue current regimen at this time  Hyperlipidemia: . Uncontrolled; current treatment: OTC niacin 500 mg daily, omega 3 fatty acids 1000 mg  daily . Medications previously tried: simvastatin, atorvastatin, rosuvastatin, pravastatin, ezetimibe; muscle pains . Re-educated on PCSK9i, also discussed bempedoic acid. With patient's current Medicare A/B without drug coverage, she would not qualify for [employer] grant funding as she has no drug coverage to start with. Patient may qualify for patient assistance for Repatha through [employer] foundation, as she does not have drug insurance.  . Scheduled f/u face to face visit to provide sample and education, as well as complete patient assistance application for Repatha through [employer].    Patient Goals/Self-Care Activities . Over the next 90 days, patient will:  - take medications as prescribed check glucose   daily, document, and provide at future appointments collaborate with provider on medication access solutions  Follow Up Plan: Face to Face appointment with care management team member scheduled for:  3 weeks      Medication Assistance: Investigating patient assistance  Patient's preferred pharmacy is:  [facility] [address], [place] - [place] [address] Phone: [phone] Fax: [phone]  [id number] [address]: [phone]: [phone] [address] [address]Phone: [phone]: [phone]   Follow Up:  Patient agrees to Care Plan and Follow-up.  Plan: Face to Face appointment with care management team member scheduled for: ~ 3 weeks  Catie TDarnelle Maffucci PharmD, BMoran CWaldportClinical Pharmacist LOccidental Petroleumat BJohnson [employer] [phone]

## 2020-11-19 NOTE — Patient Instructions (Addendum)
Misty Jones,   It was great talking with you today!  Dr. Nicki Reaper and I recommend we initiate a medication called Repatha to treat your elevated cholesterol. This is an every 2 week medication that you would inject yourself. We can apply to get this medication for free from the drug company.   Bring proof of your social security income and retirement income to our next appointment. We'll complete the patient assistance application at that time. I have samples of the Repatha that you can get started on while we work on getting the medication long term from the drug company.   Call me with any questions or concerns!  Catie Darnelle Maffucci, PharmD [phone]  Visit Information   PATIENT GOALS:  Goals Addressed              This Visit's Progress     Patient Stated   .  Medication Management (pt-stated)        Patient Goals/Self-Care Activities . Over the next 90 days, patient will:  - take medications as prescribed check glucose daily, document, and provide at future appointments collaborate with provider on medication access solutions       Consent to CCM Services: Misty Jones was given information about Chronic Care Management services today including:  1. CCM service includes personalized support from designated clinical staff supervised by her physician, including individualized plan of care and coordination with other care providers 2. 24/7 contact phone numbers for assistance for urgent and routine care needs. 3. Service will only be billed when office clinical staff spend 20 minutes or more in a month to coordinate care. 4. Only one practitioner may furnish and bill the service in a calendar month. 5. The patient may stop CCM services at any time (effective at the end of the month) by phone call to the office staff. 6. The patient will be responsible for cost sharing (co-pay) of up to 20% of the service fee (after annual deductible is met).  Patient agreed to services and verbal  consent obtained.   The patient verbalized understanding of instructions, educational materials, and care plan provided today and agreed to receive a mailed copy of patient instructions, educational materials, and care plan.   Plan: Face to Face appointment with care management team member scheduled for: ~ 3 weeks  Catie Darnelle Maffucci, PharmD, [place], CPP Clinical Pharmacist [place] [facility]: Patient Care Plan: Medication Management    Problem Identified: Hyperlipidemia, Diabetes     Long-Range Goal: Disease Progression Prevention   Start Date: 11/19/2020  This Visit's Progress: On track  Priority: High  Note:   Current Barriers:  . Unable to independently afford treatment regimen . Unable to achieve control of cholesterol   Pharmacist Clinical Goal(s):  Marland Kitchen Over the next 90 days, patient will achieve improvement in cholesterol through collaboration with PharmD and provider.   Interventions: . 1:1 collaboration with [name], MD regarding development and update of comprehensive plan of care as evidenced by provider attestation and co-signature . Inter-disciplinary care team collaboration (see longitudinal plan of care) . Comprehensive medication review performed; medication list updated in electronic medical record  Diabetes: . Controlled; current treatment: metformin XR 500 mg daily . Will discuss home BG monitoring at next appointment.  . Recommend to continue current regimen at this time  Hypertension: . Controlled; current treatment: benazepril 40 mg daily, HCTZ 12.5 mg daily. . Recommend to continue current regimen at this time  Hyperlipidemia: . Uncontrolled; current  treatment: OTC niacin 500 mg daily, omega 3 fatty acids 1000 mg daily . Medications previously tried: simvastatin, atorvastatin, rosuvastatin, pravastatin, ezetimibe; muscle pains . Re-educated on PCSK9i, also discussed bempedoic acid. With patient's  current Medicare A/B without drug coverage, she would not qualify for [employer] as she has no drug coverage to start with. Patient may qualify for patient assistance for Repatha through [employer], as she does not have drug insurance.  . Scheduled f/u face to face visit to provide sample and education, as well as complete patient assistance application for Repatha through [place].    Patient Goals/Self-Care Activities . Over the next 90 days, patient will:  - take medications as prescribed check glucose daily, document, and provide at future appointments collaborate with provider on medication access solutions  Follow Up Plan: Face to Face appointment with care management team member scheduled for:  3 weeks

## 2020-12-11 ENCOUNTER — Other Ambulatory Visit: Payer: Self-pay

## 2020-12-11 ENCOUNTER — Ambulatory Visit (INDEPENDENT_AMBULATORY_CARE_PROVIDER_SITE_OTHER): Payer: Medicare Other | Admitting: Pharmacist

## 2020-12-11 DIAGNOSIS — M791 Myalgia, unspecified site: Secondary | ICD-10-CM

## 2020-12-11 DIAGNOSIS — E119 Type 2 diabetes mellitus without complications: Secondary | ICD-10-CM

## 2020-12-11 DIAGNOSIS — I700 Atherosclerosis of aorta: Secondary | ICD-10-CM

## 2020-12-11 DIAGNOSIS — E7800 Pure hypercholesterolemia, unspecified: Secondary | ICD-10-CM | POA: Diagnosis not present

## 2020-12-11 DIAGNOSIS — T466X5A Adverse effect of antihyperlipidemic and antiarteriosclerotic drugs, initial encounter: Secondary | ICD-10-CM

## 2020-12-11 MED ORDER — REPATHA SURECLICK 140 MG/ML ~~LOC~~ SOAJ: 140.0000 mg | SUBCUTANEOUS | 0 refills | Status: DC

## 2020-12-11 NOTE — Patient Instructions (Addendum)
Misty Jones,   Your next dose is Wednesday, June 1st.   We are going to submit the patient assistance application. Please let me know if you hear anything from [place] before we reach out to you.   Call me with any questions or concerns! I'll call you on Friday, June 3rd to follow up.   Catie Darnelle Maffucci, PharmD [phone]  Visit Information  PATIENT GOALS: Goals Addressed              This Visit's Progress     Patient Stated   .  Medication Management (pt-stated)        Patient Goals/Self-Care Activities . Over the next 90 days, patient will:  - take medications as prescribed check glucose daily, document, and provide at future appointments collaborate with provider on medication access solutions        Print copy of patient instructions, educational materials, and care plan provided in person.  Plan: Telephone follow up appointment with care management team member scheduled for:  ~ 3 weeks  Catie Darnelle Maffucci, PharmD, [place], [place] Clinical Pharmacist [employer] at [employer] [phone]

## 2020-12-11 NOTE — Chronic Care Management (AMB) (Signed)
Chronic Care Management Pharmacy Note  12/11/2020 Name:  LISL SLINGERLAND MRN:  [id number] DOB:  [birth year]  Subjective: Misty Jones is an 80 y.o. year old female who is a primary patient of [name], MD.  The CCM team was consulted for assistance with disease management and care coordination needs.    Engaged with patient face to face for follow up visit in response to provider referral for pharmacy case management and/or care coordination services.   Consent to Services:  The patient was given information about Chronic Care Management services, agreed to services, and gave verbal consent prior to initiation of services.  Please see initial visit note for detailed documentation.   Patient Care Team: [name], MD as PCP - General (Internal Medicine) [name], MD (Internal Medicine)  Recent office visits: None since our last call  Recent consult visits: None since our last call  Hospital visits: None in previous 6 months  Objective:  Lab Results  Component Value Date   CREATININE 0.95 10/15/2020   CREATININE 0.86 05/30/2020   CREATININE 0.89 01/25/2020    Lab Results  Component Value Date   HGBA1C 7.0 (H) 10/15/2020   Last diabetic Eye exam:  Lab Results  Component Value Date/Time   HMDIABEYEEXA No Retinopathy 05/31/2019 12:00 AM    Last diabetic Foot exam:  Lab Results  Component Value Date/Time   HMDIABFOOTEX on my exam 01/31/2020 12:00 AM        Component Value Date/Time   CHOL 240 (H) 10/15/2020 0819   CHOL 209 (H) 04/03/2012 0133   TRIG 201.0 (H) 10/15/2020 0819   TRIG 103 04/03/2012 0133   HDL 49.20 10/15/2020 0819   HDL 52 04/03/2012 0133   CHOLHDL 5 10/15/2020 0819   VLDL 40.2 (H) 10/15/2020 0819   VLDL 21 04/03/2012 0133   LDLCALC 136 (H) 01/25/2020 0835   LDLCALC 136 (H) 04/03/2012 0133   LDLDIRECT 142.0 10/15/2020 0819    Hepatic Function Latest Ref Rng & Units 10/15/2020 05/30/2020 01/25/2020  Total Protein 6.0 - 8.3  g/dL 6.3 6.2 6.4  Albumin 3.5 - 5.2 g/dL 4.0 4.1 4.1  AST 0 - 37 U/L 13 14 18   ALT 0 - 35 U/L 14 15 18   Alk Phosphatase 39 - 117 U/L 64 62 69  Total Bilirubin 0.2 - 1.2 mg/dL 0.4 0.6 0.5  Bilirubin, Direct 0.0 - 0.3 mg/dL 0.1 0.1 0.1    Lab Results  Component Value Date/Time   TSH 4.06 10/15/2020 08:19 AM   TSH 5.27 (H) 05/30/2020 08:25 AM    CBC Latest Ref Rng & Units 05/30/2020 05/30/2019 05/04/2018  WBC 4.0 - 10.5 K/uL 8.6 8.2 7.3  Hemoglobin 12.0 - 15.0 g/dL 13.2 13.2 13.1  Hematocrit 36.0 - 46.0 % 39.4 40.8 39.7  Platelets 150.0 - 400.0 K/uL 334.0 327.0 319.0    Lab Results  Component Value Date/Time   VD25OH 53.86 02/26/2014 02:19 PM   VD25OH 47 01/24/2013 09:24 AM    Clinical ASCVD: Yes  - aortic atherosclerosis   Social History   Tobacco Use  Smoking Status Former Smoker  . Packs/day: 0.50  . Years: 20.00  . Pack years: 10.00  . Quit date: 07/26/1984  . Years since quitting: 36.4  Smokeless Tobacco Never Used  Tobacco Comment   quit smoking years ago   BP Readings from Last 3 Encounters:  10/17/20 120/70  07/17/20 118/72  06/05/20 128/76   Pulse Readings from Last 3 Encounters:  10/17/20 90  07/17/20 86  06/05/20 90   Wt Readings from Last 3 Encounters:  10/17/20 203 lb 6.4 oz (92.3 kg)  07/17/20 206 lb 3.2 oz (93.5 kg)  06/05/20 204 lb 12.8 oz (92.9 kg)    Assessment: Review of patient past medical history, allergies, medications, health status, including review of consultants reports, laboratory and other test data, was performed as part of comprehensive evaluation and provision of chronic care management services.   SDOH:  (Social Determinants of Health) assessments and interventions performed:  SDOH Interventions   Flowsheet Row Most Recent Value  SDOH Interventions   Financial Strain Interventions Other (Comment)  [manufacturer assistance]      CCM Care Plan  Allergies  Allergen Reactions  . Augmentin [Amoxicillin-Pot Clavulanate]  Other (See Comments)    vomiting  . Protonix [Pantoprazole Sodium]     Medications Reviewed Today    Reviewed by De Hollingshead, RPH-CPP (Pharmacist) on 12/11/20 at 1326  Med List Status: <None>  Medication Order Taking? Sig Documenting Provider Last Dose Status Informant  albuterol (VENTOLIN HFA) 108 (90 Base) MCG/ACT inhaler [id number] No TAKE 2 PUFFS BY MOUTH EVERY 6 HOURS AS NEEDED FOR WHEEZE OR SHORTNESS OF BREATH  Patient not taking: No sig reported   [name], MD Not Taking Active   aspirin 81 MG tablet [id number] Yes Take 81 mg by mouth daily. [provider] Taking Active Self           Med Note Darnelle Maffucci, Jamyron Redd E   Thu Nov 09, 2019 11:02 AM)    benazepril (LOTENSIN) 40 MG tablet [id number] Yes Take 1 tablet by mouth once daily [name], MD Taking Active   Calcium Carbonate-Vitamin D 600-400 MG-UNIT tablet [id number] Yes Take 1 tablet by mouth 2 (two) times daily. [provider] Taking Active Self  Cholecalciferol (VITAMIN D) 50 MCG (2000 UT) CAPS [id number] Yes Take 1 capsule (2,000 Units total) by mouth daily. [provider] Taking Active Self  cyanocobalamin (,VITAMIN B-12,) 1000 MCG/ML injection [id number] Yes INJECT 1 ML INTRAMUSCULARLY ONCE EVERY MONTH [name], MD Taking Active   glucose blood (CONTOUR TEST) test strip [id number]  USE 1 STRIP TO CHECK GLUCOSE ONCE DAILY. Dx E11.9 [name], MD  Active   hydrochlorothiazide (HYDRODIURIL) 25 MG tablet [id number] Yes Take 1/2 (one-half) tablet by mouth once daily [name], MD Taking Active   Lancets MISC [id number]  Check sugars once a day Dx: E11.9 (Contour Next) [name], MD  Active Self  melatonin 3 MG TABS tablet [id number] Yes Take 3 mg by mouth every evening. [provider] Taking Active   metFORMIN (GLUCOPHAGE XR) 500 MG 24 hr tablet [id number] Yes Take 1 tablet (500 mg total) by mouth 2 (two) times daily. [name], MD Taking Active   niacin 500  MG tablet [id number] Yes Take 500 mg by mouth at bedtime.  [provider] Taking Active Self  Omega-3 1000 MG CAPS [id number] Yes Take 1,000 mg by mouth daily. [provider] Taking Active   omeprazole (PRILOSEC) 20 MG capsule [id number] Yes Take 20 mg by mouth daily. [provider] Taking Active           Patient Active Problem List   Diagnosis Date Noted  . Elevated TSH 07/24/2020  . Pain in scapula 06/05/2020  . Right knee pain 06/05/2020  . Adverse effect of statin 03/11/2020  . Headache 01/31/2020  . B12 deficiency 01/31/2020  . Dysuria 08/20/2019  . Change in bowel movement 08/20/2019  .  Nocturia 01/29/2019  . Low back pain 12/30/2017  . Herpes genitalis 08/29/2017  . Dizziness 04/25/2017  . Rash 01/28/2017  . Aortic atherosclerosis ([place]) 12/19/2016  . Chest pain 09/27/2016  . History of colonic polyps 08/09/2016  . Abdominal pain 12/04/2015  . Neck nodule 03/01/2015  . Health care maintenance 11/03/2014  . BMI 34.0-34.9,adult 07/01/2014  . Right shoulder pain 06/28/2014  . Cough 02/26/2014  . Osteopenia 01/25/2013  . Hypertension 06/19/2012  . GERD (gastroesophageal reflux disease) 06/19/2012  . Diabetes mellitus ([place]) 06/07/2012  . Hypercholesterolemia 06/07/2012    Immunization History  Administered Date(s) Administered  . Fluad Quad(high Dose 65+) 05/31/2019, 05/30/2020  . Influenza Split 05/31/2012, 04/16/2014  . Influenza, High Dose Seasonal PF 03/25/2016, 04/23/2017, 05/09/2018  . Influenza-Unspecified 04/26/2012, 05/31/2012, 05/03/2013, 04/16/2014, 05/08/2015, 03/25/2016  . Moderna SARS-COV2 Booster Vaccination 06/28/2020  . Moderna Sars-Covid-2 Vaccination 09/23/2019, 10/21/2019  . Pneumococcal Conjugate-13 05/30/2013  . Pneumococcal Polysaccharide-23 12/12/2015  . Zoster 06/28/2012    Conditions to be addressed/monitored: CAD, HTN, HLD and DMII  Care Plan : Medication Management  Updates made by De Hollingshead,  RPH-CPP since 12/11/2020 12:00 AM    Problem: Hyperlipidemia, Diabetes     Long-Range Goal: Disease Progression Prevention   Start Date: 11/19/2020  This Visit's Progress: On track  Recent Progress: On track  Priority: High  Note:   Current Barriers:  . Unable to independently afford treatment regimen . Unable to achieve control of cholesterol   Pharmacist Clinical Goal(s):  Marland Kitchen Over the next 90 days, patient will achieve improvement in cholesterol through collaboration with PharmD and provider.   Interventions: . 1:1 collaboration with [name], MD regarding development and update of comprehensive plan of care as evidenced by provider attestation and co-signature . Inter-disciplinary care team collaboration (see longitudinal plan of care) . Comprehensive medication review performed; medication list updated in electronic medical record  Diabetes: . Controlled; current treatment: metformin XR 500 mg twice daily . Checking home BG once daily. Notes that Medicare A/B covers test strips . Recommend to continue current regimen at this time  Hypertension: . Controlled; current treatment: benazepril 40 mg daily, HCTZ 12.5 mg daily. . Recommend to continue current regimen at this time  Hyperlipidemia: . Uncontrolled; current treatment: OTC niacin 500 mg daily, omega 3 fatty acids 1000 mg daily . Medications previously tried: simvastatin, atorvastatin, rosuvastatin, pravastatin, ezetimibe; muscle pains . Notes that she spoke to a niece who is a Software engineer, who advised her to start treatment. Extensive discussion of side effects, benefits, monitoring. Offered for patient to discuss again with Dr. Nicki Reaper or re-establish with Dr. Nehemiah Massed to discuss. Patient notes that they would both "tell me to do it". Patient amenable. Counseled on injection technique. Start Repatha 140 mg Q14 days. Completed Dance movement psychotherapist [employer] patient assistance application. Will collaborate w/ CPhT to submit and follow up.  Provided 2 samples, patient injected first dose today in office.    Patient Goals/Self-Care Activities . Over the next 90 days, patient will:  - take medications as prescribed check glucose daily, document, and provide at future appointments collaborate with provider on medication access solutions  Follow Up Plan: Telephone follow up appointment with care management team member scheduled for: 3 weeks      Medication Assistance: Application for Repatha   medication assistance program. in process.  Anticipated assistance start date TBD.  See plan of care for additional detail.  Patient's preferred pharmacy is:  [facility] [address], [place]  [place] [address] Phone: [phone] Fax: [phone]  [id number] [place] [address]: [phone]: [phone] [place] [address] [facility] [place] [address]: [phone]: [phone]  Follow Up:  Patient agrees to Care Plan and Follow-up.  Plan: Telephone follow up appointment with care management team member scheduled for:  ~ 3 weeks  Catie TDarnelle Maffucci PharmD, [place]ow CEnolaClinical Pharmacist LOccidental [facility]role[facility] [employer] [phone]

## 2020-12-24 ENCOUNTER — Other Ambulatory Visit: Payer: Self-pay | Admitting: Internal Medicine

## 2020-12-24 ENCOUNTER — Telehealth: Payer: Self-pay | Admitting: Pharmacy Technician

## 2020-12-24 DIAGNOSIS — Z596 Low income: Secondary | ICD-10-CM

## 2020-12-24 NOTE — Progress Notes (Signed)
[facility])                                            [place] Team    12/24/2020  Misty Jones 1940/12/13 [id number]  Received both patient and provier portion(s) of patient assistance application(s) for Repatha. Faxed completed application and required documents into [employer].  Sharee Pimple P. Jacobs [place], [place]  [phone]

## 2020-12-27 ENCOUNTER — Ambulatory Visit (INDEPENDENT_AMBULATORY_CARE_PROVIDER_SITE_OTHER): Payer: Medicare Other | Admitting: Pharmacist

## 2020-12-27 DIAGNOSIS — E119 Type 2 diabetes mellitus without complications: Secondary | ICD-10-CM

## 2020-12-27 DIAGNOSIS — E7800 Pure hypercholesterolemia, unspecified: Secondary | ICD-10-CM

## 2020-12-27 DIAGNOSIS — I700 Atherosclerosis of aorta: Secondary | ICD-10-CM

## 2020-12-27 DIAGNOSIS — T466X5A Adverse effect of antihyperlipidemic and antiarteriosclerotic drugs, initial encounter: Secondary | ICD-10-CM

## 2020-12-27 DIAGNOSIS — M791 Myalgia, unspecified site: Secondary | ICD-10-CM

## 2020-12-27 NOTE — Patient Instructions (Signed)
Visit Information  PATIENT GOALS: Goals Addressed              This Visit's Progress     Patient Stated   .  Medication Management (pt-stated)        Patient Goals/Self-Care Activities . Over the next 90 days, patient will:  - take medications as prescribed check glucose daily, document, and provide at future appointments collaborate with provider on medication access solutions       Patient verbalizes understanding of instructions provided today and agrees to view in [place].  Plan: Telephone follow up appointment with care management team member scheduled for:  ~ 6 weeks  Catie Darnelle Maffucci, PharmD, [place], [place] Clinical Pharmacist [employer] at [employer] [phone]

## 2020-12-27 NOTE — Chronic Care Management (AMB) (Signed)
Chronic Care Management Pharmacy Note  12/27/2020 Name:  Misty Jones MRN:  [id number] DOB:  11/25/1940   Subjective: Misty Jones is an 80 y.o. year old female who is a primary patient of [name], MD.  The CCM team was consulted for assistance with disease management and care coordination needs.    Engaged with patient by telephone for follow up visit in response to provider referral for pharmacy case management and/or care coordination services.   Consent to Services:  The patient was given information about Chronic Care Management services, agreed to services, and gave verbal consent prior to initiation of services.  Please see initial visit note for detailed documentation.   Patient Care Team: [name], MD as PCP - General (Internal Medicine) [name], MD (Internal Medicine)  Recent office visits: None since our last visit  Recent consult visits: None since our last visit  Hospital visits: None in previous 6 months  Objective:  Lab Results  Component Value Date   CREATININE 0.95 10/15/2020   CREATININE 0.86 05/30/2020   CREATININE 0.89 01/25/2020    Lab Results  Component Value Date   HGBA1C 7.0 (H) 10/15/2020   Last diabetic Eye exam:  Lab Results  Component Value Date/Time   HMDIABEYEEXA No Retinopathy 05/31/2019 12:00 AM    Last diabetic Foot exam:  Lab Results  Component Value Date/Time   HMDIABFOOTEX on my exam 01/31/2020 12:00 AM        Component Value Date/Time   CHOL 240 (H) 10/15/2020 0819   CHOL 209 (H) 04/03/2012 0133   TRIG 201.0 (H) 10/15/2020 0819   TRIG 103 04/03/2012 0133   HDL 49.20 10/15/2020 0819   HDL 52 04/03/2012 0133   CHOLHDL 5 10/15/2020 0819   VLDL 40.2 (H) 10/15/2020 0819   VLDL 21 04/03/2012 0133   LDLCALC 136 (H) 01/25/2020 0835   LDLCALC 136 (H) 04/03/2012 0133   LDLDIRECT 142.0 10/15/2020 0819    Hepatic Function Latest Ref Rng & Units 10/15/2020 05/30/2020 01/25/2020  Total Protein 6.0 -  8.3 g/dL 6.3 6.2 6.4  Albumin 3.5 - 5.2 g/dL 4.0 4.1 4.1  AST 0 - 37 U/L _0 ALT 0 - 35 U/L _1 Alk Phosphatase 39 - 117 U/L 64 62 69  Total Bilirubin 0.2 - 1.2 mg/dL 0.4 0.6 0.5  Bilirubin, Direct 0.0 - 0.3 mg/dL 0.1 0.1 0.1    Lab Results  Component Value Date/Time   TSH 4.06 10/15/2020 08:19 AM   TSH 5.27 (H) 05/30/2020 08:25 AM    CBC Latest Ref Rng & Units 05/30/2020 05/30/2019 05/04/2018  WBC 4.0 - 10.5 K/uL 8.6 8.2 7.3  Hemoglobin 12.0 - 15.0 g/dL 13.2 13.2 13.1  Hematocrit 36.0 - 46.0 % 39.4 40.8 39.7  Platelets 150.0 - 400.0 K/uL 334.0 327.0 319.0    Lab Results  Component Value Date/Time   VD25OH 53.86 02/26/2014 02:19 PM   VD25OH 47 01/24/2013 09:24 AM    Clinical ASCVD: Yes  - aortic atherosclerosis The ASCVD Risk score (Ray., et al., 2013) failed to calculate for the following reasons:   The 2013 ASCVD risk score is only valid for ages 29 to 71      Social History   Tobacco Use  Smoking Status Former Smoker  . Packs/day: 0.50  . Years: 20.00  . Pack years: 10.00  . Quit date: 07/26/1984  . Years since quitting: 36.4  Smokeless Tobacco Never Used  Tobacco  Comment   quit smoking years ago   BP Readings from Last 3 Encounters:  10/17/20 120/70  07/17/20 118/72  06/05/20 128/76   Pulse Readings from Last 3 Encounters:  10/17/20 90  07/17/20 86  06/05/20 90   Wt Readings from Last 3 Encounters:  10/17/20 203 lb 6.4 oz (92.3 kg)  07/17/20 206 lb 3.2 oz (93.5 kg)  06/05/20 204 lb 12.8 oz (92.9 kg)    Assessment: Review of patient past medical history, allergies, medications, health status, including review of consultants reports, laboratory and other test data, was performed as part of comprehensive evaluation and provision of chronic care management services.   SDOH:  (Social Determinants of Health) assessments and interventions performed:  SDOH Interventions   Flowsheet Row Most Recent Value  SDOH Interventions   Financial  Strain Interventions Other (Comment)  [manufacturer assistance]      CCM Care Plan  Allergies  Allergen Reactions  . Augmentin [Amoxicillin-Pot Clavulanate] Other (See Comments)    vomiting  . Protonix [Pantoprazole Sodium]     Medications Reviewed Today    Reviewed by De Hollingshead, RPH-CPP (Pharmacist) on 12/27/20 at [phone]  Med List Status: <None>  Medication Order Taking? Sig Documenting Provider Last Dose Status Informant  albuterol (VENTOLIN HFA) 108 (90 Base) MCG/ACT inhaler [id number]  TAKE 2 PUFFS BY MOUTH EVERY 6 HOURS AS NEEDED FOR WHEEZE OR SHORTNESS OF BREATH  Patient not taking: No sig reported   [name], MD  Active   aspirin 81 MG tablet [id number]  Take 81 mg by mouth daily. [provider]  Active Self           Med Note Darnelle Maffucci, CATHERINE E   Thu Nov 09, 2019 11:02 AM)    benazepril (LOTENSIN) 40 MG tablet [id number]  Take 1 tablet by mouth once daily [name], MD  Active   Calcium Carbonate-Vitamin D 600-400 MG-UNIT tablet [id number]  Take 1 tablet by mouth 2 (two) times daily. [provider]  Active Self  Cholecalciferol (VITAMIN D) 50 MCG (2000 UT) CAPS [id number]  Take 1 capsule (2,000 Units total) by mouth daily. [provider]  Active Self  cyanocobalamin (,VITAMIN B-12,) 1000 MCG/ML injection [id number]  INJECT 1 ML INTRAMUSCULARLY ONCE EVERY MONTH Scott, Randell Patient, MD  Active   Evolocumab [facility] SURECLICK) 326 MG/ML Darden Palmer [id number] Yes Inject 140 mg into the skin every 14 (fourteen) days. [name], MD Taking Active   glucose blood (CONTOUR TEST) test strip [id number]  USE 1 STRIP TO CHECK GLUCOSE ONCE DAILY. Dx E11.9 [name], MD  Active   hydrochlorothiazide (HYDRODIURIL) 25 MG tablet [id number]  Take 1/2 (one-half) tablet by mouth once daily [name], MD  Active   Lancets MISC [id number]  Check sugars once a day Dx: E11.9 (Contour Next) [name], MD  Active Self  melatonin 3 MG TABS tablet  [id number]  Take 3 mg by mouth every evening. [provider]  Active   metFORMIN (GLUCOPHAGE XR) 500 MG 24 hr tablet [id number]  Take 1 tablet (500 mg total) by mouth 2 (two) times daily. [name], MD  Active   niacin 500 MG tablet [id number]  Take 500 mg by mouth at bedtime.  [provider]  Active Self  Omega-3 1000 MG CAPS [id number]  Take 1,000 mg by mouth daily. [provider]  Active   omeprazole (PRILOSEC) 20 MG capsule [id number]  Take 20 mg by mouth daily. [provider]  Active  Patient Active Problem List   Diagnosis Date Noted  . Elevated TSH 07/24/2020  . Pain in scapula 06/05/2020  . Right knee pain 06/05/2020  . Adverse effect of statin 03/11/2020  . Headache 01/31/2020  . B12 deficiency 01/31/2020  . Dysuria 08/20/2019  . Change in bowel movement 08/20/2019  . Nocturia 01/29/2019  . Low back pain 12/30/2017  . Herpes genitalis 08/29/2017  . Dizziness 04/25/2017  . Rash 01/28/2017  . Aortic atherosclerosis ([place]) 12/19/2016  . Chest pain 09/27/2016  . History of colonic polyps 08/09/2016  . Abdominal pain 12/04/2015  . Neck nodule 03/01/2015  . Health care maintenance 11/03/2014  . BMI 34.0-34.9,adult 07/01/2014  . Right shoulder pain 06/28/2014  . Cough 02/26/2014  . Osteopenia 01/25/2013  . Hypertension 06/19/2012  . GERD (gastroesophageal reflux disease) 06/19/2012  . Diabetes mellitus ([place]) 06/07/2012  . Hypercholesterolemia 06/07/2012    Immunization History  Administered Date(s) Administered  . Fluad Quad(high Dose 65+) 05/31/2019, 05/30/2020  . Influenza Split 05/31/2012, 04/16/2014  . Influenza, High Dose Seasonal PF 03/25/2016, 04/23/2017, 05/09/2018  . Influenza-Unspecified 04/26/2012, 05/31/2012, 05/03/2013, 04/16/2014, 05/08/2015, 03/25/2016  . Moderna SARS-COV2 Booster Vaccination 06/28/2020  . Moderna Sars-Covid-2 Vaccination 09/23/2019, 10/21/2019  . Pneumococcal Conjugate-13 05/30/2013   . Pneumococcal Polysaccharide-23 12/12/2015  . Zoster, Live 06/28/2012    Conditions to be addressed/monitored: CAD, HLD and DMII  Care Plan : Medication Management  Updates made by De Hollingshead, RPH-CPP since 12/27/2020 12:00 AM    Problem: Hyperlipidemia, Diabetes     Long-Range Goal: Disease Progression Prevention   Start Date: 11/19/2020  This Visit's Progress: On track  Recent Progress: On track  Priority: High  Note:   Current Barriers:  . Unable to independently afford treatment regimen . Unable to achieve control of cholesterol   Pharmacist Clinical Goal(s):  Marland Kitchen Over the next 90 days, patient will achieve improvement in cholesterol through collaboration with PharmD and provider.   Interventions: . 1:1 collaboration with [name], MD regarding development and update of comprehensive plan of care as evidenced by provider attestation and co-signature . Inter-disciplinary care team collaboration (see longitudinal plan of care) . Comprehensive medication review performed; medication list updated in electronic medical record  SDOH: . Medicare A/B. No prescription drug insurance.   Diabetes: . Controlled per last lab work; current treatment: metformin XR 500 mg twice daily . Previously recommended to continue current regimen at this time  Hypertension: . Controlled per last clinic reading; current treatment: benazepril 40 mg daily, HCTZ 12.5 mg daily. . Previously recommended to continue current regimen at this time  Hyperlipidemia: . Uncontrolled; current treatment: OTC niacin 500 mg daily, omega 3 fatty acids 1000 mg daily, Repatha 140 mcg Q14 days . Medications previously tried: simvastatin, atorvastatin, rosuvastatin, pravastatin, ezetimibe; muscle pains . Reports today that she is doing well. She completed her second Repatha injection earlier this week. Notes she injected in SQ tissue in her thigh and tolerated well. Notes some runny nose and mild back  pain, but admits she has allergies and back pain at baseline.  . Continue Repatha 140 mg Q14 days. Will collaborate w/ CPhT to f/u on [employer] application for [place] assistance as scheduled. Will provide patient with another sample if needed in the interim while waiting on patient assistance decision.   Patient Goals/Self-Care Activities . Over the next 90 days, patient will:  - take medications as prescribed check glucose daily, document, and provide at future appointments collaborate with provider on medication access solutions  Follow  Up Plan: Telephone follow up appointment with care management team member scheduled for: 6 weeks      Medication Assistance: Application for Repatha   medication assistance program. in process.  Anticipated assistance start date TBD.  See plan of care for additional detail.  Patient's preferred pharmacy is:  [facility] [address], [place] - [place] [address] Phone: [phone] Fax: [phone]  [id number] [place] [address] [place]: [phone]: [phone] [place] [address] [facility] [place] [address]Phone: [phone]: [phone]  Follow Up:  Patient agrees to Care Plan and Follow-up.  Plan: Telephone follow up appointment with care management team member scheduled for:  ~ 6 weeks  Catie TDarnelle Maffucci PharmD, [place] CArlingtonClinical Pharmacist LOccidental Pet[employer]umat B[employer] [phone]

## 2021-01-01 ENCOUNTER — Telehealth: Payer: Self-pay | Admitting: Pharmacy Technician

## 2021-01-01 NOTE — Progress Notes (Signed)
[facility])                                            [place] Team    01/01/2021  TALON WITTING [birth year] [phone]   Care coordination call placed to [place] in regards to [place] application.  Spoke to [place] who informed patient was APPROVED 12/31/20-07/26/21. She informed provider's office would receive a letter indicating approval dates. The patient needs to contact [employer] to set up delivery of medication to her home by calling [employer] at [phone].  Unsuccessful outreach call placed to patient in regards to above information. HIPAA compliant voicemail left requesting a return call.  Izick Gasbarro P. Lang Zingg, [place]  [phone]

## 2021-01-02 ENCOUNTER — Ambulatory Visit: Payer: Medicare Other | Admitting: Pharmacist

## 2021-01-02 DIAGNOSIS — E7800 Pure hypercholesterolemia, unspecified: Secondary | ICD-10-CM

## 2021-01-02 DIAGNOSIS — I700 Atherosclerosis of aorta: Secondary | ICD-10-CM

## 2021-01-02 DIAGNOSIS — E119 Type 2 diabetes mellitus without complications: Secondary | ICD-10-CM

## 2021-01-02 DIAGNOSIS — T466X5A Adverse effect of antihyperlipidemic and antiarteriosclerotic drugs, initial encounter: Secondary | ICD-10-CM

## 2021-01-02 NOTE — Chronic Care Management (AMB) (Signed)
Chronic Care Management Pharmacy Note  01/02/2021 Name:  JAMERIA BRADWAY MRN:  [id number] DOB:  1940-11-10  Subjective: Misty Jones is an 80 y.o. year old female who is a primary patient of [name], MD.  The CCM team was consulted for assistance with disease management and care coordination needs.    Care coordination  for  medication access  in response to provider referral for pharmacy case management and/or care coordination services.   Consent to Services:  The patient was given information about Chronic Care Management services, agreed to services, and gave verbal consent prior to initiation of services.  Please see initial visit note for detailed documentation.   Patient Care Team: [name], MD as PCP - General (Internal Medicine) [name], MD (Internal Medicine)   Objective:  Lab Results  Component Value Date   CREATININE 0.95 10/15/2020   CREATININE 0.86 05/30/2020   CREATININE 0.89 01/25/2020    Lab Results  Component Value Date   HGBA1C 7.0 (H) 10/15/2020   Last diabetic Eye exam:  Lab Results  Component Value Date/Time   HMDIABEYEEXA No Retinopathy 05/31/2019 12:00 AM    Last diabetic Foot exam:  Lab Results  Component Value Date/Time   HMDIABFOOTEX on my exam 01/31/2020 12:00 AM        Component Value Date/Time   CHOL 240 (H) 10/15/2020 0819   CHOL 209 (H) 04/03/2012 0133   TRIG 201.0 (H) 10/15/2020 0819   TRIG 103 04/03/2012 0133   HDL 49.20 10/15/2020 0819   HDL 52 04/03/2012 0133   CHOLHDL 5 10/15/2020 0819   VLDL 40.2 (H) 10/15/2020 0819   VLDL 21 04/03/2012 0133   LDLCALC 136 (H) 01/25/2020 0835   LDLCALC 136 (H) 04/03/2012 0133   LDLDIRECT 142.0 10/15/2020 0819    Hepatic Function Latest Ref Rng & Units 10/15/2020 05/30/2020 01/25/2020  Total Protein 6.0 - 8.3 g/dL 6.3 6.2 6.4  Albumin 3.5 - 5.2 g/dL 4.0 4.1 4.1  AST 0 - 37 U/L _0 ALT 0 - 35 U/L _1 Alk Phosphatase 39 - 117 U/L 64 62 69  Total  Bilirubin 0.2 - 1.2 mg/dL 0.4 0.6 0.5  Bilirubin, Direct 0.0 - 0.3 mg/dL 0.1 0.1 0.1    Lab Results  Component Value Date/Time   TSH 4.06 10/15/2020 08:19 AM   TSH 5.27 (H) 05/30/2020 08:25 AM    CBC Latest Ref Rng & Units 05/30/2020 05/30/2019 05/04/2018  WBC 4.0 - 10.5 K/uL 8.6 8.2 7.3  Hemoglobin 12.0 - 15.0 g/dL 13.2 13.2 13.1  Hematocrit 36.0 - 46.0 % 39.4 40.8 39.7  Platelets 150.0 - 400.0 K/uL 334.0 327.0 319.0    Lab Results  Component Value Date/Time   VD25OH 53.86 02/26/2014 02:19 PM   VD25OH 47 01/24/2013 09:24 AM   Clinical ASCVD: Yes - aortic atherosclerosis The ASCVD Risk score ([place]., et al., 2013) failed to calculate for the following reasons:   The 2013 ASCVD risk score is only valid for ages 30 to 79     Social History   Tobacco Use  Smoking Status Former   Packs/day: 0.50   Years: 20.00   Pack years: 10.00   Types: Cigarettes   Quit date: 07/26/1984   Years since quitting: 36.4  Smokeless Tobacco Never  Tobacco Comments   quit smoking years ago   BP Readings from Last 3 Encounters:  10/17/20 120/70  07/17/20 118/72  06/05/20 128/76   Pulse Readings  from Last 3 Encounters:  10/17/20 90  07/17/20 86  06/05/20 90   Wt Readings from Last 3 Encounters:  10/17/20 203 lb 6.4 oz (92.3 kg)  07/17/20 206 lb 3.2 oz (93.5 kg)  06/05/20 204 lb 12.8 oz (92.9 kg)    Assessment: Review of patient past medical history, allergies, medications, health status, including review of consultants reports, laboratory and other test data, was performed as part of comprehensive evaluation and provision of chronic care management services.   SDOH:  (Social Determinants of Health) assessments and interventions performed:  SDOH Interventions    Flowsheet Row Most Recent Value  SDOH Interventions   Financial Strain Interventions Other (Comment)  [manufacturer assistance]       CCM Care Plan  Allergies  Allergen Reactions   Augmentin [Amoxicillin-Pot  Clavulanate] Other (See Comments)    vomiting   Protonix [Pantoprazole Sodium]     Medications Reviewed Today     Reviewed by De Hollingshead, RPH-CPP (Pharmacist) on 12/27/20 at [phone]  Med List Status: <None>   Medication Order Taking? Sig Documenting Provider Last Dose Status Informant  albuterol (VENTOLIN HFA) 108 (90 Base) MCG/ACT inhaler [id number]  TAKE 2 PUFFS BY MOUTH EVERY 6 HOURS AS NEEDED FOR WHEEZE OR SHORTNESS OF BREATH  Patient not taking: No sig reported   [name], MD  Active   aspirin 81 MG tablet [id number]  Take 81 mg by mouth daily. [provider]  Active Self           Med Note Darnelle Maffucci, Roslynn Holte E   Thu Nov 09, 2019 11:02 AM)    benazepril (LOTENSIN) 40 MG tablet [id number]  Take 1 tablet by mouth once daily [name], MD  Active   Calcium Carbonate-Vitamin D 600-400 MG-UNIT tablet [id number]  Take 1 tablet by mouth 2 (two) times daily. [provider]  Active Self  Cholecalciferol (VITAMIN D) 50 MCG (2000 UT) CAPS [id number]  Take 1 capsule (2,000 Units total) by mouth daily. [provider]  Active Self  cyanocobalamin (,VITAMIN B-12,) 1000 MCG/ML injection [id number]  INJECT 1 ML INTRAMUSCULARLY ONCE EVERY MONTH Scott, Randell Patient, MD  Active   Evolocumab [facility] SURECLICK) 567 MG/ML Darden Palmer [id number] Yes Inject 140 mg into the skin every 14 (fourteen) days. [name], MD Taking Active   glucose blood (CONTOUR TEST) test strip [id number]  USE 1 STRIP TO CHECK GLUCOSE ONCE DAILY. Dx E11.9 [name], MD  Active   hydrochlorothiazide (HYDRODIURIL) 25 MG tablet [id number]  Take 1/2 (one-half) tablet by mouth once daily [name], MD  Active   Lancets MISC [id number]  Check sugars once a day Dx: E11.9 (Contour Next) [name], MD  Active Self  melatonin 3 MG TABS tablet [id number]  Take 3 mg by mouth every evening. [provider]  Active   metFORMIN (GLUCOPHAGE XR) 500 MG 24 hr tablet [id number]  Take 1 tablet  (500 mg total) by mouth 2 (two) times daily. [name], MD  Active   niacin 500 MG tablet [id number]  Take 500 mg by mouth at bedtime.  [provider]  Active Self  Omega-3 1000 MG CAPS [id number]  Take 1,000 mg by mouth daily. [provider]  Active   omeprazole (PRILOSEC) 20 MG capsule [id number]  Take 20 mg by mouth daily. [provider]  Active             Patient Active Problem List   Diagnosis Date Noted   Elevated TSH 07/24/2020   Pain  in scapula 06/05/2020   Right knee pain 06/05/2020   Adverse effect of statin 03/11/2020   Headache 01/31/2020   B12 deficiency 01/31/2020   Dysuria 08/20/2019   Change in bowel movement 08/20/2019   Nocturia 01/29/2019   Low back pain 12/30/2017   Herpes genitalis 08/29/2017   Dizziness 04/25/2017   Rash 01/28/2017   Aortic atherosclerosis ([place]) 12/19/2016   Chest pain 09/27/2016   History of colonic polyps 08/09/2016   Abdominal pain 12/04/2015   Neck nodule 03/01/2015   Health care maintenance 11/03/2014   BMI 34.0-34.9,adult 07/01/2014   Right shoulder pain 06/28/2014   Cough 02/26/2014   Osteopenia 01/25/2013   Hypertension 06/19/2012   GERD (gastroesophageal reflux disease) 06/19/2012   Diabetes mellitus ([place]) 06/07/2012   Hypercholesterolemia 06/07/2012    Immunization History  Administered Date(s) Administered   Fluad Quad(high Dose 65+) 05/31/2019, 05/30/2020   Influenza Split 05/31/2012, 04/16/2014   Influenza, High Dose Seasonal PF 03/25/2016, 04/23/2017, 05/09/2018   Influenza-Unspecified 04/26/2012, 05/31/2012, 05/03/2013, 04/16/2014, 05/08/2015, 03/25/2016   Moderna SARS-COV2 Booster Vaccination 06/28/2020   Moderna Sars-Covid-2 Vaccination 09/23/2019, 10/21/2019   Pneumococcal Conjugate-13 05/30/2013   Pneumococcal Polysaccharide-23 12/12/2015   Zoster, Live 06/28/2012    Conditions to be addressed/monitored: CAD, HTN, HLD, and DMII  Care Plan : Medication Management   Updates made by De Hollingshead, RPH-CPP since 01/02/2021 12:00 AM     Problem: Hyperlipidemia, Diabetes      Long-Range Goal: Disease Progression Prevention   Start Date: 11/19/2020  Recent Progress: On track  Priority: High  Note:   Current Barriers:  Unable to independently afford treatment regimen Unable to achieve control of cholesterol   Pharmacist Clinical Goal(s):  Over the next 90 days, patient will achieve improvement in cholesterol through collaboration with PharmD and provider.   Interventions: 1:1 collaboration with [name], MD regarding development and update of comprehensive plan of care as evidenced by provider attestation and co-signature Inter-disciplinary care team collaboration (see longitudinal plan of care) Comprehensive medication review performed; medication list updated in electronic medical record  SDOH: Medicare A/B. No prescription drug insurance. Denies medication cost concerns (besides Repatha)  Health Maintenance: Due for DM eye exam. Will remind moving forward.  COVID vaccination x 3  Diabetes: Controlled per last lab work; current treatment: metformin XR 500 mg twice daily Previously recommended to continue current regimen at this time. A1c ordered for lab appointment later this month prior to PCP visit.   Hypertension: Controlled per last clinic reading; current treatment: benazepril 40 mg daily, HCTZ 12.5 mg daily. Previously recommended to continue current regimen at this time  Hyperlipidemia and ASCVD risk reduction: Uncontrolled, baseline LDL 142 off therapy; current treatment: OTC niacin 500 mg daily, OTC omega 3 fatty acids 1000 mg daily, Repatha 140 mcg Q14 days Medications previously tried: simvastatin, atorvastatin, rosuvastatin, pravastatin, ezetimibe; muscle pains Antiplatelet regimen: aspirin 81 mg daily Approved for Repatha assistance through 07/26/21. Called [employer], left voicemail on prescription line for Repatha  script. Will collaborate w/ CPhT to inform patient to call and order.   Supplements: Calcium + vitamin D, Vitamin D, Vitamin B12 IM monthly, melatonin QPM PRN  Patient Goals/Self-Care Activities Over the next 90 days, patient will:  - take medications as prescribed check glucose daily, document, and provide at future appointments collaborate with provider on medication access solutions  Follow Up Plan: Telephone follow up appointment with care management team member scheduled for: 4 weeks as previously scheduled       Medication Assistance:  Reaptha obtained through [place] medication assistance program.  Enrollment ends 07/26/21  Patient's preferred pharmacy is:  [facility] [address], [place] - [place] [address] Phone: [phone] Fax: [phone]  [id number] [place] [address]: [phone]: [phone] [place] [address] [address]Phone: [phone]: [phone] Follow Up:  Patient agrees to Care Plan and Follow-up.  Plan: Telephone follow up appointment with care management team member scheduled for:  ~ 4 weeks  Catie TDarnelle Maffucci PharmD, BH[place] CWrightClinical Pharmacist LOccidental Petroleumat BJohnson [employer] [phone]

## 2021-01-02 NOTE — Patient Instructions (Signed)
Visit Information  PATIENT GOALS:  Goals Addressed               This Visit's Progress     Patient Stated     Medication Management (pt-stated)        Patient Goals/Self-Care Activities Over the next 90 days, patient will:  - take medications as prescribed check glucose daily, document, and provide at future appointments collaborate with provider on medication access solutions          Patient verbalizes understanding of instructions provided today and agrees to view in [place].   Plan: Telephone follow up appointment with care management team member scheduled for:  ~ 4 weeks  Catie Darnelle Maffucci, PharmD, [place], [place] Clinical Pharmacist [employer] at [employer] [phone]

## 2021-01-15 ENCOUNTER — Other Ambulatory Visit: Payer: Self-pay

## 2021-01-15 ENCOUNTER — Other Ambulatory Visit (INDEPENDENT_AMBULATORY_CARE_PROVIDER_SITE_OTHER): Payer: Medicare Other

## 2021-01-15 ENCOUNTER — Ambulatory Visit: Payer: Medicare Other

## 2021-01-15 DIAGNOSIS — E7800 Pure hypercholesterolemia, unspecified: Secondary | ICD-10-CM

## 2021-01-15 DIAGNOSIS — E1165 Type 2 diabetes mellitus with hyperglycemia: Secondary | ICD-10-CM

## 2021-01-15 LAB — LIPID PANEL
Cholesterol: 143 mg/dL (ref 0–200)
HDL: 62.6 mg/dL (ref >39.00)
LDL Cholesterol: 48 mg/dL (ref 0–99)
NonHDL: 80.84
Total CHOL/HDL Ratio: 2
Triglycerides: 166 mg/dL — ABNORMAL HIGH (ref 0.0–149.0)
VLDL: 33.2 mg/dL (ref 0.0–40.0)

## 2021-01-15 LAB — BASIC METABOLIC PANEL
BUN: 18 mg/dL (ref 6–23)
CO2: 33 mEq/L — ABNORMAL HIGH (ref 19–32)
Calcium: 9.1 mg/dL (ref 8.4–10.5)
Chloride: 100 mEq/L (ref 96–112)
Creatinine, Ser: 0.87 mg/dL (ref 0.40–1.20)
GFR: 62.8 mL/min (ref >60.00)
Glucose, Bld: 134 mg/dL — ABNORMAL HIGH (ref 70–99)
Potassium: 4.3 mEq/L (ref 3.5–5.1)
Sodium: 140 mEq/L (ref 135–145)

## 2021-01-15 LAB — HEPATIC FUNCTION PANEL
ALT: 13 U/L (ref 0–35)
AST: 13 U/L (ref 0–37)
Albumin: 4 g/dL (ref 3.5–5.2)
Alkaline Phosphatase: 67 U/L (ref 39–117)
Bilirubin, Direct: 0.1 mg/dL (ref 0.0–0.3)
Total Bilirubin: 0.5 mg/dL (ref 0.2–1.2)
Total Protein: 6 g/dL (ref 6.0–8.3)

## 2021-01-15 LAB — HEMOGLOBIN A1C: Hgb A1c MFr Bld: 7 % — ABNORMAL HIGH (ref 4.6–6.5)

## 2021-01-17 ENCOUNTER — Other Ambulatory Visit: Payer: Self-pay

## 2021-01-17 ENCOUNTER — Ambulatory Visit (INDEPENDENT_AMBULATORY_CARE_PROVIDER_SITE_OTHER): Payer: Medicare Other | Admitting: Internal Medicine

## 2021-01-17 ENCOUNTER — Encounter: Payer: Self-pay | Admitting: Internal Medicine

## 2021-01-17 DIAGNOSIS — M545 Low back pain, unspecified: Secondary | ICD-10-CM

## 2021-01-17 DIAGNOSIS — Z86010 Personal history of colonic polyps: Secondary | ICD-10-CM

## 2021-01-17 DIAGNOSIS — E7800 Pure hypercholesterolemia, unspecified: Secondary | ICD-10-CM | POA: Diagnosis not present

## 2021-01-17 DIAGNOSIS — E1165 Type 2 diabetes mellitus with hyperglycemia: Secondary | ICD-10-CM | POA: Diagnosis not present

## 2021-01-17 DIAGNOSIS — I700 Atherosclerosis of aorta: Secondary | ICD-10-CM

## 2021-01-17 DIAGNOSIS — I10 Essential (primary) hypertension: Secondary | ICD-10-CM

## 2021-01-17 DIAGNOSIS — R200 Anesthesia of skin: Secondary | ICD-10-CM | POA: Diagnosis not present

## 2021-01-17 DIAGNOSIS — K219 Gastro-esophageal reflux disease without esophagitis: Secondary | ICD-10-CM

## 2021-01-17 DIAGNOSIS — M79605 Pain in left leg: Secondary | ICD-10-CM | POA: Diagnosis not present

## 2021-01-17 DIAGNOSIS — L989 Disorder of the skin and subcutaneous tissue, unspecified: Secondary | ICD-10-CM | POA: Diagnosis not present

## 2021-01-17 DIAGNOSIS — M79604 Pain in right leg: Secondary | ICD-10-CM | POA: Diagnosis not present

## 2021-01-17 MED ORDER — MUPIROCIN 2 % EX OINT: TOPICAL_OINTMENT | CUTANEOUS | 0 refills | Status: DC

## 2021-01-17 NOTE — Progress Notes (Addendum)
Patient ID: Misty Jones, female   DOB: 1940/12/04, 80 y.o.   MRN: [id number]   Subjective:    Patient ID: Misty Jones, female    DOB: 1940/12/12, 80 y.o.   MRN: [id number]  HPI This visit occurred during the SARS-CoV-2 public health emergency.  Safety protocols were in place, including screening questions prior to the visit, additional usage of staff PPE, and extensive cleaning of exam room while observing appropriate contact time as indicated for disinfecting solutions.   Patient here for a scheduled follow up.  Here to follow up regarding her cholesterol and blood sugar.  Tries to stay active. No chest pain reported.  Breathing stable.  No increased cough or congestion.  No increased abdominal pain reported.  No bowel issues reported.  No sick contacts.  No fever.  No nausea or vomiting. Has some intermittent back pain.  Flares intermittently.  Has had flare this week.  Better today.  Desires no further intervention.  Has noticed some bilateral lower leg aching - notices at night.  Discussed wearing compression hose.  Also reported some numbness/tingling/stinging - feet - intermittent.  Leg lesion.    Past Medical History:  Diagnosis Date   B12 deficiency    Chicken pox    Diabetes mellitus ([place])    GERD (gastroesophageal reflux disease)    History of diverticulitis of colon    Hypercholesterolemia    Hypertension    Nasal drainage    Past Surgical History:  Procedure Laterality Date   ABDOMINAL HYSTERECTOMY  1984   BACK SURGERY  1986   ruptured disc, Dr Hardin Negus [facility])   [place]   COLONOSCOPY WITH PROPOFOL N/A 06/29/2016   Procedure: COLONOSCOPY WITH PROPOFOL;  Surgeon: Manya Silvas, MD;  Location: [place];  Service: Endoscopy;  Laterality: N/A;   ESOPHAGOGASTRODUODENOSCOPY (EGD) WITH PROPOFOL N/A 12/13/2017   Procedure: ESOPHAGOGASTRODUODENOSCOPY (EGD) WITH PROPOFOL;  Surgeon: Manya Silvas, MD;  [facility] ENDOSCOPY;   Service: Endoscopy;  Laterality: N/A;   TONSILLECTOMY     Family History  Problem Relation Age of Onset   Hypertension Mother    Cancer Mother        Mouth cancer   Heart disease Father        myocardial infarction   Raynaud syndrome Sister    Asthma Sister    Congenital heart disease Sister    Thyroid disease Sister    Thyroid cancer Daughter    Breast cancer Neg Hx    Colon cancer Neg Hx    Social History   Socioeconomic History   Marital status: Divorced    Spouse name: Not on file   Number of children: 2   Years of education: Not on file   Highest education level: Not on file  Occupational History   Occupation: retired  Tobacco Use   Smoking status: Former    Packs/day: 0.50    Years: 20.00    Pack years: 10.00    Types: Cigarettes    Quit date: 07/26/1984    Years since quitting: 36.5   Smokeless tobacco: Never   Tobacco comments:    quit smoking years ago  Vaping Use   Vaping Use: Never used  Substance and Sexual Activity   Alcohol use: No    Alcohol/week: 0.0 standard drinks   Drug use: No   Sexual activity: Never  Other Topics Concern   Not on file  Social History Narrative   Not  on file   Social Determinants of Health   Financial Resource Strain: Medium Risk   Difficulty of Paying Living Expenses: Somewhat hard  Food Insecurity: Not on file  Transportation Needs: Not on file  Physical Activity: Not on file  Stress: Not on file  Social Connections: Not on file    Review of Systems  Constitutional:  Negative for appetite change and unexpected weight change.  HENT:  Negative for congestion and sinus pressure.   Respiratory:  Negative for cough, chest tightness and shortness of breath.   Cardiovascular:  Negative for chest pain, palpitations and leg swelling.  Gastrointestinal:  Negative for abdominal pain, diarrhea, nausea and vomiting.  Genitourinary:  Negative for difficulty urinating and dysuria.  Musculoskeletal:  Negative for joint  swelling and myalgias.  Skin:  Negative for color change and rash.  Neurological:  Negative for dizziness, light-headedness and headaches.  Psychiatric/Behavioral:  Negative for agitation and dysphoric mood.       Objective:    Physical Exam Vitals reviewed.  Constitutional:      General: She is not in acute distress.    Appearance: Normal appearance.  HENT:     Head: Normocephalic and atraumatic.     Right Ear: External ear normal.     Left Ear: External ear normal.  Eyes:     General: No scleral icterus.       Right eye: No discharge.        Left eye: No discharge.     Conjunctiva/sclera: Conjunctivae normal.  Neck:     Thyroid: No thyromegaly.  Cardiovascular:     Rate and Rhythm: Normal rate and regular rhythm.  Pulmonary:     Effort: No respiratory distress.     Breath sounds: Normal breath sounds. No wheezing.  Abdominal:     General: Bowel sounds are normal.     Palpations: Abdomen is soft.     Tenderness: There is no abdominal tenderness.  Musculoskeletal:        General: No swelling or tenderness.     Cervical back: Neck supple. No tenderness.  Lymphadenopathy:     Cervical: No cervical adenopathy.  Skin:    Findings: No erythema or rash.  Neurological:     Mental Status: She is alert.  Psychiatric:        Mood and Affect: Mood normal.        Behavior: Behavior normal.    BP 124/82 (BP Location: Left Arm, Patient Position: Sitting, Cuff Size: Large)   Pulse 83   Temp 98.2 F (36.8 C) (Oral)   Ht 5' 4"  (1.626 m)   Wt 202 lb 9.6 oz (91.9 kg)   SpO2 99%   BMI 34.78 kg/m  Wt Readings from Last 3 Encounters:  01/17/21 202 lb 9.6 oz (91.9 kg)  10/17/20 203 lb 6.4 oz (92.3 kg)  07/17/20 206 lb 3.2 oz (93.5 kg)    Outpatient Encounter Medications as of 01/17/2021  Medication Sig   aspirin 81 MG tablet Take 81 mg by mouth daily.   benazepril (LOTENSIN) 40 MG tablet Take 1 tablet by mouth once daily   Calcium Carbonate-Vitamin D 600-400 MG-UNIT tablet  Take 1 tablet by mouth 2 (two) times daily.   Cholecalciferol (VITAMIN D) 50 MCG (2000 UT) CAPS Take 1 capsule (2,000 Units total) by mouth daily.   cyanocobalamin (,VITAMIN B-12,) 1000 MCG/ML injection INJECT 1 ML INTRAMUSCULARLY ONCE EVERY MONTH   Evolocumab (REPATHA SURECLICK) 161 MG/ML SOAJ Inject 140 mg into the skin  every 14 (fourteen) days.   glucose blood (CONTOUR TEST) test strip USE 1 STRIP TO CHECK GLUCOSE ONCE DAILY. Dx E11.9   hydrochlorothiazide (HYDRODIURIL) 25 MG tablet Take 1/2 (one-half) tablet by mouth once daily   Lancets MISC Check sugars once a day Dx: E11.9 (Contour Next)   metFORMIN (GLUCOPHAGE XR) 500 MG 24 hr tablet Take 1 tablet (500 mg total) by mouth 2 (two) times daily.   mupirocin ointment (BACTROBAN) 2 % Apply to affected area bid   Omega-3 1000 MG CAPS Take 1,000 mg by mouth daily.   omeprazole (PRILOSEC) 20 MG capsule Take 20 mg by mouth daily.   [DISCONTINUED] niacin 500 MG tablet Take 500 mg by mouth at bedtime.    [DISCONTINUED] albuterol (VENTOLIN HFA) 108 (90 Base) MCG/ACT inhaler TAKE 2 PUFFS BY MOUTH EVERY 6 HOURS AS NEEDED FOR WHEEZE OR SHORTNESS OF BREATH (Patient not taking: Reported on 01/17/2021)   [DISCONTINUED] melatonin 3 MG TABS tablet Take 3 mg by mouth every evening. (Patient not taking: Reported on 01/17/2021)   No facility-administered encounter medications on file as of 01/17/2021.     Lab Results  Component Value Date   WBC 8.6 05/30/2020   HGB 13.2 05/30/2020   HCT 39.4 05/30/2020   PLT 334.0 05/30/2020   GLUCOSE 134 (H) 01/15/2021   CHOL 143 01/15/2021   TRIG 166.0 (H) 01/15/2021   HDL 62.60 01/15/2021   LDLDIRECT 142.0 10/15/2020   LDLCALC 48 01/15/2021   ALT 13 01/15/2021   AST 13 01/15/2021   NA 140 01/15/2021   K 4.3 01/15/2021   CL 100 01/15/2021   CREATININE 0.87 01/15/2021   BUN 18 01/15/2021   CO2 33 (H) 01/15/2021   TSH 4.06 10/15/2020   INR 0.9 04/03/2012   HGBA1C 7.0 (H) 01/15/2021   MICROALBUR 0.8 05/30/2020     MM 3D SCREEN BREAST BILATERAL  Result Date: 05/20/2020 CLINICAL DATA:  Screening. EXAM: DIGITAL SCREENING BILATERAL MAMMOGRAM WITH TOMO AND CAD COMPARISON:  Previous exam(s). ACR Breast Density Category b: There are scattered areas of fibroglandular density. FINDINGS: There are no findings suspicious for malignancy. Images were processed with CAD. IMPRESSION: No mammographic evidence of malignancy. A result letter of this screening mammogram will be mailed directly to the patient. RECOMMENDATION: Screening mammogram in one year. (Code:SM-B-01Y) BI-RADS CATEGORY  1: Negative. Electronically Signed   By: Lajean Manes M.D.   On: 05/20/2020 14:02       Assessment & Plan:   Problem List Items Addressed This Visit     Aortic atherosclerosis ([place])    Intolerant to statins and zetia.  On repatha.  Cholesterol improved.  Continue repatha.  Low cholesterol diet and exercise.  Follow lipid panel and liver function tests.   Lab Results  Component Value Date   CHOL 143 01/15/2021   HDL 62.60 01/15/2021   LDLCALC 48 01/15/2021   LDLDIRECT 142.0 10/15/2020   TRIG 166.0 (H) 01/15/2021   CHOLHDL 2 01/15/2021         Diabetes mellitus ([place])    On metformin.  a1c 7.0 - sugars elevated above ideal goal.  Continue low carb diet and exercise. Follow met b and a1c.  Hold on changing medication.         Relevant Orders   Hemoglobin A1c   Microalbumin / creatinine urine ratio   GERD (gastroesophageal reflux disease)    Controlled if takes prilosec and watches what she eats.  Follow.  Continue prilosec.  History of colonic polyps    Colonoscopy 12.2017 - one polyp in ascending colon and two rectal polyps (tubular adenomas).  recommended f/u in 5 years.         Hypercholesterolemia    Intolerant to statins and zetia.  On repatha.  Cholesterol improved.  Continue repatha.  Low cholesterol diet and exercise.  Follow lipid panel and liver function tests.        Relevant Orders    Hepatic function panel   Lipid panel   TSH   Hypertension    Blood pressure is doing well.  Continue benazepril and hctz.  Follow pressures.  Follow metabolic panel.        Relevant Orders   Basic metabolic panel   Leg pain, bilateral    Bilateral lower leg aching.  Compression hose.  Follow.         Low back pain    Flares intermittently.  Desires to hold on any further intervention at this time.  Follow.  Better today.        Numbness in feet    Discussed possible etiologies.  Discussed keeping sugars under good control.  Check B12 level with next labs.        Relevant Orders   Vitamin B12   Skin lesion    Lesion - right thigh.  Bactroban.           [name], MD

## 2021-01-18 ENCOUNTER — Encounter: Payer: Self-pay | Admitting: Internal Medicine

## 2021-01-18 DIAGNOSIS — L989 Disorder of the skin and subcutaneous tissue, unspecified: Secondary | ICD-10-CM | POA: Insufficient documentation

## 2021-01-18 DIAGNOSIS — M79605 Pain in left leg: Secondary | ICD-10-CM | POA: Insufficient documentation

## 2021-01-18 DIAGNOSIS — R200 Anesthesia of skin: Secondary | ICD-10-CM | POA: Insufficient documentation

## 2021-01-18 NOTE — Assessment & Plan Note (Signed)
Intolerant to statins and zetia.  On repatha.  Cholesterol improved.  Continue repatha.  Low cholesterol diet and exercise.  Follow lipid panel and liver function tests.   Lab Results  Component Value Date   CHOL 143 01/15/2021   HDL 62.60 01/15/2021   LDLCALC 48 01/15/2021   LDLDIRECT 142.0 10/15/2020   TRIG 166.0 (H) 01/15/2021   CHOLHDL 2 01/15/2021

## 2021-01-18 NOTE — Assessment & Plan Note (Signed)
Blood pressure is doing well.  Continue benazepril and hctz.  Follow pressures.  Follow metabolic panel.

## 2021-01-18 NOTE — Assessment & Plan Note (Addendum)
On metformin.  a1c 7.0 - sugars elevated above ideal goal.  Continue low carb diet and exercise. Follow met b and a1c.  Hold on changing medication.

## 2021-01-18 NOTE — Assessment & Plan Note (Signed)
Intolerant to statins and zetia.  On repatha.  Cholesterol improved.  Continue repatha.  Low cholesterol diet and exercise.  Follow lipid panel and liver function tests.

## 2021-01-18 NOTE — Assessment & Plan Note (Signed)
Bilateral lower leg aching.  Compression hose.  Follow.

## 2021-01-18 NOTE — Assessment & Plan Note (Signed)
Colonoscopy 12.2017 - one polyp in ascending colon and two rectal polyps (tubular adenomas).  recommended f/u in 5 years.

## 2021-01-18 NOTE — Assessment & Plan Note (Signed)
Flares intermittently.  Desires to hold on any further intervention at this time.  Follow.  Better today.

## 2021-01-18 NOTE — Assessment & Plan Note (Signed)
Controlled if takes prilosec and watches what she eats.  Follow.  Continue prilosec.

## 2021-01-18 NOTE — Assessment & Plan Note (Signed)
Lesion - right thigh.  Bactroban.

## 2021-01-18 NOTE — Assessment & Plan Note (Signed)
Discussed possible etiologies.  Discussed keeping sugars under good control.  Check B12 level with next labs.

## 2021-01-18 NOTE — Addendum Note (Signed)
Addended by: Alisa Graff on: 01/18/2021 08:16 PM   Modules accepted: Orders

## 2021-01-21 ENCOUNTER — Ambulatory Visit (INDEPENDENT_AMBULATORY_CARE_PROVIDER_SITE_OTHER): Payer: Medicare Other

## 2021-01-21 ENCOUNTER — Other Ambulatory Visit: Payer: Self-pay

## 2021-01-21 VITALS — BP 126/82 | Ht 64.0 in | Wt 202.0 lb

## 2021-01-21 DIAGNOSIS — Z0000 Encounter for general adult medical examination without abnormal findings: Secondary | ICD-10-CM | POA: Diagnosis not present

## 2021-01-21 NOTE — Progress Notes (Addendum)
Subjective:   Misty Jones is a 80 y.o. female who presents for Medicare Annual (Subsequent) preventive examination.  Review of Systems    No ROS.  Medicare Wellness    Cardiac Risk Factors include: advanced age (>28men, >12 women)     Objective:    Today's Vitals   01/21/21 1320  BP: 126/82  Weight: 202 lb (91.6 kg)  Height: 5\' 4"  (1.626 m)   Body mass index is 34.67 kg/m.  Advanced Directives 01/21/2021 01/15/2020 12/30/2018 01/31/2018 12/13/2017 12/11/2016 09/13/2016  Does Patient Have a Medical Advance Directive? Yes Yes Yes Yes Yes Yes No  Type of Paramedic of [place];Living will Living will Living will [place];Living will [place];Living will Living will -  Does patient want to make changes to medical advance directive? No - Patient declined No - Patient declined No - Patient declined No - Patient declined - No - Patient declined -  Copy of [place] in Chart? No - copy requested - - - No - copy requested - -  Would patient like information on creating a medical advance directive? - - - - - - No - Patient declined    Current Medications (verified) Outpatient Encounter Medications as of 01/21/2021  Medication Sig   aspirin 81 MG tablet Take 81 mg by mouth daily.   benazepril (LOTENSIN) 40 MG tablet Take 1 tablet by mouth once daily   Calcium Carbonate-Vitamin D 600-400 MG-UNIT tablet Take 1 tablet by mouth 2 (two) times daily.   Cholecalciferol (VITAMIN D) 50 MCG (2000 UT) CAPS Take 1 capsule (2,000 Units total) by mouth daily.   cyanocobalamin (,VITAMIN B-12,) 1000 MCG/ML injection INJECT 1 ML INTRAMUSCULARLY ONCE EVERY MONTH   Evolocumab (REPATHA SURECLICK) 161 MG/ML SOAJ Inject 140 mg into the skin every 14 (fourteen) days.   glucose blood (CONTOUR TEST) test strip USE 1 STRIP TO CHECK GLUCOSE ONCE DAILY. Dx E11.9   hydrochlorothiazide (HYDRODIURIL) 25 MG tablet Take 1/2 (one-half) tablet  by mouth once daily   Lancets MISC Check sugars once a day Dx: E11.9 (Contour Next)   metFORMIN (GLUCOPHAGE XR) 500 MG 24 hr tablet Take 1 tablet (500 mg total) by mouth 2 (two) times daily.   mupirocin ointment (BACTROBAN) 2 % Apply to affected area bid   Omega-3 1000 MG CAPS Take 1,000 mg by mouth daily.   omeprazole (PRILOSEC) 20 MG capsule Take 20 mg by mouth daily.   No facility-administered encounter medications on file as of 01/21/2021.    Allergies (verified) Augmentin [amoxicillin-pot clavulanate] and Protonix [pantoprazole sodium]   History: Past Medical History:  Diagnosis Date   B12 deficiency    Chicken pox    Diabetes mellitus ([place])    GERD (gastroesophageal reflux disease)    History of diverticulitis of colon    Hypercholesterolemia    Hypertension    Nasal drainage    Past Surgical History:  Procedure Laterality Date   ABDOMINAL HYSTERECTOMY  1984   BACK SURGERY  1986   ruptured disc, Dr Hardin Negus [facility])   [place]   COLONOSCOPY WITH PROPOFOL N/A 06/29/2016   Procedure: COLONOSCOPY WITH PROPOFOL;  Surgeon: Manya Silvas, MD;  Location: [place];  Service: Endoscopy;  Laterality: N/A;   ESOPHAGOGASTRODUODENOSCOPY (EGD) WITH PROPOFOL N/A 12/13/2017   Procedure: ESOPHAGOGASTRODUODENOSCOPY (EGD) WITH PROPOFOL;  Surgeon: Manya Silvas, MD;  Location: [facility] ENDOSCOPY;  Service: Endoscopy;  Laterality: N/A;  TONSILLECTOMY     Family History  Problem Relation Age of Onset   Hypertension Mother    Cancer Mother        Mouth cancer   Heart disease Father        myocardial infarction   Raynaud syndrome Sister    Asthma Sister    Congenital heart disease Sister    Thyroid disease Sister    Thyroid cancer Daughter    Breast cancer Neg Hx    Colon cancer Neg Hx    Social History   Socioeconomic History   Marital status: Divorced    Spouse name: Not on file   Number of children: 2   Years of education:  Not on file   Highest education level: Not on file  Occupational History   Occupation: retired  Tobacco Use   Smoking status: Former    Packs/day: 0.50    Years: 20.00    Pack years: 10.00    Types: Cigarettes    Quit date: 07/26/1984    Years since quitting: 36.5   Smokeless tobacco: Never   Tobacco comments:    quit smoking years ago  Vaping Use   Vaping Use: Never used  Substance and Sexual Activity   Alcohol use: No    Alcohol/week: 0.0 standard drinks   Drug use: No   Sexual activity: Never  Other Topics Concern   Not on file  Social History Narrative   Not on file   Social Determinants of Health   Financial Resource Strain: Medium Risk   Difficulty of Paying Living Expenses: Somewhat hard  Food Insecurity: No Food Insecurity   Worried About Charity fundraiser in the Last Year: Never true   Ran Out of Food in the Last Year: Never true  Transportation Needs: No Transportation Needs   Lack of Transportation (Medical): No   Lack of Transportation (Non-Medical): No  Physical Activity: Not on file  Stress: No Stress Concern Present   Feeling of Stress : Not at all  Social Connections: Unknown   Frequency of Communication with Friends and Family: More than three times a week   Frequency of Social Gatherings with Friends and Family: Not on file   Attends Religious Services: Not on Electrical engineer or Organizations: Not on file   Attends Archivist Meetings: Not on file   Marital Status: Not on file    Tobacco Counseling Counseling given: Not Answered Tobacco comments: quit smoking years ago   Clinical Intake:  Pre-visit preparation completed: Yes        Diabetes: Yes (Followed by PCP)  How often do you need to have someone help you when you read instructions, pamphlets, or other written materials from your doctor or pharmacy?: 1 - Never  Diabetes Management: Is the patient being seen by Chronic Care Management for management of  their diabetes?  Yes   Activities of Daily Living In your present state of health, do you have any difficulty performing the following activities: 01/21/2021  Hearing? N  Vision? Y  Comment Macular Degeneration. Difficulty seeing in R eye. Wears glasses.  Difficulty concentrating or making decisions? N  Walking or climbing stairs? N  Dressing or bathing? N  Doing errands, shopping? N  Preparing Food and eating ? N  Using the Toilet? N  In the past six months, have you accidently leaked urine? N  Do you have problems with loss of bowel control? N  Managing your Medications?  N  Managing your Finances? N  Housekeeping or managing your Housekeeping? N  Some recent data might be hidden    Patient Care Team: [name], MD as PCP - General (Internal Medicine) [name], MD (Internal Medicine)  Indicate any recent Medical Services you may have received from other than [facility] providers in the past year (date may be approximate).     Assessment:   This is a routine wellness examination for Jasamine.  Hearing/Vision screen Hearing Screening - Comments:: Patient is able to hear conversational tones without difficulty.  No issues reported. Vision Screening - Comments:: Followed by [facility]  Wears corrective lenses  Detached retina; specialist  Cataract extraction, bilateral  They have seen their ophthalmologist in the last 12 months.   Dietary issues and exercise activities discussed: Current Exercise Habits: Home exercise routine Healthy diet Good water intake   Goals Addressed             This Visit's Progress    Follow up with Primary Care Provider       As needed        Depression Screen PHQ 2/9 Scores 01/21/2021 01/17/2021 01/15/2020 12/30/2018 05/09/2018 12/24/2017 12/11/2016  PHQ - 2 Score 0 0 0 0 0 0 0  PHQ- 9 Score - - - - - - 0    Fall Risk Fall Risk  01/17/2021 01/15/2020 05/31/2019 12/30/2018 05/09/2018  Falls in the past year? 1 0 0 0 No  Number  falls in past yr: 1 0 - - -  Injury with Fall? 0 0 - - -  Risk for fall due to : History of fall(s) - - - -  Follow up Falls evaluation completed Falls evaluation completed Falls evaluation completed - -    FALL RISK PREVENTION PERTAINING TO THE HOME: Handrails in use when climbing stairs? Yes ome free of loose throw rugs in walkways, pet beds, electrical cords, etc? Yes  Adequate lighting in your home to reduce risk of falls? Yes   ASSISTIVE DEVICES UTILIZED TO PREVENT FALLS:  Life alert? No  Use of a cane, walker or w/c? No   TIMED UP AND GO: Was the test performed? Yes .  Length of time to ambulate 10 feet: 10 sec.   Gait steady and fast without use of assistive device  Cognitive Function: Patient is alert and oriented x3.  Denies difficulty, focusing, making decisions, memory loss.  MMSE/6CIT deferred. Normal by direct communication/observation.  MMSE - Mini Mental State Exam 12/11/2016 12/12/2015  Orientation to time 5 5  Orientation to Place 5 5  Registration 3 3  Attention/ Calculation 5 5  Recall 3 3  Language- name 2 objects 2 2  Language- repeat 1 1  Language- follow 3 step command 3 3  Language- read & follow direction 1 1  Write a sentence 1 1  Copy design 1 1  Total score 30 30     6CIT Screen 01/15/2020 12/30/2018 12/24/2017  What Year? 0 points 0 points 0 points  What month? 0 points 0 points 0 points  What time? - 0 points 0 points  Count back from 20 - 0 points 0 points  Months in reverse 0 points 0 points 0 points  Repeat phrase 0 points 0 points 0 points  Total Score - 0 0    Immunizations Immunization History  Administered Date(s) Administered   Fluad Quad(high Dose 65+) 05/31/2019, 05/30/2020   Influenza Split 05/31/2012, 04/16/2014   Influenza, High Dose Seasonal PF  03/25/2016, 04/23/2017, 05/09/2018   Influenza-Unspecified 04/26/2012, 05/31/2012, 05/03/2013, 04/16/2014, 05/08/2015, 03/25/2016   Moderna SARS-COV2 Booster Vaccination 06/28/2020    Moderna Sars-Covid-2 Vaccination 09/23/2019, 10/21/2019   Pneumococcal Conjugate-13 05/30/2013   Pneumococcal Polysaccharide-23 12/12/2015   Zoster, Live 06/28/2012     Health Maintenance There are no preventive care reminders to display for this patient. Health Maintenance  Topic Date Due   COVID-19 Vaccine (4 - Booster for Moderna series) 02/02/2021 (Originally 10/27/2020)   Zoster Vaccines- Shingrix (1 of 2) 04/23/2021 (Originally 05/01/1990)   TETANUS/TDAP  01/21/2022 (Originally 12/29/2020)   FOOT EXAM  01/30/2021   INFLUENZA VACCINE  02/24/2021   MAMMOGRAM  05/17/2021   HEMOGLOBIN A1C  07/17/2021   OPHTHALMOLOGY EXAM  09/10/2021   DEXA SCAN  Completed   PNA vac Low Risk Adult  Completed   HPV VACCINES  Aged Out   Colonoscopy- 2017  Vision Screening: Recommended annual ophthalmology exams for early detection of glaucoma and other disorders of the eye.  Dental Screening: Recommended annual dental exams for proper oral hygiene  Community Resource Referral / Chronic Care Management: CRR required this visit?  No   CCM required this visit?  No      Plan:   Keep all routine maintenance appointments.   I have personally reviewed and noted the following in the patient's chart:   Medical and social history Use of alcohol, tobacco or illicit drugs  Current medications and supplements including opioid prescriptions. Patient is not currently taking opioid. Functional ability and status Nutritional status Physical activity Advanced directives List of other physicians Hospitalizations, surgeries, and ER visits in previous 12 months Vitals Screenings to include cognitive, depression, and falls Referrals and appointments  In addition, I have reviewed and discussed with patient certain preventive protocols, quality metrics, and best practice recommendations. A written personalized care plan for preventive services as well as general preventive health recommendations were  provided to patient via mychart.     Varney Biles, LPN   [id number]

## 2021-01-21 NOTE — Patient Instructions (Addendum)
Misty Jones , Thank you for taking time to come for your Medicare Wellness Visit. I appreciate your ongoing commitment to your health goals. Please review the following plan we discussed and let me know if I can assist you in the future.   These are the goals we discussed:  Goals       Patient Stated     Medication Management (pt-stated)      Patient Goals/Self-Care Activities Over the next 90 days, patient will:  - take medications as prescribed check glucose daily, document, and provide at future appointments collaborate with provider on medication access solutions        Other     Follow up with Primary Care Provider      As needed         This is a list of the screening recommended for you and due dates:  Health Maintenance  Topic Date Due   COVID-19 Vaccine (4 - Booster for Moderna series) 02/02/2021*   Zoster (Shingles) Vaccine (1 of 2) 04/23/2021*   Tetanus Vaccine  01/21/2022*   Complete foot exam   01/30/2021   Flu Shot  02/24/2021   Mammogram  05/17/2021   Hemoglobin A1C  07/17/2021   Eye exam for diabetics  09/10/2021   DEXA scan (bone density measurement)  Completed   Pneumonia vaccines  Completed   HPV Vaccine  Aged Out  *Topic was postponed. The date shown is not the original due date.    Advanced directives: End of life planning; Advance aging; Advanced directives discussed.  Copy of current HCPOA/Living Will requested.    Conditions/risks identified: none new  Follow up in one year for your annual wellness visit    Preventive Care 65 Years and Older, Female Preventive care refers to lifestyle choices and visits with your health care provider that can promote health and wellness. What does preventive care include? A yearly physical exam. This is also called an annual well check. Dental exams once or twice a year. Routine eye exams. Ask your health care provider how often you should have your eyes checked. Personal lifestyle choices,  including: Daily care of your teeth and gums. Regular physical activity. Eating a healthy diet. Avoiding tobacco and drug use. Limiting alcohol use. Practicing safe sex. Taking low-dose aspirin every day. Taking vitamin and mineral supplements as recommended by your health care provider. What happens during an annual well check? The services and screenings done by your health care provider during your annual well check will depend on your age, overall health, lifestyle risk factors, and family history of disease. Counseling  Your health care provider may ask you questions about your: Alcohol use. Tobacco use. Drug use. Emotional well-being. Home and relationship well-being. Sexual activity. Eating habits. History of falls. Memory and ability to understand (cognition). Work and work Statistician. Reproductive health. Screening  You may have the following tests or measurements: Height, weight, and BMI. Blood pressure. Lipid and cholesterol levels. These may be checked every 5 years, or more frequently if you are over 29 years old. Skin check. Lung cancer screening. You may have this screening every year starting at age 55 if you have a 30-pack-year history of smoking and currently smoke or have quit within the past 15 years. Fecal occult blood test (FOBT) of the stool. You may have this test every year starting at age 75. Flexible sigmoidoscopy or colonoscopy. You may have a sigmoidoscopy every 5 years or a colonoscopy every 10 years starting at age 76.  Hepatitis C blood test. Hepatitis B blood test. Sexually transmitted disease (STD) testing. Diabetes screening. This is done by checking your blood sugar (glucose) after you have not eaten for a while (fasting). You may have this done every 1-3 years. Bone density scan. This is done to screen for osteoporosis. You may have this done starting at age 23. Mammogram. This may be done every 1-2 years. Talk to your health care provider  about how often you should have regular mammograms. Talk with your health care provider about your test results, treatment options, and if necessary, the need for more tests. Vaccines  Your health care provider may recommend certain vaccines, such as: Influenza vaccine. This is recommended every year. Tetanus, diphtheria, and acellular pertussis (Tdap, Td) vaccine. You may need a Td booster every 10 years. Zoster vaccine. You may need this after age 80. Pneumococcal 13-valent conjugate (PCV13) vaccine. One dose is recommended after age 68. Pneumococcal polysaccharide (PPSV23) vaccine. One dose is recommended after age 5. Talk to your health care provider about which screenings and vaccines you need and how often you need them. This information is not intended to replace advice given to you by your health care provider. Make sure you discuss any questions you have with your health care provider. Document Released: 08/09/2015 Document Revised: 04/01/2016 Document Reviewed: 05/14/2015 Elsevier Interactive Patient Education  2017 [place] Prevention in the Home Falls can cause injuries. They can happen to people of all ages. There are many things you can do to make your home safe and to help prevent falls. What can I do on the outside of my home? Regularly fix the edges of walkways and driveways and fix any cracks. Remove anything that might make you trip as you walk through a door, such as a raised step or threshold. Trim any bushes or trees on the path to your home. Use bright outdoor lighting. Clear any walking paths of anything that might make someone trip, such as rocks or tools. Regularly check to see if handrails are loose or broken. Make sure that both sides of any steps have handrails. Any raised decks and porches should have guardrails on the edges. Have any leaves, snow, or ice cleared regularly. Use sand or salt on walking paths during winter. Clean up any spills in  your garage right away. This includes oil or grease spills. What can I do in the bathroom? Use night lights. Install grab bars by the toilet and in the tub and shower. Do not use towel bars as grab bars. Use non-skid mats or decals in the tub or shower. If you need to sit down in the shower, use a plastic, non-slip stool. Keep the floor dry. Clean up any water that spills on the floor as soon as it happens. Remove soap buildup in the tub or shower regularly. Attach bath mats securely with double-sided non-slip rug tape. Do not have throw rugs and other things on the floor that can make you trip. What can I do in the bedroom? Use night lights. Make sure that you have a light by your bed that is easy to reach. Do not use any sheets or blankets that are too big for your bed. They should not hang down onto the floor. Have a firm chair that has side arms. You can use this for support while you get dressed. Do not have throw rugs and other things on the floor that can make you trip. What can I do in  the kitchen? Clean up any spills right away. Avoid walking on wet floors. Keep items that you use a lot in easy-to-reach places. If you need to reach something above you, use a strong step stool that has a grab bar. Keep electrical cords out of the way. Do not use floor polish or wax that makes floors slippery. If you must use wax, use non-skid floor wax. Do not have throw rugs and other things on the floor that can make you trip. What can I do with my stairs? Do not leave any items on the stairs. Make sure that there are handrails on both sides of the stairs and use them. Fix handrails that are broken or loose. Make sure that handrails are as long as the stairways. Check any carpeting to make sure that it is firmly attached to the stairs. Fix any carpet that is loose or worn. Avoid having throw rugs at the top or bottom of the stairs. If you do have throw rugs, attach them to the floor with carpet  tape. Make sure that you have a light switch at the top of the stairs and the bottom of the stairs. If you do not have them, ask someone to add them for you. What else can I do to help prevent falls? Wear shoes that: Do not have high heels. Have rubber bottoms. Are comfortable and fit you well. Are closed at the toe. Do not wear sandals. If you use a stepladder: Make sure that it is fully opened. Do not climb a closed stepladder. Make sure that both sides of the stepladder are locked into place. Ask someone to hold it for you, if possible. Clearly mark and make sure that you can see: Any grab bars or handrails. First and last steps. Where the edge of each step is. Use tools that help you move around (mobility aids) if they are needed. These include: Canes. Walkers. Scooters. Crutches. Turn on the lights when you go into a dark area. Replace any light bulbs as soon as they burn out. Set up your furniture so you have a clear path. Avoid moving your furniture around. If any of your floors are uneven, fix them. If there are any pets around you, be aware of where they are. Review your medicines with your doctor. Some medicines can make you feel dizzy. This can increase your chance of falling. Ask your doctor what other things that you can do to help prevent falls. This information is not intended to replace advice given to you by your health care provider. Make sure you discuss any questions you have with your health care provider. Document Released: 05/09/2009 Document Revised: 12/19/2015 Document Reviewed: 08/17/2014 Elsevier Interactive Patient Education  2017 [employer].

## 2021-01-28 ENCOUNTER — Ambulatory Visit (INDEPENDENT_AMBULATORY_CARE_PROVIDER_SITE_OTHER): Payer: Medicare Other | Admitting: Dermatology

## 2021-01-28 ENCOUNTER — Other Ambulatory Visit: Payer: Self-pay

## 2021-01-28 DIAGNOSIS — L820 Inflamed seborrheic keratosis: Secondary | ICD-10-CM | POA: Diagnosis not present

## 2021-01-28 DIAGNOSIS — D229 Melanocytic nevi, unspecified: Secondary | ICD-10-CM | POA: Diagnosis not present

## 2021-01-28 DIAGNOSIS — R21 Rash and other nonspecific skin eruption: Secondary | ICD-10-CM

## 2021-01-28 DIAGNOSIS — Z808 Family history of malignant neoplasm of other organs or systems: Secondary | ICD-10-CM

## 2021-01-28 DIAGNOSIS — L578 Other skin changes due to chronic exposure to nonionizing radiation: Secondary | ICD-10-CM | POA: Diagnosis not present

## 2021-01-28 DIAGNOSIS — D2239 Melanocytic nevi of other parts of face: Secondary | ICD-10-CM

## 2021-01-28 DIAGNOSIS — L821 Other seborrheic keratosis: Secondary | ICD-10-CM

## 2021-01-28 NOTE — Patient Instructions (Addendum)
If you have any questions or concerns for your doctor, please call our main line at [phone] and press option 4 to reach your doctor's medical assistant. If no one answers, please leave a voicemail as directed and we will return your call as soon as possible. Messages left after 4 pm will be answered the following business day.   You may also send [place] a message via [place]. We typically respond to MyChart messages within 1-2 business days.  For prescription refills, please ask your pharmacy to contact our office. Our fax number is [phone].  If you have an urgent issue when the clinic is closed that cannot wait until the next business day, you can page your doctor at the number below.    Please note that while we do our best to be available for urgent issues outside of office hours, we are not available 24/7.   If you have an urgent issue and are unable to reach [place], you may choose to seek medical care at your doctor's office, retail clinic, urgent care center, or emergency room.  If you have a medical emergency, please immediately call 911 or go to the emergency department.  Pager Numbers  - Dr. Nehemiah Massed: [phone]  - Dr. Laurence Ferrari: [phone]  - Dr. Nicole Kindred: [phone]  In the event of inclement weather, please call our main line at [phone] for an update on the status of any delays or closures.  Dermatology Medication Tips: Please keep the boxes that topical medications come in in order to help keep track of the instructions about where and how to use these. Pharmacies typically print the medication instructions only on the boxes and not directly on the medication tubes.  Recommend daily broad spectrum sunscreen SPF 30+ to sun-exposed areas, reapply every 2 hours as needed. Call for new or changing lesions.  Staying in the shade or wearing long sleeves, sun glasses (UVA+UVB protection) and wide brim hats (4-inch brim around the entire circumference of the hat) are also  recommended for sun protection.    Seborrheic Keratosis  What causes seborrheic keratoses? Seborrheic keratoses are harmless, common skin growths that first appear during adult life.  As time goes by, more growths appear.  Some people may develop a large number of them.  Seborrheic keratoses appear on both covered and uncovered body parts.  They are not caused by sunlight.  The tendency to develop seborrheic keratoses can be inherited.  They vary in color from skin-colored to gray, brown, or even black.  They can be either smooth or have a rough, warty surface.   Seborrheic keratoses are superficial and look as if they were stuck on the skin.  Under the microscope this type of keratosis looks like layers upon layers of skin.  That is why at times the top layer may seem to fall off, but the rest of the growth remains and re-grows.    Treatment Seborrheic keratoses do not need to be treated, but can easily be removed in the office.  Seborrheic keratoses often cause symptoms when they rub on clothing or jewelry.  Lesions can be in the way of shaving.  If they become inflamed, they can cause itching, soreness, or burning.  Removal of a seborrheic keratosis can be accomplished by freezing, burning, or surgery. If any spot bleeds, scabs, or grows rapidly, please return to have it checked, as these can be an indication of a skin cancer.   If your medication is too expensive, please contact our office at  [phone] option 4 or send [place] a message through [place].   We are unable to tell what your co-pay for medications will be in advance as this is different depending on your insurance coverage. However, we may be able to find a substitute medication at lower cost or fill out paperwork to get insurance to cover a needed medication.   If a prior authorization is required to get your medication covered by your insurance company, please allow [place] 1-2 business days to complete this process.  Drug prices often  vary depending on where the prescription is filled and some pharmacies may offer cheaper prices.  The website www.goodrx.com contains coupons for medications through different pharmacies. The prices here do not account for what the cost may be with help from insurance (it may be cheaper with your insurance), but the website can give you the price if you did not use any insurance.  - You can print the associated coupon and take it with your prescription to the pharmacy.  - You may also stop by our office during regular business hours and pick up a GoodRx coupon card.  - If you need your prescription sent electronically to a different pharmacy, notify our office through [facility] or by phone at [phone] option 4.

## 2021-01-28 NOTE — Progress Notes (Signed)
   New Patient Visit  Subjective  Misty Jones is a 80 y.o. female who presents for the following: New Patient (Initial Visit) (Patient here today for exam. She reports she has a family history of skin cancer in both sisters and her mom has had mouth cancer. Patient states the place above left side of left is not present. She still feels some tingling sometime. Patient reports another spot on left cheek. Patient she reports several place on nose and face she would like checked. Patient also reports a spot at bra she would like checked. ).  The following portions of the chart were reviewed this encounter and updated as appropriate:   Tobacco  Allergies  Meds  Problems  Med Hx  Surg Hx  Fam Hx      Review of Systems:  No other skin or systemic complaints except as noted in HPI or Assessment and Plan.  Objective  Well appearing patient in no apparent distress; mood and affect are within normal limits.  A focused examination was performed including face, nose, left upper lip, back, neck. Relevant physical exam findings are noted in the Assessment and Plan.  left upper lip and left cheek Erythematous macules at left upper lip and left cheek   Nose Fibrous papule   Mid Back Erythematous keratotic or waxy stuck-on papule or plaque.    Assessment & Plan  Rash and other nonspecific skin eruption left upper lip and left cheek  Unclear etiology   Benign-appearing.  Observation.  Call clinic for new or changing lesions.    Fibrous papule of nose Nose  Benign-appearing.  Will recheck at follow-up. Call clinic for new or changing lesions.  Recommend daily use of broad spectrum spf 30+ sunscreen to sun-exposed areas.   Inflamed seborrheic keratosis Mid Back  Patient is not bothered by and defers treatment  Benign-appearing.  Observation.  Call clinic for new or changing lesions.  Recommend daily use of broad spectrum spf 30+ sunscreen to sun-exposed areas.    Hemangiomas -  Red papules at back - Discussed benign nature - Observe - Call for any changes  Seborrheic Keratoses - Stuck-on, waxy, tan-brown papules and/or plaques neck, back - Benign-appearing - Discussed benign etiology and prognosis. - Observe - Call for any changes  Melanocytic Nevi - Tan-brown and/or pink-flesh-colored symmetric macules and papules at back - Benign appearing on exam today - Observation - Call clinic for new or changing moles - Recommend daily use of broad spectrum spf 30+ sunscreen to sun-exposed areas.   Actinic Damage - chronic, secondary to cumulative UV radiation exposure/sun exposure over time - diffuse scaly erythematous macules with underlying dyspigmentation - Recommend daily broad spectrum sunscreen SPF 30+ to sun-exposed areas, reapply every 2 hours as needed.  - Recommend staying in the shade or wearing long sleeves, sun glasses (UVA+UVB protection) and wide brim hats (4-inch brim around the entire circumference of the hat). - Call for new or changing lesions.  Return for 3 months fbse .  I, Ruthell Rummage, CMA, am acting as scribe for Forest Gleason, MD.   Documentation: I have reviewed the above documentation for accuracy and completeness, and I agree with the above.  Forest Gleason, MD

## 2021-01-30 ENCOUNTER — Ambulatory Visit (INDEPENDENT_AMBULATORY_CARE_PROVIDER_SITE_OTHER): Payer: Medicare Other | Admitting: Pharmacist

## 2021-01-30 ENCOUNTER — Telehealth: Payer: Self-pay | Admitting: Pharmacist

## 2021-01-30 DIAGNOSIS — E1165 Type 2 diabetes mellitus with hyperglycemia: Secondary | ICD-10-CM | POA: Diagnosis not present

## 2021-01-30 DIAGNOSIS — I700 Atherosclerosis of aorta: Secondary | ICD-10-CM

## 2021-01-30 DIAGNOSIS — I10 Essential (primary) hypertension: Secondary | ICD-10-CM

## 2021-01-30 DIAGNOSIS — E7800 Pure hypercholesterolemia, unspecified: Secondary | ICD-10-CM | POA: Diagnosis not present

## 2021-01-30 NOTE — Chronic Care Management (AMB) (Signed)
Chronic Care Management Pharmacy Note  01/30/2021 Name:  Misty Jones MRN:  [id number] DOB:  07/03/1941   Subjective: Misty Jones is an 80 y.o. year old female who is a primary patient of [name], MD.  The CCM team was consulted for assistance with disease management and care coordination needs.    Engaged with patient by telephone for  response to call regarding medications  in response to provider referral for pharmacy case management and/or care coordination services.   Consent to Services:  The patient was given information about Chronic Care Management services, agreed to services, and gave verbal consent prior to initiation of services.  Please see initial visit note for detailed documentation.   Patient Care Team: [name], MD as PCP - General (Internal Medicine) [name], MD (Internal Medicine)   Objective:  Lab Results  Component Value Date   CREATININE 0.87 01/15/2021   CREATININE 0.95 10/15/2020   CREATININE 0.86 05/30/2020    Lab Results  Component Value Date   HGBA1C 7.0 (H) 01/15/2021   Last diabetic Eye exam:  Lab Results  Component Value Date/Time   HMDIABEYEEXA No Retinopathy 05/31/2019 12:00 AM    Last diabetic Foot exam:  Lab Results  Component Value Date/Time   HMDIABFOOTEX on my exam 01/31/2020 12:00 AM        Component Value Date/Time   CHOL 143 01/15/2021 0853   CHOL 209 (H) 04/03/2012 0133   TRIG 166.0 (H) 01/15/2021 0853   TRIG 103 04/03/2012 0133   HDL 62.60 01/15/2021 0853   HDL 52 04/03/2012 0133   CHOLHDL 2 01/15/2021 0853   VLDL 33.2 01/15/2021 0853   VLDL 21 04/03/2012 0133   LDLCALC 48 01/15/2021 0853   LDLCALC 136 (H) 04/03/2012 0133   LDLDIRECT 142.0 10/15/2020 0819    Hepatic Function Latest Ref Rng & Units 01/15/2021 10/15/2020 05/30/2020  Total Protein 6.0 - 8.3 g/dL 6.0 6.3 6.2  Albumin 3.5 - 5.2 g/dL 4.0 4.0 4.1  AST 0 - 37 U/L 13 13 14   ALT 0 - 35 U/L 13 14 15   Alk Phosphatase 39 - 117  U/L 67 64 62  Total Bilirubin 0.2 - 1.2 mg/dL 0.5 0.4 0.6  Bilirubin, Direct 0.0 - 0.3 mg/dL 0.1 0.1 0.1    Lab Results  Component Value Date/Time   TSH 4.06 10/15/2020 08:19 AM   TSH 5.27 (H) 05/30/2020 08:25 AM    CBC Latest Ref Rng & Units 05/30/2020 05/30/2019 05/04/2018  WBC 4.0 - 10.5 K/uL 8.6 8.2 7.3  Hemoglobin 12.0 - 15.0 g/dL 13.2 13.2 13.1  Hematocrit 36.0 - 46.0 % 39.4 40.8 39.7  Platelets 150.0 - 400.0 K/uL 334.0 327.0 319.0    Lab Results  Component Value Date/Time   VD25OH 53.86 02/26/2014 02:19 PM   VD25OH 47 01/24/2013 09:24 AM    Clinical ASCVD: Yes  - aortic atherosclerosis  The ASCVD Risk score ([place]., et al., 2013) failed to calculate for the following reasons:   The 2013 ASCVD risk score is only valid for ages 59 to 10     Social History   Tobacco Use  Smoking Status Former   Packs/day: 0.50   Years: 20.00   Pack years: 10.00   Types: Cigarettes   Quit date: 07/26/1984   Years since quitting: 36.5  Smokeless Tobacco Never  Tobacco Comments   quit smoking years ago   BP Readings from Last 3 Encounters:  01/21/21 126/82  01/17/21 124/82  10/17/20 120/70   Pulse Readings from Last 3 Encounters:  01/17/21 83  10/17/20 90  07/17/20 86   Wt Readings from Last 3 Encounters:  01/21/21 202 lb (91.6 kg)  01/17/21 202 lb 9.6 oz (91.9 kg)  10/17/20 203 lb 6.4 oz (92.3 kg)    Assessment: Review of patient past medical history, allergies, medications, health status, including review of consultants reports, laboratory and other test data, was performed as part of comprehensive evaluation and provision of chronic care management services.   SDOH:  (Social Determinants of Health) assessments and interventions performed:  SDOH Interventions    Flowsheet Row Most Recent Value  SDOH Interventions   Financial Strain Interventions Other (Comment)  [manufacturer assistance]       CCM Care Plan  Allergies  Allergen Reactions   Augmentin  [Amoxicillin-Pot Clavulanate] Other (See Comments)    vomiting   Protonix [Pantoprazole Sodium]     Medications Reviewed Today     Reviewed by Paul Dykes, CMA (Certified Medical Assistant) on 01/28/21 at 1047  Med List Status: <None>   Medication Order Taking? Sig Documenting Provider Last Dose Status Informant  aspirin 81 MG tablet [id number] Yes Take 81 mg by mouth daily. [provider] Taking Active Self           Med Note Darnelle Maffucci, Oktober Glazer E   Thu Nov 09, 2019 11:02 AM)    benazepril (LOTENSIN) 40 MG tablet [id number] Yes Take 1 tablet by mouth once daily [name], MD Taking Active   Calcium Carbonate-Vitamin D 600-400 MG-UNIT tablet [id number] Yes Take 1 tablet by mouth 2 (two) times daily. [provider] Taking Active Self  Cholecalciferol (VITAMIN D) 50 MCG (2000 UT) CAPS [id number] Yes Take 1 capsule (2,000 Units total) by mouth daily. [provider] Taking Active Self  cyanocobalamin (,VITAMIN B-12,) 1000 MCG/ML injection [id number] Yes INJECT 1 ML INTRAMUSCULARLY ONCE EVERY MONTH Scott, Randell Patient, MD Taking Active   Evolocumab [facility] SURECLICK) 824 MG/ML Darden Palmer [id number] Yes Inject 140 mg into the skin every 14 (fourteen) days. [name], MD Taking Active   glucose blood (CONTOUR TEST) test strip [id number] Yes USE 1 STRIP TO CHECK GLUCOSE ONCE DAILY. Dx E11.9 [name], MD Taking Active   hydrochlorothiazide (HYDRODIURIL) 25 MG tablet [id number] Yes Take 1/2 (one-half) tablet by mouth once daily [name], MD Taking Active   Lancets MISC [id number] Yes Check sugars once a day Dx: E11.9 (Contour Next) [name], MD Taking Active Self  metFORMIN (GLUCOPHAGE XR) 500 MG 24 hr tablet [id number] Yes Take 1 tablet (500 mg total) by mouth 2 (two) times daily. [name], MD Taking Active   mupirocin ointment (BACTROBAN) 2 % [id number] Yes Apply to affected area bid [name], MD Taking Active   Omega-3 1000 MG CAPS [id number]  Yes Take 1,000 mg by mouth daily. [provider] Taking Active   omeprazole (PRILOSEC) 20 MG capsule [id number] Yes Take 20 mg by mouth daily. [provider] Taking Active             Patient Active Problem List   Diagnosis Date Noted   Skin lesion 01/18/2021   Leg pain, bilateral 01/18/2021   Numbness in feet 01/18/2021   Elevated TSH 07/24/2020   Pain in scapula 06/05/2020   Right knee pain 06/05/2020   Adverse effect of statin 03/11/2020   Headache 01/31/2020   B12 deficiency 01/31/2020   Dysuria 08/20/2019   Change in bowel movement 08/20/2019   Nocturia 01/29/2019  Low back pain 12/30/2017   Herpes genitalis 08/29/2017   Dizziness 04/25/2017   Rash 01/28/2017   Aortic atherosclerosis ([place]) 12/19/2016   Chest pain 09/27/2016   History of colonic polyps 08/09/2016   Abdominal pain 12/04/2015   Neck nodule 03/01/2015   Health care maintenance 11/03/2014   BMI 34.0-34.9,adult 07/01/2014   Right shoulder pain 06/28/2014   Cough 02/26/2014   Osteopenia 01/25/2013   Hypertension 06/19/2012   GERD (gastroesophageal reflux disease) 06/19/2012   Diabetes mellitus ([place]) 06/07/2012   Hypercholesterolemia 06/07/2012    Immunization History  Administered Date(s) Administered   Fluad Quad(high Dose 65+) 05/31/2019, 05/30/2020   Influenza Split 05/31/2012, 04/16/2014   Influenza, High Dose Seasonal PF 03/25/2016, 04/23/2017, 05/09/2018   Influenza-Unspecified 04/26/2012, 05/31/2012, 05/03/2013, 04/16/2014, 05/08/2015, 03/25/2016   Moderna SARS-COV2 Booster Vaccination 06/28/2020   Moderna Sars-Covid-2 Vaccination 09/23/2019, 10/21/2019   Pneumococcal Conjugate-13 05/30/2013   Pneumococcal Polysaccharide-23 12/12/2015   Zoster, Live 06/28/2012    Conditions to be addressed/monitored: HLD and DMII  Care Plan : Medication Management  Updates made by De Hollingshead, RPH-CPP since 01/30/2021 12:00 AM     Problem: Hyperlipidemia, Diabetes       Long-Range Goal: Disease Progression Prevention   Start Date: 11/19/2020  This Visit's Progress: On track  Recent Progress: On track  Priority: High  Note:   Current Barriers:  Unable to independently afford treatment regimen Unable to achieve control of cholesterol   Pharmacist Clinical Goal(s):  Over the next 90 days, patient will achieve improvement in cholesterol through collaboration with PharmD and provider.   Interventions: 1:1 collaboration with [name], MD regarding development and update of comprehensive plan of care as evidenced by provider attestation and co-signature Inter-disciplinary care team collaboration (see longitudinal plan of care) Comprehensive medication review performed; medication list updated in electronic medical record  SDOH: Medicare A/B. No prescription drug insurance. Denies medication cost concerns (besides Repatha)  Health Maintenance: Due for DM eye exam. Will remind moving forward.  COVID vaccination x 3  Diabetes: Controlled per last lab work; current treatment: metformin XR 500 mg twice daily Previously recommended to continue current regimen at this time  Hypertension: Controlled per last clinic reading; current treatment: benazepril 40 mg daily, HCTZ 12.5 mg daily. Previously recommended to continue current regimen at this time  Hyperlipidemia and ASCVD risk reduction: Improved on Repatha therapy; current treatment: OTC niacin 500 mg daily, OTC omega 3 fatty acids 1000 mg daily, Repatha 140 mcg Q14 days Calls today to report that starting last week, she is experiencing full body aches in both her muscles and joints. Denies any associated fever or respiratory symptoms, no abdominal pain. Notes that she has been taking acetaminophen to treat. Relieved by sitting still. Has taken 4 doses of Repatha.  Medications previously tried: simvastatin, atorvastatin, rosuvastatin, pravastatin, ezetimibe; muscle pains Antiplatelet regimen:  aspirin 81 mg daily Agree with patient to hold Repatha at this time. If muscle aches/pains resolve, then we will avoid resuming Repatha. If they do not or worsen, encouraged her to contact the office for work up for alternative causes. Patient verbalizes understanding.   Supplements: Calcium + vitamin D, Vitamin D, Vitamin B12 IM monthly, melatonin QPM PRN  Patient Goals/Self-Care Activities Over the next 90 days, patient will:  - take medications as prescribed check glucose daily, document, and provide at future appointments collaborate with provider on medication access solutions  Follow Up Plan: Telephone follow up appointment with care management team member scheduled for: 4 weeks  Medication Assistance:  Repatha obtained through [place] medication assistance program.  Enrollment ends 07/26/21  Patient's preferred pharmacy is:  [facility] [address], [place] - [place] [address] Phone: [phone] Fax: [phone]  [id number] [place] [address]: [phone]: [phone] [place] [address] [address] [facility] [place] [address]Phone: [phone]: [phone]   Follow Up:  Patient agrees to Care Plan and Follow-up.  Plan: Telephone follow up appointment with care management team member scheduled for:  ~ 4 weeks  Catie TDarnelle Maffucci PharmD, BD[place] CTroutdaleClinical Pharmacist LOccidental Petroleumat BJohnson [employer] [phone]

## 2021-01-30 NOTE — Telephone Encounter (Signed)
  Chronic Care Management   Note  01/30/2021 Name: Misty Jones MRN: [id number] DOB: [birth year]   Received voicemail from patient that she had a question regarding Repatha. Called patient back and left voicemail for her to return my call at her convenience.   Catie Darnelle Maffucci, PharmD, [place], [place] Clinical Pharmacist [employer] at [place]

## 2021-01-30 NOTE — Patient Instructions (Signed)
Visit Information  PATIENT GOALS:  Goals Addressed               This Visit's Progress     Patient Stated     Medication Management (pt-stated)        Patient Goals/Self-Care Activities Over the next 90 days, patient will:  - take medications as prescribed check glucose daily, document, and provide at future appointments collaborate with provider on medication access solutions          Patient verbalizes understanding of instructions provided today and agrees to view in [place].   Plan: Telephone follow up appointment with care management team member scheduled for:  ~ 4 weeks  Catie Darnelle Maffucci, PharmD, [place], [place] Clinical Pharmacist [employer] at [employer] [phone]

## 2021-01-30 NOTE — Telephone Encounter (Signed)
See CCM documentation 

## 2021-02-07 ENCOUNTER — Telehealth: Payer: Medicare Other

## 2021-02-10 ENCOUNTER — Encounter: Payer: Self-pay | Admitting: Dermatology

## 2021-02-26 ENCOUNTER — Ambulatory Visit (INDEPENDENT_AMBULATORY_CARE_PROVIDER_SITE_OTHER): Payer: Medicare Other | Admitting: Pharmacist

## 2021-02-26 DIAGNOSIS — E1165 Type 2 diabetes mellitus with hyperglycemia: Secondary | ICD-10-CM

## 2021-02-26 DIAGNOSIS — I10 Essential (primary) hypertension: Secondary | ICD-10-CM

## 2021-02-26 DIAGNOSIS — I700 Atherosclerosis of aorta: Secondary | ICD-10-CM

## 2021-02-26 DIAGNOSIS — T466X5A Adverse effect of antihyperlipidemic and antiarteriosclerotic drugs, initial encounter: Secondary | ICD-10-CM

## 2021-02-26 DIAGNOSIS — M791 Myalgia, unspecified site: Secondary | ICD-10-CM

## 2021-02-26 NOTE — Patient Instructions (Signed)
Visit Information  PATIENT GOALS:  Goals Addressed               This Visit's Progress     Patient Stated     Medication Management (pt-stated)        Patient Goals/Self-Care Activities Over the next 90 days, patient will:  - take medications as prescribed check glucose daily, document, and provide at future appointments collaborate with provider on medication access solutions         Patient verbalizes understanding of instructions provided today and agrees to view in [place].   Plan: Telephone follow up appointment with care management team member scheduled for:  ~ 4 months  Catie Darnelle Maffucci, PharmD, [place], [place] Clinical Pharmacist [employer] at [employer] [phone]

## 2021-02-26 NOTE — Chronic Care Management (AMB) (Signed)
Chronic Care Management Pharmacy Note  02/26/2021 Name:  Misty Jones MRN:  [id number] DOB:  09/15/1940  Subjective: Misty Jones is an 80 y.o. year old female who is a primary patient of [name], MD.  The CCM team was consulted for assistance with disease management and care coordination needs.    Engaged with patient by telephone for follow up visit in response to provider referral for pharmacy case management and/or care coordination services.   Consent to Services:  The patient was given information about Chronic Care Management services, agreed to services, and gave verbal consent prior to initiation of services.  Please see initial visit note for detailed documentation.   Patient Care Team: [name], MD as PCP - General (Internal Medicine) [name], MD (Internal Medicine)  Objective:  Lab Results  Component Value Date   CREATININE 0.87 01/15/2021   CREATININE 0.95 10/15/2020   CREATININE 0.86 05/30/2020    Lab Results  Component Value Date   HGBA1C 7.0 (H) 01/15/2021   Last diabetic Eye exam:  Lab Results  Component Value Date/Time   HMDIABEYEEXA No Retinopathy 05/31/2019 12:00 AM    Last diabetic Foot exam:  Lab Results  Component Value Date/Time   HMDIABFOOTEX on my exam 01/31/2020 12:00 AM        Component Value Date/Time   CHOL 143 01/15/2021 0853   CHOL 209 (H) 04/03/2012 0133   TRIG 166.0 (H) 01/15/2021 0853   TRIG 103 04/03/2012 0133   HDL 62.60 01/15/2021 0853   HDL 52 04/03/2012 0133   CHOLHDL 2 01/15/2021 0853   VLDL 33.2 01/15/2021 0853   VLDL 21 04/03/2012 0133   LDLCALC 48 01/15/2021 0853   LDLCALC 136 (H) 04/03/2012 0133   LDLDIRECT 142.0 10/15/2020 0819    Hepatic Function Latest Ref Rng & Units 01/15/2021 10/15/2020 05/30/2020  Total Protein 6.0 - 8.3 g/dL 6.0 6.3 6.2  Albumin 3.5 - 5.2 g/dL 4.0 4.0 4.1  AST 0 - 37 U/L _0 ALT 0 - 35 U/L _1 Alk Phosphatase 39 - 117 U/L 67 64 62  Total Bilirubin  0.2 - 1.2 mg/dL 0.5 0.4 0.6  Bilirubin, Direct 0.0 - 0.3 mg/dL 0.1 0.1 0.1    Lab Results  Component Value Date/Time   TSH 4.06 10/15/2020 08:19 AM   TSH 5.27 (H) 05/30/2020 08:25 AM    CBC Latest Ref Rng & Units 05/30/2020 05/30/2019 05/04/2018  WBC 4.0 - 10.5 K/uL 8.6 8.2 7.3  Hemoglobin 12.0 - 15.0 g/dL 13.2 13.2 13.1  Hematocrit 36.0 - 46.0 % 39.4 40.8 39.7  Platelets 150.0 - 400.0 K/uL 334.0 327.0 319.0    Lab Results  Component Value Date/Time   VD25OH 53.86 02/26/2014 02:19 PM   VD25OH 47 01/24/2013 09:24 AM    Clinical ASCVD: No  The ASCVD Risk score ([place]., et al., 2013) failed to calculate for the following reasons:   The 2013 ASCVD risk score is only valid for ages 33 to 76    Other: (CHADS2VASc if Afib, PHQ9 if depression, MMRC or CAT for COPD, ACT, DEXA)  Social History   Tobacco Use  Smoking Status Former   Packs/day: 0.50   Years: 20.00   Pack years: 10.00   Types: Cigarettes   Quit date: 07/26/1984   Years since quitting: 36.6  Smokeless Tobacco Never  Tobacco Comments   quit smoking years ago   BP Readings from Last 3 Encounters:  01/21/21 126/82  01/17/21 124/82  10/17/20 120/70   Pulse Readings from Last 3 Encounters:  01/17/21 83  10/17/20 90  07/17/20 86   Wt Readings from Last 3 Encounters:  01/21/21 202 lb (91.6 kg)  01/17/21 202 lb 9.6 oz (91.9 kg)  10/17/20 203 lb 6.4 oz (92.3 kg)    Assessment: Review of patient past medical history, allergies, medications, health status, including review of consultants reports, laboratory and other test data, was performed as part of comprehensive evaluation and provision of chronic care management services.   SDOH:  (Social Determinants of Health) assessments and interventions performed:  SDOH Interventions    Flowsheet Row Most Recent Value  SDOH Interventions   Financial Strain Interventions Intervention Not Indicated       CCM Care Plan  Allergies  Allergen Reactions    Augmentin [Amoxicillin-Pot Clavulanate] Other (See Comments)    vomiting   Protonix [Pantoprazole Sodium]     Medications Reviewed Today     Reviewed by De Hollingshead, RPH-CPP (Pharmacist) on 02/26/21 at 1008  Med List Status: <None>   Medication Order Taking? Sig Documenting Provider Last Dose Status Informant  aspirin 81 MG tablet [id number] Yes Take 81 mg by mouth daily. [provider] Taking Active Self           Med Note Darnelle Maffucci, Lyman Balingit E   Thu Nov 09, 2019 11:02 AM)    benazepril (LOTENSIN) 40 MG tablet [id number] Yes Take 1 tablet by mouth once daily [name], MD Taking Active   Calcium Carbonate-Vitamin D 600-400 MG-UNIT tablet [id number] Yes Take 1 tablet by mouth 2 (two) times daily. [provider] Taking Active Self  Cholecalciferol (VITAMIN D) 50 MCG (2000 UT) CAPS [id number] Yes Take 1 capsule (2,000 Units total) by mouth daily. [provider] Taking Active Self  cyanocobalamin (,VITAMIN B-12,) 1000 MCG/ML injection [id number] Yes INJECT 1 ML INTRAMUSCULARLY ONCE EVERY MONTH [name], MD Taking Active   glucose blood (CONTOUR TEST) test strip [id number] Yes USE 1 STRIP TO CHECK GLUCOSE ONCE DAILY. Dx E11.9 [name], MD Taking Active   hydrochlorothiazide (HYDRODIURIL) 25 MG tablet [id number] Yes Take 1/2 (one-half) tablet by mouth once daily [name], MD Taking Active   Lancets MISC [id number]  Check sugars once a day Dx: E11.9 (Contour Next) [name], MD  Active Self  metFORMIN (GLUCOPHAGE XR) 500 MG 24 hr tablet [id number] Yes Take 1 tablet (500 mg total) by mouth 2 (two) times daily. [name], MD Taking Active   mupirocin ointment (BACTROBAN) 2 % [id number] Yes Apply to affected area bid [name], MD Taking Active   Omega-3 1000 MG CAPS [id number] Yes Take 1,000 mg by mouth daily. [provider] Taking Active   omeprazole (PRILOSEC) 20 MG capsule [id number] Yes Take 20 mg by mouth daily.  [provider] Taking Active             Patient Active Problem List   Diagnosis Date Noted   Skin lesion 01/18/2021   Leg pain, bilateral 01/18/2021   Numbness in feet 01/18/2021   Elevated TSH 07/24/2020   Pain in scapula 06/05/2020   Right knee pain 06/05/2020   Adverse effect of statin 03/11/2020   Headache 01/31/2020   B12 deficiency 01/31/2020   Dysuria 08/20/2019   Change in bowel movement 08/20/2019   Nocturia 01/29/2019   Low back pain 12/30/2017   Herpes genitalis 08/29/2017   Dizziness 04/25/2017   Rash 01/28/2017   Aortic atherosclerosis ([place]) 12/19/2016   Chest  pain 09/27/2016   History of colonic polyps 08/09/2016   Abdominal pain 12/04/2015   Neck nodule 03/01/2015   Health care maintenance 11/03/2014   BMI 34.0-34.9,adult 07/01/2014   Right shoulder pain 06/28/2014   Cough 02/26/2014   Osteopenia 01/25/2013   Hypertension 06/19/2012   GERD (gastroesophageal reflux disease) 06/19/2012   Diabetes mellitus ([place]) 06/07/2012   Hypercholesterolemia 06/07/2012    Immunization History  Administered Date(s) Administered   Fluad Quad(high Dose 65+) 05/31/2019, 05/30/2020   Influenza Split 05/31/2012, 04/16/2014   Influenza, High Dose Seasonal PF 03/25/2016, 04/23/2017, 05/09/2018   Influenza-Unspecified 04/26/2012, 05/31/2012, 05/03/2013, 04/16/2014, 05/08/2015, 03/25/2016   Moderna SARS-COV2 Booster Vaccination 06/28/2020   Moderna Sars-Covid-2 Vaccination 09/23/2019, 10/21/2019   Pneumococcal Conjugate-13 05/30/2013   Pneumococcal Polysaccharide-23 12/12/2015   Zoster, Live 06/28/2012    Conditions to be addressed/monitored: HTN, HLD, and DMII  Care Plan : Medication Management  Updates made by De Hollingshead, RPH-CPP since 02/26/2021 12:00 AM     Problem: Hyperlipidemia, Diabetes      Long-Range Goal: Disease Progression Prevention   Start Date: 11/19/2020  This Visit's Progress: On track  Recent Progress: On track  Priority:  High  Note:   Current Barriers:  Unable to independently afford treatment regimen Unable to achieve control of cholesterol   Pharmacist Clinical Goal(s):  Over the next 90 days, patient will achieve improvement in cholesterol through collaboration with PharmD and provider.   Interventions: 1:1 collaboration with [name], MD regarding development and update of comprehensive plan of care as evidenced by provider attestation and co-signature Inter-disciplinary care team collaboration (see longitudinal plan of care) Comprehensive medication review performed; medication list updated in electronic medical record  SDOH: Medicare A/B. No prescription drug insurance. Denies medication cost concerns  Health Maintenance: Up to date with eye exam. Due for foot exam. Placed reminder in appointment notes.   COVID vaccination x 3  Diabetes: Controlled per last lab work; current treatment: metformin XR 500 mg twice daily Previously recommended to continue current regimen at this time  Hypertension: Controlled per last clinic reading; current treatment: benazepril 40 mg daily, HCTZ 12.5 mg daily. Previously recommended to continue current regimen at this time  Hyperlipidemia and ASCVD risk reduction: None; current treatment: OTC niacin 500 mg daily, OTC omega 3 fatty acids 1000 mg daily, Repatha 140 mcg Q14 days- holding since our last call Reports improvement in muscle aches, still some lingering leg ache and hip pain. Reports feeling much better since being off Repatha Medications previously tried: simvastatin, atorvastatin, rosuvastatin, pravastatin, ezetimibe; muscle pains, Repatha - muscle pains Antiplatelet regimen: aspirin 81 mg daily Discontinue Repatha. Contacted [employer] to make sure there is no auto refill process.  Continue OTC options at this time. Bempedoic acid could clinically be an option, but benefit given age, risk factor, clinical benefit is uncertain. Focus on lifestyle  modifications at this time.   Supplements: Calcium + vitamin D, Vitamin D, Vitamin B12 IM monthly, melatonin QPM PRN  Patient Goals/Self-Care Activities Over the next 90 days, patient will:  - take medications as prescribed check glucose daily, document, and provide at future appointments collaborate with provider on medication access solutions  Follow Up Plan: Telephone follow up appointment with care management team member scheduled for: 4 months. Offered to close CCM case, patient declined, preferred to continue Chronic Care Management appointments.       Medication Assistance: None required.  Patient affirms current coverage meets needs.  Patient's preferred pharmacy is:  [facility] [id number] -  [place], [place] - [address] [place] [address] Phone: [phone] Fax: [phone]  [id number] [place] [address]Phone: [phone]: [phone] [place] TNoconamail services 1[place] LakeMail Order pharmacy [place]esa [place] [address]Phone: [phone]: [phone] Follow Up:  Patient agrees to Care Plan and Follow-up.  Plan: Telephone follow up appointment with care management team member scheduled for:  ~ 4 months  Catie TDarnelle Maffucci PharmD, BS[place] CPalm BeachClinical Pharmacist LOccidental Petroleumat BJohnson [employer] [phone]

## 2021-03-12 ENCOUNTER — Other Ambulatory Visit: Payer: Self-pay | Admitting: Internal Medicine

## 2021-03-12 DIAGNOSIS — Z1231 Encounter for screening mammogram for malignant neoplasm of breast: Secondary | ICD-10-CM

## 2021-03-19 DIAGNOSIS — H16223 Keratoconjunctivitis sicca, not specified as Sjogren's, bilateral: Secondary | ICD-10-CM | POA: Diagnosis not present

## 2021-03-28 ENCOUNTER — Other Ambulatory Visit: Payer: Self-pay | Admitting: Internal Medicine

## 2021-04-10 ENCOUNTER — Other Ambulatory Visit: Payer: Self-pay | Admitting: Internal Medicine

## 2021-04-30 ENCOUNTER — Ambulatory Visit (INDEPENDENT_AMBULATORY_CARE_PROVIDER_SITE_OTHER): Payer: Medicare Other

## 2021-04-30 ENCOUNTER — Ambulatory Visit (INDEPENDENT_AMBULATORY_CARE_PROVIDER_SITE_OTHER): Payer: Medicare Other | Admitting: Internal Medicine

## 2021-04-30 ENCOUNTER — Other Ambulatory Visit: Payer: Self-pay

## 2021-04-30 ENCOUNTER — Other Ambulatory Visit: Payer: Medicare Other

## 2021-04-30 ENCOUNTER — Other Ambulatory Visit (INDEPENDENT_AMBULATORY_CARE_PROVIDER_SITE_OTHER): Payer: Medicare Other

## 2021-04-30 VITALS — BP 112/78 | HR 90 | Temp 98.1°F | Resp 16 | Ht 64.0 in | Wt 203.4 lb

## 2021-04-30 DIAGNOSIS — M1612 Unilateral primary osteoarthritis, left hip: Secondary | ICD-10-CM | POA: Diagnosis not present

## 2021-04-30 DIAGNOSIS — R200 Anesthesia of skin: Secondary | ICD-10-CM

## 2021-04-30 DIAGNOSIS — K219 Gastro-esophageal reflux disease without esophagitis: Secondary | ICD-10-CM

## 2021-04-30 DIAGNOSIS — E7800 Pure hypercholesterolemia, unspecified: Secondary | ICD-10-CM

## 2021-04-30 DIAGNOSIS — M25552 Pain in left hip: Secondary | ICD-10-CM | POA: Diagnosis not present

## 2021-04-30 DIAGNOSIS — I10 Essential (primary) hypertension: Secondary | ICD-10-CM

## 2021-04-30 DIAGNOSIS — Z23 Encounter for immunization: Secondary | ICD-10-CM

## 2021-04-30 DIAGNOSIS — Z86010 Personal history of colonic polyps: Secondary | ICD-10-CM | POA: Diagnosis not present

## 2021-04-30 DIAGNOSIS — I700 Atherosclerosis of aorta: Secondary | ICD-10-CM

## 2021-04-30 DIAGNOSIS — E1165 Type 2 diabetes mellitus with hyperglycemia: Secondary | ICD-10-CM

## 2021-04-30 DIAGNOSIS — M545 Low back pain, unspecified: Secondary | ICD-10-CM | POA: Diagnosis not present

## 2021-04-30 DIAGNOSIS — M5442 Lumbago with sciatica, left side: Secondary | ICD-10-CM

## 2021-04-30 LAB — MICROALBUMIN / CREATININE URINE RATIO
Creatinine,U: 115.7 mg/dL
Microalb Creat Ratio: 0.6 mg/g (ref 0.0–30.0)
Microalb, Ur: <0.7 mg/dL (ref 0.0–1.9)

## 2021-04-30 LAB — TSH: TSH: 3.54 u[IU]/mL (ref 0.35–5.50)

## 2021-04-30 LAB — HM DIABETES FOOT EXAM

## 2021-04-30 LAB — BASIC METABOLIC PANEL
BUN: 18 mg/dL (ref 6–23)
CO2: 31 mEq/L (ref 19–32)
Calcium: 9.2 mg/dL (ref 8.4–10.5)
Chloride: 100 mEq/L (ref 96–112)
Creatinine, Ser: 0.81 mg/dL (ref 0.40–1.20)
GFR: 68.28 mL/min (ref >60.00)
Glucose, Bld: 123 mg/dL — ABNORMAL HIGH (ref 70–99)
Potassium: 4.6 mEq/L (ref 3.5–5.1)
Sodium: 140 mEq/L (ref 135–145)

## 2021-04-30 LAB — VITAMIN B12: Vitamin B-12: 404 pg/mL (ref 211–911)

## 2021-04-30 LAB — HEPATIC FUNCTION PANEL
ALT: 10 U/L (ref 0–35)
AST: 12 U/L (ref 0–37)
Albumin: 4 g/dL (ref 3.5–5.2)
Alkaline Phosphatase: 56 U/L (ref 39–117)
Bilirubin, Direct: 0.1 mg/dL (ref 0.0–0.3)
Total Bilirubin: 0.4 mg/dL (ref 0.2–1.2)
Total Protein: 6 g/dL (ref 6.0–8.3)

## 2021-04-30 LAB — LIPID PANEL
Cholesterol: 228 mg/dL — ABNORMAL HIGH (ref 0–200)
HDL: 54 mg/dL (ref >39.00)
NonHDL: 173.97
Total CHOL/HDL Ratio: 4
Triglycerides: 213 mg/dL — ABNORMAL HIGH (ref 0.0–149.0)
VLDL: 42.6 mg/dL — ABNORMAL HIGH (ref 0.0–40.0)

## 2021-04-30 LAB — HEMOGLOBIN A1C: Hgb A1c MFr Bld: 7.1 % — ABNORMAL HIGH (ref 4.6–6.5)

## 2021-04-30 LAB — LDL CHOLESTEROL, DIRECT: Direct LDL: 141 mg/dL

## 2021-04-30 MED ORDER — "SYRINGE 25G X 1"" 3 ML MISC": 1 refills | Status: AC

## 2021-04-30 NOTE — Progress Notes (Signed)
Patient ID: Misty Jones, female   DOB: [birth year], 80 y.o.   MRN: [id number]   Subjective:    Patient ID: Misty Jones, female    DOB: [birth year], 80 y.o.   MRN: [id number]  This visit occurred during the SARS-CoV-2 public health emergency.  Safety protocols were in place, including screening questions prior to the visit, additional usage of staff PPE, and extensive cleaning of exam room while observing appropriate contact time as indicated for disinfecting solutions.   Patient here for a scheduled follow up.   Chief Complaint  Patient presents with   Hypertension   Diabetes   .   HPI Was on repatha.  Did not tolerate.  Off now.  Feels better off.  Is having left hip pain/low back pain.  Cane helps.  Stairs aggravate.  When gets out of bed - worse.  Raising leg - aggravates. Pain with palpation.  No chest pain.  Breathing stable.  No increased cough or congestion.  Some sinus congestion over the last month.  Discussed saline nasal spray and steroid nasal spray.  No abdominal pain.  Bowels moving.     Past Medical History:  Diagnosis Date   B12 deficiency    Chicken pox    Diabetes mellitus (HCC)    GERD (gastroesophageal reflux disease)    History of diverticulitis of colon    History of dysplastic nevus 07/05/2018   left mid back shv - with moderate atypia   Hypercholesterolemia    Hypertension    Nasal drainage    Past Surgical History:  Procedure Laterality Date   ABDOMINAL HYSTERECTOMY  1984   BACK SURGERY  1986   ruptured disc, Dr Hardin [facility])   [place]   COLONOSCOPY WITH PROPOFOL N/A 06/29/2016   Procedure: COLONOSCOPY WITH PROPOFOL;  Surgeon: Manya Silvas, MD;  Location: [place];  Service: Endoscopy;  Laterality: N/A;   ESOPHAGOGASTRODUODENOSCOPY (EGD) WITH PROPOFOL N/A 12/13/2017   Procedure: ESOPHAGOGASTRODUODENOSCOPY (EGD) WITH PROPOFOL;  Surgeon: Manya Silvas, MD;  Location: [facility] ENDOSCOPY;  Service:  Endoscopy;  Laterality: N/A;   TONSILLECTOMY     Family History  Problem Relation Age of Onset   Hypertension Mother    Cancer Mother        Mouth cancer   Heart disease Father        myocardial infarction   Raynaud syndrome Sister    Asthma Sister    Congenital heart disease Sister    Thyroid disease Sister    Thyroid cancer Daughter    Breast cancer Neg Hx    Colon cancer Neg Hx    Social History   Socioeconomic History   Marital status: Divorced    Spouse name: Not on file   Number of children: 2   Years of education: Not on file   Highest education level: Not on file  Occupational History   Occupation: retired  Tobacco Use   Smoking status: Former    Packs/day: 0.50    Years: 20.00    Pack years: 10.00    Types: Cigarettes    Quit date: 07/26/1984    Years since quitting: 36.7   Smokeless tobacco: Never   Tobacco comments:    quit smoking years ago  Vaping Use   Vaping Use: Never used  Substance and Sexual Activity   Alcohol use: No    Alcohol/week: 0.0 standard drinks   Drug use: No   Sexual activity: Never  Other Topics Concern   Not on file  Social History Narrative   Not on file   Social Determinants of Health   Financial Resource Strain: Low Risk    Difficulty of Paying Living Expenses: Not hard at all  Food Insecurity: No Food Insecurity   Worried About Charity fundraiser in the Last Year: Never true   [place] in the Last Year: Never true  Transportation Needs: No Transportation Needs   Lack of Transportation (Medical): No   Lack of Transportation (Non-Medical): No  Physical Activity: Not on file  Stress: No Stress Concern Present   Feeling of Stress : Not at all  Social Connections: Unknown   Frequency of Communication with Friends and Family: More than three times a week   Frequency of Social Gatherings with Friends and Family: Not on file   Attends Religious Services: Not on file   Active Member of Clubs or Organizations: Not  on file   Attends Archivist Meetings: Not on file   Marital Status: Not on file     Review of Systems  Constitutional:  Negative for appetite change and unexpected weight change.  HENT:  Positive for congestion. Negative for sinus pressure.   Respiratory:  Negative for cough, chest tightness and shortness of breath.   Cardiovascular:  Negative for chest pain and palpitations.  Gastrointestinal:  Negative for abdominal pain, diarrhea, nausea and vomiting.  Genitourinary:  Negative for difficulty urinating and dysuria.  Musculoskeletal:  Positive for back pain. Negative for joint swelling and myalgias.       Left hip pain.    Skin:  Negative for color change and rash.  Neurological:  Negative for dizziness, light-headedness and headaches.  Psychiatric/Behavioral:  Negative for agitation and dysphoric mood.       Objective:     BP 112/78   Pulse 90   Temp 98.1 F (36.7 C)   Resp 16   Ht 5' 4"  (1.626 m)   Wt 203 lb 6.4 oz (92.3 kg)   SpO2 99%   BMI 34.91 kg/m  Wt Readings from Last 3 Encounters:  04/30/21 203 lb 6.4 oz (92.3 kg)  01/21/21 202 lb (91.6 kg)  01/17/21 202 lb 9.6 oz (91.9 kg)    Physical Exam Vitals reviewed.  Constitutional:      General: She is not in acute distress.    Appearance: Normal appearance.  HENT:     Head: Normocephalic and atraumatic.     Right Ear: External ear normal.     Left Ear: External ear normal.  Eyes:     General: No scleral icterus.       Right eye: No discharge.        Left eye: No discharge.     Conjunctiva/sclera: Conjunctivae normal.  Neck:     Thyroid: No thyromegaly.  Cardiovascular:     Rate and Rhythm: Normal rate and regular rhythm.  Pulmonary:     Effort: No respiratory distress.     Breath sounds: Normal breath sounds. No wheezing.  Abdominal:     General: Bowel sounds are normal.     Palpations: Abdomen is soft.     Tenderness: There is no abdominal tenderness.  Musculoskeletal:        General:  No swelling or tenderness.     Cervical back: Neck supple. No tenderness.  Lymphadenopathy:     Cervical: No cervical adenopathy.  Skin:    Findings: No erythema or rash.  Neurological:     Mental Status: She is alert.  Psychiatric:        Mood and Affect: Mood normal.        Behavior: Behavior normal.     Outpatient Encounter Medications as of 04/30/2021  Medication Sig   Syringe/Needle, Disp, (SYRINGE 3CC/25GX1") 25G X 1" 3 ML MISC Use as directed to administer b12 injections.   aspirin 81 MG tablet Take 81 mg by mouth daily.   benazepril (LOTENSIN) 40 MG tablet Take 1 tablet by mouth once daily   Calcium Carbonate-Vitamin D 600-400 MG-UNIT tablet Take 1 tablet by mouth 2 (two) times daily.   Cholecalciferol (VITAMIN D) 50 MCG (2000 UT) CAPS Take 1 capsule (2,000 Units total) by mouth daily.   cyanocobalamin (,VITAMIN B-12,) 1000 MCG/ML injection INJECT 1 ML INTRAMUSCULARLY ONCE EVERY MONTH   glucose blood (CONTOUR TEST) test strip USE 1 STRIP TO CHECK GLUCOSE ONCE DAILY. Dx E11.9   hydrochlorothiazide (HYDRODIURIL) 25 MG tablet Take 1/2 (one-half) tablet by mouth once daily   Lancets MISC Check sugars once a day Dx: E11.9 (Contour Next)   metFORMIN (GLUCOPHAGE-XR) 500 MG 24 hr tablet Take 1 tablet by mouth twice daily   mupirocin ointment (BACTROBAN) 2 % Apply to affected area bid   Omega-3 1000 MG CAPS Take 1,000 mg by mouth daily.   omeprazole (PRILOSEC) 20 MG capsule Take 20 mg by mouth daily.   No facility-administered encounter medications on file as of 04/30/2021.     Lab Results  Component Value Date   WBC 8.6 05/30/2020   HGB 13.2 05/30/2020   HCT 39.4 05/30/2020   PLT 334.0 05/30/2020   GLUCOSE 123 (H) 04/30/2021   CHOL 228 (H) 04/30/2021   TRIG 213.0 (H) 04/30/2021   HDL 54.00 04/30/2021   LDLDIRECT 141.0 04/30/2021   LDLCALC 48 01/15/2021   ALT 10 04/30/2021   AST 12 04/30/2021   NA 140 04/30/2021   K 4.6 04/30/2021   CL 100 04/30/2021   CREATININE 0.81  04/30/2021   BUN 18 04/30/2021   CO2 31 04/30/2021   TSH 3.54 04/30/2021   INR 0.9 04/03/2012   HGBA1C 7.1 (H) 04/30/2021   MICROALBUR <0.7 04/30/2021    MM 3D SCREEN BREAST BILATERAL  Result Date: 05/20/2020 CLINICAL DATA:  Screening. EXAM: DIGITAL SCREENING BILATERAL MAMMOGRAM WITH TOMO AND CAD COMPARISON:  Previous exam(s). ACR Breast Density Category b: There are scattered areas of fibroglandular density. FINDINGS: There are no findings suspicious for malignancy. Images were processed with CAD. IMPRESSION: No mammographic evidence of malignancy. A result letter of this screening mammogram will be mailed directly to the patient. RECOMMENDATION: Screening mammogram in one year. (Code:SM-B-01Y) BI-RADS CATEGORY  1: Negative. Electronically Signed   By: Lajean Manes M.D.   On: 05/20/2020 14:02       Assessment & Plan:   Problem List Items Addressed This Visit     Aortic atherosclerosis ([place])    Intolerant to statins and zetia.  Off repatha.  Low cholesterol diet and exercise.  Follow lipid panel and liver function tests.   Lab Results  Component Value Date   CHOL 228 (H) 04/30/2021   HDL 54.00 04/30/2021   LDLCALC 48 01/15/2021   LDLDIRECT 141.0 04/30/2021   TRIG 213.0 (H) 04/30/2021   CHOLHDL 4 04/30/2021        Diabetes mellitus ([place])    On metformin.  Continue low carb diet and exercise. Follow met b and a1c.   Lab Results  Component  Value Date   HGBA1C 7.1 (H) 04/30/2021       GERD (gastroesophageal reflux disease)    No upper symptoms reported.  On prilosec.        History of colonic polyps    Colonoscopy 12.2017 - one polyp in ascending colon and two rectal polyps (tubular adenomas).  recommended f/u in 5 years.        Hypercholesterolemia    Intolerant to statins, zetia and repatha.  On no medication now.  Low cholesterol diet and exercise.  Follow lipid panel.       Hypertension    Blood pressure is doing well.  Continue benazepril and hctz.  Follow  pressures.  Follow metabolic panel.       Left hip pain    Discussed possibility of bursitis.  Stretches.  Check xray.  Further w/up pending results.       Relevant Orders   DG Hip Unilat W OR W/O Pelvis 2-3 Views Left (Completed)   Low back pain - Primary    Persistent pain.  Check xray.  Further w/up and evaluation pending results.       Relevant Orders   DG Lumbar Spine 2-3 Views (Completed)   Other Visit Diagnoses     Need for immunization against influenza       Relevant Orders   Flu Vaccine QUAD High Dose(Fluad) (Completed)        [name], MD

## 2021-05-02 DIAGNOSIS — Z1152 Encounter for screening for COVID-19: Secondary | ICD-10-CM | POA: Diagnosis not present

## 2021-05-04 ENCOUNTER — Encounter: Payer: Self-pay | Admitting: Internal Medicine

## 2021-05-04 NOTE — Assessment & Plan Note (Signed)
Persistent pain.  Check xray.  Further w/up and evaluation pending results.

## 2021-05-04 NOTE — Assessment & Plan Note (Signed)
Intolerant to statins and zetia.  Off repatha.  Low cholesterol diet and exercise.  Follow lipid panel and liver function tests.   Lab Results  Component Value Date   CHOL 228 (H) 04/30/2021   HDL 54.00 04/30/2021   LDLCALC 48 01/15/2021   LDLDIRECT 141.0 04/30/2021   TRIG 213.0 (H) 04/30/2021   CHOLHDL 4 04/30/2021

## 2021-05-04 NOTE — Assessment & Plan Note (Signed)
Discussed possibility of bursitis.  Stretches.  Check xray.  Further w/up pending results.

## 2021-05-04 NOTE — Assessment & Plan Note (Signed)
Colonoscopy 12.2017 - one polyp in ascending colon and two rectal polyps (tubular adenomas).  recommended f/u in 5 years.

## 2021-05-04 NOTE — Assessment & Plan Note (Signed)
No upper symptoms reported. On prilosec.  

## 2021-05-04 NOTE — Assessment & Plan Note (Signed)
Blood pressure is doing well.  Continue benazepril and hctz.  Follow pressures.  Follow metabolic panel.

## 2021-05-04 NOTE — Assessment & Plan Note (Signed)
Intolerant to statins, zetia and repatha.  On no medication now.  Low cholesterol diet and exercise.  Follow lipid panel.

## 2021-05-04 NOTE — Assessment & Plan Note (Signed)
On metformin.  Continue low carb diet and exercise. Follow met b and a1c.   Lab Results  Component Value Date   HGBA1C 7.1 (H) 04/30/2021

## 2021-05-05 ENCOUNTER — Ambulatory Visit: Payer: Medicare Other | Admitting: Internal Medicine

## 2021-05-09 DIAGNOSIS — H04123 Dry eye syndrome of bilateral lacrimal glands: Secondary | ICD-10-CM | POA: Diagnosis not present

## 2021-05-09 DIAGNOSIS — H353211 Exudative age-related macular degeneration, right eye, with active choroidal neovascularization: Secondary | ICD-10-CM | POA: Diagnosis not present

## 2021-05-09 DIAGNOSIS — H353124 Nonexudative age-related macular degeneration, left eye, advanced atrophic with subfoveal involvement: Secondary | ICD-10-CM | POA: Diagnosis not present

## 2021-05-16 ENCOUNTER — Encounter: Payer: Self-pay | Admitting: Internal Medicine

## 2021-05-16 NOTE — Telephone Encounter (Signed)
Spoken to patient, she has been having sinus congestion, sore throat, and cough for a week. She has also been experiencing chest tightness due to the coughing.She has been taking Robitussin DM and cough drops to help with sx. She believes now she needs an antibiotic to help take care of her sx. She feels she may be having the start of a URI. She has not have any sx of nausea, vomiting, diarrhea, fever, chills, SOB, back pain, chest pain, and joint px.  Instructed patient she needed to be evaluated due to no availability. She was instructed on how to have a virtual appointment on mychart. After setting up appointment call was disconnected.

## 2021-05-17 ENCOUNTER — Encounter: Payer: Self-pay | Admitting: Internal Medicine

## 2021-05-17 NOTE — Telephone Encounter (Signed)
I called Ms Zenz to see how she was doing and to see if evaluated.  Unable to reach her.  Left her a message.  Does need evaluation with her symptoms.  Please call and get update on how she is doing.  I can work her in Tuesday (virtual visit) (at 2:00) if needs to be seen.

## 2021-05-19 ENCOUNTER — Other Ambulatory Visit: Payer: Self-pay

## 2021-05-19 ENCOUNTER — Ambulatory Visit
Admission: RE | Admit: 2021-05-19 | Discharge: 2021-05-19 | Disposition: A | Discharge status: Home / Self Care | Payer: Medicare Other | Source: Ambulatory Visit | Attending: Internal Medicine | Admitting: Internal Medicine

## 2021-05-19 DIAGNOSIS — Z1231 Encounter for screening mammogram for malignant neoplasm of breast: Secondary | ICD-10-CM | POA: Insufficient documentation

## 2021-05-19 NOTE — Telephone Encounter (Signed)
Spoke with patient to confirm that she is feeling better. Sore throat and chest congestion better. Patient felt like she does not need visit at this time. She will follow up if needed.

## 2021-05-22 ENCOUNTER — Ambulatory Visit (INDEPENDENT_AMBULATORY_CARE_PROVIDER_SITE_OTHER): Payer: Medicare Other | Admitting: Dermatology

## 2021-05-22 ENCOUNTER — Other Ambulatory Visit: Payer: Self-pay

## 2021-05-22 DIAGNOSIS — L821 Other seborrheic keratosis: Secondary | ICD-10-CM | POA: Diagnosis not present

## 2021-05-22 DIAGNOSIS — D229 Melanocytic nevi, unspecified: Secondary | ICD-10-CM

## 2021-05-22 DIAGNOSIS — L304 Erythema intertrigo: Secondary | ICD-10-CM | POA: Diagnosis not present

## 2021-05-22 DIAGNOSIS — L570 Actinic keratosis: Secondary | ICD-10-CM | POA: Diagnosis not present

## 2021-05-22 DIAGNOSIS — Z1283 Encounter for screening for malignant neoplasm of skin: Secondary | ICD-10-CM | POA: Diagnosis not present

## 2021-05-22 DIAGNOSIS — D1801 Hemangioma of skin and subcutaneous tissue: Secondary | ICD-10-CM | POA: Diagnosis not present

## 2021-05-22 DIAGNOSIS — L578 Other skin changes due to chronic exposure to nonionizing radiation: Secondary | ICD-10-CM

## 2021-05-22 DIAGNOSIS — K130 Diseases of lips: Secondary | ICD-10-CM

## 2021-05-22 DIAGNOSIS — L814 Other melanin hyperpigmentation: Secondary | ICD-10-CM

## 2021-05-22 DIAGNOSIS — L820 Inflamed seborrheic keratosis: Secondary | ICD-10-CM | POA: Diagnosis not present

## 2021-05-22 DIAGNOSIS — Z86018 Personal history of other benign neoplasm: Secondary | ICD-10-CM | POA: Diagnosis not present

## 2021-05-22 MED ORDER — HYDROCORTISONE 2.5 % EX CREA: TOPICAL_CREAM | CUTANEOUS | 0 refills | Status: DC

## 2021-05-22 MED ORDER — KETOCONAZOLE 2 % EX CREA: TOPICAL_CREAM | CUTANEOUS | 0 refills | Status: DC

## 2021-05-22 NOTE — Progress Notes (Signed)
Follow-Up Visit   Subjective  Misty Jones is a 80 y.o. female who presents for the following: Annual Exam (Hx of dysplastic nevus - patient c/o constant irritation and chapping of lips. She is currently using Vaseline but continues to have symptoms.). Pt c/o irritated skin lesions at the neck and forehead. The patient presents for Total-Body Skin Exam (TBSE) for skin cancer screening and mole check.  The following portions of the chart were reviewed this encounter and updated as appropriate:   Tobacco  Allergies  Meds  Problems  Med Hx  Surg Hx  Fam Hx      Review of Systems:  No other skin or systemic complaints except as noted in HPI or Assessment and Plan.  Objective  Well appearing patient in no apparent distress; mood and affect are within normal limits.  A full examination was performed including scalp, head, eyes, ears, nose, lips, neck, chest, axillae, abdomen, back, buttocks, bilateral upper extremities, bilateral lower extremities, hands, feet, fingers, toes, fingernails, and toenails. All findings within normal limits unless otherwise noted below.  Lips Xerosis with slight scale  lower abdomen Erythema.   Forehead x 1, R neck x 20 (21) Erythematous keratotic or waxy stuck-on papule or plaque.   Right Upper Back x 1 Erythematous thin papules/macules with gritty scale.    Assessment & Plan  Cheilitis Lips  Start HC 2.5% cream BID up to two weeks and Ketoconazole 2% cream BID.   Topical steroids (such as triamcinolone, fluocinolone, fluocinonide, mometasone, clobetasol, halobetasol, betamethasone, hydrocortisone) can cause thinning and lightening of the skin if they are used for too long in the same area. Your physician has selected the right strength medicine for your problem and area affected on the body. Please use your medication only as directed by your physician to prevent side effects.   Recheck at f/u appt.  hydrocortisone 2.5 % cream - Lips Apply  to aa's lips and lower abdomen BID up to two weeks.  ketoconazole (NIZORAL) 2 % cream - Lips Apply to aa's lips and lower abdomen BID PRN.  Erythema intertrigo lower abdomen  Chronic condition with duration or expected duration over one year. Condition is bothersome to patient. Currently flared.  Intertrigo is a chronic recurrent rash that occurs in skin fold areas that may be associated with friction; heat; moisture; yeast; fungus; and bacteria.  It is exacerbated by increased movement / activity; sweating; and higher atmospheric temperature.  Start Ketoconazole 2% cream twice a day as needed and HC 2.5% cream twice a day as needed up to 2 weeks, apply to abdomen skin fold area for flares  Topical steroids (such as triamcinolone, fluocinolone, fluocinonide, mometasone, clobetasol, halobetasol, betamethasone, hydrocortisone) can cause thinning and lightening of the skin if they are used for too long in the same area. Your physician has selected the right strength medicine for your problem and area affected on the body. Please use your medication only as directed by your physician to prevent side effects.    Inflamed seborrheic keratosis Forehead x 1, R neck x 20  Symptomatic  Prior to procedure, discussed risks of blister formation, small wound, skin dyspigmentation, or rare scar following cryotherapy. Recommend Vaseline ointment to treated areas while healing.   Destruction of lesion - Forehead x 1, R neck x 20 Complexity: simple   Destruction method: cryotherapy   Informed consent: discussed and consent obtained   Timeout:  patient name, date of birth, surgical site, and procedure verified Lesion destroyed using liquid  nitrogen: Yes   Region frozen until ice ball extended beyond lesion: Yes   Outcome: patient tolerated procedure well with no complications   Post-procedure details: wound care instructions given    AK (actinic keratosis) Right Upper Back x 1  Hypertrophic -  recheck at f/u appt in 2-3 mths  Destruction of lesion - Right Upper Back x 1 Complexity: simple   Destruction method: cryotherapy   Informed consent: discussed and consent obtained   Timeout:  patient name, date of birth, surgical site, and procedure verified Lesion destroyed using liquid nitrogen: Yes   Region frozen until ice ball extended beyond lesion: Yes   Outcome: patient tolerated procedure well with no complications   Post-procedure details: wound care instructions given    Lentigines - Scattered tan macules - Due to sun exposure - Benign-appearing, observe - Recommend daily broad spectrum sunscreen SPF 30+ to sun-exposed areas, reapply every 2 hours as needed. - Call for any changes  Seborrheic Keratoses - Stuck-on, waxy, tan-brown papules and/or plaques  - Benign-appearing - Discussed benign etiology and prognosis. - Observe - Call for any changes  Melanocytic Nevi - Tan-brown and/or pink-flesh-colored symmetric macules and papules - Benign appearing on exam today - Observation - Call clinic for new or changing moles - Recommend daily use of broad spectrum spf 30+ sunscreen to sun-exposed areas.   Hemangiomas - Red papules - Discussed benign nature - Observe - Call for any changes  Actinic Damage - Chronic condition, secondary to cumulative UV/sun exposure - diffuse scaly erythematous macules with underlying dyspigmentation - Recommend daily broad spectrum sunscreen SPF 30+ to sun-exposed areas, reapply every 2 hours as needed.  - Staying in the shade or wearing long sleeves, sun glasses (UVA+UVB protection) and wide brim hats (4-inch brim around the entire circumference of the hat) are also recommended for sun protection.  - Call for new or changing lesions.  Skin cancer screening performed today.  Return for AK and chelitis f/u in 2-3 mths.  Luther Redo, CMA, am acting as scribe for Forest Gleason, MD .  Documentation: I have reviewed the above  documentation for accuracy and completeness, and I agree with the above.  Forest Gleason, MD

## 2021-05-22 NOTE — Patient Instructions (Addendum)
Recommend taking Heliocare sun protection supplement daily in sunny weather for additional sun protection. For maximum protection on the sunniest days, you can take up to 2 capsules of regular Heliocare OR take 1 capsule of Heliocare Ultra. For prolonged exposure (such as a full day in the sun), you can repeat your dose of the supplement 4 hours after your first dose. Heliocare can be purchased at [facility] or at [email or web address].   Topical steroids (such as triamcinolone, fluocinolone, fluocinonide, mometasone, clobetasol, halobetasol, betamethasone, hydrocortisone) can cause thinning and lightening of the skin if they are used for too long in the same area. Your physician has selected the right strength medicine for your problem and area affected on the body. Please use your medication only as directed by your physician to prevent side effects.   If you have any questions or concerns for your doctor, please call our main line at [phone] and press option 4 to reach your doctor's medical assistant. If no one answers, please leave a voicemail as directed and we will return your call as soon as possible. Messages left after 4 pm will be answered the following business day.   You may also send [place] a message via [place]. We typically respond to MyChart messages within 1-2 business days.  For prescription refills, please ask your pharmacy to contact our office. Our fax number is [phone].  If you have an urgent issue when the clinic is closed that cannot wait until the next business day, you can page your doctor at the number below.    Please note that while we do our best to be available for urgent issues outside of office hours, we are not available 24/7.   If you have an urgent issue and are unable to reach [place], you may choose to seek medical care at your doctor's office, retail clinic, urgent care center, or emergency room.  If you have a medical emergency, please immediately call  911 or go to the emergency department.  Pager Numbers  - Dr. Nehemiah Massed: [phone]  - Dr. Laurence Ferrari: [phone]  - Dr. Nicole Kindred: [phone]  In the event of inclement weather, please call our main line at [phone] for an update on the status of any delays or closures.  Dermatology Medication Tips: Please keep the boxes that topical medications come in in order to help keep track of the instructions about where and how to use these. Pharmacies typically print the medication instructions only on the boxes and not directly on the medication tubes.   If your medication is too expensive, please contact our office at [phone] option 4 or send [place] a message through [place].   We are unable to tell what your co-pay for medications will be in advance as this is different depending on your insurance coverage. However, we may be able to find a substitute medication at lower cost or fill out paperwork to get insurance to cover a needed medication.   If a prior authorization is required to get your medication covered by your insurance company, please allow [place] 1-2 business days to complete this process.  Drug prices often vary depending on where the prescription is filled and some pharmacies may offer cheaper prices.  The website www.goodrx.com contains coupons for medications through different pharmacies. The prices here do not account for what the cost may be with help from insurance (it may be cheaper with your insurance), but the website can give you the price if you did not use any insurance.  -  You can print the associated coupon and take it with your prescription to the pharmacy.  - You may also stop by our office during regular business hours and pick up a GoodRx coupon card.  - If you need your prescription sent electronically to a different pharmacy, notify our office through [facility] or by phone at [phone] option 4.

## 2021-05-26 ENCOUNTER — Encounter: Payer: Self-pay | Admitting: Internal Medicine

## 2021-05-27 MED ORDER — CONTOUR TEST VI STRP: ORAL_STRIP | 5 refills | Status: DC

## 2021-05-30 MED ORDER — CONTOUR TEST VI STRP: ORAL_STRIP | 5 refills | Status: DC

## 2021-05-30 NOTE — Addendum Note (Signed)
Addended by: Elpidio Galea T on: 05/30/2021 03:18 PM   Modules accepted: Orders

## 2021-06-04 ENCOUNTER — Encounter: Payer: Self-pay | Admitting: Dermatology

## 2021-06-17 ENCOUNTER — Telehealth: Payer: Medicare Other

## 2021-06-20 ENCOUNTER — Other Ambulatory Visit: Payer: Self-pay | Admitting: Internal Medicine

## 2021-07-02 ENCOUNTER — Encounter: Payer: Self-pay | Admitting: Internal Medicine

## 2021-07-03 NOTE — Telephone Encounter (Addendum)
Patient started coughing on Tuesday, and now wheezing, no fever , no chills , no body aches , Using Robitussin and albuterol inhaler.  Patient actively having audible wheezing over phone was going to schedule Virtual tomorrow a  4 until I heard wheezing.

## 2021-07-03 NOTE — Telephone Encounter (Signed)
Can she come in this pm for a swab?  (Check flu, covid and RSV).  Is she having any sob, chest pain, etc?

## 2021-07-03 NOTE — Telephone Encounter (Signed)
With current symptoms, needs to be seen to know how best to treat.

## 2021-07-03 NOTE — Telephone Encounter (Signed)
Left message to call office

## 2021-07-04 ENCOUNTER — Telehealth (INDEPENDENT_AMBULATORY_CARE_PROVIDER_SITE_OTHER): Payer: Medicare Other | Admitting: Internal Medicine

## 2021-07-04 ENCOUNTER — Encounter: Payer: Self-pay | Admitting: Internal Medicine

## 2021-07-04 ENCOUNTER — Ambulatory Visit: Payer: Medicare Other

## 2021-07-04 ENCOUNTER — Other Ambulatory Visit: Payer: Self-pay

## 2021-07-04 ENCOUNTER — Telehealth: Payer: Self-pay | Admitting: Internal Medicine

## 2021-07-04 DIAGNOSIS — I10 Essential (primary) hypertension: Secondary | ICD-10-CM | POA: Diagnosis not present

## 2021-07-04 DIAGNOSIS — R059 Cough, unspecified: Secondary | ICD-10-CM

## 2021-07-04 MED ORDER — PREDNISONE 10 MG PO TABS: ORAL_TABLET | ORAL | 0 refills | Status: DC

## 2021-07-04 MED ORDER — ALBUTEROL SULFATE HFA 108 (90 BASE) MCG/ACT IN AERS: 2.0000 | INHALATION_SPRAY | Freq: Four times a day (QID) | RESPIRATORY_TRACT | 0 refills | Status: DC | PRN

## 2021-07-04 NOTE — Telephone Encounter (Signed)
Pt called in stating she have questions about the direction on her medication (predniSONE (DELTASONE) 10 MG tablet). Pt requesting callback

## 2021-07-04 NOTE — Telephone Encounter (Signed)
Patient sen in clinic

## 2021-07-04 NOTE — Progress Notes (Signed)
Patient ID: Misty Jones, female   DOB: 1941/04/27, 80 y.o.   MRN: [id number]   Virtual Visit via telephone Note  This visit type was conducted due to national recommendations for restrictions regarding the COVID-19 pandemic (e.g. social distancing).  This format is felt to be most appropriate for this patient at this time.  All issues noted in this document were discussed and addressed.  No physical exam was performed (except for noted visual exam findings with Video Visits).   I connected with Misty Jones by telephone and verified that I am speaking with the correct person using two identifiers. Location patient: home Location provider: work Persons participating in the telephone visit: patient, provider  The limitations, risks, security and privacy concerns of performing an evaluation and management service by telephone and the availability of in person appointments have been discussed.  It has also been discussed with the patient that there may be a patient responsible charge related to this service. The patient expressed understanding and agreed to proceed.   Reason for visit: work in appt  HPI: Work in appt for cough and wheezing.  States symptoms started on Tuesday 07/01/21.  Reports "strangled on tea" - Tuesday.  Increased cough. The cough has been persistent.  Also reports some headache.  Has taken tylenol and alleve.  Increased sinus pressure and nasal congestion.  No sore throat.  Increased post nasal drainage.  Cough is occasionally productive.  Chest congestion and some tightness related to the cough.  No chest pain or sob.  No body aches.  No fever.  No nausea or vomiting.  Home covid negative.  Started robitussin.  No diarrhea.  No known covid exposure.    ROS: See pertinent positives and negatives per HPI.  Past Medical History:  Diagnosis Date   B12 deficiency    Chicken pox    Diabetes mellitus (HCC)    GERD (gastroesophageal reflux disease)    History of diverticulitis  of colon    History of dysplastic nevus 07/05/2018   left mid back shv - with moderate atypia   Hypercholesterolemia    Hypertension    Nasal drainage     Past Surgical History:  Procedure Laterality Date   ABDOMINAL HYSTERECTOMY  1984   BACK SURGERY  1986   ruptured disc, Dr Hardin Negus [facility])   [place]   COLONOSCOPY WITH PROPOFOL N/A 06/29/2016   Procedure: COLONOSCOPY WITH PROPOFOL;  Surgeon: Manya Silvas, MD;  Location: [place];  Service: Endoscopy;  Laterality: N/A;   ESOPHAGOGASTRODUODENOSCOPY (EGD) WITH PROPOFOL N/A 12/13/2017   Procedure: ESOPHAGOGASTRODUODENOSCOPY (EGD) WITH PROPOFOL;  Surgeon: Manya Silvas, MD;  Location: [facility] ENDOSCOPY;  Service: Endoscopy;  Laterality: N/A;   TONSILLECTOMY      Family History  Problem Relation Age of Onset   Hypertension Mother    Cancer Mother        Mouth cancer   Heart disease Father        myocardial infarction   Raynaud syndrome Sister    Asthma Sister    Congenital heart disease Sister    Thyroid disease Sister    Thyroid cancer Daughter    Breast cancer Neg Hx    Colon cancer Neg Hx     SOCIAL HX: reviewed.    Current Outpatient Medications:    albuterol (VENTOLIN HFA) 108 (90 Base) MCG/ACT inhaler, Inhale 2 puffs into the lungs every 6 (six) hours as needed for wheezing or shortness  of breath., Disp: 18 g, Rfl: 0   aspirin 81 MG tablet, Take 81 mg by mouth daily., Disp: , Rfl:    benazepril (LOTENSIN) 40 MG tablet, Take 1 tablet by mouth once daily, Disp: 90 tablet, Rfl: 0   Calcium Carbonate-Vitamin D 600-400 MG-UNIT tablet, Take 1 tablet by mouth 2 (two) times daily., Disp: , Rfl:    Cholecalciferol (VITAMIN D) 50 MCG (2000 UT) CAPS, Take 1 capsule (2,000 Units total) by mouth daily., Disp: , Rfl:    cyanocobalamin (,VITAMIN B-12,) 1000 MCG/ML injection, INJECT 1 ML INTRAMUSCULARLY ONCE EVERY MONTH, Disp: 30 mL, Rfl: 0   glucose blood (CONTOUR TEST) test  strip, USE 1 STRIP TO CHECK GLUCOSE ONCE DAILY., Disp: 50 each, Rfl: 5   hydrochlorothiazide (HYDRODIURIL) 25 MG tablet, Take 1/2 (one-half) tablet by mouth once daily, Disp: 90 tablet, Rfl: 0   Lancets MISC, Check sugars once a day Dx: E11.9 (Contour Next), Disp: 100 each, Rfl: 3   metFORMIN (GLUCOPHAGE-XR) 500 MG 24 hr tablet, Take 1 tablet by mouth twice daily, Disp: 180 tablet, Rfl: 0   Omega-3 1000 MG CAPS, Take 1,000 mg by mouth daily., Disp: , Rfl:    omeprazole (PRILOSEC) 20 MG capsule, Take 20 mg by mouth daily., Disp: , Rfl:    predniSONE (DELTASONE) 10 MG tablet, Take 4 tablets x 1 day and then decrease by 1/2 tablet per day until down to zero mg., Disp: 18 tablet, Rfl: 0   Syringe/Needle, Disp, (SYRINGE 3CC/25GX1") 25G X 1" 3 ML MISC, Use as directed to administer b12 injections., Disp: 50 each, Rfl: 1  EXAM:  GENERAL: alert. Sounds to be in no acute distress.  Answering questions appropriately.   PSYCH/NEURO: pleasant and cooperative, no obvious depression or anxiety, speech and thought processing grossly intact  ASSESSMENT AND PLAN:  Discussed the following assessment and plan:  Problem List Items Addressed This Visit     Cough - Primary    Cough and congestion as outlined.  Also with increased sinus pressure and nasal congestion.  Symptoms started 07/01/21.  No chest pain or sob.  Will obtain PCR - flu, covid and RSV.  Saline nasal spray and nasacort nasal spray as directed.  Robitussin DM.  Prednisone taper as directed.  Albuterol inhaler if needed.  If covid positive, discussed EUA - oral antiviral medication.  Rest.  Stay hydrated.  Discussed quarantine guidelines.        Relevant Orders   COVID-19, Flu A+B and RSV   Hypertension    Continue benazepril and hctz.  Follow pressures.        Return if symptoms worsen or fail to improve.   I discussed the assessment and treatment plan with the patient. The patient was provided an opportunity to ask questions and all  were answered. The patient agreed with the plan and demonstrated an understanding of the instructions.   The patient was advised to call back or seek an in-person evaluation if the symptoms worsen or if the condition fails to improve as anticipated.  I provided 23 minutes of non-face-to-face time during this encounter.   [name], MD

## 2021-07-04 NOTE — Telephone Encounter (Signed)
Patient given directiions.

## 2021-07-05 ENCOUNTER — Encounter: Payer: Self-pay | Admitting: Internal Medicine

## 2021-07-05 LAB — COVID-19, FLU A+B AND RSV
Influenza A, NAA: DETECTED — AB
Influenza B, NAA: NOT DETECTED
RSV, NAA: NOT DETECTED
SARS-CoV-2, NAA: NOT DETECTED

## 2021-07-05 NOTE — Assessment & Plan Note (Signed)
Continue benazepril and hctz.  Follow pressures.

## 2021-07-05 NOTE — Assessment & Plan Note (Signed)
Cough and congestion as outlined.  Also with increased sinus pressure and nasal congestion.  Symptoms started 07/01/21.  No chest pain or sob.  Will obtain PCR - flu, covid and RSV.  Saline nasal spray and nasacort nasal spray as directed.  Robitussin DM.  Prednisone taper as directed.  Albuterol inhaler if needed.  If covid positive, discussed EUA - oral antiviral medication.  Rest.  Stay hydrated.  Discussed quarantine guidelines.

## 2021-07-07 ENCOUNTER — Encounter: Payer: Self-pay | Admitting: Internal Medicine

## 2021-07-08 ENCOUNTER — Encounter: Payer: Self-pay | Admitting: Internal Medicine

## 2021-07-08 NOTE — Telephone Encounter (Signed)
Update from visit last week. She tested positive for the flu on 12/9

## 2021-07-08 NOTE — Telephone Encounter (Signed)
Needs to be fever free for at least 24 hours without taking medication to help keep temperature down.  Would like for the cough and congestion to be significantly improved.  If she has to go out and is coughing, would recommend wearing a mask - just to protect other people.

## 2021-07-10 ENCOUNTER — Telehealth: Payer: Medicare Other | Admitting: Internal Medicine

## 2021-07-11 ENCOUNTER — Other Ambulatory Visit: Payer: Self-pay | Admitting: Internal Medicine

## 2021-08-21 ENCOUNTER — Other Ambulatory Visit: Payer: Self-pay

## 2021-08-21 ENCOUNTER — Ambulatory Visit (INDEPENDENT_AMBULATORY_CARE_PROVIDER_SITE_OTHER): Payer: Medicare Other | Admitting: Dermatology

## 2021-08-21 ENCOUNTER — Encounter: Payer: Self-pay | Admitting: Dermatology

## 2021-08-21 DIAGNOSIS — L821 Other seborrheic keratosis: Secondary | ICD-10-CM

## 2021-08-21 DIAGNOSIS — Z872 Personal history of diseases of the skin and subcutaneous tissue: Secondary | ICD-10-CM

## 2021-08-21 DIAGNOSIS — K130 Diseases of lips: Secondary | ICD-10-CM | POA: Diagnosis not present

## 2021-08-21 DIAGNOSIS — R202 Paresthesia of skin: Secondary | ICD-10-CM

## 2021-08-21 NOTE — Progress Notes (Signed)
° °  Follow-Up Visit   Subjective  Misty Jones is a 81 y.o. female who presents for the following: AK (Recheck upper back. Tx with LN2 05/22/2021.), Rash (Upper lip. Has used ketoconazole cream but not hydrocortisone.  Has not resolved. Pink, scaly. Upper and lower lip), and ISKs (Lesions on neck have not resolved. Tx with LN2 04/2021. Brown, waxy).  The following portions of the chart were reviewed this encounter and updated as appropriate:  Tobacco   Allergies   Meds   Problems   Med Hx   Surg Hx   Fam Hx       Review of Systems: No other skin or systemic complaints except as noted in HPI or Assessment and Plan.   Objective  Well appearing patient in no apparent distress; mood and affect are within normal limits.  A focused examination was performed including face, neck, back, lips. Relevant physical exam findings are noted in the Assessment and Plan.  Lips Erythema with scale  Right Upper Back Normal appearing skin today   Assessment & Plan   Seborrheic Keratoses - Stuck-on, waxy, tan-brown papules and/or plaques  - Benign-appearing - Discussed benign etiology and prognosis. 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 Chronic rubbing or scratching causes darkening of the skin.  - OTC treatments which can help with itch include numbing creams like pramoxine or lidocaine which temporarily reduce itch or Capsaicin-containing creams which cause a burning sensation but which sometimes over time will reset the nerves to stop producing itch.  - If you choose to use Capsaicin cream, it is recommended to use it 5 times daily for 1 week followed by 3 times daily for 3-6 weeks. You may have to continue using it long-term.  - For severe cases, there are some prescription cream or pill options which may  help.  Cheilitis Lips  Stop Beeswax lip balm. Recommend only vaseline or aquaphor lip balm until this is resolved.  Use Hydrocortisone 2.5% cream twice daily up to two weeks to lips. Call if lips not better after this.  No signs of skin cancer on clinical exam today.  History of actinic keratosis Right Upper Back  Resolved. Monitor for recurrence.   Return for TBSE As Scheduled.  I, Emelia Salisbury, CMA, am acting as scribe for Forest Gleason, MD.  Documentation: I have reviewed the above documentation for accuracy and completeness, and I agree with the above.  Forest Gleason, MD

## 2021-08-21 NOTE — Patient Instructions (Addendum)
Cryotherapy Aftercare  Wash gently with soap and water everyday.   Apply Vaseline and Band-Aid daily until healed.   Prior to procedure, discussed risks of blister formation, small wound, skin dyspigmentation, or rare scar following cryotherapy. Recommend Vaseline ointment to treated areas while healing.   Use Hydrocortisone 2.5% cream twice daily up to to weeks to lips Use Aquaphor ointment to lips several times daily.   Topical steroids (such as triamcinolone, fluocinolone, fluocinonide, mometasone, clobetasol, halobetasol, betamethasone, hydrocortisone) can cause thinning and lightening of the skin if they are used for too long in the same area. Your physician has selected the right strength medicine for your problem and area affected on the body. Please use your medication only as directed by your physician to prevent side effects.    If You Need Anything After Your Visit  If you have any questions or concerns for your doctor, please call our main line at [phone] and press option 4 to reach your doctor's medical assistant. If no one answers, please leave a voicemail as directed and we will return your call as soon as possible. Messages left after 4 pm will be answered the following business day.   You may also send [place] a message via [place]. We typically respond to MyChart messages within 1-2 business days.  For prescription refills, please ask your pharmacy to contact our office. Our fax number is [phone].  If you have an urgent issue when the clinic is closed that cannot wait until the next business day, you can page your doctor at the number below.    Please note that while we do our best to be available for urgent issues outside of office hours, we are not available 24/7.   If you have an urgent issue and are unable to reach [place], you may choose to seek medical care at your doctor's office, retail clinic, urgent care center, or emergency room.  If you have a medical  emergency, please immediately call 911 or go to the emergency department.  Pager Numbers  - Dr. Nehemiah Massed: [phone]  - Dr. Laurence Ferrari: [phone]  - Dr. Nicole Kindred: [phone]  In the event of inclement weather, please call our main line at [phone] for an update on the status of any delays or closures.  Dermatology Medication Tips: Please keep the boxes that topical medications come in in order to help keep track of the instructions about where and how to use these. Pharmacies typically print the medication instructions only on the boxes and not directly on the medication tubes.   If your medication is too expensive, please contact our office at [phone] option 4 or send [place] a message through [place].   We are unable to tell what your co-pay for medications will be in advance as this is different depending on your insurance coverage. However, we may be able to find a substitute medication at lower cost or fill out paperwork to get insurance to cover a needed medication.   If a prior authorization is required to get your medication covered by your insurance company, please allow [place] 1-2 business days to complete this process.  Drug prices often vary depending on where the prescription is filled and some pharmacies may offer cheaper prices.  The website www.goodrx.com contains coupons for medications through different pharmacies. The prices here do not account for what the cost may be with help from insurance (it may be cheaper with your insurance), but the website can give you the price if you did not  use any insurance.  - You can print the associated coupon and take it with your prescription to the pharmacy.  - You may also stop by our office during regular business hours and pick up a GoodRx coupon card.  - If you need your prescription sent electronically to a different pharmacy, notify our office through [facility] or by phone at [phone] option 4.     Si Usted  Necesita Algo Despus de Su Visita  Tambin puede enviarnos un mensaje a travs de Pharmacist, community. Por lo general respondemos a los mensajes de MyChart en el transcurso de 1 a 2 das hbiles.  Para renovar recetas, por favor pida a su farmacia que se ponga en contacto con nuestra oficina. Harland Dingwall de fax es [place] [phone].  Si tiene un asunto urgente cuando la clnica est cerrada y que no puede esperar hasta el siguiente da hbil, puede llamar/localizar a su doctor(a) al nmero que aparece a continuacin.   Por favor, tenga en cuenta que aunque hacemos todo lo posible para estar disponibles para asuntos urgentes fuera del horario de [place], no estamos disponibles las 24 horas del da, los 7 das de la [place].   Si tiene un problema urgente y no puede comunicarse con nosotros, puede optar por buscar atencin mdica  en el consultorio de su doctor(a), en una clnica privada, en un centro de atencin urgente o en una sala de emergencias.  Si tiene Engineering geologist, por favor llame inmediatamente al 911 o vaya a la sala de emergencias.  Nmeros de bper  - Dr. Nehemiah Massed: [phone]  - Dra. Moye: [phone]  - Dra. Nicole Kindred: [phone]  En caso de inclemencias del [place], por favor llame a Johnsie Kindred principal al [phone] para una actualizacin sobre el [place] de cualquier retraso o cierre.  Consejos para la medicacin en dermatologa: Por favor, guarde las cajas en las que vienen los medicamentos de uso tpico para ayudarle a seguir las instrucciones sobre dnde y cmo usarlos. Las farmacias generalmente imprimen las instrucciones del medicamento slo en las cajas y no directamente en los tubos del [place].   Si su medicamento es muy caro, por favor, pngase en contacto con Zigmund Daniel llamando al [phone] y presione la opcin 4 o envenos un mensaje a travs de Pharmacist, community.   No podemos decirle cul ser su copago por los medicamentos por adelantado ya que esto es  diferente dependiendo de la cobertura de su seguro. Sin embargo, es posible que podamos encontrar un medicamento sustituto a Electrical engineer un formulario para que el seguro cubra el medicamento que se considera necesario.   Si se requiere una autorizacin previa para que su compaa de seguros [place] su medicamento, por favor permtanos de 1 a 2 das hbiles para completar este proceso.  Los precios de los medicamentos varan con frecuencia dependiendo del Environmental consultant de dnde se surte la receta y alguna farmacias pueden ofrecer precios ms baratos.  El sitio web www.goodrx.com tiene cupones para medicamentos de Airline pilot. Los precios aqu no tienen en cuenta lo que podra costar con la ayuda del seguro (puede ser ms barato con su seguro), pero el sitio web puede darle el precio si no utiliz Research scientist (physical sciences).  - Puede imprimir el cupn correspondiente y llevarlo con su receta a la farmacia.  - Tambin puede pasar por nuestra oficina durante el horario de atencin regular y Charity fundraiser una tarjeta de cupones de GoodRx.  - Si necesita que su receta se enve electrnicamente  a [employer], informe a nuestra oficina a travs de MyChart de [place] o por telfono llamando al [phone] y presione la opcin 4.

## 2021-08-22 ENCOUNTER — Encounter: Payer: Self-pay | Admitting: Dermatology

## 2021-09-02 ENCOUNTER — Other Ambulatory Visit: Payer: Self-pay

## 2021-09-02 ENCOUNTER — Ambulatory Visit (INDEPENDENT_AMBULATORY_CARE_PROVIDER_SITE_OTHER): Payer: Medicare Other | Admitting: Internal Medicine

## 2021-09-02 ENCOUNTER — Encounter: Payer: Self-pay | Admitting: Internal Medicine

## 2021-09-02 VITALS — BP 132/70 | HR 90 | Temp 97.9°F | Resp 16 | Ht 64.0 in | Wt 207.6 lb

## 2021-09-02 DIAGNOSIS — R079 Chest pain, unspecified: Secondary | ICD-10-CM | POA: Diagnosis not present

## 2021-09-02 DIAGNOSIS — Z86010 Personal history of colonic polyps: Secondary | ICD-10-CM | POA: Diagnosis not present

## 2021-09-02 DIAGNOSIS — R198 Other specified symptoms and signs involving the digestive system and abdomen: Secondary | ICD-10-CM

## 2021-09-02 DIAGNOSIS — Z0000 Encounter for general adult medical examination without abnormal findings: Secondary | ICD-10-CM

## 2021-09-02 DIAGNOSIS — E1165 Type 2 diabetes mellitus with hyperglycemia: Secondary | ICD-10-CM

## 2021-09-02 DIAGNOSIS — I10 Essential (primary) hypertension: Secondary | ICD-10-CM | POA: Diagnosis not present

## 2021-09-02 DIAGNOSIS — K219 Gastro-esophageal reflux disease without esophagitis: Secondary | ICD-10-CM | POA: Diagnosis not present

## 2021-09-02 DIAGNOSIS — I700 Atherosclerosis of aorta: Secondary | ICD-10-CM | POA: Diagnosis not present

## 2021-09-02 DIAGNOSIS — E7800 Pure hypercholesterolemia, unspecified: Secondary | ICD-10-CM | POA: Diagnosis not present

## 2021-09-02 NOTE — Progress Notes (Signed)
Patient ID: Misty Jones, female   DOB: 02/12/1941, 81 y.o.   MRN: [id number]   Subjective:    Patient ID: Misty Jones, female    DOB: Jan 04, 1941, 81 y.o.   MRN: [id number]  This visit occurred during the SARS-CoV-2 public health emergency.  Safety protocols were in place, including screening questions prior to the visit, additional usage of staff PPE, and extensive cleaning of exam room while observing appropriate contact time as indicated for disinfecting solutions.    HPI With past history of diabetes, hypertension and hypercholesterolemia.  She comes in today to follow up on these issues as well as for a complete physical exam.  She reports she has been having intermittent chest pain/aching.  Notices more when sitting.  Was trying to ride her bike.  Limited by right knee issues.  Does report increased acid reflux last week.  Discussed PPI bid.  Breathing appears to be stable.  No increased cough or congestion.  Over the last 1-2 weeks, increased bowels movements.  Rectum sore.  Some abdominal discomfort.  Now bm's - 3x/day.     Past Medical History:  Diagnosis Date   B12 deficiency    Chicken pox    Diabetes mellitus (HCC)    GERD (gastroesophageal reflux disease)    History of diverticulitis of colon    History of dysplastic nevus 07/05/2018   left mid back shv - with moderate atypia   Hypercholesterolemia    Hypertension    Nasal drainage    Past Surgical History:  Procedure Laterality Date   ABDOMINAL HYSTERECTOMY  1984   BACK SURGERY  1986   ruptured disc, Dr Hardin Negus [facility])   [place]   COLONOSCOPY WITH PROPOFOL N/A 06/29/2016   Procedure: COLONOSCOPY WITH PROPOFOL;  Surgeon: Manya Silvas, MD;  Location: [place];  Service: Endoscopy;  Laterality: N/A;   ESOPHAGOGASTRODUODENOSCOPY (EGD) WITH PROPOFOL N/A 12/13/2017   Procedure: ESOPHAGOGASTRODUODENOSCOPY (EGD) WITH PROPOFOL;  Surgeon: Manya Silvas, MD;  Location:  [facility] ENDOSCOPY;  Service: Endoscopy;  Laterality: N/A;   TONSILLECTOMY     Family History  Problem Relation Age of Onset   Hypertension Mother    Cancer Mother        Mouth cancer   Heart disease Father        myocardial infarction   Raynaud syndrome Sister    Asthma Sister    Congenital heart disease Sister    Thyroid disease Sister    Thyroid cancer Daughter    Breast cancer Neg Hx    Colon cancer Neg Hx    Social History   Socioeconomic History   Marital status: Divorced    Spouse name: Not on file   Number of children: 2   Years of education: Not on file   Highest education level: Not on file  Occupational History   Occupation: retired  Tobacco Use   Smoking status: Former    Packs/day: 0.50    Years: 20.00    Pack years: 10.00    Types: Cigarettes    Quit date: 07/26/1984    Years since quitting: 37.1   Smokeless tobacco: Never   Tobacco comments:    quit smoking years ago  Vaping Use   Vaping Use: Never used  Substance and Sexual Activity   Alcohol use: No    Alcohol/week: 0.0 standard drinks   Drug use: No   Sexual activity: Never  Other Topics Concern   Not  on file  Social History Narrative   Not on file   Social Determinants of Health   Financial Resource Strain: Low Risk    Difficulty of Paying Living Expenses: Not hard at all  Food Insecurity: No Food Insecurity   Worried About Charity fundraiser in the Last Year: Never true   Arboriculturist in the Last Year: Never true  Transportation Needs: No Transportation Needs   Lack of Transportation (Medical): No   Lack of Transportation (Non-Medical): No  Physical Activity: Not on file  Stress: No Stress Concern Present   Feeling of Stress : Not at all  Social Connections: Unknown   Frequency of Communication with Friends and Family: More than three times a week   Frequency of Social Gatherings with Friends and Family: Not on file   Attends Religious Services: Not on file   Active Member of  Clubs or Organizations: Not on file   Attends Archivist Meetings: Not on file   Marital Status: Not on file     Review of Systems  Constitutional:  Negative for appetite change and unexpected weight change.  HENT:  Negative for congestion, sinus pressure and sore throat.   Eyes:  Negative for pain and visual disturbance.  Respiratory:  Negative for cough, chest tightness and shortness of breath.   Cardiovascular:  Positive for chest pain. Negative for palpitations and leg swelling.  Gastrointestinal:  Negative for nausea and vomiting.       Increased stool and abdominal discomfort as outlined.    Genitourinary:  Negative for difficulty urinating and dysuria.  Musculoskeletal:  Negative for joint swelling and myalgias.  Skin:  Negative for color change and rash.  Neurological:  Negative for dizziness, light-headedness and headaches.  Hematological:  Negative for adenopathy. Does not bruise/bleed easily.  Psychiatric/Behavioral:  Negative for agitation and dysphoric mood.       Objective:     BP 132/70    Pulse 90    Temp 97.9 F (36.6 C)    Resp 16    Ht 5' 4"  (1.626 m)    Wt 207 lb 9.6 oz (94.2 kg)    SpO2 98%    BMI 35.63 kg/m  Wt Readings from Last 3 Encounters:  09/02/21 207 lb 9.6 oz (94.2 kg)  04/30/21 203 lb 6.4 oz (92.3 kg)  01/21/21 202 lb (91.6 kg)    Physical Exam Vitals reviewed.  Constitutional:      General: She is not in acute distress.    Appearance: Normal appearance. She is well-developed.  HENT:     Head: Normocephalic and atraumatic.     Right Ear: External ear normal.     Left Ear: External ear normal.  Eyes:     General: No scleral icterus.       Right eye: No discharge.        Left eye: No discharge.     Conjunctiva/sclera: Conjunctivae normal.  Neck:     Thyroid: No thyromegaly.  Cardiovascular:     Rate and Rhythm: Normal rate and regular rhythm.  Pulmonary:     Effort: No tachypnea, accessory muscle usage or respiratory  distress.     Breath sounds: Normal breath sounds. No decreased breath sounds or wheezing.  Chest:  Breasts:    Right: No inverted nipple, mass, nipple discharge or tenderness (no axillary adenopathy).     Left: No inverted nipple, mass, nipple discharge or tenderness (no axilarry adenopathy).  Abdominal:  General: Bowel sounds are normal.     Palpations: Abdomen is soft.     Tenderness: There is no abdominal tenderness.  Musculoskeletal:        General: No swelling or tenderness.     Cervical back: Neck supple.  Lymphadenopathy:     Cervical: No cervical adenopathy.  Skin:    Findings: No erythema or rash.  Neurological:     Mental Status: She is alert and oriented to person, place, and time.  Psychiatric:        Mood and Affect: Mood normal.        Behavior: Behavior normal.     Outpatient Encounter Medications as of 09/02/2021  Medication Sig   albuterol (VENTOLIN HFA) 108 (90 Base) MCG/ACT inhaler Inhale 2 puffs into the lungs every 6 (six) hours as needed for wheezing or shortness of breath.   aspirin 81 MG tablet Take 81 mg by mouth daily.   benazepril (LOTENSIN) 40 MG tablet Take 1 tablet by mouth once daily   Calcium Carbonate-Vitamin D 600-400 MG-UNIT tablet Take 1 tablet by mouth 2 (two) times daily.   Cholecalciferol (VITAMIN D) 50 MCG (2000 UT) CAPS Take 1 capsule (2,000 Units total) by mouth daily.   cyanocobalamin (,VITAMIN B-12,) 1000 MCG/ML injection INJECT 1 ML INTRAMUSCULARLY ONCE EVERY MONTH   glucose blood (CONTOUR TEST) test strip USE 1 STRIP TO CHECK GLUCOSE ONCE DAILY.   hydrochlorothiazide (HYDRODIURIL) 25 MG tablet Take 1/2 (one-half) tablet by mouth once daily   Lancets MISC Check sugars once a day Dx: E11.9 (Contour Next)   metFORMIN (GLUCOPHAGE-XR) 500 MG 24 hr tablet Take 1 tablet by mouth twice daily   Omega-3 1000 MG CAPS Take 1,000 mg by mouth daily.   omeprazole (PRILOSEC) 20 MG capsule Take 20 mg by mouth daily.   Syringe/Needle, Disp,  (SYRINGE 3CC/25GX1") 25G X 1" 3 ML MISC Use as directed to administer b12 injections.   [DISCONTINUED] predniSONE (DELTASONE) 10 MG tablet Take 4 tablets x 1 day and then decrease by 1/2 tablet per day until down to zero mg.   No facility-administered encounter medications on file as of 09/02/2021.     Lab Results  Component Value Date   WBC 8.6 05/30/2020   HGB 13.2 05/30/2020   HCT 39.4 05/30/2020   PLT 334.0 05/30/2020   GLUCOSE 123 (H) 04/30/2021   CHOL 228 (H) 04/30/2021   TRIG 213.0 (H) 04/30/2021   HDL 54.00 04/30/2021   LDLDIRECT 141.0 04/30/2021   LDLCALC 48 01/15/2021   ALT 10 04/30/2021   AST 12 04/30/2021   NA 140 04/30/2021   K 4.6 04/30/2021   CL 100 04/30/2021   CREATININE 0.81 04/30/2021   BUN 18 04/30/2021   CO2 31 04/30/2021   TSH 3.54 04/30/2021   INR 0.9 04/03/2012   HGBA1C 7.1 (H) 04/30/2021   MICROALBUR <0.7 04/30/2021    MM 3D SCREEN BREAST BILATERAL  Result Date: 05/21/2021 CLINICAL DATA:  Screening. EXAM: DIGITAL SCREENING BILATERAL MAMMOGRAM WITH TOMOSYNTHESIS AND CAD TECHNIQUE: Bilateral screening digital craniocaudal and mediolateral oblique mammograms were obtained. Bilateral screening digital breast tomosynthesis was performed. The images were evaluated with computer-aided detection. COMPARISON:  Previous exam(s). ACR Breast Density Category b: There are scattered areas of fibroglandular density. FINDINGS: There are no findings suspicious for malignancy. IMPRESSION: No mammographic evidence of malignancy. A result letter of this screening mammogram will be mailed directly to the patient. RECOMMENDATION: Screening mammogram in one year. (Code:SM-B-01Y) BI-RADS CATEGORY  1: Negative. Electronically Signed  By: Dorise Bullion III M.D.   On: 05/21/2021 17:46      Assessment & Plan:   Problem List Items Addressed This Visit     Aortic atherosclerosis ([place])    Intolerant to statins and zetia.  Off repatha.  Low cholesterol diet and exercise.  Follow  lipid panel and liver function tests.   Lab Results  Component Value Date   CHOL 228 (H) 04/30/2021   HDL 54.00 04/30/2021   LDLCALC 48 01/15/2021   LDLDIRECT 141.0 04/30/2021   TRIG 213.0 (H) 04/30/2021   CHOLHDL 4 04/30/2021        Change in bowel movement    Increased acid reflux as outlined.  PPI bid.  Change in bowels as outlined.  Probiotic as directed.  Fiber as directed.  Bowels are better now.  Check routine labs.  Due colonoscopy as outlined.  Refer back to GI for further w/up and evaluation.       Relevant Orders   Ambulatory referral to Gastroenterology   Chest pain - Primary    Discussed chest discomfort.  May be more GI in origin.  Given risk factors and persistent intermittent pain, EKG - SR with no acute ischemic changes.  Will refer to cardiology for further evaluation and w/up.  Discussed possible Calcium scoring, stress testing, etc.  If any change or worsening symptoms or new symptoms, she is to be evaluated.  appt made with cardiology.       Relevant Orders   EKG 12-Lead (Completed)   Ambulatory referral to Cardiology   Diabetes mellitus ([place])    On metformin.  Continue low carb diet and exercise. Follow met b and a1c.  Has been on metformin.  Hold on changing.  Follow.  Lab Results  Component Value Date   HGBA1C 7.1 (H) 04/30/2021       Relevant Orders   Hemoglobin A1c   GERD (gastroesophageal reflux disease)    On prilosec.  Acid reflux as outlined.  GI symptoms as outlined.  Question if contributing to chest discomfort.  PPI bid.  Confirm on cardiac origin and then will need further GI evaluation for question of need for EGD.  Hold on scan at this time.  Follow.       Relevant Orders   Ambulatory referral to Gastroenterology   Health care maintenance    Physical today 09/02/21.  Mammogram 05/19/21 - Birads I.  Colonoscopy 06/2016 - tubular adenoma x 2 and hyperplastic polyp x 2.  Recommended f/u colonoscopy in 5 years.        History of colonic polyps     Colonoscopy 12.2017 - one polyp in ascending colon and two rectal polyps (tubular adenomas).  recommended f/u in 5 years.        Hypercholesterolemia    Intolerant to statins, zetia and repatha.  On no medication now.  Low cholesterol diet and exercise.  Follow lipid panel.       Relevant Orders   CBC with Differential/Platelet   Hepatic function panel   Lipid panel   Hypertension    Blood pressure is doing well.  Continue benazepril and hctz.  Follow pressures.  Follow metabolic panel.       Relevant Orders   Basic metabolic panel    I spent 45 minutes with the patient and more than 50% of the time was spent in consultation regarding the above.  Time spent discussing her current concerns and symptoms.  Time also spent discussing further w/up, evaluation and  treatment.   [name], MD

## 2021-09-02 NOTE — Assessment & Plan Note (Signed)
Physical today 09/02/21.  Mammogram 05/19/21 - Birads I.  Colonoscopy 06/2016 - tubular adenoma x 2 and hyperplastic polyp x 2.  Recommended f/u colonoscopy in 5 years.

## 2021-09-02 NOTE — Assessment & Plan Note (Signed)
Intolerant to statins, zetia and repatha.  On no medication now.  Low cholesterol diet and exercise.  Follow lipid panel.

## 2021-09-02 NOTE — Assessment & Plan Note (Signed)
Colonoscopy 12.2017 - one polyp in ascending colon and two rectal polyps (tubular adenomas).  recommended f/u in 5 years.

## 2021-09-07 ENCOUNTER — Encounter: Payer: Self-pay | Admitting: Internal Medicine

## 2021-09-07 NOTE — Assessment & Plan Note (Signed)
Intolerant to statins and zetia.  Off repatha.  Low cholesterol diet and exercise.  Follow lipid panel and liver function tests.   Lab Results  Component Value Date   CHOL 228 (H) 04/30/2021   HDL 54.00 04/30/2021   LDLCALC 48 01/15/2021   LDLDIRECT 141.0 04/30/2021   TRIG 213.0 (H) 04/30/2021   CHOLHDL 4 04/30/2021

## 2021-09-07 NOTE — Assessment & Plan Note (Signed)
On metformin.  Continue low carb diet and exercise. Follow met b and a1c.  Has been on metformin.  Hold on changing.  Follow.  Lab Results  Component Value Date   HGBA1C 7.1 (H) 04/30/2021

## 2021-09-07 NOTE — Assessment & Plan Note (Signed)
On prilosec.  Acid reflux as outlined.  GI symptoms as outlined.  Question if contributing to chest discomfort.  PPI bid.  Confirm on cardiac origin and then will need further GI evaluation for question of need for EGD.  Hold on scan at this time.  Follow.

## 2021-09-07 NOTE — Assessment & Plan Note (Signed)
Discussed chest discomfort.  May be more GI in origin.  Given risk factors and persistent intermittent pain, EKG - SR with no acute ischemic changes.  Will refer to cardiology for further evaluation and w/up.  Discussed possible Calcium scoring, stress testing, etc.  If any change or worsening symptoms or new symptoms, she is to be evaluated.  appt made with cardiology.

## 2021-09-07 NOTE — Assessment & Plan Note (Signed)
Increased acid reflux as outlined.  PPI bid.  Change in bowels as outlined.  Probiotic as directed.  Fiber as directed.  Bowels are better now.  Check routine labs.  Due colonoscopy as outlined.  Refer back to GI for further w/up and evaluation.

## 2021-09-07 NOTE — Assessment & Plan Note (Signed)
Blood pressure is doing well.  Continue benazepril and hctz.  Follow pressures.  Follow metabolic panel.

## 2021-09-15 DIAGNOSIS — I208 Other forms of angina pectoris: Secondary | ICD-10-CM | POA: Diagnosis not present

## 2021-09-15 DIAGNOSIS — R079 Chest pain, unspecified: Secondary | ICD-10-CM | POA: Diagnosis not present

## 2021-09-15 DIAGNOSIS — I700 Atherosclerosis of aorta: Secondary | ICD-10-CM | POA: Diagnosis not present

## 2021-09-15 DIAGNOSIS — E782 Mixed hyperlipidemia: Secondary | ICD-10-CM | POA: Diagnosis not present

## 2021-09-15 DIAGNOSIS — I10 Essential (primary) hypertension: Secondary | ICD-10-CM | POA: Diagnosis not present

## 2021-09-15 DIAGNOSIS — R9431 Abnormal electrocardiogram [ECG] [EKG]: Secondary | ICD-10-CM | POA: Insufficient documentation

## 2021-09-16 ENCOUNTER — Other Ambulatory Visit: Payer: Medicare Other

## 2021-09-24 ENCOUNTER — Other Ambulatory Visit: Payer: Self-pay

## 2021-09-24 ENCOUNTER — Other Ambulatory Visit (INDEPENDENT_AMBULATORY_CARE_PROVIDER_SITE_OTHER): Payer: Medicare Other

## 2021-09-24 ENCOUNTER — Other Ambulatory Visit: Payer: Self-pay | Admitting: Internal Medicine

## 2021-09-24 DIAGNOSIS — I10 Essential (primary) hypertension: Secondary | ICD-10-CM | POA: Diagnosis not present

## 2021-09-24 DIAGNOSIS — E1165 Type 2 diabetes mellitus with hyperglycemia: Secondary | ICD-10-CM | POA: Diagnosis not present

## 2021-09-24 DIAGNOSIS — D649 Anemia, unspecified: Secondary | ICD-10-CM

## 2021-09-24 DIAGNOSIS — E7800 Pure hypercholesterolemia, unspecified: Secondary | ICD-10-CM

## 2021-09-24 LAB — BASIC METABOLIC PANEL
BUN: 19 mg/dL (ref 6–23)
CO2: 33 mEq/L — ABNORMAL HIGH (ref 19–32)
Calcium: 9.2 mg/dL (ref 8.4–10.5)
Chloride: 100 mEq/L (ref 96–112)
Creatinine, Ser: 0.88 mg/dL (ref 0.40–1.20)
GFR: 61.64 mL/min (ref >60.00)
Glucose, Bld: 122 mg/dL — ABNORMAL HIGH (ref 70–99)
Potassium: 4.6 mEq/L (ref 3.5–5.1)
Sodium: 140 mEq/L (ref 135–145)

## 2021-09-24 LAB — HEPATIC FUNCTION PANEL
ALT: 17 U/L (ref 0–35)
AST: 15 U/L (ref 0–37)
Albumin: 4 g/dL (ref 3.5–5.2)
Alkaline Phosphatase: 60 U/L (ref 39–117)
Bilirubin, Direct: 0.1 mg/dL (ref 0.0–0.3)
Total Bilirubin: 0.6 mg/dL (ref 0.2–1.2)
Total Protein: 6 g/dL (ref 6.0–8.3)

## 2021-09-24 LAB — LIPID PANEL
Cholesterol: 250 mg/dL — ABNORMAL HIGH (ref 0–200)
HDL: 50.2 mg/dL (ref >39.00)
NonHDL: 199.41
Total CHOL/HDL Ratio: 5
Triglycerides: 207 mg/dL — ABNORMAL HIGH (ref 0.0–149.0)
VLDL: 41.4 mg/dL — ABNORMAL HIGH (ref 0.0–40.0)

## 2021-09-24 LAB — CBC WITH DIFFERENTIAL/PLATELET
Basophils Absolute: 0.1 10*3/uL (ref 0.0–0.1)
Basophils Relative: 1 % (ref 0.0–3.0)
Eosinophils Absolute: 0.1 10*3/uL (ref 0.0–0.7)
Eosinophils Relative: 1.2 % (ref 0.0–5.0)
HCT: 37.6 % (ref 36.0–46.0)
Hemoglobin: 11.9 g/dL — ABNORMAL LOW (ref 12.0–15.0)
Lymphocytes Relative: 27.9 % (ref 12.0–46.0)
Lymphs Abs: 2.1 10*3/uL (ref 0.7–4.0)
MCHC: 31.7 g/dL (ref 30.0–36.0)
MCV: 91.6 fl (ref 78.0–100.0)
Monocytes Absolute: 0.5 10*3/uL (ref 0.1–1.0)
Monocytes Relative: 7.1 % (ref 3.0–12.0)
Neutro Abs: 4.7 10*3/uL (ref 1.4–7.7)
Neutrophils Relative %: 62.8 % (ref 43.0–77.0)
Platelets: 321 10*3/uL (ref 150.0–400.0)
RBC: 4.1 Mil/uL (ref 3.87–5.11)
RDW: 14.3 % (ref 11.5–15.5)
WBC: 7.5 10*3/uL (ref 4.0–10.5)

## 2021-09-24 LAB — LDL CHOLESTEROL, DIRECT: Direct LDL: 158 mg/dL

## 2021-09-24 LAB — HEMOGLOBIN A1C: Hgb A1c MFr Bld: 7.1 % — ABNORMAL HIGH (ref 4.6–6.5)

## 2021-09-24 NOTE — Progress Notes (Signed)
Order placed for add on lab.  °

## 2021-09-25 LAB — IBC + FERRITIN
Ferritin: 18.4 ng/mL (ref 10.0–291.0)
Iron: 61 ug/dL (ref 42–145)
Saturation Ratios: 16.5 % — ABNORMAL LOW (ref 20.0–50.0)
TIBC: 369.6 ug/dL (ref 250.0–450.0)
Transferrin: 264 mg/dL (ref 212.0–360.0)

## 2021-09-26 DIAGNOSIS — E119 Type 2 diabetes mellitus without complications: Secondary | ICD-10-CM | POA: Diagnosis not present

## 2021-09-29 ENCOUNTER — Other Ambulatory Visit: Payer: Self-pay | Admitting: Internal Medicine

## 2021-09-29 MED ORDER — CYANOCOBALAMIN 1000 MCG/ML IJ SOLN: INTRAMUSCULAR | 0 refills | Status: DC

## 2021-10-02 DIAGNOSIS — I208 Other forms of angina pectoris: Secondary | ICD-10-CM | POA: Diagnosis not present

## 2021-10-02 DIAGNOSIS — R9431 Abnormal electrocardiogram [ECG] [EKG]: Secondary | ICD-10-CM | POA: Diagnosis not present

## 2021-10-02 DIAGNOSIS — I700 Atherosclerosis of aorta: Secondary | ICD-10-CM | POA: Diagnosis not present

## 2021-10-02 DIAGNOSIS — I6522 Occlusion and stenosis of left carotid artery: Secondary | ICD-10-CM | POA: Diagnosis not present

## 2021-10-06 ENCOUNTER — Telehealth: Payer: Self-pay

## 2021-10-06 NOTE — Telephone Encounter (Signed)
LMTCB for lab results.  

## 2021-10-08 ENCOUNTER — Other Ambulatory Visit: Payer: Self-pay | Admitting: Internal Medicine

## 2021-10-10 DIAGNOSIS — H353124 Nonexudative age-related macular degeneration, left eye, advanced atrophic with subfoveal involvement: Secondary | ICD-10-CM | POA: Diagnosis not present

## 2021-10-10 DIAGNOSIS — H04123 Dry eye syndrome of bilateral lacrimal glands: Secondary | ICD-10-CM | POA: Diagnosis not present

## 2021-10-10 DIAGNOSIS — H353211 Exudative age-related macular degeneration, right eye, with active choroidal neovascularization: Secondary | ICD-10-CM | POA: Diagnosis not present

## 2021-10-27 DIAGNOSIS — I700 Atherosclerosis of aorta: Secondary | ICD-10-CM | POA: Diagnosis not present

## 2021-10-27 DIAGNOSIS — I10 Essential (primary) hypertension: Secondary | ICD-10-CM | POA: Diagnosis not present

## 2021-10-27 DIAGNOSIS — E782 Mixed hyperlipidemia: Secondary | ICD-10-CM | POA: Diagnosis not present

## 2021-11-04 ENCOUNTER — Encounter: Payer: Self-pay | Admitting: Internal Medicine

## 2021-11-04 ENCOUNTER — Ambulatory Visit (INDEPENDENT_AMBULATORY_CARE_PROVIDER_SITE_OTHER): Payer: Medicare Other | Admitting: Internal Medicine

## 2021-11-04 VITALS — BP 126/80 | HR 95 | Temp 98.0°F | Resp 18 | Ht 64.0 in | Wt 209.2 lb

## 2021-11-04 DIAGNOSIS — K219 Gastro-esophageal reflux disease without esophagitis: Secondary | ICD-10-CM

## 2021-11-04 DIAGNOSIS — I700 Atherosclerosis of aorta: Secondary | ICD-10-CM | POA: Diagnosis not present

## 2021-11-04 DIAGNOSIS — Z860100 Personal history of colon polyps, unspecified: Secondary | ICD-10-CM

## 2021-11-04 DIAGNOSIS — R079 Chest pain, unspecified: Secondary | ICD-10-CM | POA: Diagnosis not present

## 2021-11-04 DIAGNOSIS — R21 Rash and other nonspecific skin eruption: Secondary | ICD-10-CM

## 2021-11-04 DIAGNOSIS — I10 Essential (primary) hypertension: Secondary | ICD-10-CM

## 2021-11-04 DIAGNOSIS — D649 Anemia, unspecified: Secondary | ICD-10-CM

## 2021-11-04 DIAGNOSIS — E1165 Type 2 diabetes mellitus with hyperglycemia: Secondary | ICD-10-CM | POA: Diagnosis not present

## 2021-11-04 DIAGNOSIS — E7800 Pure hypercholesterolemia, unspecified: Secondary | ICD-10-CM | POA: Diagnosis not present

## 2021-11-04 MED ORDER — EMPAGLIFLOZIN 10 MG PO TABS: 10.0000 mg | ORAL_TABLET | Freq: Every day | ORAL | 2 refills | Status: DC

## 2021-11-04 NOTE — Patient Instructions (Signed)
Start jardiance '10mg'$  - one per day ?

## 2021-11-04 NOTE — Progress Notes (Signed)
Patient ID: Misty Jones, female   DOB: Aug 23, 1940, 81 y.o.   MRN: [id number] ? ? ?Subjective:  ? ? Patient ID: Misty Jones, female    DOB: 12/15/1940, 81 y.o.   MRN: [id number] ? ?This visit occurred during the SARS-CoV-2 public health emergency.  Safety protocols were in place, including screening questions prior to the visit, additional usage of staff PPE, and extensive cleaning of exam room while observing appropriate contact time as indicated for disinfecting solutions.  ? ?Patient here for a scheduled follow up.  ? ?Chief Complaint  ?Patient presents with  ? Follow-up  ?  F/u for chest pain - pt states feeling ok. Saw Cardio and was cleared for 1 yr. Wishes to discuss today metformin use. Pt states medication gives her excessive bowel movements and acid reflux.  Samples of Jardiance '10mg'$  were given to the patient, quantity 4 boxes, Lot Number [id number].   ? .  ? ?HPI ?Recently evaluated by cardiology.  ECHO - NORMAL LEFT VENTRICULAR SYSTOLIC FUNCTION. NORMAL RIGHT VENTRICULAR SYSTOLIC FUNCTION MODERATE VALVULAR REGURGITATION (See above)  ?NO VALVULAR STENOSIS.  Just had f/u 10/27/21 - no further testing ordered.  Recommended f/u in one year.  Apparently did recommend increasing niacin. No increased chest pain or sob reported.  No abdominal pain.  Is concerned regarding GI issues with metformin.  Reports excessive bowel movements.  Feels getting worse.  Also, persistent rash/irritation - above lip.  Request referral to dermatology.  ? ? ?Past Medical History:  ?Diagnosis Date  ? B12 deficiency   ? Chicken pox   ? Diabetes mellitus ([place])   ? GERD (gastroesophageal reflux disease)   ? History of diverticulitis of colon   ? History of dysplastic nevus 07/05/2018  ? left mid back shv - with moderate atypia  ? Hypercholesterolemia   ? Hypertension   ? Nasal drainage   ? ?Past Surgical History:  ?Procedure Laterality Date  ? ABDOMINAL HYSTERECTOMY  1984  ? [place]  ? ruptured disc, Dr Hardin Negus [facility])  ?  CHOLECYSTECTOMY  1991  ? CHOLECYSTECTOMY  1992  ? COLONOSCOPY WITH PROPOFOL N/A 06/29/2016  ? Procedure: COLONOSCOPY WITH PROPOFOL;  Surgeon: Manya Silvas, MD;  Location: [facility] ENDOSCOPY;  Service: Endoscopy;  Laterality: N/A;  ? ESOPHAGOGASTRODUODENOSCOPY (EGD) WITH PROPOFOL N/A 12/13/2017  ? Procedure: ESOPHAGOGASTRODUODENOSCOPY (EGD) WITH PROPOFOL;  Surgeon: Manya Silvas, MD;  Location: [facility] ENDOSCOPY;  Service: Endoscopy;  Laterality: N/A;  ? TONSILLECTOMY    ? ?Family History  ?Problem Relation Age of Onset  ? Hypertension Mother   ? Cancer Mother   ?     Mouth cancer  ? Heart disease Father   ?     myocardial infarction  ? Raynaud syndrome Sister   ? Asthma Sister   ? Congenital heart disease Sister   ? Thyroid disease Sister   ? Thyroid cancer Daughter   ? Breast cancer Neg Hx   ? Colon cancer Neg Hx   ? ?Social History  ? ?Socioeconomic History  ? Marital status: Divorced  ?  Spouse name: Not on file  ? Number of children: 2  ? Years of education: Not on file  ? Highest education level: Not on file  ?Occupational History  ? Occupation: retired  ?Tobacco Use  ? Smoking status: Former  ?  Packs/day: 0.50  ?  Years: 20.00  ?  Pack years: 10.00  ?  Types: Cigarettes  ?  Quit date: 07/26/1984  ?  Years since quitting: 37.3  ? Smokeless tobacco: Never  ? Tobacco comments:  ?  quit smoking years ago  ?Vaping Use  ? Vaping Use: Never used  ?Substance and Sexual Activity  ? Alcohol use: No  ?  Alcohol/week: 0.0 standard drinks  ? Drug use: No  ? Sexual activity: Never  ?Other Topics Concern  ? Not on file  ?Social History Narrative  ? Not on file  ? ?Social Determinants of Health  ? ?Financial Resource Strain: Low Risk   ? Difficulty of Paying Living Expenses: Not hard at all  ?Food Insecurity: No Food Insecurity  ? Worried About Charity fundraiser in the Last Year: Never true  ? Ran Out of Food in the Last Year: Never true  ?Transportation Needs: No Transportation Needs  ? Lack of Transportation (Medical): No   ? Lack of Transportation (Non-Medical): No  ?Physical Activity: Not on file  ?Stress: No Stress Concern Present  ? Feeling of Stress : Not at all  ?Social Connections: Unknown  ? Frequency of Communication with Friends and Family: More than three times a week  ? Frequency of Social Gatherings with Friends and Family: Not on file  ? Attends Religious Services: Not on file  ? Active Member of Clubs or Organizations: Not on file  ? Attends Archivist Meetings: Not on file  ? Marital Status: Not on file  ? ? ? ?Review of Systems  ?Constitutional:  Negative for appetite change and unexpected weight change.  ?HENT:  Negative for congestion and sinus pressure.   ?Respiratory:  Negative for cough, chest tightness and shortness of breath.   ?Cardiovascular:  Negative for chest pain, palpitations and leg swelling.  ?Gastrointestinal:  Negative for nausea and vomiting.  ?     Increased bowel movements as outlined.   ?Genitourinary:  Negative for difficulty urinating and dysuria.  ?Musculoskeletal:  Negative for joint swelling and myalgias.  ?Skin:  Negative for color change and rash.  ?Neurological:  Negative for dizziness, light-headedness and headaches.  ?Psychiatric/Behavioral:  Negative for agitation and dysphoric mood.   ? ?   ?Objective:  ?  ? ?BP 126/80 (BP Location: Left Arm, Patient Position: Sitting, Cuff Size: Large)   Pulse 95   Temp 98 ?F (36.7 ?C) (Temporal)   Resp 18   Ht '5\' 4"'$  (1.626 m)   Wt 209 lb 3.2 oz (94.9 kg)   SpO2 97%   BMI 35.91 kg/m?  ?Wt Readings from Last 3 Encounters:  ?11/04/21 209 lb 3.2 oz (94.9 kg)  ?09/02/21 207 lb 9.6 oz (94.2 kg)  ?04/30/21 203 lb 6.4 oz (92.3 kg)  ? ? ?Physical Exam ?Vitals reviewed.  ?Constitutional:   ?   General: She is not in acute distress. ?   Appearance: Normal appearance.  ?HENT:  ?   Head: Normocephalic and atraumatic.  ?   Right Ear: External ear normal.  ?   Left Ear: External ear normal.  ?Eyes:  ?   General: No scleral icterus.    ?   Right  eye: No discharge.     ?   Left eye: No discharge.  ?   Conjunctiva/sclera: Conjunctivae normal.  ?Neck:  ?   Thyroid: No thyromegaly.  ?Cardiovascular:  ?   Rate and Rhythm: Normal rate and regular rhythm.  ?Pulmonary:  ?   Effort: No respiratory distress.  ?   Breath sounds: Normal breath sounds. No wheezing.  ?Abdominal:  ?   General: Bowel sounds  are normal.  ?   Palpations: Abdomen is soft.  ?   Tenderness: There is no abdominal tenderness.  ?Musculoskeletal:     ?   General: No swelling or tenderness.  ?   Cervical back: Neck supple. No tenderness.  ?Lymphadenopathy:  ?   Cervical: No cervical adenopathy.  ?Skin: ?   Findings: No erythema or rash.  ?Neurological:  ?   Mental Status: She is alert.  ?Psychiatric:     ?   Mood and Affect: Mood normal.     ?   Behavior: Behavior normal.  ? ? ? ?Outpatient Encounter Medications as of 11/04/2021  ?Medication Sig  ? albuterol (VENTOLIN HFA) 108 (90 Base) MCG/ACT inhaler Inhale 2 puffs into the lungs every 6 (six) hours as needed for wheezing or shortness of breath.  ? aspirin 81 MG tablet Take 81 mg by mouth daily.  ? benazepril (LOTENSIN) 40 MG tablet Take 1 tablet by mouth once daily  ? Calcium Carbonate-Vitamin D 600-400 MG-UNIT tablet Take 1 tablet by mouth 2 (two) times daily.  ? Cholecalciferol (VITAMIN D) 50 MCG (2000 UT) CAPS Take 1 capsule (2,000 Units total) by mouth daily.  ? cyanocobalamin (,VITAMIN B-12,) 1000 MCG/ML injection INJECT 1 ML INTRAMUSCULARLY ONCE EVERY MONTH  ? empagliflozin (JARDIANCE) 10 MG TABS tablet Take 1 tablet (10 mg total) by mouth daily before breakfast.  ? glucose blood (CONTOUR TEST) test strip USE 1 STRIP TO CHECK GLUCOSE ONCE DAILY.  ? hydrochlorothiazide (HYDRODIURIL) 25 MG tablet Take 1/2 (one-half) tablet by mouth once daily  ? Lancets MISC Check sugars once a day Dx: E11.9 (Contour Next)  ? metFORMIN (GLUCOPHAGE-XR) 500 MG 24 hr tablet Take 1 tablet by mouth twice daily  ? Multiple Vitamins-Minerals (PRESERVISION AREDS 2 PO)  Take by mouth.  ? niacin 500 MG tablet Take 500 mg by mouth at bedtime.  ? Omega-3 1000 MG CAPS Take 1,000 mg by mouth daily.  ? omeprazole (PRILOSEC) 20 MG capsule Take 20 mg by mouth daily.  ? Syringe/Nee

## 2021-11-07 DIAGNOSIS — H353211 Exudative age-related macular degeneration, right eye, with active choroidal neovascularization: Secondary | ICD-10-CM | POA: Diagnosis not present

## 2021-11-09 ENCOUNTER — Telehealth: Payer: Self-pay | Admitting: Internal Medicine

## 2021-11-09 ENCOUNTER — Encounter: Payer: Self-pay | Admitting: Internal Medicine

## 2021-11-09 NOTE — Telephone Encounter (Signed)
Misty Jones was recently prescribed jardiance.  I gave her samples to get her started, but a prescription for jardiance '10mg'$  q day was sent in to the pharmacy.  She does not have drug coverage.  Can you help [place] with pt assistance.  Thank you.  ?

## 2021-11-09 NOTE — Assessment & Plan Note (Signed)
Intolerant to statins, zetia and repatha.  On no medication now.  Low cholesterol diet and exercise.  Follow lipid panel.  ?

## 2021-11-09 NOTE — Assessment & Plan Note (Addendum)
Concerned metformin causing increased GI issues - increased bowel movements.  Low carb diet and exercise.  Trial of jardiance.  Stay hydrated.  Follow.  Samples given to try.  Will hold metformin.  ?

## 2021-11-09 NOTE — Assessment & Plan Note (Signed)
Intolerant to statins and zetia.  Off repatha.  Low cholesterol diet and exercise.  Follow lipid panel and liver function tests.   ?Lab Results  ?Component Value Date  ? CHOL 250 (H) 09/24/2021  ? HDL 50.20 09/24/2021  ? [place] 48 01/15/2021  ? LDLDIRECT 158.0 09/24/2021  ? TRIG 207.0 (H) 09/24/2021  ? CHOLHDL 5 09/24/2021  ? ?

## 2021-11-09 NOTE — Assessment & Plan Note (Signed)
Continue prilosec.  No chest pain or increased symptoms reported.  Follow.  

## 2021-11-09 NOTE — Assessment & Plan Note (Signed)
Blood pressure is doing well.  Continue benazepril and hctz.  Follow pressures.  Follow metabolic panel.  ?

## 2021-11-09 NOTE — Assessment & Plan Note (Signed)
Recently evaluated by cardiology as outlined.  ECHO as outlined.  No chest pain currently.  Follow.   ?

## 2021-11-09 NOTE — Assessment & Plan Note (Signed)
Colonoscopy 12.2017 - one polyp in ascending colon and two rectal polyps (tubular adenomas).  recommended f/u in 5 years.   ?

## 2021-11-09 NOTE — Assessment & Plan Note (Signed)
Persistent rash above lip.  Has tried various topical medications including low dose hydrocortisone and aquaphor.  Have instructed her to stop HC cream.  Refer to dermatology.   ?

## 2021-11-10 ENCOUNTER — Encounter: Payer: Self-pay | Admitting: Internal Medicine

## 2021-11-10 ENCOUNTER — Telehealth: Payer: Self-pay | Admitting: Pharmacy Technician

## 2021-11-10 NOTE — Telephone Encounter (Signed)
I did give her samples to cover 28 days.  Just let me know if I need to do anything.  Thank you for your help ?

## 2021-11-10 NOTE — Telephone Encounter (Signed)
Attempted to call patient to make aware of [facility]'s services.  Unable to reach.  Left a message. ? ?Jacquelynn Cree ?Care Manager ?[facility] ?

## 2021-11-18 ENCOUNTER — Encounter: Payer: Self-pay | Admitting: Internal Medicine

## 2021-11-20 NOTE — Telephone Encounter (Signed)
Spoke with [employer] pharmacist - paper work being faxed to my attention. ?Spoke with patient - advised working on test strips. ?

## 2021-12-05 DIAGNOSIS — H353124 Nonexudative age-related macular degeneration, left eye, advanced atrophic with subfoveal involvement: Secondary | ICD-10-CM | POA: Diagnosis not present

## 2021-12-05 DIAGNOSIS — H353211 Exudative age-related macular degeneration, right eye, with active choroidal neovascularization: Secondary | ICD-10-CM | POA: Diagnosis not present

## 2021-12-05 DIAGNOSIS — H04123 Dry eye syndrome of bilateral lacrimal glands: Secondary | ICD-10-CM | POA: Diagnosis not present

## 2021-12-12 ENCOUNTER — Telehealth (INDEPENDENT_AMBULATORY_CARE_PROVIDER_SITE_OTHER): Payer: Medicare Other | Admitting: Internal Medicine

## 2021-12-12 ENCOUNTER — Encounter: Payer: Self-pay | Admitting: Internal Medicine

## 2021-12-12 ENCOUNTER — Other Ambulatory Visit (INDEPENDENT_AMBULATORY_CARE_PROVIDER_SITE_OTHER): Payer: Medicare Other

## 2021-12-12 ENCOUNTER — Other Ambulatory Visit: Payer: Self-pay

## 2021-12-12 ENCOUNTER — Telehealth: Payer: Self-pay | Admitting: Internal Medicine

## 2021-12-12 DIAGNOSIS — I10 Essential (primary) hypertension: Secondary | ICD-10-CM

## 2021-12-12 DIAGNOSIS — R300 Dysuria: Secondary | ICD-10-CM

## 2021-12-12 LAB — POCT URINALYSIS DIPSTICK
Bilirubin, UA: NEGATIVE
Blood, UA: NEGATIVE
Glucose, UA: NEGATIVE
Ketones, UA: NEGATIVE
Nitrite, UA: NEGATIVE
Protein, UA: NEGATIVE
Spec Grav, UA: 1.02 (ref 1.010–1.025)
Urobilinogen, UA: 0.2 E.U./dL
pH, UA: 5.5 (ref 5.0–8.0)

## 2021-12-12 LAB — URINALYSIS, MICROSCOPIC ONLY

## 2021-12-12 MED ORDER — NYSTATIN 100000 UNIT/GM EX CREA: 1.0000 "application " | TOPICAL_CREAM | Freq: Two times a day (BID) | CUTANEOUS | 0 refills | Status: DC

## 2021-12-12 NOTE — Progress Notes (Unsigned)
Patient ID: Misty Jones, female   DOB: 30-Dec-1940, 81 y.o.   MRN: [id number]   Virtual Visit via video Note  This visit type was conducted due to national recommendations for restrictions regarding the COVID-19 pandemic (e.g. social distancing).  This format is felt to be most appropriate for this patient at this time.  All issues noted in this document were discussed and addressed.  No physical exam was performed (except for noted visual exam findings with Video Visits).   I connected with [name] by a video enabled telemedicine application and verified that I am speaking with the correct person using two identifiers. Location patient: home Location provider: work  Persons participating in the virtual visit: patient, provider  The limitations, risks, security and privacy concerns of performing an evaluation and management service by video and the availability of in person appointments have been discussed.  It has also been discussed with the patient that there may be a patient responsible charge related to this service. The patient expressed understanding and agreed to proceed.   Reason for visit: work in appt  HPI: Work in with concerns regarding some vaginal burning and questions of UTI.  States symptoms started Monday 12/08/21.  Some burning with urination.  Feels some vaginal burning.  No fever.  No hematuria.  One episode of nausea.  Lasted 5-10 minutes and resolved.  No vomiting.  Staying hydrated.  No change in back pain.  Some pressure - bladder.  Some increased urinary frequency and nocturia - not sure if changed.  No vaginal discharge.    ROS: See pertinent positives and negatives per HPI.  Past Medical History:  Diagnosis Date   B12 deficiency    Chicken pox    Diabetes mellitus (HCC)    GERD (gastroesophageal reflux disease)    History of diverticulitis of colon    History of dysplastic nevus 07/05/2018   left mid back shv - with moderate atypia    Hypercholesterolemia    Hypertension    Nasal drainage     Past Surgical History:  Procedure Laterality Date   ABDOMINAL HYSTERECTOMY  1984   BACK SURGERY  1986   ruptured disc, Dr Hardin Negus [facility])   [place]   COLONOSCOPY WITH PROPOFOL N/A 06/29/2016   Procedure: COLONOSCOPY WITH PROPOFOL;  Surgeon: Manya Silvas, MD;  Location: [place];  Service: Endoscopy;  Laterality: N/A;   ESOPHAGOGASTRODUODENOSCOPY (EGD) WITH PROPOFOL N/A 12/13/2017   Procedure: ESOPHAGOGASTRODUODENOSCOPY (EGD) WITH PROPOFOL;  Surgeon: Manya Silvas, MD;  Location: [facility] ENDOSCOPY;  Service: Endoscopy;  Laterality: N/A;   TONSILLECTOMY      Family History  Problem Relation Age of Onset   Hypertension Mother    Cancer Mother        Mouth cancer   Heart disease Father        myocardial infarction   Raynaud syndrome Sister    Asthma Sister    Congenital heart disease Sister    Thyroid disease Sister    Thyroid cancer Daughter    Breast cancer Neg Hx    Colon cancer Neg Hx     SOCIAL HX: reviewed.    Current Outpatient Medications:    aspirin 81 MG tablet, Take 81 mg by mouth daily., Disp: , Rfl:    benazepril (LOTENSIN) 40 MG tablet, Take 1 tablet by mouth once daily, Disp: 90 tablet, Rfl: 0   cyanocobalamin (,VITAMIN B-12,) 1000 MCG/ML injection, INJECT 1 ML INTRAMUSCULARLY  ONCE EVERY MONTH, Disp: 30 mL, Rfl: 0   glucose blood (CONTOUR TEST) test strip, USE 1 STRIP TO CHECK GLUCOSE ONCE DAILY., Disp: 50 each, Rfl: 5   hydrochlorothiazide (HYDRODIURIL) 25 MG tablet, Take 1/2 (one-half) tablet by mouth once daily, Disp: 90 tablet, Rfl: 0   Lancets MISC, Check sugars once a day Dx: E11.9 (Contour Next), Disp: 100 each, Rfl: 3   metFORMIN (GLUCOPHAGE) 500 MG tablet, Take 1 tablet by mouth 2 (two) times daily with a meal., Disp: , Rfl:    metFORMIN (GLUCOPHAGE-XR) 500 MG 24 hr tablet, Take 1 tablet by mouth twice daily, Disp: 180 tablet, Rfl: 0    Multiple Vitamins-Minerals (PRESERVISION AREDS 2 PO), Take by mouth., Disp: , Rfl:    niacin 500 MG tablet, Take 500 mg by mouth at bedtime., Disp: , Rfl:    nystatin cream (MYCOSTATIN), Apply 1 application. topically 2 (two) times daily., Disp: 30 g, Rfl: 0   omeprazole (PRILOSEC) 20 MG capsule, Take 20 mg by mouth daily., Disp: , Rfl:    Syringe/Needle, Disp, (SYRINGE 3CC/25GX1") 25G X 1" 3 ML MISC, Use as directed to administer b12 injections., Disp: 50 each, Rfl: 1  EXAM:  GENERAL: alert, oriented, appears well and in no acute distress  HEENT: atraumatic, conjunttiva clear, no obvious abnormalities on inspection of external nose and ears  NECK: normal movements of the head and neck  LUNGS: on inspection no signs of respiratory distress, breathing rate appears normal, no obvious gross SOB, gasping or wheezing  CV: no obvious cyanosis  PSYCH/NEURO: pleasant and cooperative, no obvious depression or anxiety, speech and thought processing grossly intact  ASSESSMENT AND PLAN:  Discussed the following assessment and plan:  Problem List Items Addressed This Visit     Dysuria    Noticed some burning with urination.  Unclear if vaginal burning.  Urine dip small leukocytes.  Urine micro - negative.  Will send for culture.  Treat with nystatin cream as directed.  Hold oral abx until culture results available - unless symptoms worsen.  She will keep me posted.         Hypertension    Blood pressure has been doing well.  Continue benazepril and hctz.  Follow pressures.  Follow metabolic panel.         Return if symptoms worsen or fail to improve.   I discussed the assessment and treatment plan with the patient. The patient was provided an opportunity to ask questions and all were answered. The patient agreed with the plan and demonstrated an understanding of the instructions.   The patient was advised to call back or seek an in-person evaluation if the symptoms worsen or if the  condition fails to improve as anticipated.   [name], MD

## 2021-12-12 NOTE — Telephone Encounter (Signed)
I can see her today.  Have her do a urine - order urine dip, micro and culture.  I can work her in at 4:30 today. Thanks

## 2021-12-12 NOTE — Telephone Encounter (Signed)
My chart message sent to pt for update.   

## 2021-12-13 ENCOUNTER — Encounter: Payer: Self-pay | Admitting: Internal Medicine

## 2021-12-13 LAB — URINE CULTURE
MICRO NUMBER:: 13420527
SPECIMEN QUALITY:: ADEQUATE

## 2021-12-13 NOTE — Assessment & Plan Note (Signed)
Noticed some burning with urination.  Unclear if vaginal burning.  Urine dip small leukocytes.  Urine micro - negative.  Will send for culture.  Treat with nystatin cream as directed.  Hold oral abx until culture results available - unless symptoms worsen.  She will keep me posted.

## 2021-12-13 NOTE — Assessment & Plan Note (Signed)
Blood pressure has been doing well.  Continue benazepril and hctz.  Follow pressures.  Follow metabolic panel.  

## 2021-12-15 ENCOUNTER — Other Ambulatory Visit: Payer: Self-pay | Admitting: Family

## 2021-12-15 ENCOUNTER — Other Ambulatory Visit: Payer: Self-pay | Admitting: Internal Medicine

## 2021-12-15 ENCOUNTER — Ambulatory Visit: Payer: Medicare Other | Admitting: Family

## 2021-12-25 DIAGNOSIS — L710 Perioral dermatitis: Secondary | ICD-10-CM | POA: Diagnosis not present

## 2021-12-31 ENCOUNTER — Other Ambulatory Visit (INDEPENDENT_AMBULATORY_CARE_PROVIDER_SITE_OTHER): Payer: Medicare Other

## 2021-12-31 DIAGNOSIS — R300 Dysuria: Secondary | ICD-10-CM | POA: Diagnosis not present

## 2021-12-31 DIAGNOSIS — E1165 Type 2 diabetes mellitus with hyperglycemia: Secondary | ICD-10-CM | POA: Diagnosis not present

## 2021-12-31 DIAGNOSIS — D649 Anemia, unspecified: Secondary | ICD-10-CM | POA: Diagnosis not present

## 2021-12-31 DIAGNOSIS — E7800 Pure hypercholesterolemia, unspecified: Secondary | ICD-10-CM | POA: Diagnosis not present

## 2021-12-31 LAB — URINALYSIS, ROUTINE W REFLEX MICROSCOPIC
Bilirubin Urine: NEGATIVE
Hgb urine dipstick: NEGATIVE
Ketones, ur: NEGATIVE
Nitrite: NEGATIVE
Specific Gravity, Urine: 1.02 (ref 1.000–1.030)
Total Protein, Urine: NEGATIVE
Urine Glucose: NEGATIVE
Urobilinogen, UA: 0.2 (ref 0.0–1.0)
pH: 6 (ref 5.0–8.0)

## 2021-12-31 LAB — CBC WITH DIFFERENTIAL/PLATELET
Basophils Absolute: 0.1 10*3/uL (ref 0.0–0.1)
Basophils Relative: 0.8 % (ref 0.0–3.0)
Eosinophils Absolute: 0.1 10*3/uL (ref 0.0–0.7)
Eosinophils Relative: 1.6 % (ref 0.0–5.0)
HCT: 37.8 % (ref 36.0–46.0)
Hemoglobin: 12.3 g/dL (ref 12.0–15.0)
Lymphocytes Relative: 33.4 % (ref 12.0–46.0)
Lymphs Abs: 2.4 10*3/uL (ref 0.7–4.0)
MCHC: 32.4 g/dL (ref 30.0–36.0)
MCV: 90.7 fl (ref 78.0–100.0)
Monocytes Absolute: 0.4 10*3/uL (ref 0.1–1.0)
Monocytes Relative: 6.1 % (ref 3.0–12.0)
Neutro Abs: 4.2 10*3/uL (ref 1.4–7.7)
Neutrophils Relative %: 58.1 % (ref 43.0–77.0)
Platelets: 318 10*3/uL (ref 150.0–400.0)
RBC: 4.17 Mil/uL (ref 3.87–5.11)
RDW: 14.3 % (ref 11.5–15.5)
WBC: 7.3 10*3/uL (ref 4.0–10.5)

## 2021-12-31 LAB — BASIC METABOLIC PANEL
BUN: 16 mg/dL (ref 6–23)
CO2: 32 mEq/L (ref 19–32)
Calcium: 9.1 mg/dL (ref 8.4–10.5)
Chloride: 101 mEq/L (ref 96–112)
Creatinine, Ser: 0.8 mg/dL (ref 0.40–1.20)
GFR: 68.98 mL/min (ref >60.00)
Glucose, Bld: 126 mg/dL — ABNORMAL HIGH (ref 70–99)
Potassium: 4.3 mEq/L (ref 3.5–5.1)
Sodium: 141 mEq/L (ref 135–145)

## 2021-12-31 LAB — HEPATIC FUNCTION PANEL
ALT: 23 U/L (ref 0–35)
AST: 18 U/L (ref 0–37)
Albumin: 4 g/dL (ref 3.5–5.2)
Alkaline Phosphatase: 69 U/L (ref 39–117)
Bilirubin, Direct: 0.1 mg/dL (ref 0.0–0.3)
Total Bilirubin: 0.5 mg/dL (ref 0.2–1.2)
Total Protein: 6 g/dL (ref 6.0–8.3)

## 2021-12-31 LAB — LIPID PANEL
Cholesterol: 247 mg/dL — ABNORMAL HIGH (ref 0–200)
HDL: 47.6 mg/dL (ref >39.00)
NonHDL: 199.51
Total CHOL/HDL Ratio: 5
Triglycerides: 272 mg/dL — ABNORMAL HIGH (ref 0.0–149.0)
VLDL: 54.4 mg/dL — ABNORMAL HIGH (ref 0.0–40.0)

## 2021-12-31 LAB — FERRITIN: Ferritin: 12.1 ng/mL (ref 10.0–291.0)

## 2021-12-31 LAB — HEMOGLOBIN A1C: Hgb A1c MFr Bld: 7.3 % — ABNORMAL HIGH (ref 4.6–6.5)

## 2021-12-31 LAB — LDL CHOLESTEROL, DIRECT: Direct LDL: 150 mg/dL

## 2022-01-01 ENCOUNTER — Other Ambulatory Visit: Payer: Self-pay

## 2022-01-01 ENCOUNTER — Telehealth: Payer: Self-pay

## 2022-01-01 ENCOUNTER — Telehealth: Payer: Self-pay | Admitting: Internal Medicine

## 2022-01-01 DIAGNOSIS — R300 Dysuria: Secondary | ICD-10-CM

## 2022-01-01 NOTE — Telephone Encounter (Signed)
If no emergent/acute symptoms, will wait for culture.  Let us know if any problems.

## 2022-01-01 NOTE — Telephone Encounter (Signed)
Lm for pt to cb re: results      Misty Jones has an upcoming appt with me.  Just need to confirm no urinary symptoms.  If any symptoms, let me know - will need to add urine culture to urine collected.  If no symptoms - urine ok.

## 2022-01-01 NOTE — Telephone Encounter (Signed)
Culture Add on Faxed over to lab

## 2022-01-01 NOTE — Telephone Encounter (Signed)
S/w pt - is having slight burning on urination.  Will have ucx added to urine  Message sent to [facility]

## 2022-01-01 NOTE — Telephone Encounter (Signed)
See if she can come in and give [place] more urine to do a urine culture.  Let me know if a problem

## 2022-01-01 NOTE — Telephone Encounter (Signed)
Pt will drop off urine tomorrow morning Placed on lab schedule. Ucx already ordered.

## 2022-01-01 NOTE — Telephone Encounter (Signed)
Patient returned Misty Jones, CMA's call.  Please call.

## 2022-01-01 NOTE — Telephone Encounter (Signed)
Pt coming tomorrow 8:15 to give urine Placed on lab sched. Ordered

## 2022-01-01 NOTE — Telephone Encounter (Signed)
-----   Message from [name], MD sent at 01/01/2022  5:08 AM EDT ----- Misty Jones has an upcoming appt with me.  Just need to confirm no urinary symptoms.  If any symptoms, let me know - will need to add urine culture to urine collected.  If no symptoms - urine ok.  Overall sugar control increased some from last check.  Will discuss her other labs with her at her upcoming appt.  Continue low carb diet and exercise.

## 2022-01-01 NOTE — Telephone Encounter (Signed)
[facility] called and stated they could not add a urine culture to the urine sent it was not enough.

## 2022-01-02 ENCOUNTER — Other Ambulatory Visit: Payer: Medicare Other

## 2022-01-02 DIAGNOSIS — R300 Dysuria: Secondary | ICD-10-CM

## 2022-01-05 ENCOUNTER — Ambulatory Visit (INDEPENDENT_AMBULATORY_CARE_PROVIDER_SITE_OTHER): Payer: Medicare Other | Admitting: Internal Medicine

## 2022-01-05 DIAGNOSIS — K219 Gastro-esophageal reflux disease without esophagitis: Secondary | ICD-10-CM

## 2022-01-05 DIAGNOSIS — E7800 Pure hypercholesterolemia, unspecified: Secondary | ICD-10-CM

## 2022-01-05 DIAGNOSIS — Z860100 Personal history of colon polyps, unspecified: Secondary | ICD-10-CM

## 2022-01-05 DIAGNOSIS — T466X5A Adverse effect of antihyperlipidemic and antiarteriosclerotic drugs, initial encounter: Secondary | ICD-10-CM | POA: Diagnosis not present

## 2022-01-05 DIAGNOSIS — R350 Frequency of micturition: Secondary | ICD-10-CM | POA: Diagnosis not present

## 2022-01-05 DIAGNOSIS — E1165 Type 2 diabetes mellitus with hyperglycemia: Secondary | ICD-10-CM | POA: Diagnosis not present

## 2022-01-05 DIAGNOSIS — I10 Essential (primary) hypertension: Secondary | ICD-10-CM | POA: Diagnosis not present

## 2022-01-05 DIAGNOSIS — I700 Atherosclerosis of aorta: Secondary | ICD-10-CM

## 2022-01-05 DIAGNOSIS — R351 Nocturia: Secondary | ICD-10-CM | POA: Diagnosis not present

## 2022-01-05 NOTE — Progress Notes (Signed)
Patient ID: Misty Jones, female   DOB: [birth year], 81 y.o.   MRN: [id number]   Subjective:    Patient ID: Misty Jones, female    DOB: 06-20-1941, 81 y.o.   MRN: [id number]   Patient here for a scheduled follow up.   Chief Complaint  Patient presents with   Follow-up    2 mo/ bladder issues   .   HPI Here to follow up regarding her diabetes, blood pressure and cholesterol.  Her main concern is persistent increased urinary frequency and nocturia.  Some back discomfort.  No chest pain or sob reported.  No nausea or vomiting.  No increased abdominal pain.  Bowels stable.  Appears to be handling stress relatively well.     Past Medical History:  Diagnosis Date   B12 deficiency    Chicken pox    Diabetes mellitus (HCC)    GERD (gastroesophageal reflux disease)    History of diverticulitis of colon    History of dysplastic nevus 07/05/2018   left mid back shv - with moderate atypia   Hypercholesterolemia    Hypertension    Nasal drainage    Past Surgical History:  Procedure Laterality Date   ABDOMINAL HYSTERECTOMY  1984   BACK SURGERY  1986   ruptured disc, Dr Hardin Negus [facility])   [place]   COLONOSCOPY WITH PROPOFOL N/A 06/29/2016   Procedure: COLONOSCOPY WITH PROPOFOL;  Surgeon: Manya Silvas, MD;  Location: [place];  Service: Endoscopy;  Laterality: N/A;   ESOPHAGOGASTRODUODENOSCOPY (EGD) WITH PROPOFOL N/A 12/13/2017   Procedure: ESOPHAGOGASTRODUODENOSCOPY (EGD) WITH PROPOFOL;  Surgeon: Manya Silvas, MD;  Location: [facility] ENDOSCOPY;  Service: Endoscopy;  Laterality: N/A;   TONSILLECTOMY     Family History  Problem Relation Age of Onset   Hypertension Mother    Cancer Mother        Mouth cancer   Heart disease Father        myocardial infarction   Raynaud syndrome Sister    Asthma Sister    Congenital heart disease Sister    Thyroid disease Sister    Thyroid cancer Daughter    Breast cancer Neg Hx    Colon  cancer Neg Hx    Social History   Socioeconomic History   Marital status: Divorced    Spouse name: Not on file   Number of children: 2   Years of education: Not on file   Highest education level: Not on file  Occupational History   Occupation: retired  Tobacco Use   Smoking status: Former    Packs/day: 0.50    Years: 20.00    Total pack years: 10.00    Types: Cigarettes    Quit date: 07/26/1984    Years since quitting: 37.4   Smokeless tobacco: Never   Tobacco comments:    quit smoking years ago  Vaping Use   Vaping Use: Never used  Substance and Sexual Activity   Alcohol use: No    Alcohol/week: 0.0 standard drinks of alcohol   Drug use: No   Sexual activity: Never  Other Topics Concern   Not on file  Social History Narrative   Not on file   Social Determinants of Health   Financial Resource Strain: Low Risk  (02/26/2021)   Overall Financial Resource Strain (CARDIA)    Difficulty of Paying Living Expenses: Not hard at all  Recent Concern: Financial Resource Strain - Medium Risk (01/30/2021)   Overall  Financial Resource Strain (CARDIA)    Difficulty of Paying Living Expenses: Somewhat hard  Food Insecurity: No Food Insecurity (01/21/2021)   Hunger Vital Sign    Worried About Running Out of Food in the Last Year: Never true    Ran Out of Food in the Last Year: Never true  Transportation Needs: No Transportation Needs (01/21/2021)   PRAPARE - Hydrologist (Medical): No    Lack of Transportation (Non-Medical): No  Physical Activity: Not on file  Stress: No Stress Concern Present (01/21/2021)   [place]    Feeling of Stress : Not at all  Social Connections: Unknown (01/21/2021)   Social Connection and Isolation Panel [NHANES]    Frequency of Communication with Friends and Family: More than three times a week    Frequency of Social Gatherings with Friends and Family: Not on  file    Attends Religious Services: Not on file    Active Member of Clubs or Organizations: Not on file    Attends Archivist Meetings: Not on file    Marital Status: Not on file     Review of Systems  Constitutional:  Negative for appetite change and unexpected weight change.  HENT:  Negative for congestion and sinus pressure.   Respiratory:  Negative for cough, chest tightness and shortness of breath.   Cardiovascular:  Negative for chest pain, palpitations and leg swelling.  Gastrointestinal:  Negative for nausea and vomiting.  Genitourinary:  Positive for frequency. Negative for difficulty urinating.  Musculoskeletal:  Negative for joint swelling and myalgias.       Some back pain as outlined.   Skin:  Negative for color change and rash.  Neurological:  Negative for dizziness, light-headedness and headaches.  Psychiatric/Behavioral:  Negative for agitation and dysphoric mood.        Objective:     BP 130/80 (BP Location: Left Arm, Patient Position: Sitting, Cuff Size: Normal)   Pulse 84   Temp 98.5 F (36.9 C) (Oral)   Ht 5' 4" (1.626 m)   Wt 207 lb (93.9 kg)   SpO2 98%   BMI 35.53 kg/m  Wt Readings from Last 3 Encounters:  01/05/22 207 lb (93.9 kg)  12/12/21 207 lb (93.9 kg)  11/04/21 209 lb 3.2 oz (94.9 kg)    Physical Exam Vitals reviewed.  Constitutional:      General: She is not in acute distress.    Appearance: Normal appearance.  HENT:     Head: Normocephalic and atraumatic.     Right Ear: External ear normal.     Left Ear: External ear normal.  Eyes:     General: No scleral icterus.       Right eye: No discharge.        Left eye: No discharge.     Conjunctiva/sclera: Conjunctivae normal.  Neck:     Thyroid: No thyromegaly.  Cardiovascular:     Rate and Rhythm: Normal rate and regular rhythm.  Pulmonary:     Effort: No respiratory distress.     Breath sounds: Normal breath sounds. No wheezing.  Abdominal:     General: Bowel sounds  are normal.     Palpations: Abdomen is soft.     Tenderness: There is no abdominal tenderness.  Musculoskeletal:        General: No swelling or tenderness.     Cervical back: Neck supple. No tenderness.  Lymphadenopathy:  Cervical: No cervical adenopathy.  Skin:    Findings: No erythema or rash.  Neurological:     Mental Status: She is alert.  Psychiatric:        Mood and Affect: Mood normal.        Behavior: Behavior normal.      Outpatient Encounter Medications as of 01/05/2022  Medication Sig   aspirin 81 MG tablet Take 81 mg by mouth daily.   benazepril (LOTENSIN) 40 MG tablet Take 1 tablet by mouth once daily   cyanocobalamin (,VITAMIN B-12,) 1000 MCG/ML injection INJECT 1 ML INTRAMUSCULARLY ONCE EVERY MONTH   glucose blood (CONTOUR TEST) test strip USE 1 STRIP TO CHECK GLUCOSE ONCE DAILY.   hydrochlorothiazide (HYDRODIURIL) 25 MG tablet Take 1/2 (one-half) tablet by mouth once daily   Lancets MISC Check sugars once a day Dx: E11.9 (Contour Next)   metFORMIN (GLUCOPHAGE) 500 MG tablet Take 1 tablet by mouth 2 (two) times daily with a meal.   metFORMIN (GLUCOPHAGE-XR) 500 MG 24 hr tablet Take 1 tablet by mouth twice daily   Multiple Vitamins-Minerals (PRESERVISION AREDS 2 PO) Take by mouth.   niacin 500 MG tablet Take 500 mg by mouth at bedtime.   nystatin cream (MYCOSTATIN) Apply 1 application. topically 2 (two) times daily.   omeprazole (PRILOSEC) 20 MG capsule Take 20 mg by mouth daily.   Syringe/Needle, Disp, (SYRINGE 3CC/25GX1") 25G X 1" 3 ML MISC Use as directed to administer b12 injections.   tacrolimus (PROTOPIC) 0.1 % ointment SMARTSIG:sparingly Topical Twice Daily PRN   No facility-administered encounter medications on file as of 01/05/2022.     Lab Results  Component Value Date   WBC 7.3 12/31/2021   HGB 12.3 12/31/2021   HCT 37.8 12/31/2021   PLT 318.0 12/31/2021   GLUCOSE 126 (H) 12/31/2021   CHOL 247 (H) 12/31/2021   TRIG 272.0 (H) 12/31/2021   HDL  47.60 12/31/2021   LDLDIRECT 150.0 12/31/2021   LDLCALC 48 01/15/2021   ALT 23 12/31/2021   AST 18 12/31/2021   NA 141 12/31/2021   K 4.3 12/31/2021   CL 101 12/31/2021   CREATININE 0.80 12/31/2021   BUN 16 12/31/2021   CO2 32 12/31/2021   TSH 3.54 04/30/2021   INR 0.9 04/03/2012   HGBA1C 7.3 (H) 12/31/2021   MICROALBUR <0.7 04/30/2021    MM 3D SCREEN BREAST BILATERAL  Result Date: 05/21/2021 CLINICAL DATA:  Screening. EXAM: DIGITAL SCREENING BILATERAL MAMMOGRAM WITH TOMOSYNTHESIS AND CAD TECHNIQUE: Bilateral screening digital craniocaudal and mediolateral oblique mammograms were obtained. Bilateral screening digital breast tomosynthesis was performed. The images were evaluated with computer-aided detection. COMPARISON:  Previous exam(s). ACR Breast Density Category b: There are scattered areas of fibroglandular density. FINDINGS: There are no findings suspicious for malignancy. IMPRESSION: No mammographic evidence of malignancy. A result letter of this screening mammogram will be mailed directly to the patient. RECOMMENDATION: Screening mammogram in one year. (Code:SM-B-01Y) BI-RADS CATEGORY  1: Negative. Electronically Signed   By: Dorise Bullion III M.D.   On: 05/21/2021 17:46      Assessment & Plan:   Problem List Items Addressed This Visit     Adverse effect of statin    Ad intolerance to statin medication and zetia.       Aortic atherosclerosis (HCC)    Intolerant to statins and zetia.  Off repatha.  Low cholesterol diet and exercise.  Follow lipid panel and liver function tests.   Lab Results  Component Value Date   CHOL  247 (H) 12/31/2021   HDL 47.60 12/31/2021   LDLCALC 48 01/15/2021   LDLDIRECT 150.0 12/31/2021   TRIG 272.0 (H) 12/31/2021   CHOLHDL 5 12/31/2021        Diabetes mellitus ([place])    Continues on metformin.  Low carb diet and exercise.  Follow met b and a1c.        Relevant Orders   Hemoglobin X5M   Basic metabolic panel   Microalbumin /  creatinine urine ratio   GERD (gastroesophageal reflux disease)    Continue prilosec.  No chest pain or increased symptoms reported.  Follow.       History of colonic polyps    Colonoscopy 12.2017 - one polyp in ascending colon and two rectal polyps (tubular adenomas).  recommended f/u in 5 years.  - due f/u.       Hypercholesterolemia    Intolerant to statins, zetia and repatha.  On no medication now.  Low cholesterol diet and exercise.  Follow lipid panel.       Relevant Orders   Hepatic function panel   TSH   Lipid panel   Hypertension    Blood pressure has been doing well.  Continue benazepril and hctz.  Follow pressures.  Follow metabolic panel.       Nocturia    Persistent nocturia and increased frequency.  Discussed possible etiologies, including, overactive bladder, sleep apnea, etc.  Sugar ok.  Given persistent symptoms, refer to urology for further evaluation.       Relevant Orders   Ambulatory referral to Urology   Urinary frequency    Persistent symptoms.  Discussed further w/up and evaluation.  Discussed if emptying bladder fully, etc.  Request referral to urology for further evaluation and treatment.        Relevant Orders   Ambulatory referral to Urology     [name], MD

## 2022-01-07 LAB — SUSCEPTIBILITY PANEL,AEROBIC BACTERIUM
Micro Number:: 13513040
Specimen Quality:: ADEQUATE

## 2022-01-07 LAB — TEST AUTHORIZATION

## 2022-01-07 LAB — URINE CULTURE
MICRO NUMBER:: 13506516
SPECIMEN QUALITY:: ADEQUATE

## 2022-01-08 ENCOUNTER — Other Ambulatory Visit: Payer: Self-pay

## 2022-01-08 MED ORDER — CEFDINIR 300 MG PO CAPS: 300.0000 mg | ORAL_CAPSULE | Freq: Two times a day (BID) | ORAL | 0 refills | Status: DC

## 2022-01-15 ENCOUNTER — Encounter: Payer: Self-pay | Admitting: Internal Medicine

## 2022-01-15 DIAGNOSIS — R350 Frequency of micturition: Secondary | ICD-10-CM | POA: Insufficient documentation

## 2022-01-15 NOTE — Assessment & Plan Note (Signed)
Continues on metformin.  Low carb diet and exercise.  Follow met b and a1c.

## 2022-01-15 NOTE — Assessment & Plan Note (Signed)
Blood pressure has been doing well.  Continue benazepril and hctz.  Follow pressures.  Follow metabolic panel.  

## 2022-01-15 NOTE — Assessment & Plan Note (Addendum)
Persistent nocturia and increased frequency.  Discussed possible etiologies, including, overactive bladder, sleep apnea, etc.  Sugar ok.  Given persistent symptoms, refer to urology for further evaluation.

## 2022-01-15 NOTE — Assessment & Plan Note (Signed)
Continue prilosec.  No chest pain or increased symptoms reported.  Follow.  

## 2022-01-15 NOTE — Assessment & Plan Note (Signed)
Intolerant to statins, zetia and repatha.  On no medication now.  Low cholesterol diet and exercise.  Follow lipid panel.

## 2022-01-15 NOTE — Assessment & Plan Note (Signed)
Ad intolerance to statin medication and zetia.

## 2022-01-15 NOTE — Assessment & Plan Note (Signed)
Colonoscopy 12.2017 - one polyp in ascending colon and two rectal polyps (tubular adenomas).  recommended f/u in 5 years.  - due f/u.  

## 2022-01-15 NOTE — Assessment & Plan Note (Signed)
Intolerant to statins and zetia.  Off repatha.  Low cholesterol diet and exercise.  Follow lipid panel and liver function tests.   Lab Results  Component Value Date   CHOL 247 (H) 12/31/2021   HDL 47.60 12/31/2021   LDLCALC 48 01/15/2021   LDLDIRECT 150.0 12/31/2021   TRIG 272.0 (H) 12/31/2021   CHOLHDL 5 12/31/2021

## 2022-01-22 ENCOUNTER — Ambulatory Visit: Payer: Medicare Other

## 2022-02-10 ENCOUNTER — Encounter: Payer: Self-pay | Admitting: Internal Medicine

## 2022-02-11 ENCOUNTER — Encounter: Payer: Self-pay | Admitting: Internal Medicine

## 2022-02-11 ENCOUNTER — Ambulatory Visit (INDEPENDENT_AMBULATORY_CARE_PROVIDER_SITE_OTHER): Payer: Medicare Other | Admitting: Podiatry

## 2022-02-11 ENCOUNTER — Encounter: Payer: Self-pay | Admitting: Podiatry

## 2022-02-11 DIAGNOSIS — M778 Other enthesopathies, not elsewhere classified: Secondary | ICD-10-CM | POA: Diagnosis not present

## 2022-02-11 DIAGNOSIS — M779 Enthesopathy, unspecified: Secondary | ICD-10-CM | POA: Insufficient documentation

## 2022-02-11 MED ORDER — TRIAMCINOLONE ACETONIDE 40 MG/ML IJ SUSP
20.0000 mg | Freq: Once | INTRAMUSCULAR | Status: AC
  Administered 2022-02-11: 20 mg

## 2022-02-11 NOTE — Progress Notes (Signed)
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 Its so bad at night time. The left one is getting to be the same way."    81 y.o. female presents with the above complaint.   ROS: Denies fever chills nausea vomiting muscle aches pains calf pain back pain chest pain shortness of breath.  Past Medical History:  Diagnosis Date   B12 deficiency    Chicken pox    Diabetes mellitus (HCC)    GERD (gastroesophageal reflux disease)    History of diverticulitis of colon    History of dysplastic nevus 07/05/2018   left mid back shv - with moderate atypia   Hypercholesterolemia    Hypertension    Nasal drainage    Past Surgical History:  Procedure Laterality Date   ABDOMINAL HYSTERECTOMY  1984   BACK SURGERY  1986   ruptured disc, Dr Hardin Negus [facility])   [place]   COLONOSCOPY WITH PROPOFOL N/A 06/29/2016   Procedure: COLONOSCOPY WITH PROPOFOL;  Surgeon: Manya Silvas, MD;  Location: [facility];  Service: Endoscopy;  Laterality: N/A;   ESOPHAGOGASTRODUODENOSCOPY (EGD) WITH PROPOFOL N/A 12/13/2017   Procedure: ESOPHAGOGASTRODUODENOSCOPY (EGD) WITH PROPOFOL;  Surgeon: Manya Silvas, MD;  Location: [facility] ENDOSCOPY;  Service: Endoscopy;  Laterality: N/A;   TONSILLECTOMY      Current Outpatient Medications:    aspirin 81 MG tablet, Take 81 mg by mouth daily., Disp: , Rfl:    benazepril (LOTENSIN) 40 MG tablet, Take 1 tablet by mouth once daily, Disp: 90 tablet, Rfl: 0   cefdinir (OMNICEF) 300 MG capsule, Take 1 capsule (300 mg total) by mouth 2 (two) times daily., Disp: 10 capsule, Rfl: 0   cyanocobalamin (,VITAMIN B-12,) 1000 MCG/ML injection, INJECT 1 ML INTRAMUSCULARLY ONCE EVERY MONTH, Disp: 30 mL, Rfl: 0   glucose blood (CONTOUR TEST) test strip, USE 1 STRIP  TO CHECK GLUCOSE ONCE DAILY., Disp: 50 each, Rfl: 5   hydrochlorothiazide (HYDRODIURIL) 25 MG tablet, Take 1/2 (one-half) tablet by mouth once daily, Disp: 90 tablet, Rfl: 1   Lancets MISC, Check sugars once a day Dx: E11.9 (Contour Next), Disp: 100 each, Rfl: 3   metFORMIN (GLUCOPHAGE) 500 MG tablet, Take 1 tablet by mouth 2 (two) times daily with a meal., Disp: , Rfl:    metFORMIN (GLUCOPHAGE-XR) 500 MG 24 hr tablet, Take 1 tablet by mouth twice daily, Disp: 180 tablet, Rfl: 0   Multiple Vitamins-Minerals (PRESERVISION AREDS 2 PO), Take by mouth., Disp: , Rfl:    niacin 500 MG tablet, Take 500 mg by mouth at bedtime., Disp: , Rfl:    nystatin cream (MYCOSTATIN), Apply 1 application. topically 2 (two) times daily., Disp: 30 g, Rfl: 0   omeprazole (PRILOSEC) 20 MG capsule, Take 20 mg by mouth daily., Disp: , Rfl:    Syringe/Needle, Disp, (SYRINGE 3CC/25GX1") 25G X 1" 3 ML MISC, Use as directed to administer b12 injections., Disp: 50 each, Rfl: 1   tacrolimus (PROTOPIC) 0.1 % ointment, SMARTSIG:sparingly Topical Twice Daily PRN, Disp: , Rfl:   Allergies  Allergen Reactions   Augmentin [Amoxicillin-Pot Clavulanate] Other (See Comments)    vomiting   Protonix [Pantoprazole Sodium]    Review of Systems Objective:  There were no vitals filed for this visit.  General: Well developed, nourished, in no acute distress, alert and oriented x3   Dermatological: Skin is warm, dry and supple bilateral. Nails x 10 are well maintained; remaining integument appears unremarkable at this time. There are no open sores, no preulcerative lesions, no rash or signs of infection present.  Vascular: Dorsalis Pedis artery and Posterior Tibial artery pedal pulses are 2/4 bilateral with immedate capillary fill time. Pedal hair growth present. No varicosities and no lower extremity edema present bilateral.   Neruologic: Grossly intact via light touch bilateral. Vibratory intact via tuning fork bilateral. Protective  threshold with Semmes Wienstein monofilament intact to all pedal sites bilateral. Patellar and Achilles deep tendon reflexes 2+ bilateral. No Babinski or clonus noted bilateral.   Musculoskeletal: No gross boney pedal deformities bilateral. No pain, crepitus, or limitation noted with foot and ankle range of motion bilateral. Muscular strength 5/5 in all groups tested bilateral.  Pain across the dorsal aspect of the foot and frontal plane range of motion particularly the right first tarsometatarsal joint area.  Gait: Unassisted, Nonantalgic.    Radiographs:  None taken  Assessment & Plan:   Assessment: Capsulitis dorsal exostosis neuritis right foot tarsometatarsal joints  Plan: Injected the area today 20 mg Kenalog 5 mg Marcaine dorsal aspect right foot.     Laurisa Sahakian T. [place], [place]

## 2022-02-25 DIAGNOSIS — L710 Perioral dermatitis: Secondary | ICD-10-CM | POA: Diagnosis not present

## 2022-02-25 DIAGNOSIS — B001 Herpesviral vesicular dermatitis: Secondary | ICD-10-CM | POA: Diagnosis not present

## 2022-03-02 ENCOUNTER — Encounter: Payer: Self-pay | Admitting: Internal Medicine

## 2022-03-11 ENCOUNTER — Encounter: Payer: Self-pay | Admitting: Internal Medicine

## 2022-03-13 ENCOUNTER — Encounter: Payer: Self-pay | Admitting: Internal Medicine

## 2022-03-13 NOTE — Telephone Encounter (Signed)
I called and spoke with Misty Jones from [facility] to get more clarification on this issue. She stated that there was a Medicare utilization form that needed to be filled out & faxed back to Medicare. It is to determine if strips are needed for non-insulin dependent patient's. They are faxing another one to my attention just in case for some reason the last one was not received. Will BOLO for this & needs to be faxed back to number in the middle of the page she stated. Misty Jones also told me to have patient come by & she would bridge the gap with some strips to give patient.   I called patient to inform her of this & she stated that she would go to Misty Jones tomorrow bc she could not make it today.

## 2022-04-01 ENCOUNTER — Other Ambulatory Visit: Payer: Medicare Other

## 2022-04-01 ENCOUNTER — Encounter: Payer: Self-pay | Admitting: Internal Medicine

## 2022-04-01 ENCOUNTER — Other Ambulatory Visit (INDEPENDENT_AMBULATORY_CARE_PROVIDER_SITE_OTHER): Payer: Medicare Other

## 2022-04-01 DIAGNOSIS — E1165 Type 2 diabetes mellitus with hyperglycemia: Secondary | ICD-10-CM | POA: Diagnosis not present

## 2022-04-01 DIAGNOSIS — R300 Dysuria: Secondary | ICD-10-CM

## 2022-04-01 DIAGNOSIS — H353124 Nonexudative age-related macular degeneration, left eye, advanced atrophic with subfoveal involvement: Secondary | ICD-10-CM | POA: Diagnosis not present

## 2022-04-01 LAB — MICROALBUMIN / CREATININE URINE RATIO
Creatinine,U: 63.4 mg/dL
Microalb Creat Ratio: 1.1 mg/g (ref 0.0–30.0)
Microalb, Ur: <0.7 mg/dL (ref 0.0–1.9)

## 2022-04-01 LAB — URINALYSIS
Bilirubin Urine: NEGATIVE
Hgb urine dipstick: NEGATIVE
Ketones, ur: NEGATIVE
Leukocytes,Ua: NEGATIVE
Nitrite: NEGATIVE
Specific Gravity, Urine: 1.015 (ref 1.000–1.030)
Total Protein, Urine: NEGATIVE
Urine Glucose: NEGATIVE
Urobilinogen, UA: 0.2 (ref 0.0–1.0)
pH: 5.5 (ref 5.0–8.0)

## 2022-04-01 NOTE — Telephone Encounter (Signed)
She can leave a urine specimen and do a virtual visit with me to discuss results and treatment.

## 2022-04-01 NOTE — Addendum Note (Signed)
Addended by: Leeanne Rio on: 04/01/2022 04:21 PM   Modules accepted: Orders

## 2022-04-02 ENCOUNTER — Telehealth: Payer: Medicare Other | Admitting: Internal Medicine

## 2022-04-02 ENCOUNTER — Encounter: Payer: Self-pay | Admitting: Internal Medicine

## 2022-04-02 ENCOUNTER — Telehealth (INDEPENDENT_AMBULATORY_CARE_PROVIDER_SITE_OTHER): Payer: Medicare Other | Admitting: Internal Medicine

## 2022-04-02 DIAGNOSIS — N898 Other specified noninflammatory disorders of vagina: Secondary | ICD-10-CM | POA: Diagnosis not present

## 2022-04-02 DIAGNOSIS — E1165 Type 2 diabetes mellitus with hyperglycemia: Secondary | ICD-10-CM | POA: Diagnosis not present

## 2022-04-02 LAB — URINE CULTURE
MICRO NUMBER:: 13879150
SPECIMEN QUALITY:: ADEQUATE

## 2022-04-02 NOTE — Progress Notes (Signed)
Metformin causing  severe diarrhea patient is taking 1 per day

## 2022-04-02 NOTE — Progress Notes (Deleted)
Patient ID: Misty Jones, female   DOB: [birth year], 81 y.o.   MRN: [id number]   Subjective:    Patient ID: Misty Jones, female    DOB: 1941/05/15, 81 y.o.   MRN: [id number]  This visit occurred during the SARS-CoV-2 public health emergency.  Safety protocols were in place, including screening questions prior to the visit, additional usage of staff PPE, and extensive cleaning of exam room while observing appropriate contact time as indicated for disinfecting solutions.   Patient here for No chief complaint on file.  Marland Kitchen   HPI    Past Medical History:  Diagnosis Date   B12 deficiency    Chicken pox    Diabetes mellitus ([place])    GERD (gastroesophageal reflux disease)    History of diverticulitis of colon    History of dysplastic nevus 07/05/2018   left mid back shv - with moderate atypia   Hypercholesterolemia    Hypertension    Nasal drainage    Past Surgical History:  Procedure Laterality Date   ABDOMINAL HYSTERECTOMY  [place]   ruptured disc, Dr Hardin Negus [facility])   [place]   COLONOSCOPY WITH PROPOFOL N/A 06/29/2016   Procedure: COLONOSCOPY WITH PROPOFOL;  Surgeon: Manya Silvas, MD;  Location: [facility];  Service: Endoscopy;  Laterality: N/A;   ESOPHAGOGASTRODUODENOSCOPY (EGD) WITH PROPOFOL N/A 12/13/2017   Procedure: ESOPHAGOGASTRODUODENOSCOPY (EGD) WITH PROPOFOL;  Surgeon: Manya Silvas, MD;  Location: [facility] ENDOSCOPY;  Service: Endoscopy;  Laterality: N/A;   TONSILLECTOMY     Family History  Problem Relation Age of Onset   Hypertension Mother    Cancer Mother        Mouth cancer   Heart disease Father        myocardial infarction   Raynaud syndrome Sister    Asthma Sister    Congenital heart disease Sister    Thyroid disease Sister    Thyroid cancer Daughter    Breast cancer Neg Hx    Colon cancer Neg Hx    Social History   Socioeconomic History   Marital status: Divorced    Spouse  name: Not on file   Number of children: 2   Years of education: Not on file   Highest education level: Not on file  Occupational History   Occupation: retired  Tobacco Use   Smoking status: Former    Packs/day: 0.50    Years: 20.00    Total pack years: 10.00    Types: Cigarettes    Quit date: 07/26/1984    Years since quitting: 37.7   Smokeless tobacco: Never   Tobacco comments:    quit smoking years ago  Vaping Use   Vaping Use: Never used  Substance and Sexual Activity   Alcohol use: No    Alcohol/week: 0.0 standard drinks of alcohol   Drug use: No   Sexual activity: Never  Other Topics Concern   Not on file  Social History Narrative   Not on file   Social Determinants of Health   Financial Resource Strain: Low Risk  (02/26/2021)   Overall Financial Resource Strain (CARDIA)    Difficulty of Paying Living Expenses: Not hard at all  Recent Concern: Financial Resource Strain - Medium Risk (01/30/2021)   Overall Financial Resource Strain (CARDIA)    Difficulty of Paying Living Expenses: Somewhat hard  Food Insecurity: No Food Insecurity (01/21/2021)   Hunger Vital Sign  Worried About Charity fundraiser in the Last Year: Never true    Candor in the Last Year: Never true  Transportation Needs: No Transportation Needs (01/21/2021)   PRAPARE - Hydrologist (Medical): No    Lack of Transportation (Non-Medical): No  Physical Activity: Not on file  Stress: No Stress Concern Present (01/21/2021)   [place]    Feeling of Stress : Not at all  Social Connections: Unknown (01/21/2021)   Social Connection and Isolation Panel [NHANES]    Frequency of Communication with Friends and Family: More than three times a week    Frequency of Social Gatherings with Friends and Family: Not on file    Attends Religious Services: Not on file    Active Member of [employer] or Organizations: Not on  file    Attends Archivist Meetings: Not on file    Marital Status: Not on file     Review of Systems     Objective:     Ht '5\' 4"'$  (1.626 m)   Wt 207 lb 1.6 oz (93.9 kg)   BMI 35.55 kg/m  Wt Readings from Last 3 Encounters:  04/02/22 207 lb 1.6 oz (93.9 kg)  01/05/22 207 lb (93.9 kg)  12/12/21 207 lb (93.9 kg)    Physical Exam   Outpatient Encounter Medications as of 04/02/2022  Medication Sig   aspirin 81 MG tablet Take 81 mg by mouth daily.   benazepril (LOTENSIN) 40 MG tablet Take 1 tablet by mouth once daily   cyanocobalamin (,VITAMIN B-12,) 1000 MCG/ML injection INJECT 1 ML INTRAMUSCULARLY ONCE EVERY MONTH   glucose blood (CONTOUR TEST) test strip USE 1 STRIP TO CHECK GLUCOSE ONCE DAILY.   hydrochlorothiazide (HYDRODIURIL) 25 MG tablet Take 1/2 (one-half) tablet by mouth once daily   Lancets MISC Check sugars once a day Dx: E11.9 (Contour Next)   Multiple Vitamins-Minerals (PRESERVISION AREDS 2 PO) Take by mouth.   niacin 500 MG tablet Take 500 mg by mouth at bedtime.   omeprazole (PRILOSEC) 20 MG capsule Take 20 mg by mouth daily.   Syringe/Needle, Disp, (SYRINGE 3CC/25GX1") 25G X 1" 3 ML MISC Use as directed to administer b12 injections.   cefdinir (OMNICEF) 300 MG capsule Take 1 capsule (300 mg total) by mouth 2 (two) times daily. (Patient not taking: Reported on 04/02/2022)   metFORMIN (GLUCOPHAGE) 500 MG tablet Take 1 tablet by mouth 2 (two) times daily with a meal. (Patient not taking: Reported on 04/02/2022)   metFORMIN (GLUCOPHAGE-XR) 500 MG 24 hr tablet Take 1 tablet by mouth twice daily (Patient not taking: Reported on 04/02/2022)   nystatin cream (MYCOSTATIN) Apply 1 application. topically 2 (two) times daily. (Patient not taking: Reported on 04/02/2022)   tacrolimus (PROTOPIC) 0.1 % ointment SMARTSIG:sparingly Topical Twice Daily PRN (Patient not taking: Reported on 04/02/2022)   No facility-administered encounter medications on file as of 04/02/2022.     Lab  Results  Component Value Date   WBC 7.3 12/31/2021   HGB 12.3 12/31/2021   HCT 37.8 12/31/2021   PLT 318.0 12/31/2021   GLUCOSE 126 (H) 12/31/2021   CHOL 247 (H) 12/31/2021   TRIG 272.0 (H) 12/31/2021   HDL 47.60 12/31/2021   LDLDIRECT 150.0 12/31/2021   LDLCALC 48 01/15/2021   ALT 23 12/31/2021   AST 18 12/31/2021   NA 141 12/31/2021   K 4.3 12/31/2021   CL 101 12/31/2021  CREATININE 0.80 12/31/2021   BUN 16 12/31/2021   CO2 32 12/31/2021   TSH 3.54 04/30/2021   INR 0.9 04/03/2012   HGBA1C 7.3 (H) 12/31/2021   MICROALBUR <0.7 04/01/2022    MM 3D SCREEN BREAST BILATERAL  Result Date: 05/21/2021 CLINICAL DATA:  Screening. EXAM: DIGITAL SCREENING BILATERAL MAMMOGRAM WITH TOMOSYNTHESIS AND CAD TECHNIQUE: Bilateral screening digital craniocaudal and mediolateral oblique mammograms were obtained. Bilateral screening digital breast tomosynthesis was performed. The images were evaluated with computer-aided detection. COMPARISON:  Previous exam(s). ACR Breast Density Category b: There are scattered areas of fibroglandular density. FINDINGS: There are no findings suspicious for malignancy. IMPRESSION: No mammographic evidence of malignancy. A result letter of this screening mammogram will be mailed directly to the patient. RECOMMENDATION: Screening mammogram in one year. (Code:SM-B-01Y) BI-RADS CATEGORY  1: Negative. Electronically Signed   By: Dorise Bullion III M.D.   On: 05/21/2021 17:46      Assessment & Plan:   Problem List Items Addressed This Visit   None    [name], MD

## 2022-04-06 ENCOUNTER — Ambulatory Visit (INDEPENDENT_AMBULATORY_CARE_PROVIDER_SITE_OTHER): Payer: Medicare Other | Admitting: Urology

## 2022-04-06 ENCOUNTER — Encounter: Payer: Self-pay | Admitting: Urology

## 2022-04-06 VITALS — BP 146/91 | HR 111 | Ht 64.0 in | Wt 207.0 lb

## 2022-04-06 DIAGNOSIS — R350 Frequency of micturition: Secondary | ICD-10-CM

## 2022-04-06 LAB — URINALYSIS, COMPLETE
Bilirubin, UA: NEGATIVE
Glucose, UA: NEGATIVE
Ketones, UA: NEGATIVE
Nitrite, UA: NEGATIVE
Protein,UA: NEGATIVE
RBC, UA: NEGATIVE
Specific Gravity, UA: 1.02 (ref 1.005–1.030)
Urobilinogen, Ur: 0.2 mg/dL (ref 0.2–1.0)
pH, UA: 5 (ref 5.0–7.5)

## 2022-04-06 LAB — MICROSCOPIC EXAMINATION

## 2022-04-06 LAB — BLADDER SCAN AMB NON-IMAGING: Scan Result: 12

## 2022-04-06 MED ORDER — MIRABEGRON ER 50 MG PO TB24: 50.0000 mg | ORAL_TABLET | Freq: Every day | ORAL | 11 refills | Status: DC

## 2022-04-06 NOTE — Progress Notes (Signed)
04/06/2022 12:56 PM   Misty Jones Dec 17, 1940 [id number]  Referring provider: [name], [address] [place],  [place] [address]  No chief complaint on file.   HPI: I was consulted to assess the patient's nighttime frequency and get up 10-15 times at night.  She voids every 30 minutes during the day and does not think she could hold it for 2 hours.  She voids frequently due to urgency.  She wears a pad for continence but does not leak  She has a dribbling flow.  She does positional changes and stands or squats to empty better at the end.  Flow is steady.  She does feel empty  She has had low back surgery and a hysterectomy and may have tried 1 pill many years ago.  She is on oral hypoglycemics  She had one bladder infection this year  No kidney stones or bladder surgery   PMH: Past Medical History:  Diagnosis Date   B12 deficiency    Chicken pox    Diabetes mellitus (HCC)    GERD (gastroesophageal reflux disease)    History of diverticulitis of colon    History of dysplastic nevus 07/05/2018   left mid back shv - with moderate atypia   Hypercholesterolemia    Hypertension    Nasal drainage     Surgical History: Past Surgical History:  Procedure Laterality Date   ABDOMINAL HYSTERECTOMY  [place]   ruptured disc, Dr Hardin Negus [facility])   [place]   COLONOSCOPY WITH PROPOFOL N/A 06/29/2016   Procedure: COLONOSCOPY WITH PROPOFOL;  Surgeon: Manya Silvas, MD;  Location: [facility];  Service: Endoscopy;  Laterality: N/A;   ESOPHAGOGASTRODUODENOSCOPY (EGD) WITH PROPOFOL N/A 12/13/2017   Procedure: ESOPHAGOGASTRODUODENOSCOPY (EGD) WITH PROPOFOL;  Surgeon: Manya Silvas, MD;  Location: [facility] ENDOSCOPY;  Service: Endoscopy;  Laterality: N/A;   TONSILLECTOMY      Home Medications:  Allergies as of 04/06/2022       Reactions   Augmentin [amoxicillin-pot Clavulanate] Other  (See Comments)   vomiting   Protonix [pantoprazole Sodium]         Medication List        Accurate as of April 06, 2022 12:56 PM. If you have any questions, ask your nurse or doctor.          aspirin 81 MG tablet Take 81 mg by mouth daily.   benazepril 40 MG tablet Commonly known as: LOTENSIN Take 1 tablet by mouth once daily   cefdinir 300 MG capsule Commonly known as: OMNICEF Take 1 capsule (300 mg total) by mouth 2 (two) times daily.   Contour Test test strip Generic drug: glucose blood USE 1 STRIP TO CHECK GLUCOSE ONCE DAILY.   cyanocobalamin 1000 MCG/ML injection Commonly known as: VITAMIN B12 INJECT 1 ML INTRAMUSCULARLY ONCE EVERY MONTH   hydrochlorothiazide 25 MG tablet Commonly known as: HYDRODIURIL Take 1/2 (one-half) tablet by mouth once daily   Lancets Misc Check sugars once a day Dx: E11.9 (Contour Next)   metFORMIN 500 MG tablet Commonly known as: GLUCOPHAGE Take 1 tablet by mouth 2 (two) times daily with a meal.   metFORMIN 500 MG 24 hr tablet Commonly known as: GLUCOPHAGE-XR Take 1 tablet by mouth twice daily   niacin 500 MG tablet Commonly known as: VITAMIN B3 Take 500 mg by mouth at bedtime.   nystatin cream Commonly known as: MYCOSTATIN Apply 1 application.  topically 2 (two) times daily.   omeprazole 20 MG capsule Commonly known as: PRILOSEC Take 20 mg by mouth daily.   PRESERVISION AREDS 2 PO Take by mouth.   SYRINGE 3CC/25GX1" 25G X 1" 3 ML Misc Use as directed to administer b12 injections.   tacrolimus 0.1 % ointment Commonly known as: PROTOPIC SMARTSIG:sparingly Topical Twice Daily PRN        Allergies:  Allergies  Allergen Reactions   Augmentin [Amoxicillin-Pot Clavulanate] Other (See Comments)    vomiting   Protonix [Pantoprazole Sodium]     Family History: Family History  Problem Relation Age of Onset   Hypertension Mother    Cancer Mother        Mouth cancer   Heart disease Father         myocardial infarction   Raynaud syndrome Sister    Asthma Sister    Congenital heart disease Sister    Thyroid disease Sister    Thyroid cancer Daughter    Breast cancer Neg Hx    Colon cancer Neg Hx     Social History:  reports that she quit smoking about 37 years ago. Her smoking use included cigarettes. She has a 10.00 pack-year smoking history. She has never used smokeless tobacco. She reports that she does not drink alcohol and does not use drugs.  ROS:                                        Physical Exam: There were no vitals taken for this visit.  Constitutional:  Alert and oriented, No acute distress. HEENT: [place] AT, moist mucus membranes.  Trachea midline, no masses.   Laboratory Data: Lab Results  Component Value Date   WBC 7.3 12/31/2021   HGB 12.3 12/31/2021   HCT 37.8 12/31/2021   MCV 90.7 12/31/2021   PLT 318.0 12/31/2021    Lab Results  Component Value Date   CREATININE 0.80 12/31/2021    No results found for: "PSA"  No results found for: "TESTOSTERONE"  Lab Results  Component Value Date   HGBA1C 7.3 (H) 12/31/2021    Urinalysis    Component Value Date/Time   COLORURINE YELLOW 04/01/2022 1706   APPEARANCEUR CLEAR 04/01/2022 1706   LABSPEC 1.015 04/01/2022 1706   PHURINE 5.5 04/01/2022 1706   GLUCOSEU NEGATIVE 04/01/2022 1706   HGBUR NEGATIVE 04/01/2022 1706   BILIRUBINUR NEGATIVE 04/01/2022 1706   BILIRUBINUR neg 12/12/2021 1139   KETONESUR NEGATIVE 04/01/2022 1706   PROTEINUR Negative 12/12/2021 1139   UROBILINOGEN 0.2 04/01/2022 1706   NITRITE NEGATIVE 04/01/2022 1706   LEUKOCYTESUR NEGATIVE 04/01/2022 1706    Pertinent Imaging: Urine reviewed.  Urine sent for culture.  Chart reviewed Postvoid residual 12 mL  Assessment & Plan: Reassess in 6 weeks on Myrbetriq 50 mg samples and prescription and come back for pelvic examination and cystoscopy.  Call if culture positive  There are no diagnoses linked to this  encounter.  No follow-ups on file.  Reece Packer, MD  [facility] [address], [place], [place] [address] [phone]

## 2022-04-06 NOTE — Patient Instructions (Signed)

## 2022-04-06 NOTE — Addendum Note (Signed)
Addended by: Verlene Mayer A on: 04/06/2022 01:30 PM   Modules accepted: Orders

## 2022-04-07 ENCOUNTER — Other Ambulatory Visit: Payer: Self-pay | Admitting: Internal Medicine

## 2022-04-08 ENCOUNTER — Encounter: Payer: Self-pay | Admitting: Internal Medicine

## 2022-04-08 NOTE — Telephone Encounter (Signed)
Remain off metformin.  Continue to spot check blood sugars.  I am going to hold on adding any new medication currently.  Continue to spot check sugars and send in readings.

## 2022-04-09 LAB — CULTURE, URINE COMPREHENSIVE

## 2022-04-12 ENCOUNTER — Encounter: Payer: Self-pay | Admitting: Internal Medicine

## 2022-04-12 DIAGNOSIS — N898 Other specified noninflammatory disorders of vagina: Secondary | ICD-10-CM | POA: Insufficient documentation

## 2022-04-12 NOTE — Assessment & Plan Note (Signed)
Urinalysis does not appear to be c/w UTI.  Hold on abx.  Treat with nystatin cream.  Follow.  Wait culture results.

## 2022-04-12 NOTE — Assessment & Plan Note (Signed)
Sugars as outlined.  Follow met b and a1c. Back on metformin q day.  Follow bowel issues.

## 2022-04-12 NOTE — Progress Notes (Signed)
Patient ID: Misty Jones, female   DOB: 05/19/1941, 81 y.o.   MRN: [id number]   Virtual Visit via video Note   All issues noted in this document were discussed and addressed.  No physical exam was performed (except for noted visual exam findings with Video Visits).   I connected with Misty Jones by a video enabled telemedicine application or telephone and verified that I am speaking with the correct person using two identifiers. Location patient: home Location provider: work  Persons participating in the virtual visit: patient, provider  The limitations, risks, security and privacy concerns of performing an evaluation and management service by video and the availability of in person appointments have been discussed.  It has also been discussed with the patient that there may be a patient responsible charge related to this service. The patient expressed understanding and agreed to proceed.   Reason for visit: work in appt  HPI: Work in with concerns regarding some discomfort - vaginal area and pressure - bladder. Pressure noted two nights ago. No vaginal discharge.  No hematuria.  Previously noticed burning.  Has had some diarrhea.  Stopped metformin.  Cleared.  Restarted metformin - one per day.  Blood sugars 145-155.     ROS: See pertinent positives and negatives per HPI.  Past Medical History:  Diagnosis Date   B12 deficiency    Chicken pox    Diabetes mellitus (HCC)    GERD (gastroesophageal reflux disease)    History of diverticulitis of colon    History of dysplastic nevus 07/05/2018   left mid back shv - with moderate atypia   Hypercholesterolemia    Hypertension    Nasal drainage     Past Surgical History:  Procedure Laterality Date   ABDOMINAL HYSTERECTOMY  1984   BACK SURGERY  1986   ruptured disc, Dr Hardin Negus [facility])   [place]   COLONOSCOPY WITH PROPOFOL N/A 06/29/2016   Procedure: COLONOSCOPY WITH PROPOFOL;  Surgeon:  Manya Silvas, MD;  Location: [place];  Service: Endoscopy;  Laterality: N/A;   ESOPHAGOGASTRODUODENOSCOPY (EGD) WITH PROPOFOL N/A 12/13/2017   Procedure: ESOPHAGOGASTRODUODENOSCOPY (EGD) WITH PROPOFOL;  Surgeon: Manya Silvas, MD;  Location: [facility] ENDOSCOPY;  Service: Endoscopy;  Laterality: N/A;   TONSILLECTOMY      Family History  Problem Relation Age of Onset   Hypertension Mother    Cancer Mother        Mouth cancer   Heart disease Father        myocardial infarction   Raynaud syndrome Sister    Asthma Sister    Congenital heart disease Sister    Thyroid disease Sister    Thyroid cancer Daughter    Breast cancer Neg Hx    Colon cancer Neg Hx     SOCIAL HX: reviewed.    Current Outpatient Medications:    aspirin 81 MG tablet, Take 81 mg by mouth daily., Disp: , Rfl:    cyanocobalamin (,VITAMIN B-12,) 1000 MCG/ML injection, INJECT 1 ML INTRAMUSCULARLY ONCE EVERY MONTH, Disp: 30 mL, Rfl: 0   glucose blood (CONTOUR TEST) test strip, USE 1 STRIP TO CHECK GLUCOSE ONCE DAILY., Disp: 50 each, Rfl: 5   hydrochlorothiazide (HYDRODIURIL) 25 MG tablet, Take 1/2 (one-half) tablet by mouth once daily, Disp: 90 tablet, Rfl: 1   Lancets MISC, Check sugars once a day Dx: E11.9 (Contour Next), Disp: 100 each, Rfl: 3   Multiple Vitamins-Minerals (PRESERVISION AREDS 2 PO), Take by  mouth., Disp: , Rfl:    niacin 500 MG tablet, Take 500 mg by mouth at bedtime., Disp: , Rfl:    omeprazole (PRILOSEC) 20 MG capsule, Take 20 mg by mouth daily., Disp: , Rfl:    Syringe/Needle, Disp, (SYRINGE 3CC/25GX1") 25G X 1" 3 ML MISC, Use as directed to administer b12 injections., Disp: 50 each, Rfl: 1   benazepril (LOTENSIN) 40 MG tablet, Take 1 tablet by mouth once daily, Disp: 90 tablet, Rfl: 0   metFORMIN (GLUCOPHAGE-XR) 500 MG 24 hr tablet, Take 1 tablet by mouth twice daily, Disp: 180 tablet, Rfl: 0   mirabegron ER (MYRBETRIQ) 50 MG TB24 tablet, Take 1 tablet (50 mg total) by mouth daily., Disp:  30 tablet, Rfl: 11   nystatin cream (MYCOSTATIN), Apply 1 application. topically 2 (two) times daily., Disp: 30 g, Rfl: 0   tacrolimus (PROTOPIC) 0.1 % ointment, , Disp: , Rfl:   EXAM:  GENERAL: alert, oriented, appears well and in no acute distress  HEENT: atraumatic, conjunttiva clear, no obvious abnormalities on inspection of external nose and ears  NECK: normal movements of the head and neck  LUNGS: on inspection no signs of respiratory distress, breathing rate appears normal, no obvious gross SOB, gasping or wheezing  CV: no obvious cyanosis  PSYCH/NEURO: pleasant and cooperative, no obvious depression or anxiety, speech and thought processing grossly intact  ASSESSMENT AND PLAN:  Discussed the following assessment and plan:  Problem List Items Addressed This Visit     Diabetes mellitus ([place])    Sugars as outlined.  Follow met b and a1c. Back on metformin q day.  Follow bowel issues.       Vaginal irritation    Urinalysis does not appear to be c/w UTI.  Hold on abx.  Treat with nystatin cream.  Follow.  Wait culture results.         Return if symptoms worsen or fail to improve.   I discussed the assessment and treatment plan with the patient. The patient was provided an opportunity to ask questions and all were answered. The patient agreed with the plan and demonstrated an understanding of the instructions.   The patient was advised to call back or seek an in-person evaluation if the symptoms worsen or if the condition fails to improve as anticipated.   [name], MD

## 2022-04-14 NOTE — Telephone Encounter (Signed)
Can schedule an appt to discuss other treatment options so she is not having diarrhea.

## 2022-04-20 ENCOUNTER — Other Ambulatory Visit: Payer: Self-pay | Admitting: Internal Medicine

## 2022-04-20 DIAGNOSIS — Z1231 Encounter for screening mammogram for malignant neoplasm of breast: Secondary | ICD-10-CM

## 2022-05-04 ENCOUNTER — Other Ambulatory Visit (INDEPENDENT_AMBULATORY_CARE_PROVIDER_SITE_OTHER): Payer: Medicare Other

## 2022-05-04 DIAGNOSIS — E7800 Pure hypercholesterolemia, unspecified: Secondary | ICD-10-CM | POA: Diagnosis not present

## 2022-05-04 DIAGNOSIS — E1165 Type 2 diabetes mellitus with hyperglycemia: Secondary | ICD-10-CM | POA: Diagnosis not present

## 2022-05-04 LAB — HEPATIC FUNCTION PANEL
ALT: 15 U/L (ref 0–35)
AST: 13 U/L (ref 0–37)
Albumin: 4 g/dL (ref 3.5–5.2)
Alkaline Phosphatase: 81 U/L (ref 39–117)
Bilirubin, Direct: 0.1 mg/dL (ref 0.0–0.3)
Total Bilirubin: 0.5 mg/dL (ref 0.2–1.2)
Total Protein: 6.3 g/dL (ref 6.0–8.3)

## 2022-05-04 LAB — BASIC METABOLIC PANEL
BUN: 17 mg/dL (ref 6–23)
CO2: 32 mEq/L (ref 19–32)
Calcium: 9.4 mg/dL (ref 8.4–10.5)
Chloride: 99 mEq/L (ref 96–112)
Creatinine, Ser: 0.87 mg/dL (ref 0.40–1.20)
GFR: 62.23 mL/min (ref >60.00)
Glucose, Bld: 155 mg/dL — ABNORMAL HIGH (ref 70–99)
Potassium: 4.8 mEq/L (ref 3.5–5.1)
Sodium: 141 mEq/L (ref 135–145)

## 2022-05-04 LAB — LIPID PANEL
Cholesterol: 287 mg/dL — ABNORMAL HIGH (ref 0–200)
HDL: 54.6 mg/dL (ref >39.00)
LDL Cholesterol: 193 mg/dL — ABNORMAL HIGH (ref 0–99)
NonHDL: 232.42
Total CHOL/HDL Ratio: 5
Triglycerides: 197 mg/dL — ABNORMAL HIGH (ref 0.0–149.0)
VLDL: 39.4 mg/dL (ref 0.0–40.0)

## 2022-05-04 LAB — TSH: TSH: 3.34 u[IU]/mL (ref 0.35–5.50)

## 2022-05-04 LAB — HEMOGLOBIN A1C: Hgb A1c MFr Bld: 7.6 % — ABNORMAL HIGH (ref 4.6–6.5)

## 2022-05-07 ENCOUNTER — Ambulatory Visit (INDEPENDENT_AMBULATORY_CARE_PROVIDER_SITE_OTHER): Payer: Medicare Other | Admitting: Internal Medicine

## 2022-05-07 ENCOUNTER — Encounter: Payer: Self-pay | Admitting: Internal Medicine

## 2022-05-07 ENCOUNTER — Ambulatory Visit (INDEPENDENT_AMBULATORY_CARE_PROVIDER_SITE_OTHER): Payer: Medicare Other

## 2022-05-07 VITALS — BP 128/84 | HR 85 | Temp 98.0°F | Ht 64.0 in | Wt 210.2 lb

## 2022-05-07 VITALS — BP 132/86 | HR 85 | Temp 98.0°F | Ht 64.0 in | Wt 210.6 lb

## 2022-05-07 DIAGNOSIS — Z0000 Encounter for general adult medical examination without abnormal findings: Secondary | ICD-10-CM

## 2022-05-07 DIAGNOSIS — K219 Gastro-esophageal reflux disease without esophagitis: Secondary | ICD-10-CM | POA: Diagnosis not present

## 2022-05-07 DIAGNOSIS — I10 Essential (primary) hypertension: Secondary | ICD-10-CM | POA: Diagnosis not present

## 2022-05-07 DIAGNOSIS — Z86010 Personal history of colonic polyps: Secondary | ICD-10-CM

## 2022-05-07 DIAGNOSIS — Z23 Encounter for immunization: Secondary | ICD-10-CM

## 2022-05-07 DIAGNOSIS — I700 Atherosclerosis of aorta: Secondary | ICD-10-CM

## 2022-05-07 DIAGNOSIS — E1165 Type 2 diabetes mellitus with hyperglycemia: Secondary | ICD-10-CM | POA: Diagnosis not present

## 2022-05-07 DIAGNOSIS — E7800 Pure hypercholesterolemia, unspecified: Secondary | ICD-10-CM | POA: Diagnosis not present

## 2022-05-07 DIAGNOSIS — B351 Tinea unguium: Secondary | ICD-10-CM

## 2022-05-07 LAB — HM DIABETES FOOT EXAM

## 2022-05-07 MED ORDER — LOVASTATIN 10 MG PO TABS: ORAL_TABLET | ORAL | 2 refills | Status: DC

## 2022-05-07 MED ORDER — GLIPIZIDE ER 2.5 MG PO TB24: 2.5000 mg | ORAL_TABLET | Freq: Every day | ORAL | 2 refills | Status: DC

## 2022-05-07 NOTE — Progress Notes (Signed)
Patient ID: Misty Jones, female   DOB: [birth year], 81 y.o.   MRN: [id number]   Subjective:    Patient ID: Misty Jones, female    DOB: 04-05-1941, 81 y.o.   MRN: [id number]   Patient here for  Chief Complaint  Patient presents with   Follow-up    Follow up   .   HPI Here to follow up regarding her blood sugar, cholesterol and blood pressure.  Since being off metformin, diarrhea has resolved.  Feels better.  A1c increased - 7.6.  discussed low carb diet and exercise.  Discussed diabetic treatment options.  No chest pain or sob reported.  No cough or congestion.  No abdominal pain.  Bowels moving.  Thickened toe nails and some pain related.  Discussed podiatry referral.     Past Medical History:  Diagnosis Date   B12 deficiency    Chicken pox    Diabetes mellitus ([place])    GERD (gastroesophageal reflux disease)    History of diverticulitis of colon    History of dysplastic nevus 07/05/2018   left mid back shv - with moderate atypia   Hypercholesterolemia    Hypertension    Nasal drainage    Past Surgical History:  Procedure Laterality Date   ABDOMINAL HYSTERECTOMY  [place]   ruptured disc, Dr Hardin Negus [facility])   [place]   COLONOSCOPY WITH PROPOFOL N/A 06/29/2016   Procedure: COLONOSCOPY WITH PROPOFOL;  Surgeon: Manya Silvas, MD;  Location: [place];  Service: Endoscopy;  Laterality: N/A;   ESOPHAGOGASTRODUODENOSCOPY (EGD) WITH PROPOFOL N/A 12/13/2017   Procedure: ESOPHAGOGASTRODUODENOSCOPY (EGD) WITH PROPOFOL;  Surgeon: Manya Silvas, MD;  Location[facility] ENDOSCOPY;  Service: Endoscopy;  Laterality: N/A;   TONSILLECTOMY     Family History  Problem Relation Age of Onset   Hypertension Mother    Cancer Mother        Mouth cancer   Heart disease Father        myocardial infarction   Raynaud syndrome Sister    Asthma Sister    Congenital heart disease Sister    Thyroid disease Sister    Thyroid  cancer Daughter    Breast cancer Neg Hx    Colon cancer Neg Hx    Social History   Socioeconomic History   Marital status: Divorced    Spouse name: Not on file   Number of children: 2   Years of education: Not on file   Highest education level: Not on file  Occupational History   Occupation: retired  Tobacco Use   Smoking status: Former    Packs/day: 0.50    Years: 20.00    Total pack years: 10.00    Types: Cigarettes    Quit date: 07/26/1984    Years since quitting: 37.8   Smokeless tobacco: Never   Tobacco comments:    quit smoking years ago  Vaping Use   Vaping Use: Never used  Substance and Sexual Activity   Alcohol use: No    Alcohol/week: 0.0 standard drinks of alcohol   Drug use: No   Sexual activity: Never  Other Topics Concern   Not on file  Social History Narrative   Not on file   Social Determinants of Health   Financial Resource Strain: Low Risk  (05/07/2022)   Overall Financial Resource Strain (CARDIA)    Difficulty of Paying Living Expenses: Not hard at all  Food Insecurity:  No Food Insecurity (05/07/2022)   Hunger Vital Sign    Worried About Running Out of Food in the Last Year: Never true    Ran Out of Food in the Last Year: Never true  Transportation Needs: No Transportation Needs (05/07/2022)   PRAPARE - Hydrologist (Medical): No    Lack of Transportation (Non-Medical): No  Physical Activity: Not on file  Stress: No Stress Concern Present (05/07/2022)   [place]    Feeling of Stress : Not at all  Social Connections: Unknown (05/07/2022)   Social Connection and Isolation Panel [NHANES]    Frequency of Communication with Friends and Family: More than three times a week    Frequency of Social Gatherings with Friends and Family: Not on file    Attends Religious Services: Not on file    Active Member of Clubs or Organizations: Not on file    Attends  Archivist Meetings: Not on file    Marital Status: Not on file     Review of Systems  Constitutional:  Negative for appetite change and unexpected weight change.  HENT:  Negative for congestion and sinus pressure.   Respiratory:  Negative for cough, chest tightness and shortness of breath.   Cardiovascular:  Negative for chest pain, palpitations and leg swelling.  Gastrointestinal:  Negative for abdominal pain, diarrhea, nausea and vomiting.  Genitourinary:  Negative for difficulty urinating and dysuria.  Musculoskeletal:  Negative for joint swelling and myalgias.  Skin:  Negative for color change and rash.  Neurological:  Negative for dizziness, light-headedness and headaches.  Psychiatric/Behavioral:  Negative for agitation and dysphoric mood.        Objective:     BP 128/84   Pulse 85   Temp 98 F (36.7 C) (Oral)   Ht '5\' 4"'$  (1.626 m)   Wt 210 lb 3.2 oz (95.3 kg)   SpO2 98%   BMI 36.08 kg/m  Wt Readings from Last 3 Encounters:  05/07/22 210 lb 3.2 oz (95.3 kg)  05/07/22 210 lb 9.6 oz (95.5 kg)  04/06/22 207 lb (93.9 kg)    Physical Exam Vitals reviewed.  Constitutional:      General: She is not in acute distress.    Appearance: Normal appearance.  HENT:     Head: Normocephalic and atraumatic.     Right Ear: External ear normal.     Left Ear: External ear normal.  Eyes:     General: No scleral icterus.       Right eye: No discharge.        Left eye: No discharge.     Conjunctiva/sclera: Conjunctivae normal.  Neck:     Thyroid: No thyromegaly.  Cardiovascular:     Rate and Rhythm: Normal rate and regular rhythm.  Pulmonary:     Effort: No respiratory distress.     Breath sounds: Normal breath sounds. No wheezing.  Abdominal:     General: Bowel sounds are normal.     Palpations: Abdomen is soft.     Tenderness: There is no abdominal tenderness.  Musculoskeletal:        General: No swelling or tenderness.     Cervical back: Neck supple. No  tenderness.  Lymphadenopathy:     Cervical: No cervical adenopathy.  Skin:    Findings: No erythema or rash.  Neurological:     Mental Status: She is alert.  Psychiatric:  Mood and Affect: Mood normal.        Behavior: Behavior normal.      Outpatient Encounter Medications as of 05/07/2022  Medication Sig   aspirin 81 MG tablet Take 81 mg by mouth daily.   benazepril (LOTENSIN) 40 MG tablet Take 1 tablet by mouth once daily   cyanocobalamin (,VITAMIN B-12,) 1000 MCG/ML injection INJECT 1 ML INTRAMUSCULARLY ONCE EVERY MONTH   glipiZIDE (GLUCOTROL XL) 2.5 MG 24 hr tablet Take 1 tablet (2.5 mg total) by mouth daily with breakfast.   glucose blood (CONTOUR TEST) test strip USE 1 STRIP TO CHECK GLUCOSE ONCE DAILY.   hydrochlorothiazide (HYDRODIURIL) 25 MG tablet Take 1/2 (one-half) tablet by mouth once daily   Lancets MISC Check sugars once a day Dx: E11.9 (Contour Next)   lovastatin (MEVACOR) 10 MG tablet Take one tablet on Monday and Thursday.   Multiple Vitamins-Minerals (PRESERVISION AREDS 2 PO) Take by mouth.   niacin 500 MG tablet Take 500 mg by mouth at bedtime.   nystatin cream (MYCOSTATIN) Apply 1 application. topically 2 (two) times daily.   omeprazole (PRILOSEC) 20 MG capsule Take 20 mg by mouth daily.   Syringe/Needle, Disp, (SYRINGE 3CC/25GX1") 25G X 1" 3 ML MISC Use as directed to administer b12 injections.   tacrolimus (PROTOPIC) 0.1 % ointment    [DISCONTINUED] metFORMIN (GLUCOPHAGE-XR) 500 MG 24 hr tablet Take 1 tablet by mouth twice daily (Patient not taking: Reported on 05/07/2022)   [DISCONTINUED] mirabegron ER (MYRBETRIQ) 50 MG TB24 tablet Take 1 tablet (50 mg total) by mouth daily. (Patient not taking: Reported on 05/07/2022)   No facility-administered encounter medications on file as of 05/07/2022.     Lab Results  Component Value Date   WBC 7.3 12/31/2021   HGB 12.3 12/31/2021   HCT 37.8 12/31/2021   PLT 318.0 12/31/2021   GLUCOSE 155 (H) 05/04/2022    CHOL 287 (H) 05/04/2022   TRIG 197.0 (H) 05/04/2022   HDL 54.60 05/04/2022   LDLDIRECT 150.0 12/31/2021   LDLCALC 193 (H) 05/04/2022   ALT 15 05/04/2022   AST 13 05/04/2022   NA 141 05/04/2022   K 4.8 05/04/2022   CL 99 05/04/2022   CREATININE 0.87 05/04/2022   BUN 17 05/04/2022   CO2 32 05/04/2022   TSH 3.34 05/04/2022   INR 0.9 04/03/2012   HGBA1C 7.6 (H) 05/04/2022   MICROALBUR <0.7 04/01/2022    MM 3D SCREEN BREAST BILATERAL  Result Date: 05/21/2021 CLINICAL DATA:  Screening. EXAM: DIGITAL SCREENING BILATERAL MAMMOGRAM WITH TOMOSYNTHESIS AND CAD TECHNIQUE: Bilateral screening digital craniocaudal and mediolateral oblique mammograms were obtained. Bilateral screening digital breast tomosynthesis was performed. The images were evaluated with computer-aided detection. COMPARISON:  Previous exam(s). ACR Breast Density Category b: There are scattered areas of fibroglandular density. FINDINGS: There are no findings suspicious for malignancy. IMPRESSION: No mammographic evidence of malignancy. A result letter of this screening mammogram will be mailed directly to the patient. RECOMMENDATION: Screening mammogram in one year. (Code:SM-B-01Y) BI-RADS CATEGORY  1: Negative. Electronically Signed   By: Dorise Bullion III M.D.   On: 05/21/2021 17:46      Assessment & Plan:   Problem List Items Addressed This Visit     Aortic atherosclerosis ([place])    Intolerant to statins and zetia.  Off repatha.  Low cholesterol diet and exercise.  Follow lipid panel and liver function tests.   Lab Results  Component Value Date   CHOL 287 (H) 05/04/2022   HDL 54.60 05/04/2022  LDLCALC 193 (H) 05/04/2022   LDLDIRECT 150.0 12/31/2021   TRIG 197.0 (H) 05/04/2022   CHOLHDL 5 05/04/2022        Relevant Medications   lovastatin (MEVACOR) 10 MG tablet   Diabetes mellitus ([place])    Since being off metformin, diarrhea has resolved.  Will hold on trying GLP 1 agonist, given concern regarding previous  GI issues.  Did not tolerate jardiance.  a1c elevated.  Discussed trial of low dose glipizide XL 2.'5mg'$  q day.  Follow sugars.  Low carb diet and exercise.  Follow for low sugars.       Relevant Medications   glipiZIDE (GLUCOTROL XL) 2.5 MG 24 hr tablet   lovastatin (MEVACOR) 10 MG tablet   Other Relevant Orders   Basic Metabolic Panel (BMET)   Hemoglobin A1c   GERD (gastroesophageal reflux disease)    Continue prilosec.  No chest pain or increased symptoms reported.  Follow.       History of colonic polyps    Colonoscopy 12.2017 - one polyp in ascending colon and two rectal polyps (tubular adenomas).  recommended f/u in 5 years.  - due f/u.       Hypercholesterolemia    Intolerant to statins, zetia and repatha.  On no medication now.  Discussed treatment options.  Will try lovastatin - low dose - two days per week. Low cholesterol diet and exercise.  Follow lipid panel and liver function tests.       Relevant Medications   lovastatin (MEVACOR) 10 MG tablet   Other Relevant Orders   Lipid panel   Hepatic function panel   Hepatic function panel   Hypertension - Primary    Blood pressure has been doing well.  Continue benazepril and hctz.  Follow pressures.  Follow metabolic panel.       Relevant Medications   lovastatin (MEVACOR) 10 MG tablet   Other Relevant Orders   Basic Metabolic Panel (BMET)   Onychomycosis    Thickened toe nails.  Some pain.  Refer to podiatry for further evaluation and treatment.       Relevant Orders   Ambulatory referral to Podiatry   Other Visit Diagnoses     Need for immunization against influenza       Relevant Orders   Flu Vaccine QUAD High Dose(Fluad) (Completed)        [name], MD

## 2022-05-07 NOTE — Progress Notes (Signed)
Subjective:   Misty Jones is a 81 y.o. female who presents for Medicare Annual (Subsequent) preventive examination.  Review of Systems    No ROS.  Medicare Wellness    Cardiac Risk Factors include: advanced age (>10mn, >>73women);diabetes mellitus     Objective:    Today's Vitals   05/07/22 1107  BP: 132/86  Pulse: 85  Temp: 98 F (36.7 C)  SpO2: 98%  Weight: 210 lb 9.6 oz (95.5 kg)  Height: '5\' 4"'$  (1.626 m)   Body mass index is 36.15 kg/m.     05/07/2022   11:23 AM 01/21/2021    1:28 PM 01/15/2020    1:49 PM 12/30/2018   10:13 AM 01/31/2018    4:11 PM 12/13/2017    8:22 AM 12/11/2016   10:13 AM  Advanced Directives  Does Patient Have a Medical Advance Directive? Yes Yes Yes Yes Yes Yes Yes  Type of AParamed[place]of [place]Living will HH[place]Living will Living will Living will HVandiverLiving will HCentral CityLiving will Living will  Does patient want to make changes to medical advance directive? No - Patient declined No - Patient declined No - Patient declined No - Patient declined No - Patient declined  No - Patient declined  Copy of HFort Campbell Northin Chart? Yes - validated most recent copy scanned in chart (See row information) No - copy requested    No - copy requested     Current Medications (verified) Outpatient Encounter Medications as of 05/07/2022  Medication Sig   aspirin 81 MG tablet Take 81 mg by mouth daily.   benazepril (LOTENSIN) 40 MG tablet Take 1 tablet by mouth once daily   cyanocobalamin (,VITAMIN B-12,) 1000 MCG/ML injection INJECT 1 ML INTRAMUSCULARLY ONCE EVERY MONTH   glucose blood (CONTOUR TEST) test strip USE 1 STRIP TO CHECK GLUCOSE ONCE DAILY.   hydrochlorothiazide (HYDRODIURIL) 25 MG tablet Take 1/2 (one-half) tablet by mouth once daily   Lancets MISC Check sugars once a day Dx: E11.9 (Contour Next)   metFORMIN (GLUCOPHAGE-XR) 500 MG 24 hr tablet  Take 1 tablet by mouth twice daily (Patient not taking: Reported on 05/07/2022)   mirabegron ER (MYRBETRIQ) 50 MG TB24 tablet Take 1 tablet (50 mg total) by mouth daily. (Patient not taking: Reported on 05/07/2022)   Multiple Vitamins-Minerals (PRESERVISION AREDS 2 PO) Take by mouth.   niacin 500 MG tablet Take 500 mg by mouth at bedtime.   nystatin cream (MYCOSTATIN) Apply 1 application. topically 2 (two) times daily.   omeprazole (PRILOSEC) 20 MG capsule Take 20 mg by mouth daily.   Syringe/Needle, Disp, (SYRINGE 3CC/25GX1") 25G X 1" 3 ML MISC Use as directed to administer b12 injections.   tacrolimus (PROTOPIC) 0.1 % ointment    No facility-administered encounter medications on file as of 05/07/2022.    Allergies (verified) Augmentin [amoxicillin-pot clavulanate] and Protonix [pantoprazole sodium]   History: Past Medical History:  Diagnosis Date   B12 deficiency    Chicken pox    Diabetes mellitus (HCC)    GERD (gastroesophageal reflux disease)    History of diverticulitis of colon    History of dysplastic nevus 07/05/2018   left mid back shv - with moderate atypia   Hypercholesterolemia    Hypertension    Nasal drainage    Past Surgical History:  Procedure Laterality Date   ABDOMINAL HYSTERECTOMY  1984   BACK SURGERY  1986   ruptured disc, Dr  [facility]in [facility])   [place]   COLONOSCOPY WITH PROPOFOL N/A 06/29/2016   Procedure: COLONOSCOPY WITH PROPOFOL;  Surgeon: Manya Silvas, MD;  Location: [facility] ENDOSCOPY;  Service: Endoscopy;  Laterality: N/A;   ESOPHAGOGASTRODUODENOSCOPY (EGD) WITH PROPOFOL N/A 12/13/2017   Procedure: ESOPHAGOGASTRODUODENOSCOPY (EGD) WITH PROPOFOL;  Surgeon: Manya Silvas, MD;  Location: [facility] ENDOSCOPY;  Service: Endoscopy;  Laterality: N/A;   TONSILLECTOMY     Family History  Problem Relation Age of Onset   Hypertension Mother    Cancer Mother        Mouth cancer   Heart disease Father         myocardial infarction   Raynaud syndrome Sister    Asthma Sister    Congenital heart disease Sister    Thyroid disease Sister    Thyroid cancer Daughter    Breast cancer Neg Hx    Colon cancer Neg Hx    Social History   Socioeconomic History   Marital status: Divorced    Spouse name: Not on file   Number of children: 2   Years of education: Not on file   Highest education level: Not on file  Occupational History   Occupation: retired  Tobacco Use   Smoking status: Former    Packs/day: 0.50    Years: 20.00    Total pack years: 10.00    Types: Cigarettes    Quit date: 07/26/1984    Years since quitting: 37.8   Smokeless tobacco: Never   Tobacco comments:    quit smoking years ago  Vaping Use   Vaping Use: Never used  Substance and Sexual Activity   Alcohol use: No    Alcohol/week: 0.0 standard drinks of alcohol   Drug use: No   Sexual activity: Never  Other Topics Concern   Not on file  Social History Narrative   Not on file   Social Determinants of Health   Financial Resource Strain: Low Risk  (05/07/2022)   Overall Financial Resource Strain (CARDIA)    Difficulty of Paying Living Expenses: Not hard at all  Food Insecurity: No Food Insecurity (05/07/2022)   Hunger Vital Sign    Worried About Running Out of Food in the Last Year: Never true    Ran Out of Food in the Last Year: Never true  Transportation Needs: No Transportation Needs (05/07/2022)   PRAPARE - Hydrologist (Medical): No    Lack of Transportation (Non-Medical): No  Physical Activity: Not on file  Stress: No Stress Concern Present (05/07/2022)   [place]    Feeling of Stress : Not at all  Social Connections: Unknown (05/07/2022)   Social Connection and Isolation Panel [NHANES]    Frequency of Communication with Friends and Family: More than three times a week    Frequency of Social Gatherings with  Friends and Family: Not on file    Attends Religious Services: Not on Advertising copywriter or Organizations: Not on file    Attends Archivist Meetings: Not on file    Marital Status: Not on file    Tobacco Counseling Counseling given: Not Answered Tobacco comments: quit smoking years ago   Clinical Intake:  Pre-visit preparation completed: Yes        Diabetes: Yes (Followed by PCP) Nutrition Risk Assessment: Does the patient have any non-healing wounds?  No  Has  the patient had any unintentional weight loss or weight gain?  No   Financial Strains and Diabetes Management: Are you having any financial strains with the device, your supplies or your medication? No .  Does the patient want to be seen by Chronic Care Management for management of their diabetes?  No  Would the patient like to be referred to a Nutritionist or for Diabetic Management?  No    How often do you need to have someone help you when you read instructions, pamphlets, or other written materials from your doctor or pharmacy?: 1 - Never    Interpreter Needed?: No      Activities of Daily Living    05/07/2022   11:24 AM  In your present state of health, do you have any difficulty performing the following activities:  Hearing? 0  Vision? 0  Difficulty concentrating or making decisions? 0  Walking or climbing stairs? 0  Dressing or bathing? 0  Doing errands, shopping? 0  Preparing Food and eating ? N  Using the Toilet? N  In the past six months, have you accidently leaked urine? N  Do you have problems with loss of bowel control? N  Managing your Medications? N  Managing your Finances? N  Housekeeping or managing your Housekeeping? N    Patient Care Team: [name], MD as PCP - General (Internal Medicine) [name], MD (Internal Medicine)  Indicate any recent Medical Services you may have received from other than [facility] providers in the past year (date may be  approximate).     Assessment:   This is a routine wellness examination for Misty Jones.  Hearing/Vision screen Hearing Screening - Comments:: Difficulty hearing conversational tones. Audiology deferred per patient. She does not wear hearing aids.  Vision Screening - Comments:: Followed by [facility] Wears corrective lenses  Macular degeneration; [facility] Cataract extraction, bilateral  They have seen their ophthalmologist in the last 6 months.  Dietary issues and exercise activities discussed:   Regular diet   Goals Addressed             This Visit's Progress    Follow up with Primary Care Provider       As needed.       Depression Screen    05/07/2022    1:04 PM 05/07/2022   11:19 AM 04/02/2022    4:29 PM 01/05/2022    2:09 PM 11/04/2021    2:31 PM 01/21/2021    1:26 PM 01/17/2021   11:31 AM  PHQ 2/9 Scores  PHQ - 2 Score 0 1 0 0 1 0 0    Fall Risk    05/07/2022    1:04 PM 05/07/2022   11:36 AM 04/02/2022    4:29 PM 01/05/2022    2:09 PM 11/04/2021    2:31 PM  [place] in the past year? 0 0 0 0 0  Number falls in past yr:  0 0 0   Injury with Fall?   0 0   Risk for fall due to : No Fall Risks No Fall Risks No Fall Risks No Fall Risks No Fall Risks  Follow up Falls evaluation completed Falls evaluation completed Falls evaluation completed Falls evaluation completed Falls evaluation completed    [place]: Home free of loose throw rugs in walkways, pet beds, electrical cords, etc? Yes  Adequate lighting in your home to reduce risk of falls? Yes   ASSISTIVE DEVICES UTILIZED  TO PREVENT FALLS: Life alert? No  Use of a cane, walker or w/c? No  Grab bars in the bathroom? No  Shower chair or bench in shower? No  Elevated toilet seat or a handicapped toilet? No   TIMED UP AND GO: Was the test performed? Yes .  Length of time to ambulate 10 feet: 10 sec.   Gait steady and fast without use of assistive  device  Cognitive Function:    12/11/2016   10:42 AM 12/12/2015   10:59 AM  MMSE - Mini Mental State Exam  Orientation to time 5 5  Orientation to Place 5 5  Registration 3 3  Attention/ Calculation 5 5  Recall 3 3  Language- name 2 objects 2 2  Language- repeat 1 1  Language- follow 3 step command 3 3  Language- read & follow direction 1 1  Write a sentence 1 1  Copy design 1 1  Total score 30 30        05/07/2022   11:36 AM 01/15/2020    1:56 PM 12/30/2018   10:16 AM 12/24/2017   12:05 PM  6CIT Screen  What Year? 0 points 0 points 0 points 0 points  What month? 0 points 0 points 0 points 0 points  What time? 0 points  0 points 0 points  Count back from 20 0 points  0 points 0 points  Months in reverse 0 points 0 points 0 points 0 points  Repeat phrase 0 points 0 points 0 points 0 points  Total Score 0 points  0 points 0 points    Immunizations Immunization History  Administered Date(s) Administered   Fluad Quad(high Dose 65+) 05/31/2019, 05/30/2020, 04/30/2021   Influenza Split 05/31/2012, 04/16/2014   Influenza, High Dose Seasonal PF 03/25/2016, 04/23/2017, 05/09/2018   Influenza-Unspecified 04/26/2012, 05/31/2012, 05/03/2013, 04/16/2014, 05/08/2015, 03/25/2016   Moderna SARS-COV2 Booster Vaccination 06/28/2020   Moderna Sars-Covid-2 Vaccination 09/23/2019, 10/21/2019   Pneumococcal Conjugate-13 05/30/2013   Pneumococcal Polysaccharide-23 12/12/2015   Zoster, Live 06/28/2012    TDAP status: Due, Education has been provided regarding the importance of this vaccine. Advised may receive this vaccine at local pharmacy or Health Dept. Aware to provide a copy of the vaccination record if obtained from local pharmacy or Health Dept. Verbalized acceptance and understanding.Deferred.   Flu Vaccine status: Declined, Education has been provided regarding the importance of this vaccine but patient still declined. Advised may receive this vaccine at local pharmacy or Health  Dept. Aware to provide a copy of the vaccination record if obtained from local pharmacy or Health Dept. Verbalized acceptance and understanding.  Covid-19 vaccine status: Completed vaccines x2.  Shingrix vaccine- notes completed 2022. Agrees to update immunization record.   Screening Tests Health Maintenance  Topic Date Due   FOOT EXAM  04/30/2022   COVID-19 Vaccine (3 - Moderna risk series) 05/23/2022 (Originally 07/26/2020)   Zoster Vaccines- Shingrix (1 of 2) 08/07/2022 (Originally 05/02/1960)   INFLUENZA VACCINE  10/25/2022 (Originally 02/24/2022)   TETANUS/TDAP  05/08/2023 (Originally 12/29/2020)   MAMMOGRAM  05/19/2022   HEMOGLOBIN A1C  11/03/2022   OPHTHALMOLOGY EXAM  11/05/2022   Diabetic kidney evaluation - Urine ACR  04/02/2023   Diabetic kidney evaluation - GFR measurement  05/05/2023   Pneumonia Vaccine 36+ Years old  Completed   DEXA SCAN  Completed   HPV VACCINES  Aged Out   Health Maintenance Health Maintenance Due  Topic Date Due   FOOT EXAM  04/30/2022   Lung Cancer Screening: (  Low Dose CT Chest recommended if Age 89-80 years, 30 pack-year currently smoking OR have quit w/in 15years.) does not qualify.   Hepatitis C Screening: does not qualify.  Vision Screening: Recommended annual ophthalmology exams for early detection of glaucoma and other disorders of the eye.  Dental Screening: Recommended annual dental exams for proper oral hygiene  Community Resource Referral / Chronic Care Management: CRR required this visit?  No   CCM required this visit?  No      Plan:     I have personally reviewed and noted the following in the patient's chart:   Medical and social history Use of alcohol, tobacco or illicit drugs  Current medications and supplements including opioid prescriptions. Patient is not currently taking opioid prescriptions. Functional ability and status Nutritional status Physical activity Advanced directives List of other  physicians Hospitalizations, surgeries, and ER visits in previous 12 months Vitals Screenings to include cognitive, depression, and falls Referrals and appointments  In addition, I have reviewed and discussed with patient certain preventive protocols, quality metrics, and best practice recommendations. A written personalized care plan for preventive services as well as general preventive health recommendations were provided to patient.     Varney Biles, LPN   [id number]

## 2022-05-07 NOTE — Patient Instructions (Addendum)
Misty Jones , Thank you for taking time to come for your Medicare Wellness Visit. I appreciate your ongoing commitment to your health goals. Please review the following plan we discussed and let me know if I can assist you in the future.   These are the goals we discussed:  Goals       Patient Stated     Medication Management (pt-stated)      Patient Goals/Self-Care Activities Over the next 90 days, patient will:  - take medications as prescribed check glucose daily, document, and provide at future appointments collaborate with provider on medication access solutions       Other     Follow up with Primary Care Provider      As needed.        This is a list of the screening recommended for you and due dates:  Health Maintenance  Topic Date Due   Complete foot exam   04/30/2022   COVID-19 Vaccine (3 - Moderna risk series) 05/23/2022*   Zoster (Shingles) Vaccine (1 of 2) 08/07/2022*   Flu Shot  10/25/2022*   Tetanus Vaccine  05/08/2023*   Mammogram  05/19/2022   Hemoglobin A1C  11/03/2022   Eye exam for diabetics  11/05/2022   Yearly kidney health urinalysis for diabetes  04/02/2023   Yearly kidney function blood test for diabetes  05/05/2023   Pneumonia Vaccine  Completed   DEXA scan (bone density measurement)  Completed   HPV Vaccine  Aged Out  *Topic was postponed. The date shown is not the original due date.   Update immunization record with Shingles, Tdap and Influenza vaccine.  Advanced directives: on file  Next appointment: Follow up in one year for your annual wellness visit    Preventive Care 65 Years and Older, Female Preventive care refers to lifestyle choices and visits with your health care provider that can promote health and wellness. What does preventive care include? A yearly physical exam. This is also called an annual well check. Dental exams once or twice a year. Routine eye exams. Ask your health care provider how often you should have your eyes  checked. Personal lifestyle choices, including: Daily care of your teeth and gums. Regular physical activity. Eating a healthy diet. Avoiding tobacco and drug use. Limiting alcohol use. Practicing safe sex. Taking low-dose aspirin every day. Taking vitamin and mineral supplements as recommended by your health care provider. What happens during an annual well check? The services and screenings done by your health care provider during your annual well check will depend on your age, overall health, lifestyle risk factors, and family history of disease. Counseling  Your health care provider may ask you questions about your: Alcohol use. Tobacco use. Drug use. Emotional well-being. Home and relationship well-being. Sexual activity. Eating habits. History of falls. Memory and ability to understand (cognition). Work and work Statistician. Reproductive health. Screening  You may have the following tests or measurements: Height, weight, and BMI. Blood pressure. Lipid and cholesterol levels. These may be checked every 5 years, or more frequently if you are over 82 years old. Skin check. Lung cancer screening. You may have this screening every year starting at age 82 if you have a 30-pack-year history of smoking and currently smoke or have quit within the past 15 years. Fecal occult blood test (FOBT) of the stool. You may have this test every year starting at age 65. Flexible sigmoidoscopy or colonoscopy. You may have a sigmoidoscopy every 5 years or a  colonoscopy every 10 years starting at age 65. Hepatitis C blood test. Hepatitis B blood test. Sexually transmitted disease (STD) testing. Diabetes screening. This is done by checking your blood sugar (glucose) after you have not eaten for a while (fasting). You may have this done every 1-3 years. Bone density scan. This is done to screen for osteoporosis. You may have this done starting at age 82. Mammogram. This may be done every 1-2  years. Talk to your health care provider about how often you should have regular mammograms. Talk with your health care provider about your test results, treatment options, and if necessary, the need for more tests. Vaccines  Your health care provider may recommend certain vaccines, such as: Influenza vaccine. This is recommended every year. Tetanus, diphtheria, and acellular pertussis (Tdap, Td) vaccine. You may need a Td booster every 10 years. Zoster vaccine. You may need this after age 82. Pneumococcal 13-valent conjugate (PCV13) vaccine. One dose is recommended after age 82. Pneumococcal polysaccharide (PPSV23) vaccine. One dose is recommended after age 82. Talk to your health care provider about which screenings and vaccines you need and how often you need them. This information is not intended to replace advice given to you by your health care provider. Make sure you discuss any questions you have with your health care provider. Document Released: 08/09/2015 Document Revised: 04/01/2016 Document Reviewed: 05/14/2015 Elsevier Interactive Patient Education  2017 [place] Prevention in the Home Falls can cause injuries. They can happen to people of all ages. There are many things you can do to make your home safe and to help prevent falls. What can I do on the outside of my home? Regularly fix the edges of walkways and driveways and fix any cracks. Remove anything that might make you trip as you walk through a door, such as a raised step or threshold. Trim any bushes or trees on the path to your home. Use bright outdoor lighting. Clear any walking paths of anything that might make someone trip, such as rocks or tools. Regularly check to see if handrails are loose or broken. Make sure that both sides of any steps have handrails. Any raised decks and porches should have guardrails on the edges. Have any leaves, snow, or ice cleared regularly. Use sand or salt on walking paths  during winter. Clean up any spills in your garage right away. This includes oil or grease spills. What can I do in the bathroom? Use night lights. Install grab bars by the toilet and in the tub and shower. Do not use towel bars as grab bars. Use non-skid mats or decals in the tub or shower. If you need to sit down in the shower, use a plastic, non-slip stool. Keep the floor dry. Clean up any water that spills on the floor as soon as it happens. Remove soap buildup in the tub or shower regularly. Attach bath mats securely with double-sided non-slip rug tape. Do not have throw rugs and other things on the floor that can make you trip. What can I do in the bedroom? Use night lights. Make sure that you have a light by your bed that is easy to reach. Do not use any sheets or blankets that are too big for your bed. They should not hang down onto the floor. Have a firm chair that has side arms. You can use this for support while you get dressed. Do not have throw rugs and other things on the floor that can  make you trip. What can I do in the kitchen? Clean up any spills right away. Avoid walking on wet floors. Keep items that you use a lot in easy-to-reach places. If you need to reach something above you, use a strong step stool that has a grab bar. Keep electrical cords out of the way. Do not use floor polish or wax that makes floors slippery. If you must use wax, use non-skid floor wax. Do not have throw rugs and other things on the floor that can make you trip. What can I do with my stairs? Do not leave any items on the stairs. Make sure that there are handrails on both sides of the stairs and use them. Fix handrails that are broken or loose. Make sure that handrails are as long as the stairways. Check any carpeting to make sure that it is firmly attached to the stairs. Fix any carpet that is loose or worn. Avoid having throw rugs at the top or bottom of the stairs. If you do have throw  rugs, attach them to the floor with carpet tape. Make sure that you have a light switch at the top of the stairs and the bottom of the stairs. If you do not have them, ask someone to add them for you. What else can I do to help prevent falls? Wear shoes that: Do not have high heels. Have rubber bottoms. Are comfortable and fit you well. Are closed at the toe. Do not wear sandals. If you use a stepladder: Make sure that it is fully opened. Do not climb a closed stepladder. Make sure that both sides of the stepladder are locked into place. Ask someone to hold it for you, if possible. Clearly mark and make sure that you can see: Any grab bars or handrails. First and last steps. Where the edge of each step is. Use tools that help you move around (mobility aids) if they are needed. These include: Canes. Walkers. Scooters. Crutches. Turn on the lights when you go into a dark area. Replace any light bulbs as soon as they burn out. Set up your furniture so you have a clear path. Avoid moving your furniture around. If any of your floors are uneven, fix them. If there are any pets around you, be aware of where they are. Review your medicines with your doctor. Some medicines can make you feel dizzy. This can increase your chance of falling. Ask your doctor what other things that you can do to help prevent falls. This information is not intended to replace advice given to you by your health care provider. Make sure you discuss any questions you have with your health care provider. Document Released: 05/09/2009 Document Revised: 12/19/2015 Document Reviewed: 08/17/2014 Elsevier Interactive Patient Education  2017 [employer].

## 2022-05-11 ENCOUNTER — Encounter: Payer: Self-pay | Admitting: Internal Medicine

## 2022-05-11 DIAGNOSIS — B351 Tinea unguium: Secondary | ICD-10-CM | POA: Insufficient documentation

## 2022-05-11 NOTE — Assessment & Plan Note (Signed)
Intolerant to statins and zetia.  Off repatha.  Low cholesterol diet and exercise.  Follow lipid panel and liver function tests.   Lab Results  Component Value Date   CHOL 287 (H) 05/04/2022   HDL 54.60 05/04/2022   LDLCALC 193 (H) 05/04/2022   LDLDIRECT 150.0 12/31/2021   TRIG 197.0 (H) 05/04/2022   CHOLHDL 5 05/04/2022

## 2022-05-11 NOTE — Assessment & Plan Note (Signed)
Colonoscopy 12.2017 - one polyp in ascending colon and two rectal polyps (tubular adenomas).  recommended f/u in 5 years.  - due f/u.

## 2022-05-11 NOTE — Assessment & Plan Note (Signed)
Continue prilosec.  No chest pain or increased symptoms reported.  Follow.

## 2022-05-11 NOTE — Assessment & Plan Note (Signed)
Intolerant to statins, zetia and repatha.  On no medication now.  Discussed treatment options.  Will try lovastatin - low dose - two days per week. Low cholesterol diet and exercise.  Follow lipid panel and liver function tests.

## 2022-05-11 NOTE — Assessment & Plan Note (Signed)
Blood pressure has been doing well.  Continue benazepril and hctz.  Follow pressures.  Follow metabolic panel.  

## 2022-05-11 NOTE — Assessment & Plan Note (Signed)
Since being off metformin, diarrhea has resolved.  Will hold on trying GLP 1 agonist, given concern regarding previous GI issues.  Did not tolerate jardiance.  a1c elevated.  Discussed trial of low dose glipizide XL 2.'5mg'$  q day.  Follow sugars.  Low carb diet and exercise.  Follow for low sugars.

## 2022-05-11 NOTE — Assessment & Plan Note (Signed)
Thickened toe nails.  Some pain.  Refer to podiatry for further evaluation and treatment.

## 2022-05-12 ENCOUNTER — Telehealth: Payer: Medicare Other | Admitting: Family Medicine

## 2022-05-12 ENCOUNTER — Encounter: Payer: Self-pay | Admitting: Internal Medicine

## 2022-05-12 NOTE — Telephone Encounter (Signed)
Pt scheduled tomorrow for VV. Pt stated that sore throat was better this afternoon with using cough drops. She does have an at home Covid test kit & I asked her to test before VV tomorrow. She has also been taking Robitussin to ward off any cough & so far she hasn't really had any.

## 2022-05-13 ENCOUNTER — Telehealth (INDEPENDENT_AMBULATORY_CARE_PROVIDER_SITE_OTHER): Payer: Medicare Other | Admitting: Family Medicine

## 2022-05-13 DIAGNOSIS — J069 Acute upper respiratory infection, unspecified: Secondary | ICD-10-CM

## 2022-05-13 NOTE — Patient Instructions (Signed)
It was a pleasure meeting you today. Thank you for allowing me to take part in your health care.  Our goals for today as we discussed include:  For your upper respiratory infection I'm glad to hear that you are feeling better Continue to stay hydrated  Continue Robitussin every 4-6 hours.  This can cause dizziness, drowsiness and headaches so would use only if needed Can take Tylenol 500 mg three times a day as needed  Rest as needed but continue to remain active as much as you can without excess fatigue.  If you have worsening symptoms, especially difficulty breathing please call 911 or have someone take you to the emergency department.     Please follow-up with PCP if symptoms worsen or as scheduled.  If you have any questions or concerns, please do not hesitate to call the office at [phone].  I look forward to our next visit and until then take care and stay safe.  Regards,   Carollee Leitz, MD   [place] [facility]   Upper Respiratory Infection, Adult An upper respiratory infection (URI) affects the nose, throat, and upper airways that lead to the lungs. The most common type of URI is often called the common cold. URIs usually get better on their own, without medical treatment. What are the causes? A URI is caused by a germ (virus). You may catch these germs by: Breathing in droplets from an infected person's cough or sneeze. Touching something that has the germ on it (is contaminated) and then touching your mouth, nose, or eyes. What increases the risk? You are more likely to get a URI if: You are very young or very old. You have close contact with others, such as at work, school, or a health care facility. You smoke. You have long-term (chronic) heart or lung disease. You have a weakened disease-fighting system (immune system). You have nasal allergies or asthma. You have a lot of stress. You have poor nutrition. What are the signs or symptoms? Runny or  stuffy (congested) nose. Cough. Sneezing. Sore throat. Headache. Feeling tired (fatigue). Fever. Not wanting to eat as much as usual. Pain in your forehead, behind your eyes, and over your cheekbones (sinus pain). Muscle aches. Redness or irritation of the eyes. Pressure in the ears or face. How is this treated? URIs usually get better on their own within 7-10 days. Medicines cannot cure URIs, but your doctor may recommend certain medicines to help relieve symptoms, such as: Over-the-counter cold medicines. Medicines to reduce coughing (cough suppressants). Coughing is a type of defense against infection that helps to clear the nose, throat, windpipe, and lungs (respiratory system). Take these medicines only as told by your doctor. Medicines to lower your fever. Follow these instructions at home: Activity Rest as needed. If you have a fever, stay home from work or school until your fever is gone, or until your doctor says you may return to work or school. You should stay home until you cannot spread the infection anymore (you are not contagious). Your doctor may have you wear a face mask so you have less risk of spreading the infection. Relieving symptoms Rinse your mouth often with salt water. To make salt water, dissolve -1 tsp (3-6 g) of salt in 1 cup (237 mL) of warm water. Use a cool-mist humidifier to add moisture to the air. This can help you breathe more easily. Eating and drinking  Drink enough fluid to keep your pee (urine) pale yellow. Eat soups and  other clear broths. General instructions  Take over-the-counter and prescription medicines only as told by your doctor. Do not smoke or use any products that contain nicotine or tobacco. If you need help quitting, ask your doctor. Avoid being where people are smoking (avoid secondhand smoke). Stay up to date on all your shots (immunizations), and get the flu shot every year. Keep all follow-up visits. How to prevent the  spread of infection to others  Wash your hands with soap and water for at least 20 seconds. If you cannot use soap and water, use hand sanitizer. Avoid touching your mouth, face, eyes, or nose. Cough or sneeze into a tissue or your sleeve or elbow. Do not cough or sneeze into your hand or into the air. Contact a doctor if: You are getting worse, not better. You have any of these: A fever or chills. Brown or red mucus in your nose. Yellow or brown fluid (discharge)coming from your nose. Pain in your face, especially when you bend forward. Swollen neck glands. Pain when you swallow. White areas in the back of your throat. Get help right away if: You have shortness of breath that gets worse. You have very bad or constant: Headache. Ear pain. Pain in your forehead, behind your eyes, and over your cheekbones (sinus pain). Chest pain. You have long-lasting (chronic) lung disease along with any of these: Making high-pitched whistling sounds when you breathe, most often when you breathe out (wheezing). Long-lasting cough (more than 14 days). Coughing up blood. A change in your usual mucus. You have a stiff neck. You have changes in your: Vision. Hearing. Thinking. Mood. These symptoms may be an emergency. Get help right away. Call 911. Do not wait to see if the symptoms will go away. Do not drive yourself to the hospital. Summary An upper respiratory infection (URI) is caused by a germ (virus). The most common type of URI is often called the common cold. URIs usually get better within 7-10 days. Take over-the-counter and prescription medicines only as told by your doctor. This information is not intended to replace advice given to you by your health care provider. Make sure you discuss any questions you have with your health care provider. Document Revised: 02/12/2021 Document Reviewed: 02/12/2021 Elsevier Patient Education  [employer].

## 2022-05-13 NOTE — Progress Notes (Signed)
[facility] Tele[facility]icine Visit  Patient consented to have virtual visit and was identified by name and date of birth. Method of visit: Video  Encounter participants: Patient: Misty Jones - located at home Provider: Carollee Leitz - located at office Others (if applicable): none  Chief Complaint: chest congestion  HPI:  Patient reports chest congestion, sore throat and ear pain x 3 days. Throat pain and ear pain have since resolved.  Continues to have runny nose.  Taking Robitussin every 4 hours which has been helping.  Had slight headache today that resolved with single dose of Tylenol.  Home COVID test negative.  Had Flu vaccine last week.  COVID vaccines UTD.  ROS: per HPI  Pertinent PMHx:  HTN GERD DM Type 2  Exam:  There were no vitals taken for this visit.  Respiratory: Speaking in full sentences.  No increased work of breathing  Assessment/Plan:  URI (upper respiratory infection) Likely viral URI given improving symptoms with Robitussin and lozenges.  In no acute respiratory distress and is afebrile. Home COVID test Negative. -Continue symptom management. -Continue to stay hydrated  -Continue Robitussin every 4-6 hours.  This can cause dizziness, drowsiness and headaches so would use only if needed -Can take Tylenol 500 mg three times a day as needed -Rest as needed but continue to remain active as much as you can without excess fatigue. -If you have worsening symptoms, especially difficulty breathing please call 911 or have someone take you to the emergency department.   -Follow up with PCP as scheduled.

## 2022-05-20 ENCOUNTER — Ambulatory Visit
Admission: RE | Admit: 2022-05-20 | Discharge: 2022-05-20 | Disposition: A | Discharge status: Home / Self Care | Payer: Medicare Other | Source: Ambulatory Visit | Attending: Internal Medicine | Admitting: Internal Medicine

## 2022-05-20 DIAGNOSIS — Z1231 Encounter for screening mammogram for malignant neoplasm of breast: Secondary | ICD-10-CM | POA: Diagnosis not present

## 2022-05-21 ENCOUNTER — Ambulatory Visit (INDEPENDENT_AMBULATORY_CARE_PROVIDER_SITE_OTHER): Payer: Medicare Other | Admitting: Podiatry

## 2022-05-21 ENCOUNTER — Other Ambulatory Visit (HOSPITAL_COMMUNITY): Payer: Self-pay

## 2022-05-21 DIAGNOSIS — M79675 Pain in left toe(s): Secondary | ICD-10-CM

## 2022-05-21 DIAGNOSIS — M79674 Pain in right toe(s): Secondary | ICD-10-CM

## 2022-05-21 DIAGNOSIS — B351 Tinea unguium: Secondary | ICD-10-CM

## 2022-05-23 ENCOUNTER — Encounter: Payer: Self-pay | Admitting: Family Medicine

## 2022-05-23 DIAGNOSIS — J069 Acute upper respiratory infection, unspecified: Secondary | ICD-10-CM | POA: Insufficient documentation

## 2022-05-23 NOTE — Assessment & Plan Note (Signed)
Likely viral URI given improving symptoms with Robitussin and lozenges.  In no acute respiratory distress and is afebrile. Home COVID test Negative. -Continue symptom management. -Continue to stay hydrated  -Continue Robitussin every 4-6 hours.  This can cause dizziness, drowsiness and headaches so would use only if needed -Can take Tylenol 500 mg three times a day as needed -Rest as needed but continue to remain active as much as you can without excess fatigue. -If you have worsening symptoms, especially difficulty breathing please call 911 or have someone take you to the emergency department.   -Follow up with PCP as scheduled.

## 2022-05-25 ENCOUNTER — Other Ambulatory Visit: Payer: Medicare Other | Admitting: Urology

## 2022-05-28 ENCOUNTER — Encounter: Payer: Medicare Other | Admitting: Dermatology

## 2022-05-28 NOTE — Progress Notes (Signed)
  Subjective:  Patient ID: Misty Jones, female    DOB: 07-Nov-1940,  MRN: [id number]  Chief Complaint  Patient presents with   Nail Problem    Nail trim    81 y.o. female returns for the above complaint.  Patient presents with thickened elongated dystrophic toenails x10 mild pain on palpation she is not able to do it herself she would like me to do it.  Denies any other acute complaints.  Objective:  There were no vitals filed for this visit. Podiatric Exam: Vascular: dorsalis pedis and posterior tibial pulses are palpable bilateral. Capillary return is immediate. Temperature gradient is WNL. Skin turgor WNL  Sensorium: Normal Semmes Weinstein monofilament test. Normal tactile sensation bilaterally. Nail Exam: Pt has thick disfigured discolored nails with subungual debris noted bilateral entire nail hallux through fifth toenails.  Pain on palpation to the nails. Ulcer Exam: There is no evidence of ulcer or pre-ulcerative changes or infection. Orthopedic Exam: Muscle tone and strength are WNL. No limitations in general ROM. No crepitus or effusions noted.  Skin: No Porokeratosis. No infection or ulcers    Assessment & Plan:   1. Pain due to onychomycosis of toenails of both feet     Patient was evaluated and treated and all questions answered.  Onychomycosis with pain  -Nails palliatively debrided as below. -Educated on self-care  Procedure: Nail Debridement Rationale: pain  Type of Debridement: manual, sharp debridement. Instrumentation: Nail nipper, rotary burr. Number of Nails: 10  Procedures and Treatment: Consent by patient was obtained for treatment procedures. The patient understood the discussion of treatment and procedures well. All questions were answered thoroughly reviewed. Debridement of mycotic and hypertrophic toenails, 1 through 5 bilateral and clearing of subungual debris. No ulceration, no infection noted.  Return Visit-Office Procedure: Patient instructed to  return to the office for a follow up visit 3 months for continued evaluation and treatment.  Boneta Lucks, DPM    No follow-ups on file.

## 2022-06-12 DIAGNOSIS — H04123 Dry eye syndrome of bilateral lacrimal glands: Secondary | ICD-10-CM | POA: Diagnosis not present

## 2022-06-12 DIAGNOSIS — H353124 Nonexudative age-related macular degeneration, left eye, advanced atrophic with subfoveal involvement: Secondary | ICD-10-CM | POA: Diagnosis not present

## 2022-06-12 DIAGNOSIS — H353211 Exudative age-related macular degeneration, right eye, with active choroidal neovascularization: Secondary | ICD-10-CM | POA: Diagnosis not present

## 2022-06-15 ENCOUNTER — Encounter: Payer: Self-pay | Admitting: Internal Medicine

## 2022-06-15 NOTE — Telephone Encounter (Signed)
FYI Pt sched w/ Dr Volanda Napoleon tomorrow 1140am

## 2022-06-16 ENCOUNTER — Ambulatory Visit (INDEPENDENT_AMBULATORY_CARE_PROVIDER_SITE_OTHER): Payer: Medicare Other | Admitting: Family Medicine

## 2022-06-16 ENCOUNTER — Encounter: Payer: Self-pay | Admitting: Family Medicine

## 2022-06-16 VITALS — BP 130/80 | HR 87 | Temp 98.5°F | Ht 64.0 in | Wt 213.2 lb

## 2022-06-16 DIAGNOSIS — H66001 Acute suppurative otitis media without spontaneous rupture of ear drum, right ear: Secondary | ICD-10-CM | POA: Diagnosis not present

## 2022-06-16 DIAGNOSIS — H60502 Unspecified acute noninfective otitis externa, left ear: Secondary | ICD-10-CM

## 2022-06-16 DIAGNOSIS — H6121 Impacted cerumen, right ear: Secondary | ICD-10-CM

## 2022-06-16 DIAGNOSIS — H6122 Impacted cerumen, left ear: Secondary | ICD-10-CM

## 2022-06-16 DIAGNOSIS — H6120 Impacted cerumen, unspecified ear: Secondary | ICD-10-CM | POA: Insufficient documentation

## 2022-06-16 HISTORY — DX: Acute suppurative otitis media without spontaneous rupture of ear drum, right ear: H66.001

## 2022-06-16 MED ORDER — CEFDINIR 300 MG PO CAPS: 300.0000 mg | ORAL_CAPSULE | Freq: Two times a day (BID) | ORAL | 0 refills | Status: AC

## 2022-06-16 NOTE — Assessment & Plan Note (Addendum)
Recent URI.  Acute right ear pain with TM retraction and erythema.

## 2022-06-16 NOTE — Assessment & Plan Note (Deleted)
Ceruminosis is noted.  Verbal consent obtained. Wax is removed by syringing and manual debridement using ear curette.  Instructions for home care to prevent wax buildup are given.

## 2022-06-16 NOTE — Progress Notes (Addendum)
   SUBJECTIVE:   Chief Complaint  Patient presents with   Acute Visit    Fluid in ears   HPI Bilateral ear pain, right greater than left.  Ongoing for 7 days.  Recent URI x 2.  Feels like fluid in both ears, echo sounds and aggravating. No decrease in hearing, tinnitus, ear discharge, fevers.  Had episode of dizziness last night that self resolved. Used heat to help with pain. Dry cough but had episode of green mucus.  Hydrating well.  Appetite good.  No recent sick contacts.   PERTINENT PMH / PSH: Chronic Sinuses  OBJECTIVE:  BP 130/80 (BP Location: Right Arm, Patient Position: Sitting, Cuff Size: Normal)   Pulse 87   Temp 98.5 F (36.9 C) (Oral)   Ht '5\' 4"'$  (1.626 m)   Wt 213 lb 3.2 oz (96.7 kg)   SpO2 99%   BMI 36.60 kg/m    Physical Exam Vitals reviewed.  Constitutional:      General: She is not in acute distress.    Appearance: Normal appearance. She is not ill-appearing, toxic-appearing or diaphoretic.  HENT:     Right Ear: Ear canal and external ear normal. Tenderness present. A middle ear effusion is present. There is no impacted cerumen. No foreign body. No mastoid tenderness. Tympanic membrane is erythematous and bulging.     Left Ear: No tenderness. There is impacted cerumen.     Nose: Nose normal. No congestion or rhinorrhea.     Mouth/Throat:     Mouth: Mucous membranes are moist.     Pharynx: Oropharynx is clear. No oropharyngeal exudate or posterior oropharyngeal erythema.  Eyes:     General:        Right eye: No discharge.        Left eye: No discharge.     Conjunctiva/sclera: Conjunctivae normal.  Cardiovascular:     Rate and Rhythm: Normal rate and regular rhythm.     Heart sounds: Normal heart sounds.  Pulmonary:     Effort: Pulmonary effort is normal.     Breath sounds: Normal breath sounds.  Musculoskeletal:        General: Normal range of motion.  Lymphadenopathy:     Cervical: No cervical adenopathy.  Skin:    General: Skin is warm and dry.   Neurological:     General: No focal deficit present.     Mental Status: She is alert and oriented to person, place, and time. Mental status is at baseline.  Psychiatric:        Mood and Affect: Mood normal.        Behavior: Behavior normal.        Thought Content: Thought content normal.        Judgment: Judgment normal.     ASSESSMENT/PLAN:  Non-recurrent acute suppurative otitis media of right ear without spontaneous rupture of tympanic membrane Assessment & Plan: Recent URI.  Acute right ear pain with TM retraction and erythema.    Orders: -     Cefdinir; Take 1 capsule (300 mg total) by mouth 2 (two) times daily for 5 days.  Dispense: 10 capsule; Refill: 0  Impacted cerumen of left ear Assessment & Plan: Ceruminosis is noted.  Verbal consent obtained. Wax is removed by syringing and manual debridement using ear curette.  Procedure tolerated well.Instructions for home care to prevent wax buildup are given.     PDMP reviewed  Return if symptoms worsen or fail to improve.  Carollee Leitz, MD

## 2022-06-16 NOTE — Patient Instructions (Addendum)
It was a pleasure meeting you today. Thank you for allowing me to take part in your health care.  Our goals for today as we discussed include:  1. Acute otitis externa of left ear, unspecified type - cefdinir (OMNICEF) 300 MG capsule; Take 1 capsule (300 mg total) by mouth 2 (two) times daily for 5 days.  Dispense: 10 capsule; Refill: 0   If you have any questions or concerns, please do not hesitate to call the office at [phone].  I look forward to our next visit and until then take care and stay safe.  Regards,   Carollee Leitz, MD   [facility]

## 2022-06-21 ENCOUNTER — Other Ambulatory Visit: Payer: Self-pay | Admitting: Internal Medicine

## 2022-06-22 ENCOUNTER — Other Ambulatory Visit (INDEPENDENT_AMBULATORY_CARE_PROVIDER_SITE_OTHER): Payer: Medicare Other

## 2022-06-22 DIAGNOSIS — E7800 Pure hypercholesterolemia, unspecified: Secondary | ICD-10-CM

## 2022-06-22 LAB — HEPATIC FUNCTION PANEL
ALT: 13 U/L (ref 0–35)
AST: 13 U/L (ref 0–37)
Albumin: 3.8 g/dL (ref 3.5–5.2)
Alkaline Phosphatase: 72 U/L (ref 39–117)
Bilirubin, Direct: 0.1 mg/dL (ref 0.0–0.3)
Total Bilirubin: 0.3 mg/dL (ref 0.2–1.2)
Total Protein: 5.8 g/dL — ABNORMAL LOW (ref 6.0–8.3)

## 2022-06-25 ENCOUNTER — Encounter: Payer: Self-pay | Admitting: Family Medicine

## 2022-06-26 DIAGNOSIS — H353124 Nonexudative age-related macular degeneration, left eye, advanced atrophic with subfoveal involvement: Secondary | ICD-10-CM | POA: Diagnosis not present

## 2022-06-26 NOTE — Telephone Encounter (Signed)
Reviewed chart.  It appears she saw Dr Volanda Napoleon for ear issues.  Was given abx and ears cleaned.  Please call her and confirm no pain. Any other symptoms?  Nasal congestion, sinus pressure?  Can try nasacort nasal spray - 2 sprays each nostril one time per day.  Flush with saline nasal spray.  If acute issues, pain, etc - needs to be reevaluated.  I can place order for ENT and see if we can get an earlier appt.  I do feel if persistent problems and ENT evaluation is warranted.

## 2022-06-29 ENCOUNTER — Encounter: Payer: Self-pay | Admitting: Internal Medicine

## 2022-07-01 ENCOUNTER — Ambulatory Visit (INDEPENDENT_AMBULATORY_CARE_PROVIDER_SITE_OTHER): Payer: Medicare Other | Admitting: Family Medicine

## 2022-07-01 ENCOUNTER — Encounter: Payer: Self-pay | Admitting: Family Medicine

## 2022-07-01 VITALS — BP 140/70 | HR 85 | Temp 97.9°F | Ht 64.0 in | Wt 214.2 lb

## 2022-07-01 DIAGNOSIS — H65192 Other acute nonsuppurative otitis media, left ear: Secondary | ICD-10-CM | POA: Insufficient documentation

## 2022-07-01 DIAGNOSIS — H6122 Impacted cerumen, left ear: Secondary | ICD-10-CM | POA: Insufficient documentation

## 2022-07-01 HISTORY — DX: Impacted cerumen, left ear: H61.22

## 2022-07-01 MED ORDER — PREDNISONE 10 MG PO TABS: 10.0000 mg | ORAL_TABLET | Freq: Every day | ORAL | 0 refills | Status: DC

## 2022-07-01 MED ORDER — CEFDINIR 300 MG PO CAPS: 300.0000 mg | ORAL_CAPSULE | Freq: Two times a day (BID) | ORAL | 0 refills | Status: AC

## 2022-07-01 NOTE — Patient Instructions (Signed)
It was a pleasure meeting you today. Thank you for allowing me to take part in your health care.  Our goals for today as we discussed include:  Start Cefdinir 300 mg daily for 5 days Start Prednisone 10 mg daily for 5 days  Follow up with ENT as scheduled   If you have any questions or concerns, please do not hesitate to call the office at [phone].  I look forward to our next visit and until then take care and stay safe.  Regards,   Carollee Leitz, MD   [facility]

## 2022-07-01 NOTE — Assessment & Plan Note (Signed)
Ceruminosis is noted.  Verbal consent obtained. Wax is removed by syringing and manual debridement using ear curette.  Instructions for home care to prevent wax buildup are given.

## 2022-07-01 NOTE — Assessment & Plan Note (Addendum)
Recent URI.  Continues to have feeling of fullness, now with decreased hearing in left ear.  Repeat cefdinir 300 mg daily for 5 days Prednisone 10 mg daily for 5 days Recommend ENT evaluation.  Patient to schedule appointment.

## 2022-07-01 NOTE — Progress Notes (Signed)
   SUBJECTIVE:   Chief Complaint  Patient presents with   Acute Visit    Ear clogged (both)   HPI Patient presents to clinic for fullness in ears.  She reports having completed no recent antibiotics for a right otitis media which is resolved.  She reports having a feeling of fluid in her left ear and decreased hearing.  Denies any fevers, dizziness, headaches ear discharge or pain.  No changes in speech or balance.  Had an appointment at ENT but canceled recently.  Endorses increased nasal discharge especially at night.  Started Flonase spray daily 4 days ago with not much improvement.  PERTINENT PMH / PSH: Chronic sinusitis.  OBJECTIVE:  BP (!) 140/70   Pulse 85   Temp 97.9 F (36.6 C) (Oral)   Ht '5\' 4"'$  (1.626 m)   Wt 214 lb 3.2 oz (97.2 kg)   SpO2 96%   BMI 36.77 kg/m    Physical Exam Vitals reviewed.  Constitutional:      General: She is not in acute distress.    Appearance: She is not ill-appearing.  HENT:     Head: Normocephalic.     Right Ear: Ear canal and external ear normal. No tenderness. No middle ear effusion. There is no impacted cerumen.     Left Ear: Ear canal and external ear normal. Decreased hearing noted. No drainage, swelling or tenderness. A middle ear effusion is present. No mastoid tenderness. Tympanic membrane is bulging.     Nose: Nose normal.  Eyes:     Conjunctiva/sclera: Conjunctivae normal.  Cardiovascular:     Rate and Rhythm: Normal rate.  Pulmonary:     Effort: Pulmonary effort is normal.  Musculoskeletal:     Cervical back: Normal range of motion.  Neurological:     Mental Status: She is alert and oriented to person, place, and time. Mental status is at baseline.  Psychiatric:        Mood and Affect: Mood normal.        Behavior: Behavior normal.        Thought Content: Thought content normal.        Judgment: Judgment normal.     ASSESSMENT/PLAN:  Acute otitis media with effusion of left ear Assessment & Plan: Recent URI.   Continues to have feeling of fullness, now with decreased hearing in left ear.  Repeat cefdinir 300 mg daily for 5 days Prednisone 10 mg daily for 5 days Recommend ENT evaluation.  Patient to schedule appointment.  Orders: -     Cefdinir; Take 1 capsule (300 mg total) by mouth 2 (two) times daily for 5 days.  Dispense: 10 capsule; Refill: 0 -     predniSONE; Take 1 tablet (10 mg total) by mouth daily with breakfast.  Dispense: 5 tablet; Refill: 0   PDMP reviewed  Return if symptoms worsen or fail to improve, for PCP.  Carollee Leitz, MD

## 2022-07-02 ENCOUNTER — Other Ambulatory Visit: Payer: Self-pay

## 2022-07-02 MED ORDER — CONTOUR TEST VI STRP: ORAL_STRIP | 2 refills | Status: DC

## 2022-07-07 ENCOUNTER — Other Ambulatory Visit: Payer: Self-pay | Admitting: Internal Medicine

## 2022-07-08 DIAGNOSIS — H903 Sensorineural hearing loss, bilateral: Secondary | ICD-10-CM | POA: Diagnosis not present

## 2022-07-08 DIAGNOSIS — H6983 Other specified disorders of Eustachian tube, bilateral: Secondary | ICD-10-CM | POA: Diagnosis not present

## 2022-08-10 ENCOUNTER — Other Ambulatory Visit (INDEPENDENT_AMBULATORY_CARE_PROVIDER_SITE_OTHER): Payer: Medicare Other

## 2022-08-10 DIAGNOSIS — E1165 Type 2 diabetes mellitus with hyperglycemia: Secondary | ICD-10-CM | POA: Diagnosis not present

## 2022-08-10 DIAGNOSIS — I10 Essential (primary) hypertension: Secondary | ICD-10-CM

## 2022-08-10 DIAGNOSIS — E7800 Pure hypercholesterolemia, unspecified: Secondary | ICD-10-CM

## 2022-08-10 LAB — LIPID PANEL
Cholesterol: 259 mg/dL — ABNORMAL HIGH (ref 0–200)
HDL: 47.5 mg/dL (ref >39.00)
LDL Cholesterol: 174 mg/dL — ABNORMAL HIGH (ref 0–99)
NonHDL: 211.89
Total CHOL/HDL Ratio: 5
Triglycerides: 188 mg/dL — ABNORMAL HIGH (ref 0.0–149.0)
VLDL: 37.6 mg/dL (ref 0.0–40.0)

## 2022-08-10 LAB — HEPATIC FUNCTION PANEL
ALT: 35 U/L (ref 0–35)
AST: 22 U/L (ref 0–37)
Albumin: 4 g/dL (ref 3.5–5.2)
Alkaline Phosphatase: 88 U/L (ref 39–117)
Bilirubin, Direct: 0.1 mg/dL (ref 0.0–0.3)
Total Bilirubin: 0.6 mg/dL (ref 0.2–1.2)
Total Protein: 6.1 g/dL (ref 6.0–8.3)

## 2022-08-10 LAB — BASIC METABOLIC PANEL
BUN: 21 mg/dL (ref 6–23)
CO2: 33 mEq/L — ABNORMAL HIGH (ref 19–32)
Calcium: 8.9 mg/dL (ref 8.4–10.5)
Chloride: 99 mEq/L (ref 96–112)
Creatinine, Ser: 0.95 mg/dL (ref 0.40–1.20)
GFR: 55.89 mL/min — ABNORMAL LOW (ref >60.00)
Glucose, Bld: 154 mg/dL — ABNORMAL HIGH (ref 70–99)
Potassium: 4.7 mEq/L (ref 3.5–5.1)
Sodium: 140 mEq/L (ref 135–145)

## 2022-08-10 LAB — HEMOGLOBIN A1C: Hgb A1c MFr Bld: 7.5 % — ABNORMAL HIGH (ref 4.6–6.5)

## 2022-08-11 ENCOUNTER — Other Ambulatory Visit: Payer: Self-pay | Admitting: Internal Medicine

## 2022-08-11 ENCOUNTER — Encounter: Payer: Self-pay | Admitting: Internal Medicine

## 2022-08-11 ENCOUNTER — Ambulatory Visit (INDEPENDENT_AMBULATORY_CARE_PROVIDER_SITE_OTHER): Payer: Medicare Other | Admitting: Internal Medicine

## 2022-08-11 VITALS — BP 122/74 | HR 100 | Temp 98.0°F | Resp 16 | Ht 64.0 in | Wt 214.0 lb

## 2022-08-11 DIAGNOSIS — E1165 Type 2 diabetes mellitus with hyperglycemia: Secondary | ICD-10-CM

## 2022-08-11 DIAGNOSIS — R059 Cough, unspecified: Secondary | ICD-10-CM | POA: Diagnosis not present

## 2022-08-11 DIAGNOSIS — I10 Essential (primary) hypertension: Secondary | ICD-10-CM | POA: Diagnosis not present

## 2022-08-11 DIAGNOSIS — I700 Atherosclerosis of aorta: Secondary | ICD-10-CM

## 2022-08-11 DIAGNOSIS — E7800 Pure hypercholesterolemia, unspecified: Secondary | ICD-10-CM

## 2022-08-11 DIAGNOSIS — G720 Drug-induced myopathy: Secondary | ICD-10-CM | POA: Diagnosis not present

## 2022-08-11 DIAGNOSIS — T466X5A Adverse effect of antihyperlipidemic and antiarteriosclerotic drugs, initial encounter: Secondary | ICD-10-CM

## 2022-08-11 DIAGNOSIS — K219 Gastro-esophageal reflux disease without esophagitis: Secondary | ICD-10-CM | POA: Diagnosis not present

## 2022-08-11 MED ORDER — GLIPIZIDE ER 2.5 MG PO TB24: 2.5000 mg | ORAL_TABLET | Freq: Every day | ORAL | 1 refills | Status: DC

## 2022-08-11 MED ORDER — ALBUTEROL SULFATE HFA 108 (90 BASE) MCG/ACT IN AERS: 2.0000 | INHALATION_SPRAY | Freq: Four times a day (QID) | RESPIRATORY_TRACT | 1 refills | Status: DC | PRN

## 2022-08-11 MED ORDER — PREDNISONE 10 MG PO TABS: ORAL_TABLET | ORAL | 0 refills | Status: DC

## 2022-08-11 MED ORDER — AZITHROMYCIN 250 MG PO TABS: ORAL_TABLET | ORAL | 0 refills | Status: AC

## 2022-08-11 NOTE — Patient Instructions (Signed)
Continue robitussin DM and nasacort.   Take a probiotic daily while you are on the antibiotic and for two weeks after completing the antibiotic.

## 2022-08-11 NOTE — Progress Notes (Signed)
Subjective:    Patient ID: Misty Jones, female    DOB: [birth year], 82 y.o.   MRN: [id number]  Patient here for  Chief Complaint  Patient presents with   Medical Management of Chronic Issues   Cough   Hyperlipidemia   Diabetes    HPI Here to follow up regarding her blood sugar, cholesterol and blood pressure.  Was evaluated 10/18 - diagnosed with viral URI.  Treated with robitussin and tylenol.  Evaluated 06/16/22 - diagnosed with URI/OM.  Treated with omnicef and wax removal.  Continued to have fullness - ears.  Treated with omnicef.  Prescribed prednisone.  Reports that Sunday one week ago - noticed increased drainage.  Cough productive - clear mucus.  No nausea or vomiting.  No diarrhea.  Robitussin DM.  Using albuterol prn.  AM sugars 130-150.     Past Medical History:  Diagnosis Date   B12 deficiency    Chicken pox    Diabetes mellitus ([place])    GERD (gastroesophageal reflux disease)    History of diverticulitis of colon    History of dysplastic nevus 07/05/2018   left mid back shv - with moderate atypia   Hypercholesterolemia    Hypertension    Impacted cerumen of left ear 07/01/2022   Nasal drainage    Non-recurrent acute suppurative otitis media of right ear without spontaneous rupture of tympanic membrane 06/16/2022   Past Surgical History:  Procedure Laterality Date   ABDOMINAL HYSTERECTOMY  [place]   ruptured disc, Dr Hardin [facility]us [facility])   [place]   COLONOSCOPY WITH PROPOFOL N/A 06/29/2016   Procedure: COLONOSCOPY WITH PROPOFOL;  Surgeon: Manya Silvas, MD;  Location: [place];  Service: Endoscopy;  Laterality: N/A;   ESOPHAGOGASTRODUODENOSCOPY (EGD) WITH PROPOFOL N/A 12/13/2017   Procedure: ESOPHAGOGASTRODUODENOSCOPY (EGD) WITH PROPOFOL;  Surgeon: Manya Silvas, MD;  Location[facility] ENDOSCOPY;  Service: Endoscopy;  Laterality: N/A;   TONSILLECTOMY     Family History  Problem Relation Age  of Onset   Hypertension Mother    Cancer Mother        Mouth cancer   Heart disease Father        myocardial infarction   Raynaud syndrome Sister    Asthma Sister    Congenital heart disease Sister    Thyroid disease Sister    Thyroid cancer Daughter    Breast cancer Neg Hx    Colon cancer Neg Hx    Social History   Socioeconomic History   Marital status: Divorced    Spouse name: Not on file   Number of children: 2   Years of education: Not on file   Highest education level: Not on file  Occupational History   Occupation: retired  Tobacco Use   Smoking status: Former    Packs/day: 0.50    Years: 20.00    Total pack years: 10.00    Types: Cigarettes    Quit date: 07/26/1984    Years since quitting: 38.0   Smokeless tobacco: Never   Tobacco comments:    quit smoking years ago  Vaping Use   Vaping Use: Never used  Substance and Sexual Activity   Alcohol use: No    Alcohol/week: 0.0 standard drinks of alcohol   Drug use: No   Sexual activity: Never  Other Topics Concern   Not on file  Social History Narrative   Not on file   Social Determinants  of Health   Financial Resource Strain: Low Risk  (05/07/2022)   Overall Financial Resource Strain (CARDIA)    Difficulty of Paying Living Expenses: Not hard at all  Food Insecurity: No Food Insecurity (05/07/2022)   Hunger Vital Sign    Worried About Running Out of Food in the Last Year: Never true    Ran Out of Food in the Last Year: Never true  Transportation Needs: No Transportation Needs (05/07/2022)   PRAPARE - Hydrologist (Medical): No    Lack of Transportation (Non-Medical): No  Physical Activity: Not on file  Stress: No Stress Concern Present (05/07/2022)   [place]    Feeling of Stress : Not at all  Social Connections: Unknown (05/07/2022)   Social Connection and Isolation Panel [NHANES]    Frequency of  Communication with Friends and Family: More than three times a week    Frequency of Social Gatherings with Friends and Family: Not on file    Attends Religious Services: Not on file    Active Member of Clubs or Organizations: Not on file    Attends Archivist Meetings: Not on file    Marital Status: Not on file     Review of Systems  Constitutional:  Negative for appetite change and fever.  HENT:  Positive for congestion and postnasal drip. Negative for sinus pressure.   Respiratory:  Positive for cough. Negative for chest tightness and shortness of breath.   Cardiovascular:  Negative for chest pain, palpitations and leg swelling.  Gastrointestinal:  Negative for abdominal pain, diarrhea, nausea and vomiting.  Genitourinary:  Negative for difficulty urinating and dysuria.  Musculoskeletal:  Negative for joint swelling and myalgias.  Skin:  Negative for color change and rash.  Neurological:  Negative for dizziness and headaches.  Psychiatric/Behavioral:  Negative for agitation and dysphoric mood.        Objective:     BP 122/74 (BP Location: Left Arm, Patient Position: Sitting, Cuff Size: Normal)   Pulse 100   Temp 98 F (36.7 C)   Resp 16   Ht '5\' 4"'$  (1.626 m)   Wt 214 lb (97.1 kg)   SpO2 98%   BMI 36.73 kg/m  Wt Readings from Last 3 Encounters:  08/11/22 214 lb (97.1 kg)  07/01/22 214 lb 3.2 oz (97.2 kg)  06/16/22 213 lb 3.2 oz (96.7 kg)    Physical Exam Vitals reviewed.  Constitutional:      General: She is not in acute distress.    Appearance: Normal appearance.  HENT:     Head: Normocephalic and atraumatic.     Right Ear: External ear normal.     Left Ear: External ear normal.  Eyes:     General: No scleral icterus.       Right eye: No discharge.        Left eye: No discharge.     Conjunctiva/sclera: Conjunctivae normal.  Neck:     Thyroid: No thyromegaly.  Cardiovascular:     Rate and Rhythm: Normal rate and regular rhythm.  Pulmonary:      Effort: No respiratory distress.     Breath sounds: Normal breath sounds. No wheezing.  Abdominal:     General: Bowel sounds are normal.     Palpations: Abdomen is soft.     Tenderness: There is no abdominal tenderness.  Musculoskeletal:        General: No swelling or tenderness.  Cervical back: Neck supple. No tenderness.  Lymphadenopathy:     Cervical: No cervical adenopathy.  Skin:    Findings: No erythema or rash.  Neurological:     Mental Status: She is alert.  Psychiatric:        Mood and Affect: Mood normal.        Behavior: Behavior normal.      Outpatient Encounter Medications as of 08/11/2022  Medication Sig   albuterol (VENTOLIN HFA) 108 (90 Base) MCG/ACT inhaler Inhale 2 puffs into the lungs every 6 (six) hours as needed for wheezing or shortness of breath.   azithromycin (ZITHROMAX) 250 MG tablet Take 2 tablets on day 1, then 1 tablet daily on days 2 through 5   predniSONE (DELTASONE) 10 MG tablet Take 4 tablets x 1 day and then decrease by 1/2 tablet per day until down to zero mg.   aspirin 81 MG tablet Take 81 mg by mouth daily.   benazepril (LOTENSIN) 40 MG tablet Take 1 tablet by mouth once daily   cyanocobalamin (,VITAMIN B-12,) 1000 MCG/ML injection INJECT 1 ML INTRAMUSCULARLY ONCE EVERY MONTH   glucose blood (CONTOUR TEST) test strip USE 1 STRIP TO CHECK GLUCOSE ONCE DAILY DX  E11 9 DIABETES MELLITUS   hydrochlorothiazide (HYDRODIURIL) 25 MG tablet Take 1/2 (one-half) tablet by mouth once daily   Lancets MISC Check sugars once a day Dx: E11.9 (Contour Next)   lovastatin (MEVACOR) 10 MG tablet Take one tablet on Monday and Thursday.   Multiple Vitamins-Minerals (PRESERVISION AREDS 2 PO) Take by mouth.   niacin 500 MG tablet Take 500 mg by mouth at bedtime.   nystatin cream (MYCOSTATIN) Apply 1 application. topically 2 (two) times daily.   omeprazole (PRILOSEC) 20 MG capsule Take 20 mg by mouth daily.   Syringe/Needle, Disp, (SYRINGE 3CC/25GX1") 25G X 1" 3 ML  MISC Use as directed to administer b12 injections.   tacrolimus (PROTOPIC) 0.1 % ointment    [DISCONTINUED] glipiZIDE (GLUCOTROL XL) 2.5 MG 24 hr tablet Take 1 tablet (2.5 mg total) by mouth daily with breakfast.   [DISCONTINUED] glipiZIDE (GLUCOTROL XL) 2.5 MG 24 hr tablet Take 1 tablet (2.5 mg total) by mouth daily with breakfast.   [DISCONTINUED] predniSONE (DELTASONE) 10 MG tablet Take 1 tablet (10 mg total) by mouth daily with breakfast.   No facility-administered encounter medications on file as of 08/11/2022.     Lab Results  Component Value Date   WBC 7.3 12/31/2021   HGB 12.3 12/31/2021   HCT 37.8 12/31/2021   PLT 318.0 12/31/2021   GLUCOSE 154 (H) 08/10/2022   CHOL 259 (H) 08/10/2022   TRIG 188.0 (H) 08/10/2022   HDL 47.50 08/10/2022   LDLDIRECT 150.0 12/31/2021   LDLCALC 174 (H) 08/10/2022   ALT 35 08/10/2022   AST 22 08/10/2022   NA 140 08/10/2022   K 4.7 08/10/2022   CL 99 08/10/2022   CREATININE 0.95 08/10/2022   BUN 21 08/10/2022   CO2 33 (H) 08/10/2022   TSH 3.34 05/04/2022   INR 0.9 04/03/2012   HGBA1C 7.5 (H) 08/10/2022   MICROALBUR <0.7 04/01/2022    MM 3D SCREEN BREAST BILATERAL  Result Date: 05/22/2022 CLINICAL DATA:  Screening. EXAM: DIGITAL SCREENING BILATERAL MAMMOGRAM WITH TOMOSYNTHESIS AND CAD TECHNIQUE: Bilateral screening digital craniocaudal and mediolateral oblique mammograms were obtained. Bilateral screening digital breast tomosynthesis was performed. The images were evaluated with computer-aided detection. COMPARISON:  Previous exam(s). ACR Breast Density Category b: There are scattered areas of fibroglandular density.  FINDINGS: There are no findings suspicious for malignancy. IMPRESSION: No mammographic evidence of malignancy. A result letter of this screening mammogram will be mailed directly to the patient. RECOMMENDATION: Screening mammogram in one year. (Code:[id number]-B-01Y) BI-RADS CATEGORY  1: Negative. Electronically Signed   By: Ammie Ferrier M.D.   On: 05/22/2022 12:24       Assessment & Plan:  Cough, unspecified type Assessment & Plan: Cough and congestion as outlined.  Also with increased nasal congestion.  Saline nasal spray and nasacort nasal spray as directed.  Robitussin DM.  Prednisone taper as directed.  Albuterol inhaler if needed. Also treat with zpak, given concern regarding overlying bacterial infection - given worsening of symptoms this past week.  Follow.  Call with update.    Type 2 diabetes mellitus with hyperglycemia, without long-term current use of insulin ([place]) Assessment & Plan: Since being off metformin, diarrhea has resolved.  Held on trying GLP 1 agonist, given concern regarding previous GI issues.  Did not tolerate jardiance.  a1c elevated.  On glipizide XL 2.'5mg'$  q day.  Follow sugars.  Low carb diet and exercise.  Hold on increasing medication.  Follow.   Orders: -     Hemoglobin A1c; Future -     Basic metabolic panel; Future  Aortic atherosclerosis (HCC) Assessment & Plan: Intolerant to statins and zetia.  Off repatha.  Low cholesterol diet and exercise.  Follow lipid panel and liver function tests.   Lab Results  Component Value Date   CHOL 259 (H) 08/10/2022   HDL 47.50 08/10/2022   LDLCALC 174 (H) 08/10/2022   LDLDIRECT 150.0 12/31/2021   TRIG 188.0 (H) 08/10/2022   CHOLHDL 5 08/10/2022     Gastroesophageal reflux disease, unspecified whether esophagitis present Assessment & Plan: Continue prilosec. Follow.    Hypercholesterolemia Assessment & Plan: Intolerant to statins, zetia and repatha.  On no medication now. Did not tolerate lovastatin.  Low cholesterol diet and exercise.  Follow lipid panel and liver function tests.   Orders: -     Lipid panel; Future -     Hepatic function panel; Future -     CBC with Differential/Platelet; Future  Primary hypertension Assessment & Plan: Blood pressure has been doing well.  Continue benazepril and hctz.  Follow pressures.   Follow metabolic panel.    Statin myopathy Assessment & Plan: Intolerant to statin medication as outlined.    Other orders -     Azithromycin; Take 2 tablets on day 1, then 1 tablet daily on days 2 through 5  Dispense: 6 tablet; Refill: 0 -     predniSONE; Take 4 tablets x 1 day and then decrease by 1/2 tablet per day until down to zero mg.  Dispense: 18 tablet; Refill: 0 -     Albuterol Sulfate HFA; Inhale 2 puffs into the lungs every 6 (six) hours as needed for wheezing or shortness of breath.  Dispense: 18 g; Refill: 1     [name], MD

## 2022-08-12 ENCOUNTER — Other Ambulatory Visit: Payer: Self-pay

## 2022-08-12 MED ORDER — GLIPIZIDE ER 2.5 MG PO TB24: 2.5000 mg | ORAL_TABLET | Freq: Every day | ORAL | 1 refills | Status: DC

## 2022-08-14 ENCOUNTER — Encounter: Payer: Self-pay | Admitting: Internal Medicine

## 2022-08-14 ENCOUNTER — Other Ambulatory Visit: Payer: Self-pay

## 2022-08-14 MED ORDER — GLIPIZIDE ER 2.5 MG PO TB24: 2.5000 mg | ORAL_TABLET | Freq: Every day | ORAL | 1 refills | Status: DC

## 2022-08-15 ENCOUNTER — Encounter: Payer: Self-pay | Admitting: Internal Medicine

## 2022-08-15 DIAGNOSIS — G720 Drug-induced myopathy: Secondary | ICD-10-CM | POA: Insufficient documentation

## 2022-08-15 NOTE — Assessment & Plan Note (Signed)
Cough and congestion as outlined.  Also with increased nasal congestion.  Saline nasal spray and nasacort nasal spray as directed.  Robitussin DM.  Prednisone taper as directed.  Albuterol inhaler if needed. Also treat with zpak, given concern regarding overlying bacterial infection - given worsening of symptoms this past week.  Follow.  Call with update.

## 2022-08-15 NOTE — Assessment & Plan Note (Signed)
Intolerant to statins, zetia and repatha.  On no medication now. Did not tolerate lovastatin.  Low cholesterol diet and exercise.  Follow lipid panel and liver function tests.  

## 2022-08-15 NOTE — Assessment & Plan Note (Signed)
Intolerant to statin medication as outlined.  

## 2022-08-15 NOTE — Assessment & Plan Note (Signed)
Intolerant to statins and zetia.  Off repatha.  Low cholesterol diet and exercise.  Follow lipid panel and liver function tests.   Lab Results  Component Value Date   CHOL 259 (H) 08/10/2022   HDL 47.50 08/10/2022   LDLCALC 174 (H) 08/10/2022   LDLDIRECT 150.0 12/31/2021   TRIG 188.0 (H) 08/10/2022   CHOLHDL 5 08/10/2022   

## 2022-08-15 NOTE — Assessment & Plan Note (Addendum)
Continue prilosec. Follow.  

## 2022-08-15 NOTE — Assessment & Plan Note (Signed)
Since being off metformin, diarrhea has resolved.  Held on trying GLP 1 agonist, given concern regarding previous GI issues.  Did not tolerate jardiance.  a1c elevated.  On glipizide XL 2.'5mg'$  q day.  Follow sugars.  Low carb diet and exercise.  Hold on increasing medication.  Follow.

## 2022-08-15 NOTE — Assessment & Plan Note (Signed)
Blood pressure has been doing well.  Continue benazepril and hctz.  Follow pressures.  Follow metabolic panel.

## 2022-08-19 ENCOUNTER — Encounter: Payer: Self-pay | Admitting: Internal Medicine

## 2022-08-19 NOTE — Telephone Encounter (Signed)
I do recommend using the inhaler.  Needs to keep [place] posted on how symptoms are doing.  If any acute change, needs to be seen.

## 2022-08-20 ENCOUNTER — Encounter: Payer: Self-pay | Admitting: Internal Medicine

## 2022-08-21 ENCOUNTER — Ambulatory Visit (INDEPENDENT_AMBULATORY_CARE_PROVIDER_SITE_OTHER): Payer: Medicare Other

## 2022-08-21 ENCOUNTER — Encounter: Payer: Self-pay | Admitting: Nurse Practitioner

## 2022-08-21 ENCOUNTER — Ambulatory Visit (INDEPENDENT_AMBULATORY_CARE_PROVIDER_SITE_OTHER): Payer: Medicare Other | Admitting: Nurse Practitioner

## 2022-08-21 VITALS — BP 138/68 | HR 103 | Temp 98.6°F | Ht 64.0 in | Wt 216.0 lb

## 2022-08-21 DIAGNOSIS — R059 Cough, unspecified: Secondary | ICD-10-CM | POA: Diagnosis not present

## 2022-08-21 DIAGNOSIS — R058 Other specified cough: Secondary | ICD-10-CM

## 2022-08-21 MED ORDER — BENZONATATE 100 MG PO CAPS: 200.0000 mg | ORAL_CAPSULE | Freq: Two times a day (BID) | ORAL | 0 refills | Status: DC | PRN

## 2022-08-21 NOTE — Patient Instructions (Addendum)
Started her on Benzonatate 100 mg twice a day as needed. Use albuterol inhaler every 4-6 hours.  Will call with the result of Chest X ray. Increase hydration, Use steam and inhaler.

## 2022-08-21 NOTE — Assessment & Plan Note (Signed)
Lungs clear on auscultation. Will do chest x-ray since the cough and congestion going on since November. Started her on benzonatate 100 mg twice a day as needed. Advised her to increase hydration, use humidifier and steam.

## 2022-08-21 NOTE — Progress Notes (Signed)
Established Patient Office Visit  Subjective:  Patient ID: Misty Jones, female    DOB: [birth year]  Age: 82 y.o. MRN: [id number]  CC:  Chief Complaint  Patient presents with   Wheezing    X 3 days started with sore throat and cough had inhaler today      HPI  Misty Jones presents for coughing and wheezing. She has  fourth episode of cough and wheezing since November. Last night she had a coughing spell but feels better this morning.  Denies fever, chest pain, headache. Have some chest soreness due to coughing and occasional SOB.   HPI   Past Medical History:  Diagnosis Date   B12 deficiency    Chicken pox    Diabetes mellitus ([place])    GERD (gastroesophageal reflux disease)    History of diverticulitis of colon    History of dysplastic nevus 07/05/2018   left mid back shv - with moderate atypia   Hypercholesterolemia    Hypertension    Impacted cerumen of left ear 07/01/2022   Nasal drainage    Non-recurrent acute suppurative otitis media of right ear without spontaneous rupture of tympanic membrane 06/16/2022    Past Surgical History:  Procedure Laterality Date   ABDOMINAL HYSTERECTOMY  [place]   ruptured disc, Dr Hardin Negus [facility])   [place]   COLONOSCOPY WITH PROPOFOL N/A 06/29/2016   Procedure: COLONOSCOPY WITH PROPOFOL;  Surgeon: Manya Silvas, MD;  Location: [place];  Service: Endoscopy;  Laterality: N/A;   ESOPHAGOGASTRODUODENOSCOPY (EGD) WITH PROPOFOL N/A 12/13/2017   Procedure: ESOPHAGOGASTRODUODENOSCOPY (EGD) WITH PROPOFOL;  Surgeon: Manya Silvas, MD;  Location: [facility] ENDOSCOPY;  Service: Endoscopy;  Laterality: N/A;   TONSILLECTOMY      Family History  Problem Relation Age of Onset   Hypertension Mother    Cancer Mother        Mouth cancer   Heart disease Father        myocardial infarction   Raynaud syndrome Sister    Asthma Sister    Congenital heart disease Sister     Thyroid disease Sister    Thyroid cancer Daughter    Breast cancer Neg Hx    Colon cancer Neg Hx     Social History   Socioeconomic History   Marital status: Divorced    Spouse name: Not on file   Number of children: 2   Years of education: Not on file   Highest education level: Not on file  Occupational History   Occupation: retired  Tobacco Use   Smoking status: Former    Packs/day: 0.50    Years: 20.00    Total pack years: 10.00    Types: Cigarettes    Quit date: 07/26/1984    Years since quitting: 38.0   Smokeless tobacco: Never   Tobacco comments:    quit smoking years ago  Vaping Use   Vaping Use: Never used  Substance and Sexual Activity   Alcohol use: No    Alcohol/week: 0.0 standard drinks of alcohol   Drug use: No   Sexual activity: Never  Other Topics Concern   Not on file  Social History Narrative   Not on file   Social Determinants of Health   Financial Resource Strain: Low Risk  (05/07/2022)   Overall Financial Resource Strain (CARDIA)    Difficulty of Paying Living Expenses: Not hard at all  Food Insecurity: No  Food Insecurity (05/07/2022)   Hunger Vital Sign    Worried About Running Out of Food in the Last Year: Never true    Ran Out of Food in the Last Year: Never true  Transportation Needs: No Transportation Needs (05/07/2022)   PRAPARE - Hydrologist (Medical): No    Lack of Transportation (Non-Medical): No  Physical Activity: Not on file  Stress: No Stress Concern Present (05/07/2022)   [place]    Feeling of Stress : Not at all  Social Connections: Unknown (05/07/2022)   Social Connection and Isolation Panel [NHANES]    Frequency of Communication with Friends and Family: More than three times a week    Frequency of Social Gatherings with Friends and Family: Not on file    Attends Religious Services: Not on file    Active Member of [place] or  Organizations: Not on file    Attends Archivist Meetings: Not on file    Marital Status: Not on file  Intimate Partner Violence: Not At Risk (05/07/2022)   Humiliation, Afraid, Rape, and Kick questionnaire    Fear of Current or Ex-Partner: No    Emotionally Abused: No    Physically Abused: No    Sexually Abused: No     Outpatient Medications Prior to Visit  Medication Sig Dispense Refill   albuterol (VENTOLIN HFA) 108 (90 Base) MCG/ACT inhaler Inhale 2 puffs into the lungs every 6 (six) hours as needed for wheezing or shortness of breath. 18 g 1   aspirin 81 MG tablet Take 81 mg by mouth daily.     benazepril (LOTENSIN) 40 MG tablet Take 1 tablet by mouth once daily 90 tablet 0   cyanocobalamin (,VITAMIN B-12,) 1000 MCG/ML injection INJECT 1 ML INTRAMUSCULARLY ONCE EVERY MONTH 30 mL 0   glipiZIDE (GLUCOTROL XL) 2.5 MG 24 hr tablet Take 1 tablet (2.5 mg total) by mouth daily with breakfast. 90 tablet 1   glucose blood (CONTOUR TEST) test strip USE 1 STRIP TO CHECK GLUCOSE ONCE DAILY DX  E11 9 DIABETES MELLITUS 50 each 2   hydrochlorothiazide (HYDRODIURIL) 25 MG tablet Take 1/2 (one-half) tablet by mouth once daily 90 tablet 1   Lancets MISC Check sugars once a day Dx: E11.9 (Contour Next) 100 each 3   Multiple Vitamins-Minerals (PRESERVISION AREDS 2 PO) Take by mouth.     niacin 500 MG tablet Take 500 mg by mouth at bedtime.     nystatin cream (MYCOSTATIN) Apply 1 application. topically 2 (two) times daily. 30 g 0   omeprazole (PRILOSEC) 20 MG capsule Take 20 mg by mouth daily.     Syringe/Needle, Disp, (SYRINGE 3CC/25GX1") 25G X 1" 3 ML MISC Use as directed to administer b12 injections. 50 each 1   lovastatin (MEVACOR) 10 MG tablet Take one tablet on Monday and Thursday. (Patient not taking: Reported on 08/21/2022) 30 tablet 2   predniSONE (DELTASONE) 10 MG tablet Take 4 tablets x 1 day and then decrease by 1/2 tablet per day until down to zero mg. (Patient not taking: Reported  on 08/21/2022) 18 tablet 0   tacrolimus (PROTOPIC) 0.1 % ointment  (Patient not taking: Reported on 08/21/2022)     No facility-administered medications prior to visit.    Allergies  Allergen Reactions   Augmentin [Amoxicillin-Pot Clavulanate] Other (See Comments)    vomiting   Protonix [Pantoprazole Sodium]     ROS Review of Systems  Constitutional:  Negative.   HENT:  Positive for congestion. Negative for ear discharge, facial swelling and sinus pressure.   Respiratory:  Positive for cough and wheezing.   Cardiovascular: Negative.   Gastrointestinal: Negative.   Neurological: Negative.  Negative for headaches.  Psychiatric/Behavioral: Negative.        Objective:    Physical Exam Constitutional:      Appearance: Normal appearance.  HENT:     Right Ear: Tympanic membrane normal.     Left Ear: Tympanic membrane normal.     Mouth/Throat:     Mouth: Mucous membranes are moist.  Cardiovascular:     Rate and Rhythm: Normal rate and regular rhythm.     Pulses: Normal pulses.     Heart sounds: Normal heart sounds.  Pulmonary:     Effort: Pulmonary effort is normal.     Breath sounds: Normal breath sounds.  Neurological:     General: No focal deficit present.     Mental Status: She is alert and oriented to person, place, and time. Mental status is at baseline.  Psychiatric:        Mood and Affect: Mood normal.        Behavior: Behavior normal.        Thought Content: Thought content normal.        Judgment: Judgment normal.     BP 138/68   Pulse (!) 103   Temp 98.6 F (37 C) (Oral)   Ht '5\' 4"'$  (1.626 m)   Wt 216 lb (98 kg)   SpO2 96%   BMI 37.08 kg/m  Wt Readings from Last 3 Encounters:  08/21/22 216 lb (98 kg)  08/11/22 214 lb (97.1 kg)  07/01/22 214 lb 3.2 oz (97.2 kg)     Health Maintenance  Topic Date Due   COVID-19 Vaccine (3 - Moderna risk series) 08/27/2022 (Originally 07/26/2020)   Zoster Vaccines- Shingrix (1 of 2) 11/10/2022 (Originally 05/02/1960)    OPHTHALMOLOGY EXAM  11/05/2022   HEMOGLOBIN A1C  02/08/2023   Diabetic kidney evaluation - Urine ACR  04/02/2023   FOOT EXAM  05/08/2023   Medicare Annual Wellness (AWV)  05/08/2023   MAMMOGRAM  05/21/2023   Diabetic kidney evaluation - eGFR measurement  08/11/2023   Pneumonia Vaccine 47+ Years old  Completed   INFLUENZA VACCINE  Completed   DEXA SCAN  Completed   HPV VACCINES  Aged Out   DTaP/Tdap/Td  Discontinued    There are no preventive care reminders to display for this patient.  Lab Results  Component Value Date   TSH 3.34 05/04/2022   Lab Results  Component Value Date   WBC 7.3 12/31/2021   HGB 12.3 12/31/2021   HCT 37.8 12/31/2021   MCV 90.7 12/31/2021   PLT 318.0 12/31/2021   Lab Results  Component Value Date   NA 140 08/10/2022   K 4.7 08/10/2022   CO2 33 (H) 08/10/2022   GLUCOSE 154 (H) 08/10/2022   BUN 21 08/10/2022   CREATININE 0.95 08/10/2022   BILITOT 0.6 08/10/2022   ALKPHOS 88 08/10/2022   AST 22 08/10/2022   ALT 35 08/10/2022   PROT 6.1 08/10/2022   ALBUMIN 4.0 08/10/2022   CALCIUM 8.9 08/10/2022   ANIONGAP 9 09/13/2016   GFR 55.89 (L) 08/10/2022   Lab Results  Component Value Date   CHOL 259 (H) 08/10/2022   Lab Results  Component Value Date   HDL 47.50 08/10/2022   Lab Results  Component Value Date   LDLCALC 174 (  H) 08/10/2022   Lab Results  Component Value Date   TRIG 188.0 (H) 08/10/2022   Lab Results  Component Value Date   CHOLHDL 5 08/10/2022   Lab Results  Component Value Date   HGBA1C 7.5 (H) 08/10/2022      Assessment & Plan:   Problem List Items Addressed This Visit       Other   Cough - Primary    Lungs clear on auscultation. Will do chest x-ray since the cough and congestion going on since November. Started her on benzonatate 100 mg twice a day as needed. Advised her to increase hydration, use humidifier and steam.      Relevant Orders   DG Chest 2 View     Meds ordered this encounter   Medications   benzonatate (TESSALON) 100 MG capsule    Sig: Take 2 capsules (200 mg total) by mouth 2 (two) times daily as needed for cough.    Dispense:  20 capsule    Refill:  0     Follow-up: No follow-ups on file.    Theresia Lo, NP

## 2022-08-27 ENCOUNTER — Ambulatory Visit: Payer: Medicare Other | Admitting: Podiatry

## 2022-08-27 ENCOUNTER — Encounter: Payer: Self-pay | Admitting: Internal Medicine

## 2022-08-27 NOTE — Telephone Encounter (Signed)
Appt tomorrow at 10:30

## 2022-08-27 NOTE — Telephone Encounter (Signed)
scheduled

## 2022-08-28 ENCOUNTER — Telehealth (INDEPENDENT_AMBULATORY_CARE_PROVIDER_SITE_OTHER): Payer: Medicare Other | Admitting: Internal Medicine

## 2022-08-28 VITALS — Ht 64.0 in | Wt 216.0 lb

## 2022-08-28 DIAGNOSIS — E1165 Type 2 diabetes mellitus with hyperglycemia: Secondary | ICD-10-CM | POA: Diagnosis not present

## 2022-08-28 DIAGNOSIS — R059 Cough, unspecified: Secondary | ICD-10-CM | POA: Diagnosis not present

## 2022-08-28 DIAGNOSIS — I10 Essential (primary) hypertension: Secondary | ICD-10-CM | POA: Diagnosis not present

## 2022-08-28 MED ORDER — PREDNISONE 10 MG PO TABS: ORAL_TABLET | ORAL | 0 refills | Status: DC

## 2022-08-28 NOTE — Progress Notes (Unsigned)
Subjective:    Patient ID: Misty Jones, female    DOB: 1940/11/02, 82 y.o.   MRN: [id number]  Patient here for No chief complaint on file.   HPI Work in appt - coughing and wheezing.  I saw her 08/11/22 - recurring cough and congestion.  Given prednisone and zpak.  Receevaluated 08/21/22 - tessalon perles.  CXR - minor subsegmental scarring left lung base.  No acute abnormality.    Past Medical History:  Diagnosis Date   B12 deficiency    Chicken pox    Diabetes mellitus ([place])    GERD (gastroesophageal reflux disease)    History of diverticulitis of colon    History of dysplastic nevus 07/05/2018   left mid back shv - with moderate atypia   Hypercholesterolemia    Hypertension    Impacted cerumen of left ear 07/01/2022   Nasal drainage    Non-recurrent acute suppurative otitis media of right ear without spontaneous rupture of tympanic membrane 06/16/2022   Past Surgical History:  Procedure Laterality Date   ABDOMINAL HYSTERECTOMY  [place]   ruptured disc, Dr Hardin [facility]us [facility])   [place]   COLONOSCOPY WITH PROPOFOL N/A 06/29/2016   Procedure: COLONOSCOPY WITH PROPOFOL;  Surgeon: Manya Silvas, MD;  Location: [place];  Service: Endoscopy;  Laterality: N/A;   ESOPHAGOGASTRODUODENOSCOPY (EGD) WITH PROPOFOL N/A 12/13/2017   Procedure: ESOPHAGOGASTRODUODENOSCOPY (EGD) WITH PROPOFOL;  Surgeon: Manya Silvas, MD;  Location: [facility] ENDOSCOPY;  Service: Endoscopy;  Laterality: N/A;   TONSILLECTOMY     Family History  Problem Relation Age of Onset   Hypertension Mother    Cancer Mother        Mouth cancer   Heart disease Father        myocardial infarction   Raynaud syndrome Sister    Asthma Sister    Congenital heart disease Sister    Thyroid disease Sister    Thyroid cancer Daughter    Breast cancer Neg Hx    Colon cancer Neg Hx    Social History   Socioeconomic History   Marital status: Divorced     Spouse name: Not on file   Number of children: 2   Years of education: Not on file   Highest education level: Not on file  Occupational History   Occupation: retired  Tobacco Use   Smoking status: Former    Packs/day: 0.50    Years: 20.00    Total pack years: 10.00    Types: Cigarettes    Quit date: 07/26/1984    Years since quitting: 38.1   Smokeless tobacco: Never   Tobacco comments:    quit smoking years ago  Vaping Use   Vaping Use: Never used  Substance and Sexual Activity   Alcohol use: No    Alcohol/week: 0.0 standard drinks of alcohol   Drug use: No   Sexual activity: Never  Other Topics Concern   Not on file  Social History Narrative   Not on file   Social Determinants of Health   Financial Resource Strain: Low Risk  (05/07/2022)   Overall Financial Resource Strain (CARDIA)    Difficulty of Paying Living Expenses: Not hard at all  Food Insecurity: No Food Insecurity (05/07/2022)   Hunger Vital Sign    Worried About Running Out of Food in the Last Year: Never true    [place] in the Last Year: Never true  Transportation Needs: No Transportation Needs (05/07/2022)   PRAPARE - Hydrologist (Medical): No    Lack of Transportation (Non-Medical): No  Physical Activity: Not on file  Stress: No Stress Concern Present (05/07/2022)   [place]    Feeling of Stress : Not at all  Social Connections: Unknown (05/07/2022)   Social Connection and Isolation Panel [NHANES]    Frequency of Communication with Friends and Family: More than three times a week    Frequency of Social Gatherings with Friends and Family: Not on file    Attends Religious Services: Not on file    Active Member of Clubs or Organizations: Not on file    Attends Archivist Meetings: Not on file    Marital Status: Not on file     Review of Systems     Objective:     There were  no vitals taken for this visit. Wt Readings from Last 3 Encounters:  08/21/22 216 lb (98 kg)  08/11/22 214 lb (97.1 kg)  07/01/22 214 lb 3.2 oz (97.2 kg)    Physical Exam   Outpatient Encounter Medications as of 08/28/2022  Medication Sig   albuterol (VENTOLIN HFA) 108 (90 Base) MCG/ACT inhaler Inhale 2 puffs into the lungs every 6 (six) hours as needed for wheezing or shortness of breath.   aspirin 81 MG tablet Take 81 mg by mouth daily.   benazepril (LOTENSIN) 40 MG tablet Take 1 tablet by mouth once daily   benzonatate (TESSALON) 100 MG capsule Take 2 capsules (200 mg total) by mouth 2 (two) times daily as needed for cough.   cyanocobalamin (,VITAMIN B-12,) 1000 MCG/ML injection INJECT 1 ML INTRAMUSCULARLY ONCE EVERY MONTH   glipiZIDE (GLUCOTROL XL) 2.5 MG 24 hr tablet Take 1 tablet (2.5 mg total) by mouth daily with breakfast.   glucose blood (CONTOUR TEST) test strip USE 1 STRIP TO CHECK GLUCOSE ONCE DAILY DX  E11 9 DIABETES MELLITUS   hydrochlorothiazide (HYDRODIURIL) 25 MG tablet Take 1/2 (one-half) tablet by mouth once daily   Lancets MISC Check sugars once a day Dx: E11.9 (Contour Next)   lovastatin (MEVACOR) 10 MG tablet Take one tablet on Monday and Thursday. (Patient not taking: Reported on 08/21/2022)   Multiple Vitamins-Minerals (PRESERVISION AREDS 2 PO) Take by mouth.   niacin 500 MG tablet Take 500 mg by mouth at bedtime.   nystatin cream (MYCOSTATIN) Apply 1 application. topically 2 (two) times daily.   omeprazole (PRILOSEC) 20 MG capsule Take 20 mg by mouth daily.   predniSONE (DELTASONE) 10 MG tablet Take 4 tablets x 1 day and then decrease by 1/2 tablet per day until down to zero mg. (Patient not taking: Reported on 08/21/2022)   Syringe/Needle, Disp, (SYRINGE 3CC/25GX1") 25G X 1" 3 ML MISC Use as directed to administer b12 injections.   tacrolimus (PROTOPIC) 0.1 % ointment  (Patient not taking: Reported on 08/21/2022)   No facility-administered encounter medications on  file as of 08/28/2022.     Lab Results  Component Value Date   WBC 7.3 12/31/2021   HGB 12.3 12/31/2021   HCT 37.8 12/31/2021   PLT 318.0 12/31/2021   GLUCOSE 154 (H) 08/10/2022   CHOL 259 (H) 08/10/2022   TRIG 188.0 (H) 08/10/2022   HDL 47.50 08/10/2022   LDLDIRECT 150.0 12/31/2021   LDLCALC 174 (H) 08/10/2022   ALT 35 08/10/2022   AST 22 08/10/2022   NA 140 08/10/2022  K 4.7 08/10/2022   CL 99 08/10/2022   CREATININE 0.95 08/10/2022   BUN 21 08/10/2022   CO2 33 (H) 08/10/2022   TSH 3.34 05/04/2022   INR 0.9 04/03/2012   HGBA1C 7.5 (H) 08/10/2022   MICROALBUR <0.7 04/01/2022    MM 3D SCREEN BREAST BILATERAL  Result Date: 05/22/2022 CLINICAL DATA:  Screening. EXAM: DIGITAL SCREENING BILATERAL MAMMOGRAM WITH TOMOSYNTHESIS AND CAD TECHNIQUE: Bilateral screening digital craniocaudal and mediolateral oblique mammograms were obtained. Bilateral screening digital breast tomosynthesis was performed. The images were evaluated with computer-aided detection. COMPARISON:  Previous exam(s). ACR Breast Density Category b: There are scattered areas of fibroglandular density. FINDINGS: There are no findings suspicious for malignancy. IMPRESSION: No mammographic evidence of malignancy. A result letter of this screening mammogram will be mailed directly to the patient. RECOMMENDATION: Screening mammogram in one year. (Code:SM-B-01Y) BI-RADS CATEGORY  1: Negative. Electronically Signed   By: Ammie Ferrier M.D.   On: 05/22/2022 12:24       Assessment & Plan:  There are no diagnoses linked to this encounter.   [name], MD

## 2022-08-30 ENCOUNTER — Encounter: Payer: Self-pay | Admitting: Internal Medicine

## 2022-08-30 NOTE — Assessment & Plan Note (Signed)
Persistent cough and congestion as outlined.  CXR 08/21/22 - Minor subsegmental scarring at the left lung base. Has been treated with abx and prednisone.  With increased cough - forced expiration and continued coughing "fits", will do another round of prednisone taper as directed.  Discussed importance of follow sugars on prednisone.  Also treat acid reflux - prilosec - increase to bid - before meals.  Start zyrtec as directed.  Nasacort nasal spray.  Follow.  Given persistent issues, will also ask pulmonary to evaluated.  Pt in agreement.

## 2022-08-30 NOTE — Assessment & Plan Note (Signed)
Continue benazepril and hctz.  Follow pressures.  Follow metabolic panel.

## 2022-08-30 NOTE — Assessment & Plan Note (Signed)
Since being off metformin, diarrhea has resolved.  Held on trying GLP 1 agonist, given concern regarding previous GI issues.  Did not tolerate jardiance.  a1c elevated.  On glipizide XL 2.'5mg'$  q day.  Follow sugars.  Low carb diet and exercise.  Discussed the need to follow sugars while on prednisone.  Follow.

## 2022-08-30 NOTE — Progress Notes (Signed)
Patient ID: Misty Jones, female   DOB: 1940/11/28, 82 y.o.   MRN: [id number]   Virtual Visit via video Note   All issues noted in this document were discussed and addressed.  No physical exam was performed (except for noted visual exam findings with Video Visits).   I connected with Misty Jones by a video enabled telemedicine application or telephone and verified that I am speaking with the correct person using two identifiers. Location patient: home Location provider: work or home office Persons participating in the virtual/telephone visit: patient, provider  The limitations, risks, security and privacy concerns of performing an evaluation and management service by telephone  and video and the availability of in person appointments have been discussed.  It has also been discussed with the patient that there may be a patient responsible charge related to this service. The patient expressed understanding and agreed to proceed.  Interactive audio and video telecommunications were attempted between this provider and patient, however failed, due to patient having technical difficulties.  We continued and completed visit with audio only for me and visual and audio for the patient.    Reason for visit: work in appt  HPI: Work in appt - coughing and wheezing.  To review, she was seen 05/13/22 - diagnosed with viral UrI.  06/07/22 - ear infection and treated with omnicef.  11/16 - persistent symptoms - treated with abx and prednisone.  I saw her 08/11/22 - recurring cough and congestion.  Given prednisone and zpak.  Receevaluated 08/21/22 - tessalon perles.  CXR - minor subsegmental scarring left lung base.  No acute abnormality. She continues to have coughing spells - can occur day and night.  Has noticed possibly worse at night.  Cough is occasionally productive - phlegm.  Some question of increased acid - just recent.  Using albuterol q 4 hours prn.  Sugars has been elevated.  Most readings before bed  124-160  she has had increased - 203 and 191.  Some nasal congestion/drainage.  No chest pain.  Some wheezing.  No increased sob.  Eating and drinking.    ROS: See pertinent positives and negatives per HPI.  Past Medical History:  Diagnosis Date   B12 deficiency    Chicken pox    Diabetes mellitus ([place])    GERD (gastroesophageal reflux disease)    History of diverticulitis of colon    History of dysplastic nevus 07/05/2018   left mid back shv - with moderate atypia   Hypercholesterolemia    Hypertension    Impacted cerumen of left ear 07/01/2022   Nasal drainage    Non-recurrent acute suppurative otitis media of right ear without spontaneous rupture of tympanic membrane 06/16/2022    Past Surgical History:  Procedure Laterality Date   ABDOMINAL HYSTERECTOMY  [place]   ruptured disc, Dr Hardin Negus [facility])   [place]   COLONOSCOPY WITH PROPOFOL N/A 06/29/2016   Procedure: COLONOSCOPY WITH PROPOFOL;  Surgeon: Manya Silvas, MD;  Location: [place];  Service: Endoscopy;  Laterality: N/A;   ESOPHAGOGASTRODUODENOSCOPY (EGD) WITH PROPOFOL N/A 12/13/2017   Procedure: ESOPHAGOGASTRODUODENOSCOPY (EGD) WITH PROPOFOL;  Surgeon: Manya Silvas, MD;  Location: [facility] ENDOSCOPY;  Service: Endoscopy;  Laterality: N/A;   TONSILLECTOMY      Family History  Problem Relation Age of Onset   Hypertension Mother    Cancer Mother        Mouth cancer   Heart  disease Father        myocardial infarction   Raynaud syndrome Sister    Asthma Sister    Congenital heart disease Sister    Thyroid disease Sister    Thyroid cancer Daughter    Breast cancer Neg Hx    Colon cancer Neg Hx     SOCIAL HX: reviewed.    Current Outpatient Medications:    albuterol (VENTOLIN HFA) 108 (90 Base) MCG/ACT inhaler, Inhale 2 puffs into the lungs every 6 (six) hours as needed for wheezing or shortness of breath., Disp: 18 g, Rfl: 1   aspirin 81  MG tablet, Take 81 mg by mouth daily., Disp: , Rfl:    benazepril (LOTENSIN) 40 MG tablet, Take 1 tablet by mouth once daily, Disp: 90 tablet, Rfl: 0   cyanocobalamin (,VITAMIN B-12,) 1000 MCG/ML injection, INJECT 1 ML INTRAMUSCULARLY ONCE EVERY MONTH, Disp: 30 mL, Rfl: 0   glipiZIDE (GLUCOTROL XL) 2.5 MG 24 hr tablet, Take 1 tablet (2.5 mg total) by mouth daily with breakfast., Disp: 90 tablet, Rfl: 1   glucose blood (CONTOUR TEST) test strip, USE 1 STRIP TO CHECK GLUCOSE ONCE DAILY DX  E11 9 DIABETES MELLITUS, Disp: 50 each, Rfl: 2   hydrochlorothiazide (HYDRODIURIL) 25 MG tablet, Take 1/2 (one-half) tablet by mouth once daily, Disp: 90 tablet, Rfl: 1   Lancets MISC, Check sugars once a day Dx: E11.9 (Contour Next), Disp: 100 each, Rfl: 3   lovastatin (MEVACOR) 10 MG tablet, Take one tablet on Monday and Thursday. (Patient not taking: Reported on 08/21/2022), Disp: 30 tablet, Rfl: 2   Multiple Vitamins-Minerals (PRESERVISION AREDS 2 PO), Take by mouth., Disp: , Rfl:    niacin 500 MG tablet, Take 500 mg by mouth at bedtime., Disp: , Rfl:    nystatin cream (MYCOSTATIN), Apply 1 application. topically 2 (two) times daily., Disp: 30 g, Rfl: 0   omeprazole (PRILOSEC) 20 MG capsule, Take 20 mg by mouth daily., Disp: , Rfl:    predniSONE (DELTASONE) 10 MG tablet, Take 4 tablets x 1 day and then decrease by 1/2 tablet per day until down to zero mg., Disp: 18 tablet, Rfl: 0   Syringe/Needle, Disp, (SYRINGE 3CC/25GX1") 25G X 1" 3 ML MISC, Use as directed to administer b12 injections., Disp: 50 each, Rfl: 1  EXAM:  GENERAL: alert, oriented, sounds to be in no acute distress.  Answering questions appropriately.   LUNGS: increased cough with forced expiration.    PSYCH/NEURO: pleasant and cooperative, no obvious depression or anxiety, speech and thought processing grossly intact  ASSESSMENT AND PLAN:  Discussed the following assessment and plan:  Problem List Items Addressed This Visit     Cough -  Primary    Persistent cough and congestion as outlined.  CXR 08/21/22 - Minor subsegmental scarring at the left lung base. Has been treated with abx and prednisone.  With increased cough - forced expiration and continued coughing "fits", will do another round of prednisone taper as directed.  Discussed importance of follow sugars on prednisone.  Also treat acid reflux - prilosec - increase to bid - before meals.  Start zyrtec as directed.  Nasacort nasal spray.  Follow.  Given persistent issues, will also ask pulmonary to evaluated.  Pt in agreement.       Relevant Orders   Ambulatory referral to Pulmonology   Diabetes mellitus ([place])    Since being off metformin, diarrhea has resolved.  Held on trying GLP 1 agonist, given concern regarding previous GI  issues.  Did not tolerate jardiance.  a1c elevated.  On glipizide XL 2.'5mg'$  q day.  Follow sugars.  Low carb diet and exercise.  Discussed the need to follow sugars while on prednisone.  Follow.       Hypertension    Continue benazepril and hctz.  Follow pressures.  Follow metabolic panel.        Return in about 1 year (around 08/29/2023), or if symptoms worsen or fail to improve, for keep scheduled.   I discussed the assessment and treatment plan with the patient. The patient was provided an opportunity to ask questions and all were answered. The patient agreed with the plan and demonstrated an understanding of the instructions.   The patient was advised to call back or seek an in-person evaluation if the symptoms worsen or if the condition fails to improve as anticipated.    [name], MD

## 2022-09-07 ENCOUNTER — Encounter: Payer: Self-pay | Admitting: Internal Medicine

## 2022-09-07 NOTE — Telephone Encounter (Signed)
She is doing ok and feeling ok. Patient says at last appt we started the glipizide 2.5 mg q day and mentioned possibly having to increase dose. She has taken two a day occasionally and says sugars are better. Wondering if you are ok with her continuing to do 2 per day or if you want her to stay on one 2.5 mg per day ?

## 2022-09-07 NOTE — Telephone Encounter (Signed)
Pt aware of below.

## 2022-09-07 NOTE — Telephone Encounter (Signed)
I am going to have her stay with one per day for now.  I would like to see what her sugars are going to do off prednisone and taking one per day.  Keep [place] posted and let us know if any problems.

## 2022-09-07 NOTE — Telephone Encounter (Signed)
Reviewed sugars.  I had placed her on prednisone.  Informed her would elevate sugars.  Overall ok - given on prednisone. Confirm feeling ok.  Continue to follow.

## 2022-09-22 ENCOUNTER — Encounter: Payer: Self-pay | Admitting: Student in an Organized Health Care Education/Training Program

## 2022-09-22 ENCOUNTER — Ambulatory Visit (INDEPENDENT_AMBULATORY_CARE_PROVIDER_SITE_OTHER): Payer: Medicare Other | Admitting: Student in an Organized Health Care Education/Training Program

## 2022-09-22 VITALS — BP 142/82 | HR 100 | Temp 98.0°F | Ht 64.0 in | Wt 218.6 lb

## 2022-09-22 DIAGNOSIS — R053 Chronic cough: Secondary | ICD-10-CM

## 2022-09-22 MED ORDER — FLUTICASONE PROPIONATE 50 MCG/ACT NA SUSP: 1.0000 | Freq: Every day | NASAL | 11 refills | Status: AC

## 2022-09-22 MED ORDER — BUDESONIDE-FORMOTEROL FUMARATE 80-4.5 MCG/ACT IN AERO: 2.0000 | INHALATION_SPRAY | Freq: Two times a day (BID) | RESPIRATORY_TRACT | 12 refills | Status: DC

## 2022-09-22 NOTE — Progress Notes (Signed)
Synopsis: Referred in for cough by Misty Pheasant, MD  Assessment & Plan:   1. Chronic cough  The patient is presenting for the evaluation of cough of a few months in duration with associated faint expiratory wheezing and brief improvement with prednisone. The differential includes upper airway cough syndrome, reflux disease, as well cough variant asthma. She's tried a second generation anti-histamine without much improvement. I will add flonase to her regimen to help with her nasal congestion. I will obtain a double contrast esophagogram to assess for reflux disease, in addition to obtaining a pulmonary function test to assess for signs of obstructive lung disease. Finally, I will initiate LABA/ICS (symbicort) to empirically treat reactive airway disease.  - budesonide-formoterol (SYMBICORT) 80-4.5 MCG/ACT inhaler; Inhale 2 puffs into the lungs in the morning and at bedtime.  Dispense: 1 each; Refill: 12 - fluticasone (FLONASE) 50 MCG/ACT nasal spray; Place 1 spray into both nostrils daily.  Dispense: 18.2 mL; Refill: 11 - Pulmonary Function Test [facility] Only; Future - DG ESOPHAGUS W DOUBLE CM (HD); Future  Return in about 3 months (around 12/21/2022).  I spent 60 minutes caring for this patient today, including preparing to see the patient, obtaining a medical history , reviewing a separately obtained history, performing a medically appropriate examination and/or evaluation, counseling and educating the patient/family/caregiver, ordering medications, tests, or procedures, and documenting clinical information in the electronic health record  Misty Reichert, MD Misty Jones 09/22/2022 11:22 AM    End of visit medications:  Meds ordered this encounter  Medications   budesonide-formoterol (SYMBICORT) 80-4.5 MCG/ACT inhaler    Sig: Inhale 2 puffs into the lungs in the morning and at bedtime.    Dispense:  1 each    Refill:  12   fluticasone (FLONASE) 50 MCG/ACT nasal  spray    Sig: Place 1 spray into both nostrils daily.    Dispense:  18.2 mL    Refill:  11     Current Outpatient Medications:    albuterol (VENTOLIN HFA) 108 (90 Base) MCG/ACT inhaler, Inhale 2 puffs into the lungs every 6 (six) hours as needed for wheezing or shortness of breath., Disp: 18 g, Rfl: 1   aspirin 81 MG tablet, Take 81 mg by mouth daily., Disp: , Rfl:    benazepril (LOTENSIN) 40 MG tablet, Take 1 tablet by mouth once daily, Disp: 90 tablet, Rfl: 0   budesonide-formoterol (SYMBICORT) 80-4.5 MCG/ACT inhaler, Inhale 2 puffs into the lungs in the morning and at bedtime., Disp: 1 each, Rfl: 12   cetirizine (ZYRTEC) 10 MG tablet, Take 10 mg by mouth daily., Disp: , Rfl:    cyanocobalamin (,VITAMIN B-12,) 1000 MCG/ML injection, INJECT 1 ML INTRAMUSCULARLY ONCE EVERY MONTH, Disp: 30 mL, Rfl: 0   fluticasone (FLONASE) 50 MCG/ACT nasal spray, Place 1 spray into both nostrils daily., Disp: 18.2 mL, Rfl: 11   glipiZIDE (GLUCOTROL XL) 2.5 MG 24 hr tablet, Take 1 tablet (2.5 mg total) by mouth daily with breakfast., Disp: 90 tablet, Rfl: 1   glucose blood (CONTOUR TEST) test strip, USE 1 STRIP TO CHECK GLUCOSE ONCE DAILY DX  E11 9 DIABETES MELLITUS, Disp: 50 each, Rfl: 2   hydrochlorothiazide (HYDRODIURIL) 25 MG tablet, Take 1/2 (one-half) tablet by mouth once daily, Disp: 90 tablet, Rfl: 1   Lancets MISC, Check sugars once a day Dx: E11.9 (Contour Next), Disp: 100 each, Rfl: 3   lovastatin (MEVACOR) 10 MG tablet, Take one tablet on Monday and Thursday., Disp: 30 tablet,  Rfl: 2   niacin 500 MG tablet, Take 500 mg by mouth at bedtime., Disp: , Rfl:    nystatin cream (MYCOSTATIN), Apply 1 application. topically 2 (two) times daily., Disp: 30 g, Rfl: 0   omeprazole (PRILOSEC) 20 MG capsule, Take 20 mg by mouth daily., Disp: , Rfl:    Syringe/Needle, Disp, (SYRINGE 3CC/25GX1") 25G X 1" 3 ML MISC, Use as directed to administer b12 injections., Disp: 50 each, Rfl: 1   Subjective:   PATIENT ID:  Misty Jones GENDER: female DOB: August 18, 1940, MRN: [id number]  Chief Complaint  Patient presents with   pulmonary consult    Prod cough with clear sputum, wheezing, chest tightness and SOB with exertion.     HPI  Misty Jones is a pleasant 82 year old female presenting to clinic for the evaluation of cough.  She reports a cough that started around November of 2023, and has been incessant since. The cough is sporadic, happens in fits, and can happen any time during the day. The cough is not worse at night. The cough is dry and non-productive. She has no hemoptysis, no fevers, no chills, no night sweats, and no weight loss. She does not report any associated chest pain or chest tightness. She is hard of hearing but was told by others that her cough is associated with a wheeze that can sometimes be audible.  Patient was seen by her primary Jones physician on multiple occasions for said chief complaint and has received prednisone for it twice. She reports short lived relief before the symptoms come back. She's also had an albuterol that has not provided much relief. She was started on Cetirizine with minimal improvement.  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 No occupational exposures reported. She smoked briefly but quit many years ago.  Ancillary information including prior medications, full medical/surgical/family/social histories, and PFTs (when available) are listed below and have been reviewed.   Review of Systems  Constitutional:  Negative for chills, fever, malaise/fatigue and weight loss.  Respiratory:  Positive for cough and shortness of breath. Negative for hemoptysis, sputum production and wheezing.   Cardiovascular:  Negative for chest pain.     Objective:   Vitals:   09/22/22 1104  BP: (!) 142/82  Pulse: 100  Temp: 98 F (36.7 C)  TempSrc: Temporal  SpO2: 95%  Weight: 218 lb 9.6 oz (99.2 kg)  Height: '5\' 4"'$  (1.626 m)   95% on RA  BMI Readings from Last 3  Encounters:  09/22/22 37.52 kg/m  08/28/22 37.08 kg/m  08/21/22 37.08 kg/m   Wt Readings from Last 3 Encounters:  09/22/22 218 lb 9.6 oz (99.2 kg)  08/28/22 216 lb (98 kg)  08/21/22 216 lb (98 kg)    Physical Exam Constitutional:      General: She is not in acute distress.    Appearance: Normal appearance. She is obese. She is not ill-appearing.  HENT:     Head: Normocephalic.     Nose: Congestion present.     Mouth/Throat:     Mouth: Mucous membranes are moist.  Cardiovascular:     Rate and Rhythm: Normal rate and regular rhythm.     Pulses: Normal pulses.     Heart sounds: Normal heart sounds.  Pulmonary:     Effort: Pulmonary effort is normal.     Breath sounds: Wheezing (faint end expiratory wheezes on forced expiration of ERV) present. No rhonchi or rales.  Abdominal:     General:  There is distension.     Palpations: Abdomen is soft.  Neurological:     General: No focal deficit present.     Mental Status: She is alert and oriented to person, place, and time. Mental status is at baseline.     Ancillary Information    Past Medical History:  Diagnosis Date   B12 deficiency    Chicken pox    Diabetes mellitus (HCC)    GERD (gastroesophageal reflux disease)    History of diverticulitis of colon    History of dysplastic nevus 07/05/2018   left mid back shv - with moderate atypia   Hypercholesterolemia    Hypertension    Impacted cerumen of left ear 07/01/2022   Nasal drainage    Non-recurrent acute suppurative otitis media of right ear without spontaneous rupture of tympanic membrane 06/16/2022     Family History  Problem Relation Age of Onset   Hypertension Mother    Cancer Mother        Mouth cancer   Heart disease Father        myocardial infarction   Raynaud syndrome Sister    Asthma Sister    Congenital heart disease Sister    Thyroid disease Sister    Thyroid cancer Daughter    Breast cancer Neg Hx    Colon cancer Neg Hx      Past  Surgical History:  Procedure Laterality Date   ABDOMINAL HYSTERECTOMY  1984   BACK SURGERY  1986   ruptured disc, Dr Hardin Negus [facility])   [facility]   COLONOSCOPY WITH PROPOFOL N/A 06/29/2016   Procedure: COLONOSCOPY WITH PROPOFOL;  Surgeon: Manya Silvas, MD;  Location: [facility] ENDOSCOPY;  Service: Endoscopy;  Laterality: N/A;   ESOPHAGOGASTRODUODENOSCOPY (EGD) WITH PROPOFOL N/A 12/13/2017   Procedure: ESOPHAGOGASTRODUODENOSCOPY (EGD) WITH PROPOFOL;  Surgeon: Manya Silvas, MD;  Location: [facility] ENDOSCOPY;  Service: Endoscopy;  Laterality: N/A;   TONSILLECTOMY      Social History   Socioeconomic History   Marital status: Divorced    Spouse name: Not on file   Number of children: 2   Years of education: Not on file   Highest education level: Not on file  Occupational History   Occupation: retired  Tobacco Use   Smoking status: Former    Packs/day: 0.50    Years: 20.00    Total pack years: 10.00    Types: Cigarettes    Quit date: 07/26/1984    Years since quitting: 38.1   Smokeless tobacco: Never   Tobacco comments:    quit smoking years ago  Vaping Use   Vaping Use: Never used  Substance and Sexual Activity   Alcohol use: No    Alcohol/week: 0.0 standard drinks of alcohol   Drug use: No   Sexual activity: Never  Other Topics Concern   Not on file  Social History Narrative   Not on file   Social Determinants of Health   Financial Resource Strain: Low Risk  (05/07/2022)   Overall Financial Resource Strain (CARDIA)    Difficulty of Paying Living Expenses: Not hard at all  Food Insecurity: No Food Insecurity (05/07/2022)   Hunger Vital Sign    Worried About Running Out of Food in the Last Year: Never true    [place] in the Last Year: Never true  Transportation Needs: No Transportation Needs (05/07/2022)   PRAPARE - Hydrologist (Medical): No  Lack of Transportation (Non-Medical): No   Physical Activity: Not on file  Stress: No Stress Concern Present (05/07/2022)   Max    Feeling of Stress : Not at all  Social Connections: Unknown (05/07/2022)   Social Connection and Isolation Panel [NHANES]    Frequency of Communication with Friends and Family: More than three times a week    Frequency of Social Gatherings with Friends and Family: Not on file    Attends Religious Services: Not on file    Active Member of Clubs or Organizations: Not on file    Attends Club or Organization Meetings: Not on file    Marital Status: Not on file  Intimate Partner Violence: Not At Risk (05/07/2022)   Humiliation, Afraid, Rape, and Kick questionnaire    Fear of Current or Ex-Partner: No    Emotionally Abused: No    Physically Abused: No    Sexually Abused: No     Allergies  Allergen Reactions   Augmentin [Amoxicillin-Pot Clavulanate] Other (See Comments)    vomiting   Protonix [Pantoprazole Sodium]      CBC    Component Value Date/Time   WBC 7.3 12/31/2021 0841   RBC 4.17 12/31/2021 0841   HGB 12.3 12/31/2021 0841   HGB 12.8 04/03/2012 0133   HCT 37.8 12/31/2021 0841   HCT 39.4 04/03/2012 0133   PLT 318.0 12/31/2021 0841   PLT 340 04/03/2012 0133   MCV 90.7 12/31/2021 0841   MCV 90 04/03/2012 0133   MCH 30.3 09/13/2016 0755   MCHC 32.4 12/31/2021 0841   RDW 14.3 12/31/2021 0841   RDW 14.0 04/03/2012 0133   LYMPHSABS 2.4 12/31/2021 0841   MONOABS 0.4 12/31/2021 0841   EOSABS 0.1 12/31/2021 0841   BASOSABS 0.1 12/31/2021 0841    Pulmonary Functions Testing Results:     No data to display          Outpatient Medications Prior to Visit  Medication Sig Dispense Refill   albuterol (VENTOLIN HFA) 108 (90 Base) MCG/ACT inhaler Inhale 2 puffs into the lungs every 6 (six) hours as needed for wheezing or shortness of breath. 18 g 1   aspirin 81 MG tablet Take 81 mg by mouth daily.     benazepril  (LOTENSIN) 40 MG tablet Take 1 tablet by mouth once daily 90 tablet 0   cetirizine (ZYRTEC) 10 MG tablet Take 10 mg by mouth daily.     cyanocobalamin (,VITAMIN B-12,) 1000 MCG/ML injection INJECT 1 ML INTRAMUSCULARLY ONCE EVERY MONTH 30 mL 0   glipiZIDE (GLUCOTROL XL) 2.5 MG 24 hr tablet Take 1 tablet (2.5 mg total) by mouth daily with breakfast. 90 tablet 1   glucose blood (CONTOUR TEST) test strip USE 1 STRIP TO CHECK GLUCOSE ONCE DAILY DX  E11 9 DIABETES MELLITUS 50 each 2   hydrochlorothiazide (HYDRODIURIL) 25 MG tablet Take 1/2 (one-half) tablet by mouth once daily 90 tablet 1   Lancets MISC Check sugars once a day Dx: E11.9 (Contour Next) 100 each 3   lovastatin (MEVACOR) 10 MG tablet Take one tablet on Monday and Thursday. 30 tablet 2   niacin 500 MG tablet Take 500 mg by mouth at bedtime.     nystatin cream (MYCOSTATIN) Apply 1 application. topically 2 (two) times daily. 30 g 0   omeprazole (PRILOSEC) 20 MG capsule Take 20 mg by mouth daily.     Syringe/Needle, Disp, (SYRINGE 3CC/25GX1") 25G X 1" 3 ML MISC Use  as directed to administer b12 injections. 50 each 1   Multiple Vitamins-Minerals (PRESERVISION AREDS 2 PO) Take by mouth.     predniSONE (DELTASONE) 10 MG tablet Take 4 tablets x 1 day and then decrease by 1/2 tablet per day until down to zero mg. 18 tablet 0   No facility-administered medications prior to visit.

## 2022-09-23 ENCOUNTER — Ambulatory Visit (INDEPENDENT_AMBULATORY_CARE_PROVIDER_SITE_OTHER): Payer: Medicare Other | Admitting: Internal Medicine

## 2022-09-23 ENCOUNTER — Encounter: Payer: Self-pay | Admitting: Internal Medicine

## 2022-09-23 ENCOUNTER — Other Ambulatory Visit: Payer: Self-pay | Admitting: Internal Medicine

## 2022-09-23 VITALS — BP 130/76 | HR 86 | Temp 97.9°F | Resp 16 | Ht 64.0 in | Wt 218.0 lb

## 2022-09-23 DIAGNOSIS — I10 Essential (primary) hypertension: Secondary | ICD-10-CM

## 2022-09-23 DIAGNOSIS — R059 Cough, unspecified: Secondary | ICD-10-CM

## 2022-09-23 DIAGNOSIS — I700 Atherosclerosis of aorta: Secondary | ICD-10-CM | POA: Diagnosis not present

## 2022-09-23 DIAGNOSIS — G720 Drug-induced myopathy: Secondary | ICD-10-CM

## 2022-09-23 DIAGNOSIS — E7800 Pure hypercholesterolemia, unspecified: Secondary | ICD-10-CM | POA: Diagnosis not present

## 2022-09-23 DIAGNOSIS — K219 Gastro-esophageal reflux disease without esophagitis: Secondary | ICD-10-CM | POA: Diagnosis not present

## 2022-09-23 DIAGNOSIS — T466X5A Adverse effect of antihyperlipidemic and antiarteriosclerotic drugs, initial encounter: Secondary | ICD-10-CM

## 2022-09-23 DIAGNOSIS — E1165 Type 2 diabetes mellitus with hyperglycemia: Secondary | ICD-10-CM

## 2022-09-23 MED ORDER — TELMISARTAN 80 MG PO TABS: 80.0000 mg | ORAL_TABLET | Freq: Every day | ORAL | 1 refills | Status: DC

## 2022-09-23 NOTE — Progress Notes (Signed)
Subjective:    Patient ID: Misty Jones, female    DOB: 11/21/1940, 82 y.o.   MRN: [id number]  Patient here for  Chief Complaint  Patient presents with   Medical Management of Chronic Issues    HPI Work in appt - to discuss changing blood pressure medication.  Was seen 09/22/22 - by pulmonary  - for chronic cough. The differential includes upper airway cough syndrome, reflux disease, as well cough variant asthma.  Recommended - double contrast esophagogram to assess for reflux.  Also recommended PFTs.  Added symbicort.  The question was raised if her ace inhibitor could be contributing.  She is here to discuss.  Discussed changing medication.  Cough is worse at night.  Prior to getting sick, she would notice some minimal dry cough at night only.  Bowels doing ok.  Started symbicort.  Felt better last night.    Past Medical History:  Diagnosis Date   B12 deficiency    Chicken pox    Diabetes mellitus ([place])    GERD (gastroesophageal reflux disease)    History of diverticulitis of colon    History of dysplastic nevus 07/05/2018   left mid back shv - with moderate atypia   Hypercholesterolemia    Hypertension    Impacted cerumen of left ear 07/01/2022   Nasal drainage    Non-recurrent acute suppurative otitis media of right ear without spontaneous rupture of tympanic membrane 06/16/2022   Past Surgical History:  Procedure Laterality Date   ABDOMINAL HYSTERECTOMY  [place]   ruptured disc, Dr Hardin Negus [facility])   [place]   COLONOSCOPY WITH PROPOFOL N/A 06/29/2016   Procedure: COLONOSCOPY WITH PROPOFOL;  Surgeon: Manya Silvas, MD;  Location: [facility];  Service: Endoscopy;  Laterality: N/A;   ESOPHAGOGASTRODUODENOSCOPY (EGD) WITH PROPOFOL N/A 12/13/2017   Procedure: ESOPHAGOGASTRODUODENOSCOPY (EGD) WITH PROPOFOL;  Surgeon: Manya Silvas, MD;  Location[facility] ENDOSCOPY;  Service: Endoscopy;  Laterality: N/A;    TONSILLECTOMY     Family History  Problem Relation Age of Onset   Hypertension Mother    Cancer Mother        Mouth cancer   Heart disease Father        myocardial infarction   Raynaud syndrome Sister    Asthma Sister    Congenital heart disease Sister    Thyroid disease Sister    Thyroid cancer Daughter    Breast cancer Neg Hx    Colon cancer Neg Hx    Social History   Socioeconomic History   Marital status: Divorced    Spouse name: Not on file   Number of children: 2   Years of education: Not on file   Highest education level: Not on file  Occupational History   Occupation: retired  Tobacco Use   Smoking status: Former    Packs/day: 0.50    Years: 20.00    Total pack years: 10.00    Types: Cigarettes    Quit date: 07/26/1984    Years since quitting: 38.1   Smokeless tobacco: Never   Tobacco comments:    quit smoking years ago  Vaping Use   Vaping Use: Never used  Substance and Sexual Activity   Alcohol use: No    Alcohol/week: 0.0 standard drinks of alcohol   Drug use: No   Sexual activity: Never  Other Topics Concern   Not on file  Social History Narrative  Not on file   Social Determinants of Health   Financial Resource Strain: Low Risk  (05/07/2022)   Overall Financial Resource Strain (CARDIA)    Difficulty of Paying Living Expenses: Not hard at all  Food Insecurity: No Food Insecurity (05/07/2022)   Hunger Vital Sign    Worried About Running Out of Food in the Last Year: Never true    Ran Out of Food in the Last Year: Never true  Transportation Needs: No Transportation Needs (05/07/2022)   PRAPARE - Hydrologist (Medical): No    Lack of Transportation (Non-Medical): No  Physical Activity: Not on file  Stress: No Stress Concern Present (05/07/2022)   [place]    Feeling of Stress : Not at all  Social Connections: Unknown (05/07/2022)   Social  Connection and Isolation Panel [NHANES]    Frequency of Communication with Friends and Family: More than three times a week    Frequency of Social Gatherings with Friends and Family: Not on file    Attends Religious Services: Not on file    Active Member of Clubs or Organizations: Not on file    Attends Archivist Meetings: Not on file    Marital Status: Not on file     Review of Systems  Constitutional:  Negative for appetite change and unexpected weight change.  HENT:  Negative for congestion and sinus pressure.   Respiratory:  Positive for cough. Negative for chest tightness.        Breathing stable.   Cardiovascular:  Negative for chest pain and palpitations.  Gastrointestinal:  Negative for abdominal pain, diarrhea, nausea and vomiting.  Genitourinary:  Negative for difficulty urinating and dysuria.  Musculoskeletal:  Negative for joint swelling and myalgias.  Skin:  Negative for color change and rash.  Neurological:  Negative for dizziness and headaches.  Psychiatric/Behavioral:  Negative for agitation and dysphoric mood.        Objective:     BP 130/76   Pulse 86   Temp 97.9 F (36.6 C)   Resp 16   Ht '5\' 4"'$  (1.626 m)   Wt 218 lb (98.9 kg)   SpO2 98%   BMI 37.42 kg/m  Wt Readings from Last 3 Encounters:  09/23/22 218 lb (98.9 kg)  09/22/22 218 lb 9.6 oz (99.2 kg)  08/28/22 216 lb (98 kg)    Physical Exam Vitals reviewed.  Constitutional:      General: She is not in acute distress.    Appearance: Normal appearance.  HENT:     Head: Normocephalic and atraumatic.     Right Ear: External ear normal.     Left Ear: External ear normal.  Eyes:     General: No scleral icterus.       Right eye: No discharge.        Left eye: No discharge.     Conjunctiva/sclera: Conjunctivae normal.  Neck:     Thyroid: No thyromegaly.  Cardiovascular:     Rate and Rhythm: Normal rate and regular rhythm.  Pulmonary:     Effort: No respiratory distress.     Breath  sounds: Normal breath sounds. No wheezing.  Abdominal:     General: Bowel sounds are normal.     Palpations: Abdomen is soft.     Tenderness: There is no abdominal tenderness.  Musculoskeletal:        General: No swelling or tenderness.     Cervical  back: Neck supple. No tenderness.  Lymphadenopathy:     Cervical: No cervical adenopathy.  Skin:    Findings: No erythema or rash.  Neurological:     Mental Status: She is alert.  Psychiatric:        Mood and Affect: Mood normal.        Behavior: Behavior normal.      Outpatient Encounter Medications as of 09/23/2022  Medication Sig   [DISCONTINUED] telmisartan (MICARDIS) 80 MG tablet Take 1 tablet (80 mg total) by mouth daily.   albuterol (VENTOLIN HFA) 108 (90 Base) MCG/ACT inhaler Inhale 2 puffs into the lungs every 6 (six) hours as needed for wheezing or shortness of breath.   aspirin 81 MG tablet Take 81 mg by mouth daily.   budesonide-formoterol (SYMBICORT) 80-4.5 MCG/ACT inhaler Inhale 2 puffs into the lungs in the morning and at bedtime.   cetirizine (ZYRTEC) 10 MG tablet Take 10 mg by mouth daily.   cyanocobalamin (,VITAMIN B-12,) 1000 MCG/ML injection INJECT 1 ML INTRAMUSCULARLY ONCE EVERY MONTH   fluticasone (FLONASE) 50 MCG/ACT nasal spray Place 1 spray into both nostrils daily.   glipiZIDE (GLUCOTROL XL) 2.5 MG 24 hr tablet Take 1 tablet (2.5 mg total) by mouth daily with breakfast.   glucose blood (CONTOUR TEST) test strip USE 1 STRIP TO CHECK GLUCOSE ONCE DAILY DX  E11 9 DIABETES MELLITUS   hydrochlorothiazide (HYDRODIURIL) 25 MG tablet Take 1/2 (one-half) tablet by mouth once daily   Lancets MISC Check sugars once a day Dx: E11.9 (Contour Next)   lovastatin (MEVACOR) 10 MG tablet Take one tablet on Monday and Thursday.   niacin 500 MG tablet Take 500 mg by mouth at bedtime.   nystatin cream (MYCOSTATIN) Apply 1 application. topically 2 (two) times daily.   omeprazole (PRILOSEC) 20 MG capsule Take 20 mg by mouth daily.    Syringe/Needle, Disp, (SYRINGE 3CC/25GX1") 25G X 1" 3 ML MISC Use as directed to administer b12 injections.   [DISCONTINUED] benazepril (LOTENSIN) 40 MG tablet Take 1 tablet by mouth once daily   No facility-administered encounter medications on file as of 09/23/2022.     Lab Results  Component Value Date   WBC 7.3 12/31/2021   HGB 12.3 12/31/2021   HCT 37.8 12/31/2021   PLT 318.0 12/31/2021   GLUCOSE 154 (H) 08/10/2022   CHOL 259 (H) 08/10/2022   TRIG 188.0 (H) 08/10/2022   HDL 47.50 08/10/2022   LDLDIRECT 150.0 12/31/2021   LDLCALC 174 (H) 08/10/2022   ALT 35 08/10/2022   AST 22 08/10/2022   NA 140 08/10/2022   K 4.7 08/10/2022   CL 99 08/10/2022   CREATININE 0.95 08/10/2022   BUN 21 08/10/2022   CO2 33 (H) 08/10/2022   TSH 3.34 05/04/2022   INR 0.9 04/03/2012   HGBA1C 7.5 (H) 08/10/2022   MICROALBUR <0.7 04/01/2022    MM 3D SCREEN BREAST BILATERAL  Result Date: 05/22/2022 CLINICAL DATA:  Screening. EXAM: DIGITAL SCREENING BILATERAL MAMMOGRAM WITH TOMOSYNTHESIS AND CAD TECHNIQUE: Bilateral screening digital craniocaudal and mediolateral oblique mammograms were obtained. Bilateral screening digital breast tomosynthesis was performed. The images were evaluated with computer-aided detection. COMPARISON:  Previous exam(s). ACR Breast Density Category b: There are scattered areas of fibroglandular density. FINDINGS: There are no findings suspicious for malignancy. IMPRESSION: No mammographic evidence of malignancy. A result letter of this screening mammogram will be mailed directly to the patient. RECOMMENDATION: Screening mammogram in one year. (Code:SM-B-01Y) BI-RADS CATEGORY  1: Negative. Electronically Signed   By:  Ammie Ferrier M.D.   On: 05/22/2022 12:24       Assessment & Plan:  Aortic atherosclerosis (HCC) Assessment & Plan: Intolerant to statins and zetia.  Off repatha.  Low cholesterol diet and exercise.  Follow lipid panel and liver function tests.   Lab  Results  Component Value Date   CHOL 259 (H) 08/10/2022   HDL 47.50 08/10/2022   LDLCALC 174 (H) 08/10/2022   LDLDIRECT 150.0 12/31/2021   TRIG 188.0 (H) 08/10/2022   CHOLHDL 5 08/10/2022     Cough, unspecified type Assessment & Plan: Was seen 09/22/22 - by pulmonary  - for chronic cough. The differential includes upper airway cough syndrome, reflux disease, as well cough variant asthma.  Recommended - double contrast esophagogram to assess for reflux.  Also recommended PFTs.  Added symbicort.  The question was raised if her ace inhibitor could be contributing.  Discussed changing medication.  Cough is worse at night.  Prior to getting sick, she would notice some minimal dry cough at night only.  Will stop benazepril.  Start micardis '80mg'$  q day.  Follow pressures.     Type 2 diabetes mellitus with hyperglycemia, without long-term current use of insulin ([place]) Assessment & Plan: Since being off metformin, diarrhea has resolved.  Held on trying GLP 1 agonist, given concern regarding previous GI issues.  Did not tolerate jardiance.  a1c elevated.  On glipizide XL 2.'5mg'$  q day.  Follow sugars.  Low carb diet and exercise.      Gastroesophageal reflux disease, unspecified whether esophagitis present Assessment & Plan: Continue prilosec. Follow.    Hypercholesterolemia Assessment & Plan: Intolerant to statins, zetia and repatha.  On no medication now. Did not tolerate lovastatin.  Low cholesterol diet and exercise.  Follow lipid panel and liver function tests.    Primary hypertension Assessment & Plan: Currently on benazepril and hctz.  Discussed possible cough - from ace inhibitor.  Will stop benazepril.  Start micardis '80mg'$  q day. Follow pressures.  Follow metabolic panel.    Statin myopathy Assessment & Plan: Intolerant to statin medication as outlined.       [name], MD

## 2022-09-25 DIAGNOSIS — H353124 Nonexudative age-related macular degeneration, left eye, advanced atrophic with subfoveal involvement: Secondary | ICD-10-CM | POA: Diagnosis not present

## 2022-09-27 ENCOUNTER — Encounter: Payer: Self-pay | Admitting: Internal Medicine

## 2022-09-27 NOTE — Assessment & Plan Note (Addendum)
Was seen 09/22/22 - by pulmonary  - for chronic cough. The differential includes upper airway cough syndrome, reflux disease, as well cough variant asthma.  Recommended - double contrast esophagogram to assess for reflux.  Also recommended PFTs.  Added symbicort.  The question was raised if her ace inhibitor could be contributing.  Discussed changing medication.  Cough is worse at night.  Prior to getting sick, she would notice some minimal dry cough at night only.  Will stop benazepril.  Start micardis '80mg'$  q day.  Follow pressures.

## 2022-09-27 NOTE — Assessment & Plan Note (Signed)
Intolerant to statins, zetia and repatha.  On no medication now. Did not tolerate lovastatin.  Low cholesterol diet and exercise.  Follow lipid panel and liver function tests.

## 2022-09-27 NOTE — Assessment & Plan Note (Signed)
Intolerant to statin medication as outlined.

## 2022-09-27 NOTE — Assessment & Plan Note (Signed)
Since being off metformin, diarrhea has resolved.  Held on trying GLP 1 agonist, given concern regarding previous GI issues.  Did not tolerate jardiance.  a1c elevated.  On glipizide XL 2.'5mg'$  q day.  Follow sugars.  Low carb diet and exercise.

## 2022-09-27 NOTE — Assessment & Plan Note (Signed)
Intolerant to statins and zetia.  Off repatha.  Low cholesterol diet and exercise.  Follow lipid panel and liver function tests.   Lab Results  Component Value Date   CHOL 259 (H) 08/10/2022   HDL 47.50 08/10/2022   LDLCALC 174 (H) 08/10/2022   LDLDIRECT 150.0 12/31/2021   TRIG 188.0 (H) 08/10/2022   CHOLHDL 5 08/10/2022

## 2022-09-27 NOTE — Assessment & Plan Note (Signed)
Currently on benazepril and hctz.  Discussed possible cough - from ace inhibitor.  Will stop benazepril.  Start micardis '80mg'$  q day. Follow pressures.  Follow metabolic panel.

## 2022-09-27 NOTE — Assessment & Plan Note (Signed)
Continue prilosec. Follow.

## 2022-09-29 ENCOUNTER — Ambulatory Visit
Admission: RE | Admit: 2022-09-29 | Discharge: 2022-09-29 | Disposition: A | Discharge status: Home / Self Care | Payer: Medicare Other | Source: Ambulatory Visit | Attending: Student in an Organized Health Care Education/Training Program | Admitting: Student in an Organized Health Care Education/Training Program

## 2022-09-29 DIAGNOSIS — R053 Chronic cough: Secondary | ICD-10-CM | POA: Diagnosis not present

## 2022-09-29 DIAGNOSIS — K224 Dyskinesia of esophagus: Secondary | ICD-10-CM | POA: Diagnosis not present

## 2022-09-29 DIAGNOSIS — K449 Diaphragmatic hernia without obstruction or gangrene: Secondary | ICD-10-CM | POA: Diagnosis not present

## 2022-09-29 DIAGNOSIS — K219 Gastro-esophageal reflux disease without esophagitis: Secondary | ICD-10-CM | POA: Diagnosis not present

## 2022-10-03 ENCOUNTER — Encounter: Payer: Self-pay | Admitting: Internal Medicine

## 2022-10-05 NOTE — Telephone Encounter (Signed)
Per pt stopped Friday - sx have resolved. Will monitor and let us know if anything comes back

## 2022-10-05 NOTE — Telephone Encounter (Signed)
Per review, she has stopped the micardis (telmisartan).  Have the symptoms resolved?  If persistent problems, will need to let us know/be evaluated.

## 2022-10-07 ENCOUNTER — Other Ambulatory Visit: Payer: Self-pay | Admitting: Internal Medicine

## 2022-10-08 ENCOUNTER — Encounter: Payer: Self-pay | Admitting: Student in an Organized Health Care Education/Training Program

## 2022-10-08 NOTE — Telephone Encounter (Signed)
She may try getting off the Symbicort for a few days and see how she does.  If she has any issues with shortness of breath or cough she may use her rescue albuterol.  If her symptoms of chest achiness do not get better off of the Symbicort she will need to discuss with her primary care physician.

## 2022-10-08 NOTE — Telephone Encounter (Signed)
Dr. Patsey Berthold, please advise. Dr. Genia Harold is unavailable.

## 2022-10-12 ENCOUNTER — Other Ambulatory Visit: Payer: Self-pay | Admitting: Internal Medicine

## 2022-10-13 ENCOUNTER — Other Ambulatory Visit: Payer: Self-pay | Admitting: Internal Medicine

## 2022-10-13 ENCOUNTER — Encounter: Payer: Self-pay | Admitting: Internal Medicine

## 2022-10-13 NOTE — Telephone Encounter (Signed)
Did she notice any change in symptoms off benazepril.  If yes, then we can switch to a different medication.  If no change and no improvement off benazepril, then ok to refill.

## 2022-10-14 ENCOUNTER — Encounter: Payer: Self-pay | Admitting: Internal Medicine

## 2022-10-14 MED ORDER — BENAZEPRIL HCL 40 MG PO TABS: 40.0000 mg | ORAL_TABLET | Freq: Every day | ORAL | 1 refills | Status: DC

## 2022-10-14 NOTE — Telephone Encounter (Signed)
Patient says that cough and wheezing is better but she has been using her symbicort. She does not feel that the cough and wheezing was related to the benazepril because she was on that for years. She started herself back on the benazepril and so far- no cough/wheezing. Still ok to refill?

## 2022-10-14 NOTE — Telephone Encounter (Signed)
Rx ok'd for benazepril and already sent in.  Pt notified.

## 2022-10-14 NOTE — Telephone Encounter (Signed)
See other note

## 2022-10-14 NOTE — Telephone Encounter (Signed)
Done

## 2022-10-14 NOTE — Telephone Encounter (Signed)
Rx ok'd for benazepril.

## 2022-10-14 NOTE — Telephone Encounter (Signed)
See phone message.  Ok to refill if not problems with benazepril ( of if saw no change in symptoms when off medication)

## 2022-10-14 NOTE — Telephone Encounter (Signed)
Misty Jones back and wanted the benazepril to go to mail order.  I sent the rx for benazepril to mail order and informed her was being sent.  Please cancel the rx to local [facility].

## 2022-10-19 DIAGNOSIS — E119 Type 2 diabetes mellitus without complications: Secondary | ICD-10-CM | POA: Diagnosis not present

## 2022-10-20 ENCOUNTER — Ambulatory Visit: Payer: Medicare Other | Attending: Student in an Organized Health Care Education/Training Program

## 2022-10-20 DIAGNOSIS — R053 Chronic cough: Secondary | ICD-10-CM | POA: Diagnosis present

## 2022-10-20 LAB — PULMONARY FUNCTION TEST ARMC ONLY
DL/VA % pred: 112 %
DL/VA: 4.56 ml/min/mmHg/L
DLCO unc % pred: 106 %
DLCO unc: 19.91 ml/min/mmHg
FEF 25-75 Post: 1.38 L/sec
FEF 25-75 Pre: 1.18 L/sec
FEF2575-%Change-Post: 17 %
FEF2575-%Pred-Post: 105 %
FEF2575-%Pred-Pre: 90 %
FEV1-%Change-Post: 9 %
FEV1-%Pred-Post: 82 %
FEV1-%Pred-Pre: 75 %
FEV1-Post: 1.55 L
FEV1-Pre: 1.42 L
FEV1FVC-%Change-Post: 2 %
FEV1FVC-%Pred-Pre: 96 %
FEV6-%Change-Post: 8 %
FEV6-%Pred-Post: 89 %
FEV6-%Pred-Pre: 82 %
FEV6-Post: 2.14 L
FEV6-Pre: 1.97 L
FEV6FVC-%Change-Post: 0 %
FEV6FVC-%Pred-Post: 106 %
FEV6FVC-%Pred-Pre: 105 %
FVC-%Change-Post: 6 %
FVC-%Pred-Post: 84 %
FVC-%Pred-Pre: 79 %
FVC-Post: 2.14 L
FVC-Pre: 2.01 L
Post FEV1/FVC ratio: 72 %
Post FEV6/FVC ratio: 100 %
Pre FEV1/FVC ratio: 71 %
Pre FEV6/FVC Ratio: 100 %
RV % pred: 84 %
RV: 2.04 L
TLC % pred: 89 %
TLC: 4.51 L

## 2022-10-20 MED ORDER — ALBUTEROL SULFATE (2.5 MG/3ML) 0.083% IN NEBU
2.5000 mg | INHALATION_SOLUTION | Freq: Once | RESPIRATORY_TRACT | Status: AC
  Administered 2022-10-20: 2.5 mg via RESPIRATORY_TRACT
  Filled 2022-10-20: qty 3

## 2022-11-09 ENCOUNTER — Other Ambulatory Visit (INDEPENDENT_AMBULATORY_CARE_PROVIDER_SITE_OTHER): Payer: Medicare Other

## 2022-11-09 DIAGNOSIS — E1165 Type 2 diabetes mellitus with hyperglycemia: Secondary | ICD-10-CM | POA: Diagnosis not present

## 2022-11-09 DIAGNOSIS — E7800 Pure hypercholesterolemia, unspecified: Secondary | ICD-10-CM | POA: Diagnosis not present

## 2022-11-09 LAB — HEPATIC FUNCTION PANEL
ALT: 22 U/L (ref 0–35)
AST: 18 U/L (ref 0–37)
Albumin: 3.9 g/dL (ref 3.5–5.2)
Alkaline Phosphatase: 72 U/L (ref 39–117)
Bilirubin, Direct: 0.1 mg/dL (ref 0.0–0.3)
Total Bilirubin: 0.4 mg/dL (ref 0.2–1.2)
Total Protein: 5.8 g/dL — ABNORMAL LOW (ref 6.0–8.3)

## 2022-11-09 LAB — LIPID PANEL
Cholesterol: 265 mg/dL — ABNORMAL HIGH (ref 0–200)
HDL: 54.1 mg/dL (ref >39.00)
LDL Cholesterol: 178 mg/dL — ABNORMAL HIGH (ref 0–99)
NonHDL: 210.7
Total CHOL/HDL Ratio: 5
Triglycerides: 163 mg/dL — ABNORMAL HIGH (ref 0.0–149.0)
VLDL: 32.6 mg/dL (ref 0.0–40.0)

## 2022-11-09 LAB — CBC WITH DIFFERENTIAL/PLATELET
Basophils Absolute: 0.1 10*3/uL (ref 0.0–0.1)
Basophils Relative: 0.9 % (ref 0.0–3.0)
Eosinophils Absolute: 0.2 10*3/uL (ref 0.0–0.7)
Eosinophils Relative: 2.7 % (ref 0.0–5.0)
HCT: 39.1 % (ref 36.0–46.0)
Hemoglobin: 12.6 g/dL (ref 12.0–15.0)
Lymphocytes Relative: 29.8 % (ref 12.0–46.0)
Lymphs Abs: 2.3 10*3/uL (ref 0.7–4.0)
MCHC: 32.3 g/dL (ref 30.0–36.0)
MCV: 92.1 fl (ref 78.0–100.0)
Monocytes Absolute: 0.6 10*3/uL (ref 0.1–1.0)
Monocytes Relative: 7.2 % (ref 3.0–12.0)
Neutro Abs: 4.7 10*3/uL (ref 1.4–7.7)
Neutrophils Relative %: 59.4 % (ref 43.0–77.0)
Platelets: 322 10*3/uL (ref 150.0–400.0)
RBC: 4.25 Mil/uL (ref 3.87–5.11)
RDW: 15.1 % (ref 11.5–15.5)
WBC: 7.9 10*3/uL (ref 4.0–10.5)

## 2022-11-09 LAB — BASIC METABOLIC PANEL
BUN: 20 mg/dL (ref 6–23)
CO2: 34 mEq/L — ABNORMAL HIGH (ref 19–32)
Calcium: 9 mg/dL (ref 8.4–10.5)
Chloride: 100 mEq/L (ref 96–112)
Creatinine, Ser: 0.89 mg/dL (ref 0.40–1.20)
GFR: 60.33 mL/min (ref >60.00)
Glucose, Bld: 156 mg/dL — ABNORMAL HIGH (ref 70–99)
Potassium: 4.6 mEq/L (ref 3.5–5.1)
Sodium: 141 mEq/L (ref 135–145)

## 2022-11-09 LAB — HEMOGLOBIN A1C: Hgb A1c MFr Bld: 7.5 % — ABNORMAL HIGH (ref 4.6–6.5)

## 2022-11-11 ENCOUNTER — Encounter: Payer: Self-pay | Admitting: Internal Medicine

## 2022-11-11 ENCOUNTER — Ambulatory Visit (INDEPENDENT_AMBULATORY_CARE_PROVIDER_SITE_OTHER): Payer: Medicare Other | Admitting: Internal Medicine

## 2022-11-11 VITALS — BP 128/70 | HR 83 | Temp 98.2°F | Resp 16 | Ht 64.0 in | Wt 218.8 lb

## 2022-11-11 DIAGNOSIS — K219 Gastro-esophageal reflux disease without esophagitis: Secondary | ICD-10-CM

## 2022-11-11 DIAGNOSIS — M25512 Pain in left shoulder: Secondary | ICD-10-CM

## 2022-11-11 DIAGNOSIS — M25561 Pain in right knee: Secondary | ICD-10-CM

## 2022-11-11 DIAGNOSIS — M25562 Pain in left knee: Secondary | ICD-10-CM

## 2022-11-11 DIAGNOSIS — I10 Essential (primary) hypertension: Secondary | ICD-10-CM

## 2022-11-11 DIAGNOSIS — E7800 Pure hypercholesterolemia, unspecified: Secondary | ICD-10-CM

## 2022-11-11 DIAGNOSIS — I700 Atherosclerosis of aorta: Secondary | ICD-10-CM | POA: Diagnosis not present

## 2022-11-11 DIAGNOSIS — G720 Drug-induced myopathy: Secondary | ICD-10-CM

## 2022-11-11 DIAGNOSIS — E1165 Type 2 diabetes mellitus with hyperglycemia: Secondary | ICD-10-CM

## 2022-11-11 DIAGNOSIS — T466X5A Adverse effect of antihyperlipidemic and antiarteriosclerotic drugs, initial encounter: Secondary | ICD-10-CM

## 2022-11-11 NOTE — Progress Notes (Signed)
Subjective:    Patient ID: Misty Jones, female    DOB: 24-Apr-1941, 82 y.o.   MRN: [id number]  Patient here for  Chief Complaint  Patient presents with   Medical Management of Chronic Issues    HPI Here to follow up regarding hypercholesterolemia, diabetes and hypertension.  Tries to stay active.  Does report knees hurt.  Limits exercise.  Discussed PT.  Agreeable.  Also reports increased left shoulder pain.  Limited rom.  Discussed further w/up and evaluation.  No chest pain or sob reported.  No abdominal pain or bowel change reported.  Discussed recent labs.  A1c 7.5.    Past Medical History:  Diagnosis Date   B12 deficiency    Chicken pox    Diabetes mellitus    GERD (gastroesophageal reflux disease)    History of diverticulitis of colon    History of dysplastic nevus 07/05/2018   left mid back shv - with moderate atypia   Hypercholesterolemia    Hypertension    Impacted cerumen of left ear 07/01/2022   Nasal drainage    Non-recurrent acute suppurative otitis media of right ear without spontaneous rupture of tympanic membrane 06/16/2022   Past Surgical History:  Procedure Laterality Date   ABDOMINAL HYSTERECTOMY  1984   BACK SURGERY  1986   ruptured disc, Dr Vear Clock [facility])   CHOLECYSTECTOMY  1991   CHOLECYSTECTOMY  1992   COLONOSCOPY WITH PROPOFOL N/A 06/29/2016   Procedure: COLONOSCOPY WITH PROPOFOL;  Surgeon: Scot Jun, MD;  Location: [facility] ENDOSCOPY;  Service: Endoscopy;  Laterality: N/A;   ESOPHAGOGASTRODUODENOSCOPY (EGD) WITH PROPOFOL N/A 12/13/2017   Procedure: ESOPHAGOGASTRODUODENOSCOPY (EGD) WITH PROPOFOL;  Surgeon: Scot Jun, MD;  Location: [facility] ENDOSCOPY;  Service: Endoscopy;  Laterality: N/A;   TONSILLECTOMY     Family History  Problem Relation Age of Onset   Hypertension Mother    Cancer Mother        Mouth cancer   Heart disease Father        myocardial infarction   Raynaud syndrome Sister    Asthma Sister    Congenital heart  disease Sister    Thyroid disease Sister    Thyroid cancer Daughter    Breast cancer Neg Hx    Colon cancer Neg Hx    Social History   Socioeconomic History   Marital status: Divorced    Spouse name: Not on file   Number of children: 2   Years of education: Not on file   Highest education level: Not on file  Occupational History   Occupation: retired  Tobacco Use   Smoking status: Former    Packs/day: 0.50    Years: 20.00    Additional pack years: 0.00    Total pack years: 10.00    Types: Cigarettes    Quit date: 07/26/1984    Years since quitting: 38.3   Smokeless tobacco: Never   Tobacco comments:    quit smoking years ago  Vaping Use   Vaping Use: Never used  Substance and Sexual Activity   Alcohol use: No    Alcohol/week: 0.0 standard drinks of alcohol   Drug use: No   Sexual activity: Never  Other Topics Concern   Not on file  Social History Narrative   Not on file   Social Determinants of Health   Financial Resource Strain: Low Risk  (05/07/2022)   Overall Financial Resource Strain (CARDIA)    Difficulty of Paying Living Expenses: Not hard at all  Food Insecurity: No Food Insecurity (05/07/2022)   Hunger Vital Sign    Worried About Running Out of Food in the Last Year: Never true    Ran Out of Food in the Last Year: Never true  Transportation Needs: No Transportation Needs (05/07/2022)   PRAPARE - Administrator, Civil Service (Medical): No    Lack of Transportation (Non-Medical): No  Physical Activity: Not on file  Stress: No Stress Concern Present (05/07/2022)   [employer] of Occupational Health - Occupational Stress Questionnaire    Feeling of Stress : Not at all  Social Connections: Unknown (05/07/2022)   Social Connection and Isolation Panel [NHANES]    Frequency of Communication with Friends and Family: More than three times a week    Frequency of Social Gatherings with Friends and Family: Not on file    Attends Religious  Services: Not on file    Active Member of Clubs or Organizations: Not on file    Attends Banker Meetings: Not on file    Marital Status: Not on file     Review of Systems  Constitutional:  Negative for appetite change and unexpected weight change.  HENT:  Negative for congestion and sinus pressure.   Respiratory:  Negative for cough, chest tightness and shortness of breath.   Cardiovascular:  Negative for chest pain and palpitations.  Gastrointestinal:  Negative for abdominal pain, diarrhea, nausea and vomiting.  Genitourinary:  Negative for difficulty urinating and dysuria.  Musculoskeletal:        Knee pain as outlined.  Left shoulder pain as outlined.    Skin:  Negative for color change and rash.  Neurological:  Negative for dizziness and headaches.  Psychiatric/Behavioral:  Negative for agitation and dysphoric mood.        Objective:     BP 128/70   Pulse 83   Temp 98.2 F (36.8 C)   Resp 16   Ht  (1.626 m)   Wt 218 lb 12.8 oz (99.2 kg)   SpO2 97%   BMI 37.56 kg/m  Wt Readings from Last 3 Encounters:  11/11/22 218 lb 12.8 oz (99.2 kg)  09/23/22 218 lb (98.9 kg)  09/22/22 218 lb 9.6 oz (99.2 kg)    Physical Exam Vitals reviewed.  Constitutional:      General: She is not in acute distress.    Appearance: Normal appearance.  HENT:     Head: Normocephalic and atraumatic.     Right Ear: External ear normal.     Left Ear: External ear normal.  Eyes:     General: No scleral icterus.       Right eye: No discharge.        Left eye: No discharge.     Conjunctiva/sclera: Conjunctivae normal.  Neck:     Thyroid: No thyromegaly.  Cardiovascular:     Rate and Rhythm: Normal rate and regular rhythm.  Pulmonary:     Effort: No respiratory distress.     Breath sounds: Normal breath sounds. No wheezing.  Abdominal:     General: Bowel sounds are normal.     Palpations: Abdomen is soft.     Tenderness: There is no abdominal tenderness.   Musculoskeletal:        General: No swelling or tenderness.     Cervical back: Neck supple. No tenderness.     Comments: Limited rom - left shoulder.   Lymphadenopathy:     Cervical: No cervical adenopathy.  Skin:  Findings: No erythema or rash.  Neurological:     Mental Status: She is alert.  Psychiatric:        Mood and Affect: Mood normal.        Behavior: Behavior normal.      Outpatient Encounter Medications as of 11/11/2022  Medication Sig   albuterol (VENTOLIN HFA) 108 (90 Base) MCG/ACT inhaler Inhale 2 puffs into the lungs every 6 (six) hours as needed for wheezing or shortness of breath.   aspirin 81 MG tablet Take 81 mg by mouth daily.   benazepril (LOTENSIN) 40 MG tablet Take 1 tablet (40 mg total) by mouth daily.   budesonide-formoterol (SYMBICORT) 80-4.5 MCG/ACT inhaler Inhale 2 puffs into the lungs in the morning and at bedtime.   cetirizine (ZYRTEC) 10 MG tablet Take 10 mg by mouth daily.   cyanocobalamin (VITAMIN B12) 1000 MCG/ML injection INJECT 1 ML INTRAMUSCULARLY  ONCE EVERY MONTH   fluticasone (FLONASE) 50 MCG/ACT nasal spray Place 1 spray into both nostrils daily.   glipiZIDE (GLUCOTROL XL) 2.5 MG 24 hr tablet Take 1 tablet (2.5 mg total) by mouth daily with breakfast.   glucose blood (CONTOUR TEST) test strip USE 1 STRIP TO CHECK GLUCOSE ONCE DAILY DX  E11 9 DIABETES MELLITUS   hydrochlorothiazide (HYDRODIURIL) 25 MG tablet Take 1/2 (one-half) tablet by mouth once daily   Lancets MISC Check sugars once a day Dx: E11.9 (Contour Next)   lovastatin (MEVACOR) 10 MG tablet Take one tablet on Monday and Thursday.   niacin 500 MG tablet Take 500 mg by mouth at bedtime.   nystatin cream (MYCOSTATIN) Apply 1 application. topically 2 (two) times daily.   omeprazole (PRILOSEC) 20 MG capsule Take 20 mg by mouth daily.   Syringe/Needle, Disp, (SYRINGE 3CC/25GX1") 25G X 1" 3 ML MISC Use as directed to administer b12 injections.   No facility-administered encounter  medications on file as of 11/11/2022.     Lab Results  Component Value Date   WBC 7.9 11/09/2022   HGB 12.6 11/09/2022   HCT 39.1 11/09/2022   PLT 322.0 11/09/2022   GLUCOSE 156 (H) 11/09/2022   CHOL 265 (H) 11/09/2022   TRIG 163.0 (H) 11/09/2022   HDL 54.10 11/09/2022   LDLDIRECT 150.0 12/31/2021   LDLCALC 178 (H) 11/09/2022   ALT 22 11/09/2022   AST 18 11/09/2022   NA 141 11/09/2022   K 4.6 11/09/2022   CL 100 11/09/2022   CREATININE 0.89 11/09/2022   BUN 20 11/09/2022   CO2 34 (H) 11/09/2022   TSH 3.34 05/04/2022   INR 0.9 04/03/2012   HGBA1C 7.5 (H) 11/09/2022   MICROALBUR <0.7 04/01/2022    DG ESOPHAGUS W DOUBLE CM (HD)  Result Date: 09/29/2022 CLINICAL DATA:  Patient history of gastroesophageal reflux with chronic cough. Request is for double esophagram for further evaluation EXAM: ESOPHAGUS/BARIUM SWALLOW/TABLET STUDY TECHNIQUE: Combined double and single contrast examination was performed using effervescent crystals, high-density barium, and thin liquid barium. This exam was performed by Anders Grant NP and was supervised and interpreted by Caprice Renshaw, MD FLUOROSCOPY: Radiation Exposure Index (as provided by the fluoroscopic device): 20.50 mGy Kerma COMPARISON:  None Available. FINDINGS: Swallowing: No vestibular penetration or aspiration seen. Pharynx: Unremarkable. Esophagus: No obvious mucosal abnormality.  No visible ulceration. Esophageal motility: Mild esophageal dysmotility. Hiatal Hernia: Tiny sliding-type hiatal hernia. Gastroesophageal reflux: Spontaneous moderate gastroesophageal reflux Ingested 13mm barium tablet: Passed normally Other: None. IMPRESSION: Spontaneous moderate gastroesophageal reflux. Tiny sliding type hiatal hernia. Mild esophageal dysmotility.  No evidence of stricture. Electronically Signed   By: Caprice Renshaw M.D.   On: 09/29/2022 13:17       Assessment & Plan:  Type 2 diabetes mellitus with hyperglycemia, without long-term current use of  insulin Assessment & Plan: Since being off metformin, diarrhea has resolved.  Held on trying GLP 1 agonist, given concern regarding previous GI issues.  Did not tolerate jardiance.  a1c elevated.  On glipizide XL 2.5mg  q day.  A1c 7.5.  ok - 82 y/o. Follow sugars.  Low carb diet and exercise.     Orders: -     Hemoglobin A1c; Future -     Basic metabolic panel; Future  Aortic atherosclerosis Assessment & Plan: Intolerant to statins and zetia.  Off repatha.  Low cholesterol diet and exercise.  Follow lipid panel and liver function tests.   Lab Results  Component Value Date   CHOL 265 (H) 11/09/2022   HDL 54.10 11/09/2022   LDLCALC 178 (H) 11/09/2022   LDLDIRECT 150.0 12/31/2021   TRIG 163.0 (H) 11/09/2022   CHOLHDL 5 11/09/2022     Gastroesophageal reflux disease, unspecified whether esophagitis present Assessment & Plan: Continue prilosec. Follow.    Hypercholesterolemia Assessment & Plan: Intolerant to statins, zetia and repatha.  On no medication now. Did not tolerate lovastatin.  Low cholesterol diet and exercise.  Follow lipid panel and liver function tests.   Orders: -     Lipid panel; Future -     Hepatic function panel; Future  Primary hypertension Assessment & Plan: Currently on benazepril and hctz. Follow pressures.  Follow metabolic panel.    Statin myopathy Assessment & Plan: Intolerant to statin medication as outlined.    Left shoulder pain, unspecified chronicity Assessment & Plan: Increased left shoulder pain as outlined.  Limited rom. Refer to ortho for further evaluation and treatment.    Orders: -     Ambulatory referral to Orthopedic Surgery  Pain in both knees, unspecified chronicity Assessment & Plan: Limits activity.  Refer to PT for further evaluation and treatment.    Orders: -     Ambulatory referral to Physical Therapy     Dale [place], MD

## 2022-11-15 ENCOUNTER — Encounter: Payer: Self-pay | Admitting: Internal Medicine

## 2022-11-15 DIAGNOSIS — M25569 Pain in unspecified knee: Secondary | ICD-10-CM | POA: Insufficient documentation

## 2022-11-15 NOTE — Assessment & Plan Note (Signed)
Intolerant to statin medication as outlined.  

## 2022-11-15 NOTE — Assessment & Plan Note (Signed)
Intolerant to statins and zetia.  Off repatha.  Low cholesterol diet and exercise.  Follow lipid panel and liver function tests.   Lab Results  Component Value Date   CHOL 265 (H) 11/09/2022   HDL 54.10 11/09/2022   LDLCALC 178 (H) 11/09/2022   LDLDIRECT 150.0 12/31/2021   TRIG 163.0 (H) 11/09/2022   CHOLHDL 5 11/09/2022

## 2022-11-15 NOTE — Assessment & Plan Note (Signed)
Limits activity.  Refer to PT for further evaluation and treatment.

## 2022-11-15 NOTE — Assessment & Plan Note (Signed)
Intolerant to statins, zetia and repatha.  On no medication now. Did not tolerate lovastatin.  Low cholesterol diet and exercise.  Follow lipid panel and liver function tests.  

## 2022-11-15 NOTE — Assessment & Plan Note (Signed)
Increased left shoulder pain as outlined.  Limited rom. Refer to ortho for further evaluation and treatment.

## 2022-11-15 NOTE — Assessment & Plan Note (Signed)
Since being off metformin, diarrhea has resolved.  Held on trying GLP 1 agonist, given concern regarding previous GI issues.  Did not tolerate jardiance.  a1c elevated.  On glipizide XL 2.5mg  q day.  A1c 7.5.  ok - 82 y/o. Follow sugars.  Low carb diet and exercise.

## 2022-11-15 NOTE — Assessment & Plan Note (Signed)
Continue prilosec. Follow.  

## 2022-11-15 NOTE — Assessment & Plan Note (Signed)
Currently on benazepril and hctz. Follow pressures.  Follow metabolic panel.

## 2022-11-24 ENCOUNTER — Other Ambulatory Visit: Payer: Self-pay | Admitting: Internal Medicine

## 2022-11-26 ENCOUNTER — Encounter: Payer: Self-pay | Admitting: Internal Medicine

## 2022-11-28 ENCOUNTER — Other Ambulatory Visit: Payer: Self-pay | Admitting: Internal Medicine

## 2022-11-28 ENCOUNTER — Encounter: Payer: Self-pay | Admitting: Internal Medicine

## 2022-11-30 MED ORDER — CONTOUR TEST VI STRP: ORAL_STRIP | 0 refills | Status: DC

## 2022-12-02 DIAGNOSIS — I6523 Occlusion and stenosis of bilateral carotid arteries: Secondary | ICD-10-CM | POA: Diagnosis not present

## 2022-12-02 DIAGNOSIS — I361 Nonrheumatic tricuspid (valve) insufficiency: Secondary | ICD-10-CM | POA: Diagnosis not present

## 2022-12-02 DIAGNOSIS — Z87891 Personal history of nicotine dependence: Secondary | ICD-10-CM | POA: Diagnosis not present

## 2022-12-02 DIAGNOSIS — E119 Type 2 diabetes mellitus without complications: Secondary | ICD-10-CM | POA: Diagnosis not present

## 2022-12-02 DIAGNOSIS — I10 Essential (primary) hypertension: Secondary | ICD-10-CM | POA: Diagnosis not present

## 2022-12-02 DIAGNOSIS — Z7982 Long term (current) use of aspirin: Secondary | ICD-10-CM | POA: Diagnosis not present

## 2022-12-02 DIAGNOSIS — R0789 Other chest pain: Secondary | ICD-10-CM | POA: Diagnosis not present

## 2022-12-02 DIAGNOSIS — E785 Hyperlipidemia, unspecified: Secondary | ICD-10-CM | POA: Diagnosis not present

## 2022-12-02 DIAGNOSIS — I340 Nonrheumatic mitral (valve) insufficiency: Secondary | ICD-10-CM | POA: Diagnosis not present

## 2022-12-03 ENCOUNTER — Encounter: Payer: Self-pay | Admitting: Internal Medicine

## 2022-12-03 NOTE — Telephone Encounter (Signed)
LMTCB to discuss. She should take both medications. Dr Lorin Picket agrees that she should add amlodipine to her blood pressure regimen and continue taking her benazepril as well,

## 2022-12-03 NOTE — Telephone Encounter (Signed)
Noted  

## 2022-12-03 NOTE — Telephone Encounter (Signed)
Agree with addition of amlodipine.

## 2022-12-03 NOTE — Telephone Encounter (Signed)
See other note

## 2022-12-03 NOTE — Telephone Encounter (Signed)
Pt called back and I read the message to her and she verbalized understanding 

## 2022-12-04 DIAGNOSIS — H353124 Nonexudative age-related macular degeneration, left eye, advanced atrophic with subfoveal involvement: Secondary | ICD-10-CM | POA: Diagnosis not present

## 2022-12-10 ENCOUNTER — Ambulatory Visit: Payer: Medicare Other | Attending: Internal Medicine

## 2022-12-10 ENCOUNTER — Encounter: Payer: Self-pay | Admitting: Physical Therapy

## 2022-12-10 DIAGNOSIS — M6281 Muscle weakness (generalized): Secondary | ICD-10-CM | POA: Diagnosis not present

## 2022-12-10 DIAGNOSIS — M25561 Pain in right knee: Secondary | ICD-10-CM | POA: Insufficient documentation

## 2022-12-10 DIAGNOSIS — G8929 Other chronic pain: Secondary | ICD-10-CM | POA: Insufficient documentation

## 2022-12-10 DIAGNOSIS — M25562 Pain in left knee: Secondary | ICD-10-CM | POA: Diagnosis not present

## 2022-12-10 NOTE — Therapy (Signed)
OUTPATIENT PHYSICAL THERAPY LOWER EXTREMITY EVALUATION   Patient Name: Misty Jones MRN: [id number] DOB:Aug 10, 1940, 82 y.o., female Today's Date: 12/10/2022  END OF SESSION:  PT End of Session - 12/10/22 1516     Visit Number 1    Number of Visits 17    Date for PT Re-Evaluation 02/04/23    Authorization Type MEDICARE PART A AND B    PT Start Time 1516    PT Stop Time 1600    PT Time Calculation (min) 44 min    Activity Tolerance Patient tolerated treatment well    Behavior During Therapy WFL for tasks assessed/performed             Past Medical History:  Diagnosis Date   B12 deficiency    Chicken pox    Diabetes mellitus (HCC)    GERD (gastroesophageal reflux disease)    History of diverticulitis of colon    History of dysplastic nevus 07/05/2018   left mid back shv - with moderate atypia   Hypercholesterolemia    Hypertension    Impacted cerumen of left ear 07/01/2022   Nasal drainage    Non-recurrent acute suppurative otitis media of right ear without spontaneous rupture of tympanic membrane 06/16/2022   Past Surgical History:  Procedure Laterality Date   ABDOMINAL HYSTERECTOMY  1984   BACK SURGERY  1986   ruptured disc, Dr Vear Clock [facility])   CHOLECYSTECTOMY  1991   CHOLECYSTECTOMY  1992   COLONOSCOPY WITH PROPOFOL N/A 06/29/2016   Procedure: COLONOSCOPY WITH PROPOFOL;  Surgeon: Scot Jun, MD;  Location: [facility] ENDOSCOPY;  Service: Endoscopy;  Laterality: N/A;   ESOPHAGOGASTRODUODENOSCOPY (EGD) WITH PROPOFOL N/A 12/13/2017   Procedure: ESOPHAGOGASTRODUODENOSCOPY (EGD) WITH PROPOFOL;  Surgeon: Scot Jun, MD;  Location: [facility] ENDOSCOPY;  Service: Endoscopy;  Laterality: N/A;   TONSILLECTOMY     Patient Active Problem List   Diagnosis Date Noted   Knee pain 11/15/2022   Statin myopathy 08/15/2022   Acute otitis media with effusion of left ear 07/01/2022   Onychomycosis 05/11/2022   Capsulitis 02/11/2022   Urinary frequency 01/15/2022    Abnormal ECG 09/15/2021   Left hip pain 04/30/2021   Skin lesion 01/18/2021   Leg pain, bilateral 01/18/2021   Numbness in feet 01/18/2021   Elevated TSH 07/24/2020   Adverse effect of statin 03/11/2020   Headache 01/31/2020   B12 deficiency 01/31/2020   Dysuria 08/20/2019   Change in bowel movement 08/20/2019   Nocturia 01/29/2019   Low back pain 12/30/2017   Herpes genitalis 08/29/2017   Dizziness 04/25/2017   Aortic atherosclerosis (HCC) 12/19/2016   Chest pain 09/27/2016   History of colonic polyps 08/09/2016   Abdominal pain 12/04/2015   Neck nodule 03/01/2015   Health care maintenance 11/03/2014   BMI 34.0-34.9,adult 07/01/2014   Cough 02/26/2014   Shoulder pain, left 06/04/2013   Osteopenia 01/25/2013   Hypertension 06/19/2012   GERD (gastroesophageal reflux disease) 06/19/2012   Diabetes mellitus (HCC) 06/07/2012   Hypercholesterolemia 06/07/2012    PCP: Dale [place], MD  REFERRING PROVIDER: Dale , MD   REFERRING DIAG: B knee pain   THERAPY DIAG:  Muscle weakness (generalized)  Chronic pain of both knees  Rationale for Evaluation and Treatment: Rehabilitation  ONSET DATE: 5 years (R started before L)  SUBJECTIVE:   SUBJECTIVE STATEMENT: Patient reports B knee pain for >5 years (R being more painful than L). Unable to ride stationary bike and walk long distances for exercise. Has been upset that  she is unable to participate in exercise due to B knee pain. Going up stairs causes increased pain in B knees.   PERTINENT HISTORY: PMH: HTN, Type 2 DM PAIN:  Are you having pain? Yes: NPRS scale: 5-6/10 at worst, 0/10 on eval  Pain location: B knees (R>L) Pain description: aching  Aggravating factors: walking  Relieving factors: rest at times  PRECAUTIONS: Fall  WEIGHT BEARING RESTRICTIONS: No  FALLS:  Has patient fallen in last 6 months? Yes. Number of falls 1 - fall out of bathtub   LIVING ENVIRONMENT: Lives with: lives alone Lives  in: House/apartment Stairs: Yes: External: 4 steps; none Has following equipment at home: Single point cane  OCCUPATION: retired   PLOF: Independent  PATIENT GOALS: be pain free and knees to feel better   NEXT MD VISIT: 03/17/23  OBJECTIVE:   DIAGNOSTIC FINDINGS: N/A  PATIENT SURVEYS:  FOTO 56  COGNITION: Overall cognitive status: Within functional limits for tasks assessed     SENSATION: WFL  POSTURE: rounded shoulders and forward head  PALPATION: No tenderness   LOWER EXTREMITY ROM:  Active ROM Right eval Left eval  Knee flexion [facility] WFL  Knee extension WFL WFL   (Blank rows = not tested)  LOWER EXTREMITY MMT:  MMT Right eval Left eval  Hip flexion 4 4  Hip extension    Hip abduction 4+ 4+  Hip adduction 4+ 4+  Knee flexion 4+ 4+  Knee extension 5 5  Ankle dorsiflexion 4+ 4+   (Blank rows = not tested)  FUNCTIONAL TESTS:  5 times sit to stand: 17.21 sec 6 minute walk test: 998' standing rest break at last 12 seconds  10 meter walk test: 0.79 m/sec normal; 0.98 m/sec fast   GAIT: Distance walked: 50 feet Assistive device utilized: None Level of assistance: Complete Independence Comments: B knee valgus, decreased stance time bilaterally    TODAY'S TREATMENT:                                                                                                                              DATE: 12/10/22     PATIENT EDUCATION:  Education details: HEP, POC, goals  Person educated: Patient Education method: Explanation, Demonstration, and Handouts Education comprehension: verbalized understanding and returned demonstration  HOME EXERCISE PROGRAM: Access Code: [email or web address] URL: [email or web address] Date: 12/10/2022 Prepared by: Maylon Peppers   Exercises - Seated Long Arc Quad  - 2-3 x daily - 7 x weekly - 3 sets - 10 reps - Seated March  - 2-3 x daily - 7 x weekly - 3 sets - 10 reps - Seated Heel Toe Raises  - 2-3 x daily - 7 x weekly  - 3 sets - 10 reps - Sit to Stand  - 2-3 x daily - 7 x weekly - 3 sets - 10 reps  ASSESSMENT:  CLINICAL IMPRESSION: Patient is a 82 y.o. female who was seen today for physical therapy evaluation and treatment  for B knee pain. Patient presents with B LE weakness, impaired balance, decreased activity tolerance, and difficulty ambulating. Patient wanting to be able to ambulate for exercise but currently limited to ~5-6 minutes of walking prior to requiring seated rest break. Patient completed functional tests and is currently under her age specific norms and indicative of LE muscle weakness and decreased mobility. Patient frequently becomes SOB with activity requiring standing or seated rest breaks. Patient would benefit from skilled PT interventions to address listed deficits to improve QOL and mobility to increase exercise tolerance.     OBJECTIVE IMPAIRMENTS: Abnormal gait, cardiopulmonary status limiting activity, decreased activity tolerance, decreased balance, decreased endurance, difficulty walking, decreased strength, and improper body mechanics.   ACTIVITY LIMITATIONS: bending, sitting, standing, squatting, sleeping, stairs, and transfers  PARTICIPATION LIMITATIONS: cleaning, shopping, community activity, and yard work  PERSONAL FACTORS: Age, Education, Social background, and Time since onset of injury/illness/exacerbation are also affecting patient's functional outcome.   REHAB POTENTIAL: Good  CLINICAL DECISION MAKING: Stable/uncomplicated  EVALUATION COMPLEXITY: Low   GOALS: Goals reviewed with patient? Yes  SHORT TERM GOALS: Target date: 12/24/2022  Patient will be independent in home exercise program to improve strength/mobility for better functional independence with ADLs. Baseline: 5/16: HEP provided Goal status: INITIAL   LONG TERM GOALS: Target date: 02/04/2023  Patient will increase FOTO score by 10 points to demonstrate statistically significant improvement in  mobility and quality of life. Baseline: 5/16: 56/100 Goal status: INITIAL  2.  Patient (> 30 years old) will complete five times sit to stand test in < 14 seconds indicating an increased LE strength and improved balance. Baseline: 5/16: 17.21 seconds Goal status: INITIAL  3.  Patient will increase six minute walk test distance to >1300' for progression to community ambulator and improve gait ability Baseline: 5/16: 998' Goal status: INITIAL  4.  Patient will increase 10 meter walk test to >1.56m/s (leisure pace) and >1.2 m/s (fast pace) as to improve gait speed for better community ambulation and to reduce fall risk Baseline: 5/16: 0.79 m/s (leisure), 0.98 m/s (fast) Goal status: INITIAL   PLAN:  PT FREQUENCY: 1-2x/week  PT DURATION: 8 weeks  PLANNED INTERVENTIONS: Therapeutic exercises, Therapeutic activity, Neuromuscular re-education, Balance training, Gait training, Patient/Family education, Self Care, Joint mobilization, Stair training, Cryotherapy, Moist heat, and Manual therapy  PLAN FOR NEXT SESSION: HEP review, LE strengthening and endurance    Maylon Peppers, PT, DPT Physical Therapist - [facility]  [facility]  12/10/2022, 5:24 PM

## 2022-12-16 ENCOUNTER — Encounter: Payer: Self-pay | Admitting: Internal Medicine

## 2022-12-16 ENCOUNTER — Encounter: Payer: Self-pay | Admitting: Podiatry

## 2022-12-16 ENCOUNTER — Ambulatory Visit: Payer: Medicare Other | Admitting: Physical Therapy

## 2022-12-16 ENCOUNTER — Ambulatory Visit (INDEPENDENT_AMBULATORY_CARE_PROVIDER_SITE_OTHER): Payer: Medicare Other | Admitting: Podiatry

## 2022-12-16 DIAGNOSIS — G5793 Unspecified mononeuropathy of bilateral lower limbs: Secondary | ICD-10-CM

## 2022-12-16 DIAGNOSIS — M778 Other enthesopathies, not elsewhere classified: Secondary | ICD-10-CM

## 2022-12-16 DIAGNOSIS — G5763 Lesion of plantar nerve, bilateral lower limbs: Secondary | ICD-10-CM

## 2022-12-16 DIAGNOSIS — M19072 Primary osteoarthritis, left ankle and foot: Secondary | ICD-10-CM | POA: Diagnosis not present

## 2022-12-16 DIAGNOSIS — M19071 Primary osteoarthritis, right ankle and foot: Secondary | ICD-10-CM

## 2022-12-16 DIAGNOSIS — M79671 Pain in right foot: Secondary | ICD-10-CM | POA: Insufficient documentation

## 2022-12-16 MED ORDER — GABAPENTIN 300 MG PO CAPS: 300.0000 mg | ORAL_CAPSULE | Freq: Every day | ORAL | 3 refills | Status: DC

## 2022-12-16 NOTE — Progress Notes (Signed)
She presents today states that top of her feet her during the daytime and less she has tennis shoes she is unable to wear flats or flip-flops or sandals.  She is stating that she has a different kind of pain in her feet at nighttime where she feels like she has to ring her feet at night.  She states that she can even stand the covers to touch it.  Objective: Vital signs are stable alert and oriented x 3.  Pulses are palpable.  There is no erythema edema salines drainage or odor she still has pain on frontal plane range of motion at the tarsometatarsal joints bilaterally with dorsal exostoses.  She also has pain on palpation to the forefoot though no reproducible pain is noted that stimulates the pain that she has at bedtime.  Assessment: Capsulitis osteoarthritis midfoot bilateral foot right greater than left.  Nocturnal neuropathy.  Plan: Discussed etiology pathology and surgical therapies and will follow-up with her in 1 month in 1 month were going to go ahead and get her injection Repoza dorsum of the foot but we are going to start her today on gabapentin 300 mg 1 p.o. nightly.

## 2022-12-24 ENCOUNTER — Other Ambulatory Visit: Payer: Self-pay | Admitting: Internal Medicine

## 2022-12-24 ENCOUNTER — Encounter: Payer: Medicare Other | Admitting: Physical Therapy

## 2022-12-28 ENCOUNTER — Encounter: Payer: Self-pay | Admitting: Student in an Organized Health Care Education/Training Program

## 2022-12-28 ENCOUNTER — Ambulatory Visit (INDEPENDENT_AMBULATORY_CARE_PROVIDER_SITE_OTHER): Payer: Medicare Other | Admitting: Student in an Organized Health Care Education/Training Program

## 2022-12-28 VITALS — BP 130/80 | HR 99 | Temp 97.9°F | Ht 64.0 in | Wt 217.4 lb

## 2022-12-28 DIAGNOSIS — J4520 Mild intermittent asthma, uncomplicated: Secondary | ICD-10-CM

## 2022-12-28 DIAGNOSIS — K219 Gastro-esophageal reflux disease without esophagitis: Secondary | ICD-10-CM

## 2022-12-28 DIAGNOSIS — R053 Chronic cough: Secondary | ICD-10-CM | POA: Diagnosis not present

## 2022-12-28 NOTE — Progress Notes (Signed)
Synopsis: Referred in for cough by Dale [place], MD  Assessment & Plan:   #Chronic Cough #Mild Intermittent Asthma #Reflux associated cough  The patient is presenting for the evaluation of cough of a few months in duration with associated faint expiratory wheezing and brief improvement with prednisone. The cough is now fully resolved with Symbicort, consistent with cough variant asthma. PFT's showed a component of reversibility in her FEV1. Her workup also included a double contrast esophagogram that showed signs of reflux, which likely exacerbated her symptoms.  Today, we discussed dietary modifications to her diet to help with reflux. I have also asked the patient to resume taking the Symbicort, at least once daily, even if her symptoms are fully controlled. Also asked her to step up the therapy with Symbicort should cough return.  -Continue Symbicort 80-4.5 mcg/act two puffs twice dialy  Return if symptoms worsen or fail to improve.  I spent 30 minutes caring for this patient today, including preparing to see the patient, obtaining a medical history , reviewing a separately obtained history, performing a medically appropriate examination and/or evaluation, counseling and educating the patient/family/caregiver, documenting clinical information in the electronic health record, and independently interpreting results (not separately reported/billed) and communicating results to the patient/family/caregiver  Raechel Chute, MD [facility] 12/28/2022 11:53 AM    End of visit medications:  No orders of the defined types were placed in this encounter.    Current Outpatient Medications:    albuterol (VENTOLIN HFA) 108 (90 Base) MCG/ACT inhaler, Inhale 2 puffs into the lungs every 6 (six) hours as needed for wheezing or shortness of breath., Disp: 18 g, Rfl: 1   aspirin 81 MG tablet, Take 81 mg by mouth daily., Disp: , Rfl:    benazepril (LOTENSIN) 40 MG tablet, Take  1 tablet (40 mg total) by mouth daily., Disp: 90 tablet, Rfl: 1   budesonide-formoterol (SYMBICORT) 80-4.5 MCG/ACT inhaler, Inhale 2 puffs into the lungs in the morning and at bedtime., Disp: 1 each, Rfl: 12   cetirizine (ZYRTEC) 10 MG tablet, Take 10 mg by mouth daily., Disp: , Rfl:    cyanocobalamin (VITAMIN B12) 1000 MCG/ML injection, INJECT 1 ML INTRAMUSCULARLY  ONCE EVERY MONTH, Disp: 12 mL, Rfl: 0   fluticasone (FLONASE) 50 MCG/ACT nasal spray, Place 1 spray into both nostrils daily., Disp: 18.2 mL, Rfl: 11   gabapentin (NEURONTIN) 300 MG capsule, Take 1 capsule (300 mg total) by mouth at bedtime., Disp: 90 capsule, Rfl: 3   glipiZIDE (GLUCOTROL XL) 2.5 MG 24 hr tablet, Take 1 tablet (2.5 mg total) by mouth daily with breakfast., Disp: 90 tablet, Rfl: 1   glucose blood (CONTOUR TEST) test strip, USE 1 STRIP TO CHECK GLUCOSE ONCE DAILY. DX E11.9, Disp: 50 each, Rfl: 0   hydrochlorothiazide (HYDRODIURIL) 25 MG tablet, Take 1/2 (one-half) tablet by mouth once daily, Disp: 90 tablet, Rfl: 0   Lancets MISC, Check sugars once a day Dx: E11.9 (Contour Next), Disp: 100 each, Rfl: 3   lovastatin (MEVACOR) 10 MG tablet, Take one tablet on Monday and Thursday., Disp: 30 tablet, Rfl: 2   niacin 500 MG tablet, Take 500 mg by mouth at bedtime., Disp: , Rfl:    omeprazole (PRILOSEC) 20 MG capsule, Take 20 mg by mouth daily., Disp: , Rfl:    Syringe/Needle, Disp, (SYRINGE 3CC/25GX1") 25G X 1" 3 ML MISC, Use as directed to administer b12 injections., Disp: 50 each, Rfl: 1   amLODipine (NORVASC) 5 MG tablet, Take by  mouth. (Patient not taking: Reported on 12/28/2022), Disp: , Rfl:    nystatin cream (MYCOSTATIN), Apply 1 application. topically 2 (two) times daily. (Patient not taking: Reported on 12/28/2022), Disp: 30 g, Rfl: 0   Subjective:   PATIENT ID: Misty Jones GENDER: female DOB: [birth year], MRN: [id number]  Chief Complaint  Patient presents with   Follow-up    Occ dry cough.     HPI  Misty Jones  is a pleasant 82 year old female presenting to clinic for follow up on cough.  She reports that her symptoms are fully resolved. She'd used the Symbicort and it had helped tremendously. Following resolution of symptoms, she's stopped using the Symbicort. She's had PFT's as well as a swallowing study and is here to discuss results.   She initially reported a cough that started around November of 2023. This was a sporadic cough, happening in fits. The cough was not worse at night. The cough was dry and non-productive. She had no hemoptysis, no fevers, no chills, no night sweats, and no weight loss. She did not report any associated chest pain or chest tightness.    Patient was seen by her primary care physician on multiple occasions for said chief complaint and has received prednisone for it twice. She reports short lived relief before the symptoms come back. She's also had an albuterol that has not provided much relief. She was started on Cetirizine with minimal improvement.   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 No occupational exposures reported. She smoked briefly but quit many years ago.  Ancillary information including prior medications, full medical/surgical/family/social histories, and PFTs (when available) are listed below and have been reviewed.   Review of Systems  Constitutional:  Negative for chills, fever, malaise/fatigue and weight loss.  Respiratory:  Negative for cough, hemoptysis, sputum production, shortness of breath and wheezing.   Cardiovascular:  Negative for chest pain.     Objective:   Vitals:   12/28/22 1137  BP: 130/80  Pulse: 99  Temp: 97.9 F (36.6 C)  TempSrc: Temporal  SpO2: 94%  Weight: 217 lb 6.4 oz (98.6 kg)  Height: 5\' 4"  (1.626 m)   94% on RA BMI Readings from Last 3 Encounters:  12/28/22 37.32 kg/m  11/11/22 37.56 kg/m  09/23/22 37.42 kg/m   Wt Readings from Last 3 Encounters:  12/28/22 217 lb 6.4 oz (98.6 kg)  11/11/22 218 lb 12.8  oz (99.2 kg)  09/23/22 218 lb (98.9 kg)    Physical Exam Constitutional:      General: She is not in acute distress.    Appearance: Normal appearance. She is obese. She is not ill-appearing.  HENT:     Head: Normocephalic.     Nose: Congestion present.     Mouth/Throat:     Mouth: Mucous membranes are moist.  Cardiovascular:     Rate and Rhythm: Normal rate and regular rhythm.     Pulses: Normal pulses.     Heart sounds: Normal heart sounds.  Pulmonary:     Effort: Pulmonary effort is normal.     Breath sounds: No wheezing, rhonchi or rales.  Abdominal:     General: There is distension.     Palpations: Abdomen is soft.  Neurological:     General: No focal deficit present.     Mental Status: She is alert and oriented to person, place, and time. Mental status is at baseline.       Ancillary Information  Past Medical History:  Diagnosis Date   B12 deficiency    Chicken pox    Diabetes mellitus (HCC)    GERD (gastroesophageal reflux disease)    History of diverticulitis of colon    History of dysplastic nevus 07/05/2018   left mid back shv - with moderate atypia   Hypercholesterolemia    Hypertension    Impacted cerumen of left ear 07/01/2022   Nasal drainage    Non-recurrent acute suppurative otitis media of right ear without spontaneous rupture of tympanic membrane 06/16/2022     Family History  Problem Relation Age of Onset   Hypertension Mother    Cancer Mother        Mouth cancer   Heart disease Father        myocardial infarction   Raynaud syndrome Sister    Asthma Sister    Congenital heart disease Sister    Thyroid disease Sister    Thyroid cancer Daughter    Breast cancer Neg Hx    Colon cancer Neg Hx      Past Surgical History:  Procedure Laterality Date   ABDOMINAL HYSTERECTOMY  1984   BACK SURGERY  1986   ruptured disc, Dr Vear Clock [facility])   CHOLECYSTECTOMY  1991   CHOLECYSTECTOMY  1992   COLONOSCOPY WITH PROPOFOL N/A 06/29/2016    Procedure: COLONOSCOPY WITH PROPOFOL;  Surgeon: Scot Jun, MD;  Location: [facility] ENDOSCOPY;  Service: Endoscopy;  Laterality: N/A;   ESOPHAGOGASTRODUODENOSCOPY (EGD) WITH PROPOFOL N/A 12/13/2017   Procedure: ESOPHAGOGASTRODUODENOSCOPY (EGD) WITH PROPOFOL;  Surgeon: Scot Jun, MD;  Location: [facility] ENDOSCOPY;  Service: Endoscopy;  Laterality: N/A;   TONSILLECTOMY      Social History   Socioeconomic History   Marital status: Divorced    Spouse name: Not on file   Number of children: 2   Years of education: Not on file   Highest education level: Not on file  Occupational History   Occupation: retired  Tobacco Use   Smoking status: Former    Packs/day: 0.50    Years: 20.00    Additional pack years: 0.00    Total pack years: 10.00    Types: Cigarettes    Quit date: 07/26/1984    Years since quitting: 38.4   Smokeless tobacco: Never   Tobacco comments:    quit smoking years ago  Vaping Use   Vaping Use: Never used  Substance and Sexual Activity   Alcohol use: No    Alcohol/week: 0.0 standard drinks of alcohol   Drug use: No   Sexual activity: Never  Other Topics Concern   Not on file  Social History Narrative   Not on file   Social Determinants of Health   Financial Resource Strain: Low Risk  (05/07/2022)   Overall Financial Resource Strain (CARDIA)    Difficulty of Paying Living Expenses: Not hard at all  Food Insecurity: No Food Insecurity (05/07/2022)   Hunger Vital Sign    Worried About Running Out of Food in the Last Year: Never true    Ran Out of Food in the Last Year: Never true  Transportation Needs: No Transportation Needs (05/07/2022)   PRAPARE - Administrator, Civil Service (Medical): No    Lack of Transportation (Non-Medical): No  Physical Activity: Not on file  Stress: No Stress Concern Present (05/07/2022)   [employer] of Occupational Health - Occupational Stress Questionnaire    Feeling of Stress : Not at all  Social  Connections:  Unknown (05/07/2022)   Social Connection and Isolation Panel [NHANES]    Frequency of Communication with Friends and Family: More than three times a week    Frequency of Social Gatherings with Friends and Family: Not on file    Attends Religious Services: Not on file    Active Member of Clubs or Organizations: Not on file    Attends Club or Organization Meetings: Not on file    Marital Status: Not on file  Intimate Partner Violence: Not At Risk (05/07/2022)   Humiliation, Afraid, Rape, and Kick questionnaire    Fear of Current or Ex-Partner: No    Emotionally Abused: No    Physically Abused: No    Sexually Abused: No     Allergies  Allergen Reactions   Augmentin [Amoxicillin-Pot Clavulanate] Other (See Comments)    vomiting   Protonix [Pantoprazole Sodium]      CBC    Component Value Date/Time   WBC 7.9 11/09/2022 0839   RBC 4.25 11/09/2022 0839   HGB 12.6 11/09/2022 0839   HGB 12.8 04/03/2012 0133   HCT 39.1 11/09/2022 0839   HCT 39.4 04/03/2012 0133   PLT 322.0 11/09/2022 0839   PLT 340 04/03/2012 0133   MCV 92.1 11/09/2022 0839   MCV 90 04/03/2012 0133   MCH 30.3 09/13/2016 0755   MCHC 32.3 11/09/2022 0839   RDW 15.1 11/09/2022 0839   RDW 14.0 04/03/2012 0133   LYMPHSABS 2.3 11/09/2022 0839   MONOABS 0.6 11/09/2022 0839   EOSABS 0.2 11/09/2022 0839   BASOSABS 0.1 11/09/2022 0839    Pulmonary Functions Testing Results:    Latest Ref Rng & Units 10/20/2022    1:36 PM  PFT Results  FVC-Pre L 2.01   FVC-Predicted Pre % 79   FVC-Post L 2.14   FVC-Predicted Post % 84   Pre FEV1/FVC % % 71   Post FEV1/FCV % % 72   FEV1-Pre L 1.42   FEV1-Predicted Pre % 75   FEV1-Post L 1.55   DLCO uncorrected ml/min/mmHg 19.91   DLCO UNC% % 106   DLVA Predicted % 112   TLC L 4.51   TLC % Predicted % 89   RV % Predicted % 84     Outpatient Medications Prior to Visit  Medication Sig Dispense Refill   albuterol (VENTOLIN HFA) 108 (90 Base) MCG/ACT inhaler  Inhale 2 puffs into the lungs every 6 (six) hours as needed for wheezing or shortness of breath. 18 g 1   aspirin 81 MG tablet Take 81 mg by mouth daily.     benazepril (LOTENSIN) 40 MG tablet Take 1 tablet (40 mg total) by mouth daily. 90 tablet 1   budesonide-formoterol (SYMBICORT) 80-4.5 MCG/ACT inhaler Inhale 2 puffs into the lungs in the morning and at bedtime. 1 each 12   cetirizine (ZYRTEC) 10 MG tablet Take 10 mg by mouth daily.     cyanocobalamin (VITAMIN B12) 1000 MCG/ML injection INJECT 1 ML INTRAMUSCULARLY  ONCE EVERY MONTH 12 mL 0   fluticasone (FLONASE) 50 MCG/ACT nasal spray Place 1 spray into both nostrils daily. 18.2 mL 11   gabapentin (NEURONTIN) 300 MG capsule Take 1 capsule (300 mg total) by mouth at bedtime. 90 capsule 3   glipiZIDE (GLUCOTROL XL) 2.5 MG 24 hr tablet Take 1 tablet (2.5 mg total) by mouth daily with breakfast. 90 tablet 1   glucose blood (CONTOUR TEST) test strip USE 1 STRIP TO CHECK GLUCOSE ONCE DAILY. DX E11.9 50 each 0  hydrochlorothiazide (HYDRODIURIL) 25 MG tablet Take 1/2 (one-half) tablet by mouth once daily 90 tablet 0   Lancets MISC Check sugars once a day Dx: E11.9 (Contour Next) 100 each 3   lovastatin (MEVACOR) 10 MG tablet Take one tablet on Monday and Thursday. 30 tablet 2   niacin 500 MG tablet Take 500 mg by mouth at bedtime.     omeprazole (PRILOSEC) 20 MG capsule Take 20 mg by mouth daily.     Syringe/Needle, Disp, (SYRINGE 3CC/25GX1") 25G X 1" 3 ML MISC Use as directed to administer b12 injections. 50 each 1   amLODipine (NORVASC) 5 MG tablet Take by mouth. (Patient not taking: Reported on 12/28/2022)     nystatin cream (MYCOSTATIN) Apply 1 application. topically 2 (two) times daily. (Patient not taking: Reported on 12/28/2022) 30 g 0   No facility-administered medications prior to visit.

## 2022-12-30 ENCOUNTER — Encounter: Payer: Self-pay | Admitting: Physical Therapy

## 2022-12-30 ENCOUNTER — Ambulatory Visit: Payer: Medicare Other | Attending: Internal Medicine | Admitting: Physical Therapy

## 2022-12-30 DIAGNOSIS — M25561 Pain in right knee: Secondary | ICD-10-CM | POA: Diagnosis not present

## 2022-12-30 DIAGNOSIS — M6281 Muscle weakness (generalized): Secondary | ICD-10-CM | POA: Insufficient documentation

## 2022-12-30 DIAGNOSIS — G8929 Other chronic pain: Secondary | ICD-10-CM | POA: Diagnosis not present

## 2022-12-30 DIAGNOSIS — M25562 Pain in left knee: Secondary | ICD-10-CM | POA: Diagnosis not present

## 2022-12-30 NOTE — Therapy (Signed)
OUTPATIENT PHYSICAL THERAPY TREATMENT NOTE   Patient Name: Misty Jones MRN: [id number] DOB:12-31-1940, 82 y.o., female Today's Date: 12/30/2022  PCP: Dr. Dale Green Grass  REFERRING PROVIDER: Dr. Dale Clayton   END OF SESSION:   PT End of Session - 12/30/22 1346     Visit Number 2    Number of Visits 17    Date for PT Re-Evaluation 02/04/23    Authorization Type MEDICARE PART A AND B    Authorization - Visit Number 1    Authorization - Number of Visits 20    Progress Note Due on Visit 10    PT Start Time 1345    PT Stop Time 1430    PT Time Calculation (min) 45 min    Activity Tolerance Patient tolerated treatment well    Behavior During Therapy WFL for tasks assessed/performed             Past Medical History:  Diagnosis Date   B12 deficiency    Chicken pox    Diabetes mellitus (HCC)    GERD (gastroesophageal reflux disease)    History of diverticulitis of colon    History of dysplastic nevus 07/05/2018   left mid back shv - with moderate atypia   Hypercholesterolemia    Hypertension    Impacted cerumen of left ear 07/01/2022   Nasal drainage    Non-recurrent acute suppurative otitis media of right ear without spontaneous rupture of tympanic membrane 06/16/2022   Past Surgical History:  Procedure Laterality Date   ABDOMINAL HYSTERECTOMY  1984   BACK SURGERY  1986   ruptured disc, Dr Vear Clock [facility])   CHOLECYSTECTOMY  1991   CHOLECYSTECTOMY  1992   COLONOSCOPY WITH PROPOFOL N/A 06/29/2016   Procedure: COLONOSCOPY WITH PROPOFOL;  Surgeon: Scot Jun, MD;  Location: [facility] ENDOSCOPY;  Service: Endoscopy;  Laterality: N/A;   ESOPHAGOGASTRODUODENOSCOPY (EGD) WITH PROPOFOL N/A 12/13/2017   Procedure: ESOPHAGOGASTRODUODENOSCOPY (EGD) WITH PROPOFOL;  Surgeon: Scot Jun, MD;  Location: [facility] ENDOSCOPY;  Service: Endoscopy;  Laterality: N/A;   TONSILLECTOMY     Patient Active Problem List   Diagnosis Date Noted   Foot pain, bilateral 12/16/2022    Knee pain 11/15/2022   Statin myopathy 08/15/2022   Acute otitis media with effusion of left ear 07/01/2022   Onychomycosis 05/11/2022   Capsulitis 02/11/2022   Urinary frequency 01/15/2022   Abnormal ECG 09/15/2021   Left hip pain 04/30/2021   Skin lesion 01/18/2021   Leg pain, bilateral 01/18/2021   Numbness in feet 01/18/2021   Elevated TSH 07/24/2020   Adverse effect of statin 03/11/2020   Headache 01/31/2020   B12 deficiency 01/31/2020   Dysuria 08/20/2019   Change in bowel movement 08/20/2019   Nocturia 01/29/2019   Low back pain 12/30/2017   Herpes genitalis 08/29/2017   Dizziness 04/25/2017   Aortic atherosclerosis (HCC) 12/19/2016   Chest pain 09/27/2016   History of colonic polyps 08/09/2016   Abdominal pain 12/04/2015   Neck nodule 03/01/2015   Health care maintenance 11/03/2014   BMI 34.0-34.9,adult 07/01/2014   Cough 02/26/2014   Shoulder pain, left 06/04/2013   Osteopenia 01/25/2013   Hypertension 06/19/2012   GERD (gastroesophageal reflux disease) 06/19/2012   Diabetes mellitus (HCC) 06/07/2012   Hypercholesterolemia 06/07/2012    REFERRING DIAG: B knee pain   THERAPY DIAG:  Chronic pain of both knees  Muscle weakness (generalized)  Rationale for Evaluation and Treatment Rehabilitation  PERTINENT HISTORY: Patient reports B knee pain for >5 years (R  being more painful than L). Unable to ride stationary bike and walk long distances for exercise. Has been upset that she is unable to participate in exercise due to B knee pain. Going up stairs causes increased pain in B knees.   PRECAUTIONS: Falls   SUBJECTIVE:                                                                                                                                                                                      SUBJECTIVE STATEMENT:  Pt reports that she is feeling more pain in the medial side of her left knee and she feels this more when going up and down steps. She does not  have a lot of steps to go up and down at her house. Pt reports seeing physician recently who evaluated her for neuropathy in her feet.    PAIN:  Are you having pain? Yes: NPRS scale: 2/10 Pain location: Medial side of Left knee  Pain description: Achy Aggravating factors: Negotiating Stairs  Relieving factors: Not negotiating stairs and sitting still     DIAGNOSTIC FINDINGS: N/A   PATIENT SURVEYS:  FOTO 56   COGNITION: Overall cognitive status: Within functional limits for tasks assessed                         SENSATION: WFL   POSTURE: rounded shoulders and forward head   PALPATION: No tenderness    LOWER EXTREMITY ROM:   Active ROM Right eval Left eval  Knee flexion [facility] WFL  Knee extension WFL WFL   (Blank rows = not tested)   LOWER EXTREMITY MMT:   MMT Right eval Left eval  Hip flexion 4 4  Hip extension      Hip abduction 4+ 4+  Hip adduction 4+ 4+  Knee flexion 4+ 4+  Knee extension 5 5  Ankle dorsiflexion 4+ 4+   (Blank rows = not tested)   FUNCTIONAL TESTS:  5 times sit to stand: 17.21 sec 6 minute walk test: 998' standing rest break at last 12 seconds  10 meter walk test: 0.79 m/sec normal; 0.98 m/sec fast    GAIT: Distance walked: 50 feet Assistive device utilized: None Level of assistance: Complete Independence Comments: B knee valgus, decreased stance time bilaterally      TODAY'S TREATMENT:  DATE:   12/30/22: Nu-Step with seat at 6 and resistance 2 for 5 min  Sit to Stand from 18 inch 1 x 10  -Pt reports increased medial knee pain  Sit to Stand from 20 inch  Mini-Squats with BUE support 3 x 10  -mod VC for correct performance of exercise  Heel and Toes Raises with BUE support 1 x 10 Heel and Toes Raises with 1 UE support 1 x 10     12/10/22     PATIENT EDUCATION:  Education details: HEP, POC, goals   Person educated: Patient Education method: Explanation, Demonstration, and Handouts Education comprehension: verbalized understanding and returned demonstration   HOME EXERCISE PROGRAM: Access Code: [email or web address] URL: [email or web address] Date: 12/30/2022 Prepared by: Ellin Goodie  Exercises - Long Sitting Calf Stretch with Strap  - 1 x daily - 3 reps - 30-60 sec  hold - Seated Hamstring Stretch  - 1 x daily - 3 reps - 30-60 sec  hold - Mini Squat  - 3-4 x weekly - 3 sets - 10 reps - Heel Toe Raises with Counter Support  - 3-4 x weekly - 3 sets - 15 reps   ASSESSMENT:   CLINICAL IMPRESSION:           Pt continues to show increased pain with increased knee flexion. HEP modified include mini-squat to 90 deg hip flexion to avoid increased knee flexion. She was able to tolerate all exercises without an increase in her pain. She will continue to benefit from skilled PT interventions to address listed deficits to improve QOL and mobility to increase exercise tolerance.    OBJECTIVE IMPAIRMENTS: Abnormal gait, cardiopulmonary status limiting activity, decreased activity tolerance, decreased balance, decreased endurance, difficulty walking, decreased strength, and improper body mechanics.    ACTIVITY LIMITATIONS: bending, sitting, standing, squatting, sleeping, stairs, and transfers   PARTICIPATION LIMITATIONS: cleaning, shopping, community activity, and yard work   PERSONAL FACTORS: Age, Education, Social background, and Time since onset of injury/illness/exacerbation are also affecting patient's functional outcome.    REHAB POTENTIAL: Good   CLINICAL DECISION MAKING: Stable/uncomplicated   EVALUATION COMPLEXITY: Low     GOALS: Goals reviewed with patient? Yes   SHORT TERM GOALS: Target date: 12/24/2022   Patient will be independent in home exercise program to improve strength/mobility for better functional independence with ADLs. Baseline: 5/16: HEP provided Goal  status: ONGOING      LONG TERM GOALS: Target date: 02/04/2023   Patient will increase FOTO score by 10 points to demonstrate statistically significant improvement in mobility and quality of life. Baseline: 5/16: 56/100 Goal status: ONGOING    2.  Patient (> 52 years old) will complete five times sit to stand test in < 14 seconds indicating an increased LE strength and improved balance. Baseline: 5/16: 17.21 seconds Goal status: ONGOING     3.  Patient will increase six minute walk test distance to >1300' for progression to community ambulator and improve gait ability Baseline: 5/16: 998' Goal status: ONGOING    4.  Patient will increase 10 meter walk test to >1.31m/s (leisure pace) and >1.2 m/s (fast pace) as to improve gait speed for better community ambulation and to reduce fall risk Baseline: 5/16: 0.79 m/s (leisure), 0.98 m/s (fast) Goal status: ONGOING      PLAN:   PT FREQUENCY: 1-2x/week   PT DURATION: 8 weeks   PLANNED INTERVENTIONS: Therapeutic exercises, Therapeutic activity, Neuromuscular re-education, Balance training, Gait training, Patient/Family education, Self Care, Joint mobilization, Stair training, Cryotherapy, Moist  heat, and Manual therapy   PLAN FOR NEXT SESSION: Continue to progress knee and hip strengthening exercises.    Ellin Goodie PT, DPT  12/30/2022, 1:47 PM

## 2023-01-05 ENCOUNTER — Ambulatory Visit: Payer: Medicare Other | Admitting: Physical Therapy

## 2023-01-06 DIAGNOSIS — D2261 Melanocytic nevi of right upper limb, including shoulder: Secondary | ICD-10-CM | POA: Diagnosis not present

## 2023-01-06 DIAGNOSIS — L820 Inflamed seborrheic keratosis: Secondary | ICD-10-CM | POA: Diagnosis not present

## 2023-01-06 DIAGNOSIS — L298 Other pruritus: Secondary | ICD-10-CM | POA: Diagnosis not present

## 2023-01-06 DIAGNOSIS — B078 Other viral warts: Secondary | ICD-10-CM | POA: Diagnosis not present

## 2023-01-06 DIAGNOSIS — D2262 Melanocytic nevi of left upper limb, including shoulder: Secondary | ICD-10-CM | POA: Diagnosis not present

## 2023-01-06 DIAGNOSIS — D2271 Melanocytic nevi of right lower limb, including hip: Secondary | ICD-10-CM | POA: Diagnosis not present

## 2023-01-06 DIAGNOSIS — L570 Actinic keratosis: Secondary | ICD-10-CM | POA: Diagnosis not present

## 2023-01-06 DIAGNOSIS — R208 Other disturbances of skin sensation: Secondary | ICD-10-CM | POA: Diagnosis not present

## 2023-01-06 DIAGNOSIS — D225 Melanocytic nevi of trunk: Secondary | ICD-10-CM | POA: Diagnosis not present

## 2023-01-06 DIAGNOSIS — D485 Neoplasm of uncertain behavior of skin: Secondary | ICD-10-CM | POA: Diagnosis not present

## 2023-01-06 DIAGNOSIS — L538 Other specified erythematous conditions: Secondary | ICD-10-CM | POA: Diagnosis not present

## 2023-01-10 ENCOUNTER — Other Ambulatory Visit: Payer: Self-pay | Admitting: Internal Medicine

## 2023-01-11 DIAGNOSIS — M19012 Primary osteoarthritis, left shoulder: Secondary | ICD-10-CM | POA: Diagnosis not present

## 2023-01-11 DIAGNOSIS — E1165 Type 2 diabetes mellitus with hyperglycemia: Secondary | ICD-10-CM | POA: Diagnosis not present

## 2023-01-11 DIAGNOSIS — M7582 Other shoulder lesions, left shoulder: Secondary | ICD-10-CM | POA: Diagnosis not present

## 2023-01-11 DIAGNOSIS — M25512 Pain in left shoulder: Secondary | ICD-10-CM | POA: Diagnosis not present

## 2023-01-20 ENCOUNTER — Ambulatory Visit: Payer: Medicare Other | Admitting: Podiatry

## 2023-01-22 ENCOUNTER — Encounter: Payer: Self-pay | Admitting: Internal Medicine

## 2023-01-22 DIAGNOSIS — E1165 Type 2 diabetes mellitus with hyperglycemia: Secondary | ICD-10-CM

## 2023-01-22 MED ORDER — CONTOUR TEST VI STRP
ORAL_STRIP | 11 refills | Status: DC
Start: 2023-01-22 — End: 2024-01-25

## 2023-01-22 MED ORDER — CYANOCOBALAMIN 1000 MCG/ML IJ SOLN: 1000.0000 ug | INTRAMUSCULAR | 0 refills | Status: DC

## 2023-02-01 ENCOUNTER — Encounter: Payer: Self-pay | Admitting: Internal Medicine

## 2023-02-04 ENCOUNTER — Encounter: Payer: Self-pay | Admitting: Internal Medicine

## 2023-02-04 NOTE — Telephone Encounter (Signed)
Pt scheduled to see Dr Lorin Picket

## 2023-02-05 ENCOUNTER — Encounter: Payer: Self-pay | Admitting: Internal Medicine

## 2023-02-05 ENCOUNTER — Other Ambulatory Visit: Payer: Self-pay | Admitting: Internal Medicine

## 2023-02-05 ENCOUNTER — Telehealth: Payer: Medicare Other | Admitting: Internal Medicine

## 2023-02-05 DIAGNOSIS — J069 Acute upper respiratory infection, unspecified: Secondary | ICD-10-CM

## 2023-02-05 DIAGNOSIS — I10 Essential (primary) hypertension: Secondary | ICD-10-CM

## 2023-02-05 MED ORDER — PREDNISONE 10 MG PO TABS: ORAL_TABLET | ORAL | 0 refills | Status: DC

## 2023-02-05 MED ORDER — AZITHROMYCIN 250 MG PO TABS: ORAL_TABLET | ORAL | 0 refills | Status: AC

## 2023-02-05 NOTE — Telephone Encounter (Signed)
Will order labs and schedule. Discussed with patient.

## 2023-02-05 NOTE — Progress Notes (Signed)
Patient ID: Misty Jones, female   DOB: November 18, 1940, 82 y.o.   MRN: [id number]   Virtual Visit via video Note  I connected with Misty Jones by a video enabled telemedicine application or telephone and verified that I am speaking with the correct person using two identifiers. Location patient: home Location provider: work  Persons participating in the virtual visit: patient, provider  The limitations, risks, security and privacy concerns of performing an evaluation and management service by video and the availability of in person appointments have been discussed.  It has also been discussed with the patient that there may be a patient responsible charge related to this service. The patient expressed understanding and agreed to proceed.   Reason for visit: work in appt.   HPI: Work in with cough and congestion. Sees Dr Aundria Rud.  Diagnosed with cough variant asthma. Using symbicort. Symptoms started a few days ago.  Reports increased sinus/nasal congestion.  Hacking cough. Coughing sporadically. Early am - increased drainage.  Headache. When drains - headache resolves. Sore throat yesterday.  No sore throat today.  Some chest congestion.  Cough is occasionally productive - clear. Started back on symbicort bid - yesterday.  Acid reflux. No vomiting or diarrhea.  No fever.    ROS: See pertinent positives and negatives per HPI.  Past Medical History:  Diagnosis Date   B12 deficiency    Chicken pox    Diabetes mellitus (HCC)    GERD (gastroesophageal reflux disease)    History of diverticulitis of colon    History of dysplastic nevus 07/05/2018   left mid back shv - with moderate atypia   Hypercholesterolemia    Hypertension    Impacted cerumen of left ear 07/01/2022   Nasal drainage    Non-recurrent acute suppurative otitis media of right ear without spontaneous rupture of tympanic membrane 06/16/2022    Past Surgical History:  Procedure Laterality Date   ABDOMINAL HYSTERECTOMY  1984    BACK SURGERY  1986   ruptured disc, Dr Vear Clock [facility])   CHOLECYSTECTOMY  1991   CHOLECYSTECTOMY  1992   COLONOSCOPY WITH PROPOFOL N/A 06/29/2016   Procedure: COLONOSCOPY WITH PROPOFOL;  Surgeon: Scot Jun, MD;  Location: [facility] ENDOSCOPY;  Service: Endoscopy;  Laterality: N/A;   ESOPHAGOGASTRODUODENOSCOPY (EGD) WITH PROPOFOL N/A 12/13/2017   Procedure: ESOPHAGOGASTRODUODENOSCOPY (EGD) WITH PROPOFOL;  Surgeon: Scot Jun, MD;  Location: [facility] ENDOSCOPY;  Service: Endoscopy;  Laterality: N/A;   TONSILLECTOMY      Family History  Problem Relation Age of Onset   Hypertension Mother    Cancer Mother        Mouth cancer   Heart disease Father        myocardial infarction   Raynaud syndrome Sister    Asthma Sister    Congenital heart disease Sister    Thyroid disease Sister    Thyroid cancer Daughter    Breast cancer Neg Hx    Colon cancer Neg Hx     SOCIAL HX: reviewed.    Current Outpatient Medications:    azithromycin (ZITHROMAX) 250 MG tablet, Take 2 tablets on day 1, then 1 tablet daily on days 2 through 5, Disp: 6 tablet, Rfl: 0   predniSONE (DELTASONE) 10 MG tablet, Take 4 tablets x 1 day and then decrease by 1/2 tablet per day until down to zero mg., Disp: 18 tablet, Rfl: 0   albuterol (VENTOLIN HFA) 108 (90 Base) MCG/ACT inhaler, Inhale 2 puffs into the lungs every 6 (six)  hours as needed for wheezing or shortness of breath., Disp: 18 g, Rfl: 1   amLODipine (NORVASC) 5 MG tablet, Take by mouth. (Patient not taking: Reported on 12/28/2022), Disp: , Rfl:    aspirin 81 MG tablet, Take 81 mg by mouth daily., Disp: , Rfl:    benazepril (LOTENSIN) 40 MG tablet, Take 1 tablet (40 mg total) by mouth daily., Disp: 90 tablet, Rfl: 1   budesonide-formoterol (SYMBICORT) 80-4.5 MCG/ACT inhaler, Inhale 2 puffs into the lungs in the morning and at bedtime., Disp: 1 each, Rfl: 12   cetirizine (ZYRTEC) 10 MG tablet, Take 10 mg by mouth daily., Disp: , Rfl:    cyanocobalamin  (VITAMIN B12) 1000 MCG/ML injection, Inject 1 mL (1,000 mcg total) into the muscle every 30 (thirty) days., Disp: 12 mL, Rfl: 0   fluticasone (FLONASE) 50 MCG/ACT nasal spray, Place 1 spray into both nostrils daily., Disp: 18.2 mL, Rfl: 11   gabapentin (NEURONTIN) 300 MG capsule, Take 1 capsule (300 mg total) by mouth at bedtime., Disp: 90 capsule, Rfl: 3   glipiZIDE (GLUCOTROL XL) 2.5 MG 24 hr tablet, Take 1 tablet by mouth once daily with breakfast, Disp: 90 tablet, Rfl: 0   glucose blood (CONTOUR TEST) test strip, USE 1 STRIP TO CHECK GLUCOSE ONCE DAILY, Disp: 50 each, Rfl: 11   hydrochlorothiazide (HYDRODIURIL) 25 MG tablet, Take 1/2 (one-half) tablet by mouth once daily, Disp: 90 tablet, Rfl: 0   Lancets MISC, Check sugars once a day Dx: E11.9 (Contour Next), Disp: 100 each, Rfl: 3   lovastatin (MEVACOR) 10 MG tablet, Take one tablet on Monday and Thursday., Disp: 30 tablet, Rfl: 2   niacin 500 MG tablet, Take 500 mg by mouth at bedtime., Disp: , Rfl:    omeprazole (PRILOSEC) 20 MG capsule, Take 20 mg by mouth daily., Disp: , Rfl:    Syringe/Needle, Disp, (SYRINGE 3CC/25GX1") 25G X 1" 3 ML MISC, Use as directed to administer b12 injections., Disp: 50 each, Rfl: 1  EXAM:  GENERAL: alert, oriented, appears well and in no acute distress  HEENT: atraumatic, conjunttiva clear, no obvious abnormalities on inspection of external nose and ears  NECK: normal movements of the head and neck  LUNGS: on inspection no signs of respiratory distress, breathing rate appears normal, no obvious gross SOB, gasping or wheezing  CV: no obvious cyanosis  PSYCH/NEURO: pleasant and cooperative, no obvious depression or anxiety, speech and thought processing grossly intact  ASSESSMENT AND PLAN:  Discussed the following assessment and plan:  Problem List Items Addressed This Visit     URI (upper respiratory infection) - Primary    Cough and congestion as outlined.  Some nasal congestion and drainage.   Clear mucus.  Saline nasal spray and steroid nasal spray as directed. Prednisone as directed.  Robitussing DM.  Symbicort as directed.  Follow.  Call with update.       Relevant Medications   azithromycin (ZITHROMAX) 250 MG tablet   Hypertension    Currently on benazepril and hctz. Follow pressures.  Follow metabolic panel.        Return if symptoms worsen or fail to improve, for keep scheduled.   I discussed the assessment and treatment plan with the patient. The patient was provided an opportunity to ask questions and all were answered. The patient agreed with the plan and demonstrated an understanding of the instructions.   The patient was advised to call back or seek an in-person evaluation if the symptoms worsen or if the  condition fails to improve as anticipated.   [place], MD

## 2023-02-09 ENCOUNTER — Encounter: Payer: Self-pay | Admitting: Internal Medicine

## 2023-02-09 NOTE — Assessment & Plan Note (Signed)
Currently on benazepril and hctz. Follow pressures.  Follow metabolic panel.

## 2023-02-09 NOTE — Assessment & Plan Note (Signed)
Cough and congestion as outlined.  Some nasal congestion and drainage.  Clear mucus.  Saline nasal spray and steroid nasal spray as directed. Prednisone as directed.  Robitussing DM.  Symbicort as directed.  Follow.  Call with update.

## 2023-02-24 ENCOUNTER — Encounter (INDEPENDENT_AMBULATORY_CARE_PROVIDER_SITE_OTHER): Payer: Self-pay

## 2023-03-02 ENCOUNTER — Encounter: Payer: Self-pay | Admitting: Internal Medicine

## 2023-03-02 NOTE — Telephone Encounter (Signed)
She can try saline nasal spray - flush nose 2x/day.  Nasacort nasal spray - 2 sprays each nostril one time per day.  Do this in the evening.  Also confirm can take mucinex.  If so, mucinex. If persistent problems, contact us.

## 2023-03-02 NOTE — Telephone Encounter (Signed)
Called and spoke with patient. She has been having sinus pressure for about 5 days and now right ear feels full. Confirmed no other symptoms. Used benadryl. Offered patient an OV but she says she did not feel this was necessary because she does not feel bad and is not sick. Patient stated that she would just like to know of some OTC recommendations for sinus pressure that she could try first.

## 2023-03-12 DIAGNOSIS — H353124 Nonexudative age-related macular degeneration, left eye, advanced atrophic with subfoveal involvement: Secondary | ICD-10-CM | POA: Diagnosis not present

## 2023-03-15 ENCOUNTER — Encounter: Payer: Self-pay | Admitting: Internal Medicine

## 2023-03-17 ENCOUNTER — Ambulatory Visit: Payer: Medicare Other | Admitting: Internal Medicine

## 2023-03-18 ENCOUNTER — Other Ambulatory Visit (INDEPENDENT_AMBULATORY_CARE_PROVIDER_SITE_OTHER): Payer: Medicare Other

## 2023-03-18 DIAGNOSIS — E1165 Type 2 diabetes mellitus with hyperglycemia: Secondary | ICD-10-CM

## 2023-03-18 DIAGNOSIS — E7800 Pure hypercholesterolemia, unspecified: Secondary | ICD-10-CM | POA: Diagnosis not present

## 2023-03-18 LAB — LIPID PANEL
Cholesterol: 263 mg/dL — ABNORMAL HIGH (ref 0–200)
HDL: 50.9 mg/dL (ref >39.00)
LDL Cholesterol: 181 mg/dL — ABNORMAL HIGH (ref 0–99)
NonHDL: 212.54
Total CHOL/HDL Ratio: 5
Triglycerides: 158 mg/dL — ABNORMAL HIGH (ref 0.0–149.0)
VLDL: 31.6 mg/dL (ref 0.0–40.0)

## 2023-03-18 LAB — BASIC METABOLIC PANEL WITH GFR
BUN: 12 mg/dL (ref 6–23)
CO2: 35 meq/L — ABNORMAL HIGH (ref 19–32)
Calcium: 8.8 mg/dL (ref 8.4–10.5)
Chloride: 101 meq/L (ref 96–112)
Creatinine, Ser: 0.89 mg/dL (ref 0.40–1.20)
GFR: 60.18 mL/min (ref >60.00)
Glucose, Bld: 111 mg/dL — ABNORMAL HIGH (ref 70–99)
Potassium: 4.5 meq/L (ref 3.5–5.1)
Sodium: 143 meq/L (ref 135–145)

## 2023-03-18 LAB — HEPATIC FUNCTION PANEL
ALT: 17 U/L (ref 0–35)
AST: 19 U/L (ref 0–37)
Albumin: 3.8 g/dL (ref 3.5–5.2)
Alkaline Phosphatase: 63 U/L (ref 39–117)
Bilirubin, Direct: 0.1 mg/dL (ref 0.0–0.3)
Total Bilirubin: 0.5 mg/dL (ref 0.2–1.2)
Total Protein: 6.2 g/dL (ref 6.0–8.3)

## 2023-03-18 LAB — HEMOGLOBIN A1C: Hgb A1c MFr Bld: 7.3 % — ABNORMAL HIGH (ref 4.6–6.5)

## 2023-03-22 ENCOUNTER — Encounter: Payer: Self-pay | Admitting: Internal Medicine

## 2023-03-22 ENCOUNTER — Ambulatory Visit (INDEPENDENT_AMBULATORY_CARE_PROVIDER_SITE_OTHER): Payer: Medicare Other | Admitting: Internal Medicine

## 2023-03-22 VITALS — BP 138/90 | HR 76 | Temp 98.2°F | Ht 64.0 in | Wt 215.0 lb

## 2023-03-22 DIAGNOSIS — E1165 Type 2 diabetes mellitus with hyperglycemia: Secondary | ICD-10-CM

## 2023-03-22 DIAGNOSIS — Z23 Encounter for immunization: Secondary | ICD-10-CM | POA: Diagnosis not present

## 2023-03-22 DIAGNOSIS — I10 Essential (primary) hypertension: Secondary | ICD-10-CM | POA: Diagnosis not present

## 2023-03-22 DIAGNOSIS — Z7984 Long term (current) use of oral hypoglycemic drugs: Secondary | ICD-10-CM

## 2023-03-22 DIAGNOSIS — T466X5A Adverse effect of antihyperlipidemic and antiarteriosclerotic drugs, initial encounter: Secondary | ICD-10-CM

## 2023-03-22 DIAGNOSIS — J4520 Mild intermittent asthma, uncomplicated: Secondary | ICD-10-CM

## 2023-03-22 DIAGNOSIS — I700 Atherosclerosis of aorta: Secondary | ICD-10-CM

## 2023-03-22 DIAGNOSIS — H6121 Impacted cerumen, right ear: Secondary | ICD-10-CM

## 2023-03-22 DIAGNOSIS — G720 Drug-induced myopathy: Secondary | ICD-10-CM

## 2023-03-22 DIAGNOSIS — K219 Gastro-esophageal reflux disease without esophagitis: Secondary | ICD-10-CM | POA: Diagnosis not present

## 2023-03-22 DIAGNOSIS — E7800 Pure hypercholesterolemia, unspecified: Secondary | ICD-10-CM

## 2023-03-22 DIAGNOSIS — E538 Deficiency of other specified B group vitamins: Secondary | ICD-10-CM

## 2023-03-22 MED ORDER — EZETIMIBE 10 MG PO TABS: 10.0000 mg | ORAL_TABLET | Freq: Every day | ORAL | 1 refills | Status: DC

## 2023-03-22 NOTE — Progress Notes (Signed)
Subjective:    Patient ID: Misty Jones, female    DOB: 01/30/1941, 82 y.o.   MRN: [id number]  Patient here for  Chief Complaint  Patient presents with   Medical Management of Chronic Issues    HPI Here for a scheduled follow up - f/u regarding asthma, hypertension and GERD. Sees Dr Donney Dice.  On symbicort. Breathing overall stable.  No increased cough or congestion.  No abdominal pain or bowel change.  Discussed labs.  Discussed A1c 7.3.  improved from last check.  Continue diet and exercise.  Discussed cholesterol.  LDL elevated.  Intolerant to statin medications.  Intolerant to repatha. Could not afford zetia. Will try zetia (again) and see if more affordable.  Request referral for pt assistance with symbicort and zetia.  Persistent issues - right ear - cerumen.  Have tried to irrigate in our office.  Discussed referral to ENT.    Past Medical History:  Diagnosis Date   B12 deficiency    Chicken pox    Diabetes mellitus (HCC)    GERD (gastroesophageal reflux disease)    History of diverticulitis of colon    History of dysplastic nevus 07/05/2018   left mid back shv - with moderate atypia   Hypercholesterolemia    Hypertension    Impacted cerumen of left ear 07/01/2022   Nasal drainage    Non-recurrent acute suppurative otitis media of right ear without spontaneous rupture of tympanic membrane 06/16/2022   Past Surgical History:  Procedure Laterality Date   ABDOMINAL HYSTERECTOMY  1984   BACK SURGERY  1986   ruptured disc, Dr Vear Clock [facility])   CHOLECYSTECTOMY  1991   CHOLECYSTECTOMY  1992   COLONOSCOPY WITH PROPOFOL N/A 06/29/2016   Procedure: COLONOSCOPY WITH PROPOFOL;  Surgeon: Scot Jun, MD;  Location[facility] ENDOSCOPY;  Service: Endoscopy;  Laterality: N/A;   ESOPHAGOGASTRODUODENOSCOPY (EGD) WITH PROPOFOL N/A 12/13/2017   Procedure: ESOPHAGOGASTRODUODENOSCOPY (EGD) WITH PROPOFOL;  Surgeon: Scot Jun, MD;  Location: [facility] ENDOSCOPY;  Service: Endoscopy;   Laterality: N/A;   TONSILLECTOMY     Family History  Problem Relation Age of Onset   Hypertension Mother    Cancer Mother        Mouth cancer   Heart disease Father        myocardial infarction   Raynaud syndrome Sister    Asthma Sister    Congenital heart disease Sister    Thyroid disease Sister    Thyroid cancer Daughter    Breast cancer Neg Hx    Colon cancer Neg Hx    Social History   Socioeconomic History   Marital status: Divorced    Spouse name: Not on file   Number of children: 2   Years of education: Not on file   Highest education level: Not on file  Occupational History   Occupation: retired  Tobacco Use   Smoking status: Former    Current packs/day: 0.00    Average packs/day: 0.5 packs/day for 20.0 years (10.0 ttl pk-yrs)    Types: Cigarettes    Start date: 07/26/1964    Quit date: 07/26/1984    Years since quitting: 38.6   Smokeless tobacco: Never   Tobacco comments:    quit smoking years ago  Vaping Use   Vaping status: Never Used  Substance and Sexual Activity   Alcohol use: No    Alcohol/week: 0.0 standard drinks of alcohol   Drug use: No   Sexual activity: Never  Other Topics Concern  Not on file  Social History Narrative   Not on file   Social Determinants of Health   Financial Resource Strain: Low Risk  (05/07/2022)   Overall Financial Resource Strain (CARDIA)    Difficulty of Paying Living Expenses: Not hard at all  Food Insecurity: No Food Insecurity (05/07/2022)   Hunger Vital Sign    Worried About Running Out of Food in the Last Year: Never true    Ran Out of Food in the Last Year: Never true  Transportation Needs: No Transportation Needs (05/07/2022)   PRAPARE - Administrator, Civil Service (Medical): No    Lack of Transportation (Non-Medical): No  Physical Activity: Not on file  Stress: No Stress Concern Present (05/07/2022)   [employer] of Occupational Health - Occupational Stress Questionnaire    Feeling  of Stress : Not at all  Social Connections: Unknown (05/07/2022)   Social Connection and Isolation Panel [NHANES]    Frequency of Communication with Friends and Family: More than three times a week    Frequency of Social Gatherings with Friends and Family: Not on file    Attends Religious Services: Not on file    Active Member of Clubs or Organizations: Not on file    Attends Banker Meetings: Not on file    Marital Status: Not on file     Review of Systems  Constitutional:  Negative for appetite change and unexpected weight change.  HENT:  Negative for congestion and sinus pressure.        Right ear issues as outlined.  No increased pain.   Respiratory:  Negative for cough, chest tightness and shortness of breath.   Cardiovascular:  Negative for chest pain, palpitations and leg swelling.  Gastrointestinal:  Negative for abdominal pain, diarrhea, nausea and vomiting.  Genitourinary:  Negative for difficulty urinating and dysuria.  Musculoskeletal:  Negative for joint swelling and myalgias.  Skin:  Negative for color change and rash.  Neurological:  Negative for dizziness and headaches.  Psychiatric/Behavioral:  Negative for agitation and dysphoric mood.        Objective:     BP (!) 138/90   Pulse 76   Temp 98.2 F (36.8 C) (Oral)   Ht 5\' 4"  (1.626 m)   Wt 215 lb (97.5 kg)   SpO2 96%   BMI 36.90 kg/m  Wt Readings from Last 3 Encounters:  03/22/23 215 lb (97.5 kg)  12/28/22 217 lb 6.4 oz (98.6 kg)  11/11/22 218 lb 12.8 oz (99.2 kg)    Physical Exam Vitals reviewed.  Constitutional:      General: She is not in acute distress.    Appearance: Normal appearance.  HENT:     Head: Normocephalic and atraumatic.     Right Ear: External ear normal.     Left Ear: External ear normal.     Ears:     Comments: Right ear - cerumen impaction.  Eyes:     General: No scleral icterus.       Right eye: No discharge.        Left eye: No discharge.      Conjunctiva/sclera: Conjunctivae normal.  Neck:     Thyroid: No thyromegaly.  Cardiovascular:     Rate and Rhythm: Normal rate and regular rhythm.  Pulmonary:     Effort: No respiratory distress.     Breath sounds: Normal breath sounds. No wheezing.  Abdominal:     General: Bowel sounds are normal.  Palpations: Abdomen is soft.     Tenderness: There is no abdominal tenderness.  Musculoskeletal:        General: No swelling or tenderness.     Cervical back: Neck supple. No tenderness.  Lymphadenopathy:     Cervical: No cervical adenopathy.  Skin:    Findings: No erythema or rash.  Neurological:     Mental Status: She is alert.  Psychiatric:        Mood and Affect: Mood normal.        Behavior: Behavior normal.      Outpatient Encounter Medications as of 03/22/2023  Medication Sig   albuterol (VENTOLIN HFA) 108 (90 Base) MCG/ACT inhaler Inhale 2 puffs into the lungs every 6 (six) hours as needed for wheezing or shortness of breath.   aspirin 81 MG tablet Take 81 mg by mouth daily.   benazepril (LOTENSIN) 40 MG tablet Take 1 tablet (40 mg total) by mouth daily.   budesonide-formoterol (SYMBICORT) 80-4.5 MCG/ACT inhaler Inhale 2 puffs into the lungs in the morning and at bedtime.   cetirizine (ZYRTEC) 10 MG tablet Take 10 mg by mouth daily.   cyanocobalamin (VITAMIN B12) 1000 MCG/ML injection Inject 1 mL (1,000 mcg total) into the muscle every 30 (thirty) days.   ezetimibe (ZETIA) 10 MG tablet Take 1 tablet (10 mg total) by mouth daily.   fluticasone (FLONASE) 50 MCG/ACT nasal spray Place 1 spray into both nostrils daily.   glipiZIDE (GLUCOTROL XL) 2.5 MG 24 hr tablet Take 1 tablet by mouth once daily with breakfast   glucose blood (CONTOUR TEST) test strip USE 1 STRIP TO CHECK GLUCOSE ONCE DAILY   hydrochlorothiazide (HYDRODIURIL) 25 MG tablet Take 1/2 (one-half) tablet by mouth once daily   Lancets MISC Check sugars once a day Dx: E11.9 (Contour Next)   niacin 500 MG tablet  Take 500 mg by mouth at bedtime.   omeprazole (PRILOSEC) 20 MG capsule Take 20 mg by mouth daily.   Syringe/Needle, Disp, (SYRINGE 3CC/25GX1") 25G X 1" 3 ML MISC Use as directed to administer b12 injections.   [DISCONTINUED] lovastatin (MEVACOR) 10 MG tablet Take one tablet on Monday and Thursday.   [DISCONTINUED] amLODipine (NORVASC) 5 MG tablet Take by mouth. (Patient not taking: Reported on 12/28/2022)   [DISCONTINUED] gabapentin (NEURONTIN) 300 MG capsule Take 1 capsule (300 mg total) by mouth at bedtime. (Patient not taking: Reported on 03/22/2023)   [DISCONTINUED] predniSONE (DELTASONE) 10 MG tablet Take 4 tablets x 1 day and then decrease by 1/2 tablet per day until down to zero mg. (Patient not taking: Reported on 03/22/2023)   No facility-administered encounter medications on file as of 03/22/2023.     Lab Results  Component Value Date   WBC 7.9 11/09/2022   HGB 12.6 11/09/2022   HCT 39.1 11/09/2022   PLT 322.0 11/09/2022   GLUCOSE 111 (H) 03/18/2023   CHOL 263 (H) 03/18/2023   TRIG 158.0 (H) 03/18/2023   HDL 50.90 03/18/2023   LDLDIRECT 150.0 12/31/2021   LDLCALC 181 (H) 03/18/2023   ALT 17 03/18/2023   AST 19 03/18/2023   NA 143 03/18/2023   K 4.5 03/18/2023   CL 101 03/18/2023   CREATININE 0.89 03/18/2023   BUN 12 03/18/2023   CO2 35 (H) 03/18/2023   TSH 3.34 05/04/2022   INR 0.9 04/03/2012   HGBA1C 7.3 (H) 03/18/2023   MICROALBUR <0.7 04/01/2022    DG ESOPHAGUS W DOUBLE CM (HD)  Result Date: 09/29/2022 CLINICAL DATA:  Patient history  of gastroesophageal reflux with chronic cough. Request is for double esophagram for further evaluation EXAM: ESOPHAGUS/BARIUM SWALLOW/TABLET STUDY TECHNIQUE: Combined double and single contrast examination was performed using effervescent crystals, high-density barium, and thin liquid barium. This exam was performed by Anders Grant NP and was supervised and interpreted by Caprice Renshaw, MD FLUOROSCOPY: Radiation Exposure Index (as  provided by the fluoroscopic device): 20.50 mGy Kerma COMPARISON:  None Available. FINDINGS: Swallowing: No vestibular penetration or aspiration seen. Pharynx: Unremarkable. Esophagus: No obvious mucosal abnormality.  No visible ulceration. Esophageal motility: Mild esophageal dysmotility. Hiatal Hernia: Tiny sliding-type hiatal hernia. Gastroesophageal reflux: Spontaneous moderate gastroesophageal reflux Ingested 13mm barium tablet: Passed normally Other: None. IMPRESSION: Spontaneous moderate gastroesophageal reflux. Tiny sliding type hiatal hernia. Mild esophageal dysmotility.  No evidence of stricture. Electronically Signed   By: Caprice Renshaw M.D.   On: 09/29/2022 13:17       Assessment & Plan:  Primary hypertension Assessment & Plan: Currently on benazepril and hctz. Follow pressures.  Follow metabolic panel.   Orders: -     Basic metabolic panel; Future -     Microalbumin / creatinine urine ratio; Future  Type 2 diabetes mellitus with hyperglycemia, without long-term current use of insulin (HCC) Assessment & Plan: Since being off metformin, diarrhea has resolved.  Held on trying GLP 1 agonist, given concern regarding previous GI issues.  Did not tolerate jardiance.  a1c elevated.  On glipizide XL 2.5mg  q day.  A1c improved from last check - 7.3.  ok - 82 y/o. Follow sugars.  Low carb diet and exercise.     Orders: -     Basic metabolic panel; Future -     Hepatic function panel; Future -     Microalbumin / creatinine urine ratio; Future -     Hemoglobin A1c; Future  Hypercholesterolemia Assessment & Plan: Intolerant to statins.  Off repatha.  Low cholesterol diet and exercise.  Follow lipid panel and liver function tests. Discussed treatment.  Agreeable to retry zetia. Felt reason stopped - unable to afford.  Request pt assistance.  Lab Results  Component Value Date   CHOL 263 (H) 03/18/2023   HDL 50.90 03/18/2023   LDLCALC 181 (H) 03/18/2023   LDLDIRECT 150.0 12/31/2021   TRIG  158.0 (H) 03/18/2023   CHOLHDL 5 03/18/2023     Orders: -     Hepatic function panel; Future -     Lipid panel; Future -     AMB Referral to Pharmacy Medication Management  Mild intermittent asthma, unspecified whether complicated -     AMB Referral to Pharmacy Medication Management  Encounter for immunization -     Flu Vaccine Trivalent High Dose (Fluad)  Aortic atherosclerosis (HCC) Assessment & Plan: Intolerant to statins.  Off repatha.  Low cholesterol diet and exercise.  Follow lipid panel and liver function tests. Discussed treatment.  Agreeable to retry zetia. Felt reason stopped - unable to afford.  Request pt assistance.  Lab Results  Component Value Date   CHOL 263 (H) 03/18/2023   HDL 50.90 03/18/2023   LDLCALC 181 (H) 03/18/2023   LDLDIRECT 150.0 12/31/2021   TRIG 158.0 (H) 03/18/2023   CHOLHDL 5 03/18/2023     B12 deficiency Assessment & Plan: Recheck B12 level.    Gastroesophageal reflux disease, unspecified whether esophagitis present Assessment & Plan: Continue prilosec. Follow.    Statin myopathy Assessment & Plan: Intolerant to statin medication as outlined.    Impacted cerumen of right ear Assessment & Plan: Right  ear issues.  Cerumen impaction - right ear. Previously unable to clear.  Refer back to ENT for further treatment.    Other orders -     Ezetimibe; Take 1 tablet (10 mg total) by mouth daily.  Dispense: 90 tablet; Refill: 1     Dale [place], MD

## 2023-03-23 ENCOUNTER — Encounter: Payer: Self-pay | Admitting: Internal Medicine

## 2023-03-23 DIAGNOSIS — H6121 Impacted cerumen, right ear: Secondary | ICD-10-CM

## 2023-03-24 ENCOUNTER — Other Ambulatory Visit: Payer: Self-pay | Admitting: Internal Medicine

## 2023-03-24 DIAGNOSIS — Z1231 Encounter for screening mammogram for malignant neoplasm of breast: Secondary | ICD-10-CM

## 2023-03-25 NOTE — Telephone Encounter (Signed)
Called [facility]. Closed for lunch. Will retry.

## 2023-03-26 NOTE — Telephone Encounter (Signed)
Spoke with pt. She has been scheduled with  ENT 9/26

## 2023-03-27 ENCOUNTER — Encounter: Payer: Self-pay | Admitting: Internal Medicine

## 2023-03-27 NOTE — Assessment & Plan Note (Signed)
Intolerant to statins.  Off repatha.  Low cholesterol diet and exercise.  Follow lipid panel and liver function tests. Discussed treatment.  Agreeable to retry zetia. Felt reason stopped - unable to afford.  Request pt assistance.  Lab Results  Component Value Date   CHOL 263 (H) 03/18/2023   HDL 50.90 03/18/2023   LDLCALC 181 (H) 03/18/2023   LDLDIRECT 150.0 12/31/2021   TRIG 158.0 (H) 03/18/2023   CHOLHDL 5 03/18/2023

## 2023-03-27 NOTE — Assessment & Plan Note (Signed)
Since being off metformin, diarrhea has resolved.  Held on trying GLP 1 agonist, given concern regarding previous GI issues.  Did not tolerate jardiance.  a1c elevated.  On glipizide XL 2.5mg  q day.  A1c improved from last check - 7.3.  ok - 82 y/o. Follow sugars.  Low carb diet and exercise.

## 2023-03-27 NOTE — Assessment & Plan Note (Signed)
Currently on benazepril and hctz. Follow pressures.  Follow metabolic panel.

## 2023-03-27 NOTE — Assessment & Plan Note (Signed)
Right ear issues.  Cerumen impaction - right ear. Previously unable to clear.  Refer back to ENT for further treatment.

## 2023-03-27 NOTE — Assessment & Plan Note (Signed)
Recheck B12 level 

## 2023-03-27 NOTE — Assessment & Plan Note (Signed)
Continue prilosec. Follow.  

## 2023-03-27 NOTE — Assessment & Plan Note (Signed)
Intolerant to statin medication as outlined.  

## 2023-04-08 ENCOUNTER — Telehealth: Payer: Self-pay

## 2023-04-08 NOTE — Progress Notes (Signed)
   Care Guide Note  04/08/2023 Name: JAYSE HAMMOND MRN: [id number] DOB: [birth year]  Referred by: Dale Manson, MD Reason for referral : Care Coordination (Outreach to schedule with Pharm d )   Misty Jones is a 82 y.o. year old female who is a primary care patient of Dale [place], MD. KIMBERELY ARNET was referred to the pharmacist for assistance related to COPD.    Successful contact was made with the patient to discuss pharmacy services including being ready for the pharmacist to call at least 5 minutes before the scheduled appointment time, to have medication bottles and any blood sugar or blood pressure readings ready for review. The patient agreed to meet with the pharmacist via with the pharmacist via telephone visit on (date/time).  04/13/2023  Penne Lash, RMA Care Guide [facility]  [place], [place] [address] Direct Dial: [phone] [email or web address]

## 2023-04-12 ENCOUNTER — Encounter: Payer: Self-pay | Admitting: Internal Medicine

## 2023-04-13 ENCOUNTER — Other Ambulatory Visit (HOSPITAL_COMMUNITY): Payer: Self-pay

## 2023-04-13 ENCOUNTER — Other Ambulatory Visit: Payer: Medicare Other | Admitting: Pharmacist

## 2023-04-13 NOTE — Progress Notes (Signed)
04/13/2023 Name: Misty Jones MRN: [id number] DOB: 02-25-1941  No chief complaint on file.   Misty Jones is a 82 y.o. year old female who presented for a telephone visit.   They were referred to the pharmacist by their PCP for assistance in managing medication access related to Symbicort and Zetia.    Subjective:  Care Team: Primary Care Provider: Dale [place], MD ; Next Scheduled Visit: 07/07/23  Medication Access/Adherence  Current Pharmacy:  [facility] [address] [place] - [address] [place] [address] Phone: [phone] Fax: [phone]  [id number] - [place], [place] - [address] [place] [address] Phone: [phone] Fax: [phone]  [facility] [place] - [address] AT [facility] mail services [address] Mail Order pharmacy [place] [address] Phone: [phone] Fax: [phone]   Patient reports affordability concerns with their medications: Yes ; Symbicort (Copay >$100 per month).  Patient reports all other medications are currently affordable through [facility].   Lives in household of 1 and reports the following income: Social Security: $1333/mo Retirement: $617/mo Total estimated annual income: ~$22,800   Hyperlipidemia At last PCP visit, patient was started on ezetimibe 10 mg daily (patient took for a little over 1 week though reports similar muscle symptoms to those reported with statin medications and Repatha previously). Reports muscle pain is limiting in performing her ADLs and thus she discontinued ezetimibe.    Objective:  Lab Results  Component Value Date   HGBA1C 7.3 (H) 03/18/2023    Lab Results  Component Value Date   CREATININE 0.89 03/18/2023   BUN 12 03/18/2023   NA 143 03/18/2023   K 4.5 03/18/2023   CL 101 03/18/2023   CO2 35 (H) 03/18/2023    Lab Results  Component Value Date   CHOL 263 (H) 03/18/2023    HDL 50.90 03/18/2023   LDLCALC 181 (H) 03/18/2023   LDLDIRECT 150.0 12/31/2021   TRIG 158.0 (H) 03/18/2023   CHOLHDL 5 03/18/2023    Medications Reviewed Today     Reviewed by Loree Fee, [facility] (Pharmacist) on 04/13/23 at 1113  Med List Status: <None>   Medication Order Taking? Sig Documenting Provider Last Dose Status Informant  albuterol (VENTOLIN HFA) 108 (90 Base) MCG/ACT inhaler [id number]  Inhale 2 puffs into the lungs every 6 (six) hours as needed for wheezing or shortness of breath. Dale [place], MD  Active   aspirin 81 MG tablet [id number]  Take 81 mg by mouth daily. [provider]  Active Self           Med Note Feliz Beam, CATHERINE E   Thu Nov 09, 2019 11:02 AM)    benazepril (LOTENSIN) 40 MG tablet [id number]  Take 1 tablet (40 mg total) by mouth daily. Dale [place], MD  Active   budesonide-formoterol [facility]) 80-4.5 MCG/ACT inhaler [id number]  Inhale 2 puffs into the lungs in the morning and at bedtime. Raechel Chute, MD  Active   cetirizine (ZYRTEC) 10 MG tablet [id number]  Take 10 mg by mouth daily. [provider]  Active   Cholecalciferol (VITAMIN D-3) 125 MCG (5000 UT) TABS [id number] Yes Take 1 tablet by mouth daily. [provider]  Active   cyanocobalamin (VITAMIN B12) 1000 MCG/ML injection [id number]  Inject 1 mL (1,000 mcg total) into the muscle every 30 (thirty) days. Dale [place], MD  Active   ezetimibe (ZETIA) 10 MG tablet [id number]  Take  1 tablet (10 mg total) by mouth daily. Dale [place], MD  Active   fluticasone [facility]) 50 MCG/ACT nasal spray [id number]  Place 1 spray into both nostrils daily. Raechel Chute, MD  Active   glipiZIDE (GLUCOTROL XL) 2.5 MG 24 hr tablet [id number]  Take 1 tablet by mouth once daily with breakfast Dale [place], MD  Active   glucose blood (CONTOUR TEST) test strip [id number]  USE 1 STRIP TO CHECK GLUCOSE ONCE DAILY Dale [place], MD  Active   hydrochlorothiazide (HYDRODIURIL) 25 MG tablet  [id number]  Take 1/2 (one-half) tablet by mouth once daily Dale [place], MD  Active   Lancets MISC [id number]  Check sugars once a day Dx: E11.9 (Contour Next) Dale [place], MD  Active Self  Multiple Vitamins-Minerals (ICAPS AREDS 2 PO) [id number] Yes Take 1 tablet by mouth in the morning and at bedtime. [provider]  Active   niacin 500 MG tablet [id number]  Take 500 mg by mouth at bedtime. [provider]  Active   omeprazole (PRILOSEC) 20 MG capsule [id number]  Take 20 mg by mouth daily. [provider]  Active   Syringe/Needle, Disp, (SYRINGE 3CC/25GX1") 25G X 1" 3 ML MISC [id number]  Use as directed to administer b12 injections. Dale , MD  Active             Assessment/Plan:   #Medication Access: Symbicort inhaler is cost-prohibitive at this time as patient reports she does not have prescription insurance. Current cost through [facility]Rx is a little over $100 at [employer]. She may be eligible for Manufacturer copay card given her lack of prescription insurance which would ideally bring copay to around $35.   Provided copay card information to [facility] pharmacy, though they were having trouble getting the claim to go through.  Will loop in [facility] team to see if any luck with copay card  Future consideration: Alternative option include applying to manufacturer assistance program given her income. With Symbicort going generic, there is no longer MAP available, though the following inhalers do have MAPs.   AZ&Me: 300% FPL LAMA/LABA/ICS    Breztri (glycopyrrolate/formoterol/budesonide)  BI Cares: 200% FPL LAMA    Spiriva (tiotropium) LAMA/LABA    Stioloto (tiotropium/olodaterol) SAMA/SABA    Combivent (ipratropium/albuterol)   #Hyperlipidemia: Uncontrolled with LDL 181 mg/dL (1/66/06). Unfortunately, patient reports intolerance to ezetimibe 10 mg daily. She reports bilateral muscle pain in her legs. Stopped taking after ~1 week. Reports similar  adverse effect with all statins as well as Repatha previously. Future options are limited given reported intolerance to statins, PCSK9j and ezetimibe.  Could consider inclisiran (Leqvio) injection in the future given medication is rapidly sequestered in hepatocytes (thus unlikely to reach muscle tissue) though logistically could be more challenging depending on current clinic workflows (clinic-administered medication) Future consideration: Inclisiran: Billed through Medical benefit (Medicare B). Usually $0 copay with Medicare supplement.    Loree Fee, PharmD Clinical Pharmacist [facility] [phone]

## 2023-04-13 NOTE — Patient Instructions (Addendum)
Ms. BRALEY DIMURO,   It was a pleasure chatting with you today! As we discussed:?   I will attempt to contact your pharmacy to see if a coupon card will work to lower the cost of Symbicort.  Alternatively, we may by able to apply for a medication assistance program to receive a different inhaler at no cost.  I will relay your side effects with Zetia to Dr. Lorin Picket.   I will plan to reach out to you via MyChart message with any updates or questions.   Berenice Primas, PharmD - Clinical Pharmacist

## 2023-04-19 ENCOUNTER — Encounter: Payer: Self-pay | Admitting: Pharmacist

## 2023-04-19 DIAGNOSIS — R053 Chronic cough: Secondary | ICD-10-CM

## 2023-04-21 ENCOUNTER — Other Ambulatory Visit (HOSPITAL_COMMUNITY): Payer: Self-pay

## 2023-04-21 ENCOUNTER — Other Ambulatory Visit: Payer: Self-pay | Admitting: Pharmacist

## 2023-04-21 DIAGNOSIS — R053 Chronic cough: Secondary | ICD-10-CM

## 2023-04-21 DIAGNOSIS — H353124 Nonexudative age-related macular degeneration, left eye, advanced atrophic with subfoveal involvement: Secondary | ICD-10-CM | POA: Diagnosis not present

## 2023-04-21 MED ORDER — BUDESONIDE-FORMOTEROL FUMARATE 80-4.5 MCG/ACT IN AERO
2.0000 | INHALATION_SPRAY | Freq: Two times a day (BID) | RESPIRATORY_TRACT | 12 refills | Status: DC
  Filled 2023-04-21 – 2023-05-03 (×2): qty 10.2, 30d supply, fill #0
  Filled 2023-07-28 – 2023-07-29 (×3): qty 10.2, 30d supply, fill #1

## 2023-04-22 ENCOUNTER — Encounter: Payer: Self-pay | Admitting: Internal Medicine

## 2023-04-22 ENCOUNTER — Other Ambulatory Visit (HOSPITAL_COMMUNITY): Payer: Self-pay

## 2023-04-22 DIAGNOSIS — J300 Vasomotor rhinitis: Secondary | ICD-10-CM | POA: Diagnosis not present

## 2023-04-22 DIAGNOSIS — H903 Sensorineural hearing loss, bilateral: Secondary | ICD-10-CM | POA: Diagnosis not present

## 2023-04-22 DIAGNOSIS — H6123 Impacted cerumen, bilateral: Secondary | ICD-10-CM | POA: Diagnosis not present

## 2023-04-23 ENCOUNTER — Other Ambulatory Visit (HOSPITAL_COMMUNITY): Payer: Self-pay

## 2023-04-29 ENCOUNTER — Other Ambulatory Visit (HOSPITAL_COMMUNITY): Payer: Self-pay

## 2023-05-03 ENCOUNTER — Other Ambulatory Visit (HOSPITAL_COMMUNITY): Payer: Self-pay

## 2023-05-07 DIAGNOSIS — H353124 Nonexudative age-related macular degeneration, left eye, advanced atrophic with subfoveal involvement: Secondary | ICD-10-CM | POA: Diagnosis not present

## 2023-05-11 ENCOUNTER — Ambulatory Visit (INDEPENDENT_AMBULATORY_CARE_PROVIDER_SITE_OTHER): Payer: Medicare Other | Admitting: *Deleted

## 2023-05-11 ENCOUNTER — Encounter: Payer: Self-pay | Admitting: Internal Medicine

## 2023-05-11 ENCOUNTER — Telehealth: Payer: Self-pay | Admitting: *Deleted

## 2023-05-11 ENCOUNTER — Other Ambulatory Visit: Payer: Self-pay | Admitting: Internal Medicine

## 2023-05-11 VITALS — Ht 64.0 in | Wt 210.0 lb

## 2023-05-11 DIAGNOSIS — Z0000 Encounter for general adult medical examination without abnormal findings: Secondary | ICD-10-CM | POA: Diagnosis not present

## 2023-05-11 NOTE — Patient Instructions (Signed)
Misty Jones , Thank you for taking time to come for your Medicare Wellness Visit. I appreciate your ongoing commitment to your health goals. Please review the following plan we discussed and let me know if I can assist you in the future.   Referrals/Orders/Follow-Ups/Clinician Recommendations: Remember to get our Diabetic Eye Exam  This is a list of the screening recommended for you and due dates:  Health Maintenance  Topic Date Due   COVID-19 Vaccine (3 - Moderna risk series) 07/26/2020   Eye exam for diabetics  11/05/2022   Yearly kidney health urinalysis for diabetes  04/02/2023   Complete foot exam   05/08/2023   Mammogram  05/21/2023   Hemoglobin A1C  09/18/2023   Yearly kidney function blood test for diabetes  03/17/2024   Medicare Annual Wellness Visit  05/10/2024   Pneumonia Vaccine  Completed   Flu Shot  Completed   DEXA scan (bone density measurement)  Completed   Zoster (Shingles) Vaccine  Completed   HPV Vaccine  Aged Out   DTaP/Tdap/Td vaccine  Discontinued    Advanced directives: (In Chart) A copy of your advanced directives are scanned into your chart should your provider ever need it.  Next Medicare Annual Wellness Visit scheduled for next year: Yes 05/15/24 @ 12:45

## 2023-05-11 NOTE — Progress Notes (Signed)
Subjective:   Misty Jones is a 82 y.o. female who presents for Medicare Annual (Subsequent) preventive examination.  Visit Complete: Virtual I connected with  Misty Jones on 05/11/23 by a audio enabled telemedicine application and verified that I am speaking with the correct person using two identifiers.  Patient Location: Home  Provider Location: Office/Clinic  I discussed the limitations of evaluation and management by telemedicine. The patient expressed understanding and agreed to proceed.  Vital Signs: Because this visit was a virtual/telehealth visit, some criteria may be missing or patient reported. Any vitals not documented were not able to be obtained and vitals that have been documented are patient reported.   Cardiac Risk Factors include: advanced age (>72men, >47 women);diabetes mellitus;dyslipidemia;hypertension;obesity (BMI >30kg/m2)     Objective:    Today's Vitals   05/11/23 1251  Weight: 210 lb (95.3 kg)  Height: 5\' 4"  (1.626 m)   Body mass index is 36.05 kg/m.     05/11/2023    1:14 PM 05/07/2022   11:23 AM 01/21/2021    1:28 PM 01/15/2020    1:49 PM 12/30/2018   10:13 AM 01/31/2018    4:11 PM 12/13/2017    8:22 AM  Advanced Directives  Does Patient Have a Medical Advance Directive? Yes Yes Yes Yes Yes Yes Yes  Type of Estate agent of [place];Living will Healthcare Power of [place];Living will Healthcare Power of [place];Living will Living will Living will Healthcare Power of [place];Living will Healthcare Power of [place];Living will  Does patient want to make changes to medical advance directive? No - Patient declined No - Patient declined No - Patient declined No - Patient declined No - Patient declined No - Patient declined   Copy of Healthcare Power of Attorney in Chart? Yes - validated most recent copy scanned in chart (See row information) Yes - validated most recent copy scanned in chart (See row information) No - copy  requested    No - copy requested    Current Medications (verified) Outpatient Encounter Medications as of 05/11/2023  Medication Sig   aspirin 81 MG tablet Take 81 mg by mouth daily.   benazepril (LOTENSIN) 40 MG tablet Take 1 tablet (40 mg total) by mouth daily.   cetirizine (ZYRTEC) 10 MG tablet Take 10 mg by mouth daily.   Cholecalciferol (VITAMIN D-3) 125 MCG (5000 UT) TABS Take 1 tablet by mouth daily.   cyanocobalamin (VITAMIN B12) 1000 MCG/ML injection Inject 1 mL (1,000 mcg total) into the muscle every 30 (thirty) days.   glipiZIDE (GLUCOTROL XL) 2.5 MG 24 hr tablet Take 1 tablet by mouth once daily with breakfast   glucose blood (CONTOUR TEST) test strip USE 1 STRIP TO CHECK GLUCOSE ONCE DAILY   hydrochlorothiazide (HYDRODIURIL) 25 MG tablet Take 1/2 (one-half) tablet by mouth once daily   Lancets MISC Check sugars once a day Dx: E11.9 (Contour Next)   Multiple Vitamins-Minerals (ICAPS AREDS 2 PO) Take 1 tablet by mouth in the morning and at bedtime.   niacin 500 MG tablet Take 500 mg by mouth at bedtime.   omeprazole (PRILOSEC) 20 MG capsule Take 20 mg by mouth daily.   Syringe/Needle, Disp, (SYRINGE 3CC/25GX1") 25G X 1" 3 ML MISC Use as directed to administer b12 injections.   albuterol (VENTOLIN HFA) 108 (90 Base) MCG/ACT inhaler Inhale 2 puffs into the lungs every 6 (six) hours as needed for wheezing or shortness of breath. (Patient not taking: Reported on 05/11/2023)   budesonide-formoterol (SYMBICORT) 80-4.5  MCG/ACT inhaler Inhale 2 puffs into the lungs in the morning and at bedtime. (Patient not taking: Reported on 05/11/2023)   ezetimibe (ZETIA) 10 MG tablet Take 1 tablet (10 mg total) by mouth daily. (Patient not taking: Reported on 05/11/2023)   fluticasone (FLONASE) 50 MCG/ACT nasal spray Place 1 spray into both nostrils daily. (Patient not taking: Reported on 05/11/2023)   No facility-administered encounter medications on file as of 05/11/2023.    Allergies  (verified) Augmentin [amoxicillin-pot clavulanate] and Protonix [pantoprazole sodium]   History: Past Medical History:  Diagnosis Date   B12 deficiency    Chicken pox    Diabetes mellitus (HCC)    GERD (gastroesophageal reflux disease)    History of diverticulitis of colon    History of dysplastic nevus 07/05/2018   left mid back shv - with moderate atypia   Hypercholesterolemia    Hypertension    Impacted cerumen of left ear 07/01/2022   Nasal drainage    Non-recurrent acute suppurative otitis media of right ear without spontaneous rupture of tympanic membrane 06/16/2022   Past Surgical History:  Procedure Laterality Date   ABDOMINAL HYSTERECTOMY  1984   BACK SURGERY  1986   ruptured disc, Dr Vear Clock [facility])   CHOLECYSTECTOMY  1991   CHOLECYSTECTOMY  1992   COLONOSCOPY WITH PROPOFOL N/A 06/29/2016   Procedure: COLONOSCOPY WITH PROPOFOL;  Surgeon: Scot Jun, MD;  Location: [facility] ENDOSCOPY;  Service: Endoscopy;  Laterality: N/A;   ESOPHAGOGASTRODUODENOSCOPY (EGD) WITH PROPOFOL N/A 12/13/2017   Procedure: ESOPHAGOGASTRODUODENOSCOPY (EGD) WITH PROPOFOL;  Surgeon: Scot Jun, MD;  Location: [facility] ENDOSCOPY;  Service: Endoscopy;  Laterality: N/A;   TONSILLECTOMY     Family History  Problem Relation Age of Onset   Hypertension Mother    Cancer Mother        Mouth cancer   Heart disease Father        myocardial infarction   Raynaud syndrome Sister    Asthma Sister    Congenital heart disease Sister    Thyroid disease Sister    Thyroid cancer Daughter    Breast cancer Neg Hx    Colon cancer Neg Hx    Social History   Socioeconomic History   Marital status: Divorced    Spouse name: Not on file   Number of children: 2   Years of education: Not on file   Highest education level: Not on file  Occupational History   Occupation: retired  Tobacco Use   Smoking status: Former    Current packs/day: 0.00    Average packs/day: 0.5 packs/day for 20.0 years  (10.0 ttl pk-yrs)    Types: Cigarettes    Start date: 07/26/1964    Quit date: 07/26/1984    Years since quitting: 38.8   Smokeless tobacco: Never   Tobacco comments:    quit smoking years ago  Vaping Use   Vaping status: Never Used  Substance and Sexual Activity   Alcohol use: No    Alcohol/week: 0.0 standard drinks of alcohol   Drug use: No   Sexual activity: Never  Other Topics Concern   Not on file  Social History Narrative   divorced   Social Determinants of Health   Financial Resource Strain: Low Risk  (05/11/2023)   Overall Financial Resource Strain (CARDIA)    Difficulty of Paying Living Expenses: Not hard at all  Food Insecurity: No Food Insecurity (05/11/2023)   Hunger Vital Sign    Worried About Programme researcher, broadcasting/film/video in  the Last Year: Never true    Ran Out of Food in the Last Year: Never true  Transportation Needs: No Transportation Needs (05/11/2023)   PRAPARE - Administrator, Civil Service (Medical): No    Lack of Transportation (Non-Medical): No  Physical Activity: Inactive (05/11/2023)   Exercise Vital Sign    Days of Exercise per Week: 0 days    Minutes of Exercise per Session: 0 min  Stress: No Stress Concern Present (05/11/2023)   [employer] of Occupational Health - Occupational Stress Questionnaire    Feeling of Stress : Not at all  Social Connections: Moderately Isolated (05/11/2023)   Social Connection and Isolation Panel [NHANES]    Frequency of Communication with Friends and Family: More than three times a week    Frequency of Social Gatherings with Friends and Family: Once a week    Attends Religious Services: More than 4 times per year    Active Member of [employer] or Organizations: No    Attends Engineer, structural: Never    Marital Status: Divorced    Tobacco Counseling Counseling given: Not Answered Tobacco comments: quit smoking years ago   Clinical Intake:  Pre-visit preparation completed: Yes  Pain :  No/denies pain     BMI - recorded: 36.05 Nutritional Status: BMI > 30  Obese Nutritional Risks: None Diabetes: Yes CBG done?: No Did pt. bring in CBG monitor from home?: No  How often do you need to have someone help you when you read instructions, pamphlets, or other written materials from your doctor or pharmacy?: 1 - Never  Interpreter Needed?: No  Information entered by :: R. Sisto Granillo LPN   Activities of Daily Living    05/11/2023   12:56 PM  In your present state of health, do you have any difficulty performing the following activities:  Hearing? 1  Comment some difficulty  Vision? 1  Comment glasses  Difficulty concentrating or making decisions? 0  Walking or climbing stairs? 0  Dressing or bathing? 0  Doing errands, shopping? 1  Comment daughter helps  Preparing Food and eating ? N  Using the Toilet? N  In the past six months, have you accidently leaked urine? N  Do you have problems with loss of bowel control? N  Managing your Medications? N  Managing your Finances? N  Housekeeping or managing your Housekeeping? N    Patient Care Team: Dale Dryville, MD as PCP - General (Internal Medicine) Dale Erie, MD (Internal Medicine)  Indicate any recent Medical Services you may have received from other than [facility] providers in the past year (date may be approximate).     Assessment:   This is a routine wellness examination for Misty Jones.  Hearing/Vision screen Hearing Screening - Comments:: Some difficulty Vision Screening - Comments:: Some difficulty seeing with glasses   Goals Addressed             This Visit's Progress    Patient Stated       Wants to lose some weight       Depression Screen    05/11/2023    1:07 PM 03/22/2023   11:29 AM 08/11/2022   12:59 PM 05/13/2022   10:41 AM 05/07/2022    1:04 PM 05/07/2022   11:19 AM 04/02/2022    4:29 PM  PHQ 2/9 Scores  PHQ - 2 Score 0 1 4 0 0 1 0  PHQ- 9 Score 4  8        Fall  Risk    05/11/2023     1:01 PM 03/22/2023   11:29 AM 08/11/2022   12:59 PM 05/13/2022   10:41 AM 05/07/2022    1:04 PM  Fall Risk   Falls in the past year? 1 0 1 0 0  Number falls in past yr: 1 0 1 0   Injury with Fall? 0 0 0 0   Risk for fall due to : History of fall(s);Impaired balance/gait No Fall Risks History of fall(s) No Fall Risks No Fall Risks  Follow up Falls evaluation completed;Falls prevention discussed Falls evaluation completed Falls evaluation completed Falls evaluation completed Falls evaluation completed    MEDICARE RISK AT HOME: Medicare Risk at Home Any stairs in or around the home?: Yes If so, are there any without handrails?: Yes Home free of loose throw rugs in walkways, pet beds, electrical cords, etc?: Yes Adequate lighting in your home to reduce risk of falls?: Yes Life alert?: No Use of a cane, walker or w/c?: Yes (cane at times) Grab bars in the bathroom?: No Shower chair or bench in shower?: No Elevated toilet seat or a handicapped toilet?: No   Cognitive Function:    12/11/2016   10:42 AM 12/12/2015   10:59 AM  MMSE - Mini Mental State Exam  Orientation to time 5 5  Orientation to Place 5 5  Registration 3 3  Attention/ Calculation 5 5  Recall 3 3  Language- name 2 objects 2 2  Language- repeat 1 1  Language- follow 3 step command 3 3  Language- read & follow direction 1 1  Write a sentence 1 1  Copy design 1 1  Total score 30 30        05/11/2023    1:14 PM 05/07/2022   11:36 AM 01/15/2020    1:56 PM 12/30/2018   10:16 AM 12/24/2017   12:05 PM  6CIT Screen  What Year? 0 points 0 points 0 points 0 points 0 points  What month? 0 points 0 points 0 points 0 points 0 points  What time? 0 points 0 points  0 points 0 points  Count back from 20 0 points 0 points  0 points 0 points  Months in reverse 0 points 0 points 0 points 0 points 0 points  Repeat phrase 0 points 0 points 0 points 0 points 0 points  Total Score 0 points 0 points  0 points 0 points     Immunizations Immunization History  Administered Date(s) Administered   Fluad Quad(high Dose 65+) 05/31/2019, 05/30/2020, 04/30/2021, 05/07/2022   Fluad Trivalent(High Dose 65+) 03/22/2023   Influenza Split 05/31/2012, 04/16/2014   Influenza, High Dose Seasonal PF 03/25/2016, 04/23/2017, 05/09/2018   Influenza-Unspecified 04/26/2012, 05/31/2012, 05/03/2013, 04/16/2014, 05/08/2015, 03/25/2016   Moderna SARS-COV2 Booster Vaccination 06/28/2020   Moderna Sars-Covid-2 Vaccination 09/23/2019, 10/21/2019   Pneumococcal Conjugate-13 05/30/2013   Pneumococcal Polysaccharide-23 12/12/2015   Zoster, Live 06/28/2012    TDAP status: Up to date  Flu Vaccine status: Up to date  Pneumococcal vaccine status: Up to date  Covid-19 vaccine status: Information provided on how to obtain vaccines.   Qualifies for Shingles Vaccine? Yes   Zostavax completed Yes   Shingrix Completed?: No.    Education has been provided regarding the importance of this vaccine. Patient has been advised to call insurance company to determine out of pocket expense if they have not yet received this vaccine. Advised may also receive vaccine at local pharmacy or Health Dept. Verbalized acceptance and understanding.  Vaccines were received per patient, received confirmation from pharmacy  Screening Tests Health Maintenance  Topic Date Due   Zoster Vaccines- Shingrix (1 of 2) 05/02/1960   COVID-19 Vaccine (3 - Moderna risk series) 07/26/2020   OPHTHALMOLOGY EXAM  11/05/2022   Diabetic kidney evaluation - Urine ACR  04/02/2023   Medicare Annual Wellness (AWV)  05/08/2023   FOOT EXAM  05/08/2023   MAMMOGRAM  05/21/2023   HEMOGLOBIN A1C  09/18/2023   Diabetic kidney evaluation - eGFR measurement  03/17/2024   Pneumonia Vaccine 39+ Years old  Completed   INFLUENZA VACCINE  Completed   DEXA SCAN  Completed   HPV VACCINES  Aged Out   DTaP/Tdap/Td  Discontinued    Health Maintenance  Health Maintenance Due  Topic  Date Due   Zoster Vaccines- Shingrix (1 of 2) 05/02/1960   COVID-19 Vaccine (3 - Moderna risk series) 07/26/2020   OPHTHALMOLOGY EXAM  11/05/2022   Diabetic kidney evaluation - Urine ACR  04/02/2023   Medicare Annual Wellness (AWV)  05/08/2023   FOOT EXAM  05/08/2023    Colorectal cancer screening: No longer required.   Mammogram status: Completed 04/2022. Repeat every year  Bone Density status: Completed 12/2010. Results reflect: Bone density results: OSTEOPENIA. Repeat every 2 years. Wants to discuss with PCP first  Lung Cancer Screening: (Low Dose CT Chest recommended if Age 86-80 years, 20 pack-year currently smoking OR have quit w/in 15years.) does not qualify.     Additional Screening:  Hepatitis C Screening: does not qualify; Completed NA age  Vision Screening: Recommended annual ophthalmology exams for early detection of glaucoma and other disorders of the eye. Is the patient up to date with their annual eye exam?  Yes  Who is the provider or what is the name of the office in which the patient attends annual eye exams? [facility] If pt is not established with a provider, would they like to be referred to a provider to establish care? No .   Dental Screening: Recommended annual dental exams for proper oral hygiene  Diabetic Foot Exam: Diabetic Foot Exam: Overdue, Pt has been advised about the importance in completing this exam. Pt is scheduled for diabetic foot exam on Will need at next visit and documented..  Community Resource Referral / Chronic Care Management: CRR required this visit?  No   CCM required this visit?  No     Plan:     I have personally reviewed and noted the following in the patient's chart:   Medical and social history Use of alcohol, tobacco or illicit drugs  Current medications and supplements including opioid prescriptions. Patient is not currently taking opioid prescriptions. Functional ability and status Nutritional status Physical  activity Advanced directives List of other physicians Hospitalizations, surgeries, and ER visits in previous 12 months Vitals Screenings to include cognitive, depression, and falls Referrals and appointments  In addition, I have reviewed and discussed with patient certain preventive protocols, quality metrics, and best practice recommendations. A written personalized care plan for preventive services as well as general preventive health recommendations were provided to patient.     Sydell Axon, LPN   [id number]   After Visit Summary: (MyChart) Due to this being a telephonic visit, the after visit summary with patients personalized plan was offered to patient via MyChart   Nurse Notes: Phone note sent to PCP

## 2023-05-11 NOTE — Telephone Encounter (Signed)
Continue to hold zetia.  See if symptoms resolve. Would like for her to hold on further medication for now until her symptoms clear - before starting something new.

## 2023-05-11 NOTE — Telephone Encounter (Signed)
Called patient to perform her AWV.  Patient stated that she stopped taking Ezetimibe about a week after starting it because it was causing muscle pain.  Patient stated that she has problems taking medications for cholesterol.

## 2023-05-12 NOTE — Telephone Encounter (Signed)
Patient aware. Will remain off.

## 2023-05-18 ENCOUNTER — Encounter: Payer: Self-pay | Admitting: Internal Medicine

## 2023-05-18 NOTE — Telephone Encounter (Signed)
Please call - I am confused regarding this message. Can schedule appt to discuss blood pressure

## 2023-05-19 NOTE — Telephone Encounter (Signed)
Per review of medication, she is currently taking benazepril and hydrochlorothiazide.  Please confirm this is correct.  If correct, she is to not take the telmisartan  (do not take this with benazepril).  Given that blood pressure is remaining elevated and if taking the benazepril and hydrochlorothiazide, then recommend starting amlodipine 5mg  q day.  Needs earlier appt than December to follow up regarding blood pressure.

## 2023-05-19 NOTE — Telephone Encounter (Signed)
Called pt and offer her an appointment and she said that she has an appointment in Dec and can't find a ride until then. She wanted to let you know that he Bp was running high. She also wanted to know if you want her to taking Telmisartin and Amlodipine. She stated another doctor gave them to her but she did not want to take them until she heard back from you.

## 2023-05-20 NOTE — Telephone Encounter (Signed)
FYI for you  Called patient. Confirmed doing ok. She is currently taking benazepril and hydrochlorothiazide daily. She is not taking telmisartan and has not started the amlodipine 5 mg yet but has this at home. Instructed patient DO NOT take the micardis. Start amlodipine 5 mg q day and spot check pressures daily. Advised how to properly check bp. She is going to monitor and send readings. Scheduled for a f/u in 3 weeks. (Pt did not want to come in sooner because she does not drive anymore and has to arrange transportation)

## 2023-06-01 ENCOUNTER — Encounter: Payer: Self-pay | Admitting: Internal Medicine

## 2023-06-01 NOTE — Telephone Encounter (Signed)
Since just started amlodipine last week and starting to trend down, continue amlodipine and spot check pressures. Keep f/u appt

## 2023-06-11 ENCOUNTER — Encounter: Payer: Self-pay | Admitting: Internal Medicine

## 2023-06-11 ENCOUNTER — Ambulatory Visit (INDEPENDENT_AMBULATORY_CARE_PROVIDER_SITE_OTHER): Payer: Medicare Other | Admitting: Internal Medicine

## 2023-06-11 VITALS — BP 118/70 | HR 88 | Temp 98.0°F | Resp 16 | Ht 64.0 in | Wt 217.6 lb

## 2023-06-11 DIAGNOSIS — E7800 Pure hypercholesterolemia, unspecified: Secondary | ICD-10-CM

## 2023-06-11 DIAGNOSIS — I700 Atherosclerosis of aorta: Secondary | ICD-10-CM | POA: Diagnosis not present

## 2023-06-11 DIAGNOSIS — Z860100 Personal history of colon polyps, unspecified: Secondary | ICD-10-CM

## 2023-06-11 DIAGNOSIS — Z7984 Long term (current) use of oral hypoglycemic drugs: Secondary | ICD-10-CM | POA: Diagnosis not present

## 2023-06-11 DIAGNOSIS — G720 Drug-induced myopathy: Secondary | ICD-10-CM

## 2023-06-11 DIAGNOSIS — K219 Gastro-esophageal reflux disease without esophagitis: Secondary | ICD-10-CM | POA: Diagnosis not present

## 2023-06-11 DIAGNOSIS — I10 Essential (primary) hypertension: Secondary | ICD-10-CM | POA: Diagnosis not present

## 2023-06-11 DIAGNOSIS — R062 Wheezing: Secondary | ICD-10-CM

## 2023-06-11 DIAGNOSIS — E1165 Type 2 diabetes mellitus with hyperglycemia: Secondary | ICD-10-CM

## 2023-06-11 DIAGNOSIS — T466X5A Adverse effect of antihyperlipidemic and antiarteriosclerotic drugs, initial encounter: Secondary | ICD-10-CM | POA: Diagnosis not present

## 2023-06-11 NOTE — Progress Notes (Unsigned)
Subjective:    Patient ID: Misty Jones, female    DOB: 09/14/1940, 82 y.o.   MRN: [id number]  Patient here for  Chief Complaint  Patient presents with  . Medical Management of Chronic Issues    HPI Here for a scheduled follow up.  Here to follow up regarding her blood pressure. On amlodipine 5mg  q day.    Past Medical History:  Diagnosis Date  . B12 deficiency   . Chicken pox   . Diabetes mellitus (HCC)   . GERD (gastroesophageal reflux disease)   . History of diverticulitis of colon   . History of dysplastic nevus 07/05/2018   left mid back shv - with moderate atypia  . Hypercholesterolemia   . Hypertension   . Impacted cerumen of left ear 07/01/2022  . Nasal drainage   . Non-recurrent acute suppurative otitis media of right ear without spontaneous rupture of tympanic membrane 06/16/2022   Past Surgical History:  Procedure Laterality Date  . ABDOMINAL HYSTERECTOMY  1984  . BACK SURGERY  1986   ruptured disc, Dr Vear Clock [facility])  . CHOLECYSTECTOMY  1991  . CHOLECYSTECTOMY  1992  . COLONOSCOPY WITH PROPOFOL N/A 06/29/2016   Procedure: COLONOSCOPY WITH PROPOFOL;  Surgeon: Scot Jun, MD;  Location: [facility] ENDOSCOPY;  Service: Endoscopy;  Laterality: N/A;  . ESOPHAGOGASTRODUODENOSCOPY (EGD) WITH PROPOFOL N/A 12/13/2017   Procedure: ESOPHAGOGASTRODUODENOSCOPY (EGD) WITH PROPOFOL;  Surgeon: Scot Jun, MD;  Location: [facility] ENDOSCOPY;  Service: Endoscopy;  Laterality: N/A;  . TONSILLECTOMY     Family History  Problem Relation Age of Onset  . Hypertension Mother   . Cancer Mother        Mouth cancer  . Heart disease Father        myocardial infarction  . Raynaud syndrome Sister   . Asthma Sister   . Congenital heart disease Sister   . Thyroid disease Sister   . Thyroid cancer Daughter   . Breast cancer Neg Hx   . Colon cancer Neg Hx    Social History   Socioeconomic History  . Marital status: Divorced    Spouse name: Not on file  . Number of  children: 2  . Years of education: Not on file  . Highest education level: Not on file  Occupational History  . Occupation: retired  Tobacco Use  . Smoking status: Former    Current packs/day: 0.00    Average packs/day: 0.5 packs/day for 20.0 years (10.0 ttl pk-yrs)    Types: Cigarettes    Start date: 07/26/1964    Quit date: 07/26/1984    Years since quitting: 38.9  . Smokeless tobacco: Never  . Tobacco comments:    quit smoking years ago  Vaping Use  . Vaping status: Never Used  Substance and Sexual Activity  . Alcohol use: No    Alcohol/week: 0.0 standard drinks of alcohol  . Drug use: No  . Sexual activity: Never  Other Topics Concern  . Not on file  Social History Narrative   divorced   Social Determinants of Health   Financial Resource Strain: Low Risk  (05/11/2023)   Overall Financial Resource Strain (CARDIA)   . Difficulty of Paying Living Expenses: Not hard at all  Food Insecurity: No Food Insecurity (05/11/2023)   Hunger Vital Sign   . Worried About Programme researcher, broadcasting/film/video in the Last Year: Never true   . Ran Out of Food in the Last Year: Never true  Transportation Needs: No Transportation  Needs (05/11/2023)   PRAPARE - Transportation   . Lack of Transportation (Medical): No   . Lack of Transportation (Non-Medical): No  Physical Activity: Inactive (05/11/2023)   Exercise Vital Sign   . Days of Exercise per Week: 0 days   . Minutes of Exercise per Session: 0 min  Stress: No Stress Concern Present (05/11/2023)   [employer] of Occupational Health - Occupational Stress Questionnaire   . Feeling of Stress : Not at all  Social Connections: Moderately Isolated (05/11/2023)   Social Connection and Isolation Panel [NHANES]   . Frequency of Communication with Friends and Family: More than three times a week   . Frequency of Social Gatherings with Friends and Family: Once a week   . Attends Religious Services: More than 4 times per year   . Active Member of  Clubs or Organizations: No   . Attends Banker Meetings: Never   . Marital Status: Divorced     Review of Systems     Objective:     BP 118/70   Pulse 88   Temp 98 F (36.7 C)   Resp 16   Ht 5\' 4"  (1.626 m)   Wt 217 lb 9.6 oz (98.7 kg)   SpO2 98%   BMI 37.35 kg/m  Wt Readings from Last 3 Encounters:  06/11/23 217 lb 9.6 oz (98.7 kg)  05/11/23 210 lb (95.3 kg)  03/22/23 215 lb (97.5 kg)    Physical Exam   Outpatient Encounter Medications as of 06/11/2023  Medication Sig  . budesonide-formoterol (SYMBICORT) 80-4.5 MCG/ACT inhaler Inhale 2 puffs into the lungs in the morning and at bedtime.  Marland Kitchen albuterol (VENTOLIN HFA) 108 (90 Base) MCG/ACT inhaler Inhale 2 puffs into the lungs every 6 (six) hours as needed for wheezing or shortness of breath. (Patient not taking: Reported on 05/11/2023)  . aspirin 81 MG tablet Take 81 mg by mouth daily.  . benazepril (LOTENSIN) 40 MG tablet Take 1 tablet (40 mg total) by mouth daily.  . cetirizine (ZYRTEC) 10 MG tablet Take 10 mg by mouth daily.  . Cholecalciferol (VITAMIN D-3) 125 MCG (5000 UT) TABS Take 1 tablet by mouth daily.  . cyanocobalamin (VITAMIN B12) 1000 MCG/ML injection Inject 1 mL (1,000 mcg total) into the muscle every 30 (thirty) days.  Marland Kitchen ezetimibe (ZETIA) 10 MG tablet Take 1 tablet (10 mg total) by mouth daily. (Patient not taking: Reported on 05/11/2023)  . fluticasone (FLONASE) 50 MCG/ACT nasal spray Place 1 spray into both nostrils daily. (Patient not taking: Reported on 05/11/2023)  . glipiZIDE (GLUCOTROL XL) 2.5 MG 24 hr tablet Take 1 tablet by mouth once daily with breakfast  . glucose blood (CONTOUR TEST) test strip USE 1 STRIP TO CHECK GLUCOSE ONCE DAILY  . hydrochlorothiazide (HYDRODIURIL) 25 MG tablet Take 1/2 (one-half) tablet by mouth once daily  . Lancets MISC Check sugars once a day Dx: E11.9 (Contour Next)  . Multiple Vitamins-Minerals (ICAPS AREDS 2 PO) Take 1 tablet by mouth in the morning and  at bedtime.  . niacin 500 MG tablet Take 500 mg by mouth at bedtime.  Marland Kitchen omeprazole (PRILOSEC) 20 MG capsule Take 20 mg by mouth daily.  . Syringe/Needle, Disp, (SYRINGE 3CC/25GX1") 25G X 1" 3 ML MISC Use as directed to administer b12 injections.   No facility-administered encounter medications on file as of 06/11/2023.     Lab Results  Component Value Date   WBC 7.9 11/09/2022   HGB 12.6 11/09/2022   HCT  39.1 11/09/2022   PLT 322.0 11/09/2022   GLUCOSE 111 (H) 03/18/2023   CHOL 263 (H) 03/18/2023   TRIG 158.0 (H) 03/18/2023   HDL 50.90 03/18/2023   LDLDIRECT 150.0 12/31/2021   LDLCALC 181 (H) 03/18/2023   ALT 17 03/18/2023   AST 19 03/18/2023   NA 143 03/18/2023   K 4.5 03/18/2023   CL 101 03/18/2023   CREATININE 0.89 03/18/2023   BUN 12 03/18/2023   CO2 35 (H) 03/18/2023   TSH 3.34 05/04/2022   INR 0.9 04/03/2012   HGBA1C 7.3 (H) 03/18/2023   MICROALBUR <0.7 04/01/2022    DG ESOPHAGUS W DOUBLE CM (HD)  Result Date: 09/29/2022 CLINICAL DATA:  Patient history of gastroesophageal reflux with chronic cough. Request is for double esophagram for further evaluation EXAM: ESOPHAGUS/BARIUM SWALLOW/TABLET STUDY TECHNIQUE: Combined double and single contrast examination was performed using effervescent crystals, high-density barium, and thin liquid barium. This exam was performed by Anders Grant NP and was supervised and interpreted by Caprice Renshaw, MD FLUOROSCOPY: Radiation Exposure Index (as provided by the fluoroscopic device): 20.50 mGy Kerma COMPARISON:  None Available. FINDINGS: Swallowing: No vestibular penetration or aspiration seen. Pharynx: Unremarkable. Esophagus: No obvious mucosal abnormality.  No visible ulceration. Esophageal motility: Mild esophageal dysmotility. Hiatal Hernia: Tiny sliding-type hiatal hernia. Gastroesophageal reflux: Spontaneous moderate gastroesophageal reflux Ingested 13mm barium tablet: Passed normally Other: None. IMPRESSION: Spontaneous moderate  gastroesophageal reflux. Tiny sliding type hiatal hernia. Mild esophageal dysmotility.  No evidence of stricture. Electronically Signed   By: Caprice Renshaw M.D.   On: 09/29/2022 13:17       Assessment & Plan:  There are no diagnoses linked to this encounter.   Dale Murray, MD

## 2023-06-12 ENCOUNTER — Encounter: Payer: Self-pay | Admitting: Internal Medicine

## 2023-06-12 DIAGNOSIS — R062 Wheezing: Secondary | ICD-10-CM | POA: Insufficient documentation

## 2023-06-12 NOTE — Assessment & Plan Note (Signed)
Continue prilosec. Follow.  

## 2023-06-12 NOTE — Assessment & Plan Note (Signed)
Currently on benazepril and hydrochlorothiazide and amlodipine.  Blood pressure - checks here - wnl. Continue current medication regimen. Follow pressures.  Follow metabolic panel.

## 2023-06-12 NOTE — Assessment & Plan Note (Signed)
Colonoscopy 12.2017 - one polyp in ascending colon and two rectal polyps (tubular adenomas).  recommended f/u in 5 years.  - need to confirm if due f/u.

## 2023-06-12 NOTE — Assessment & Plan Note (Addendum)
Intolerant to statins.  Off repatha.  Low cholesterol diet and exercise.  Follow lipid panel and liver function tests.  Lab Results  Component Value Date   CHOL 263 (H) 03/18/2023   HDL 50.90 03/18/2023   LDLCALC 181 (H) 03/18/2023   LDLDIRECT 150.0 12/31/2021   TRIG 158.0 (H) 03/18/2023   CHOLHDL 5 03/18/2023

## 2023-06-12 NOTE — Assessment & Plan Note (Signed)
Improved on symbicort.  Follow.

## 2023-06-12 NOTE — Assessment & Plan Note (Addendum)
Intolerant to statin medication.

## 2023-06-12 NOTE — Assessment & Plan Note (Signed)
Since being off metformin, diarrhea has resolved.  Held on trying GLP 1 agonist, given concern regarding previous GI issues.  Did not tolerate jardiance.  a1c elevated.  On glipizide XL 2.5mg  q day.  A1c improved from last check - 7.3.  ok - 82 y/o. Follow sugars.  Low carb diet and exercise.

## 2023-06-12 NOTE — Assessment & Plan Note (Signed)
Intolerant to statins.  Off repatha.  Low cholesterol diet and exercise.  Follow lipid panel and liver function tests.  Lab Results  Component Value Date   CHOL 263 (H) 03/18/2023   HDL 50.90 03/18/2023   LDLCALC 181 (H) 03/18/2023   LDLDIRECT 150.0 12/31/2021   TRIG 158.0 (H) 03/18/2023   CHOLHDL 5 03/18/2023

## 2023-06-30 ENCOUNTER — Ambulatory Visit
Admission: RE | Admit: 2023-06-30 | Discharge: 2023-06-30 | Disposition: A | Discharge status: Home / Self Care | Payer: Medicare Other | Source: Ambulatory Visit | Attending: Internal Medicine | Admitting: Internal Medicine

## 2023-06-30 ENCOUNTER — Other Ambulatory Visit (INDEPENDENT_AMBULATORY_CARE_PROVIDER_SITE_OTHER): Payer: Medicare Other

## 2023-06-30 DIAGNOSIS — Z1231 Encounter for screening mammogram for malignant neoplasm of breast: Secondary | ICD-10-CM | POA: Insufficient documentation

## 2023-06-30 DIAGNOSIS — I10 Essential (primary) hypertension: Secondary | ICD-10-CM

## 2023-06-30 DIAGNOSIS — E7800 Pure hypercholesterolemia, unspecified: Secondary | ICD-10-CM | POA: Diagnosis not present

## 2023-06-30 DIAGNOSIS — E1165 Type 2 diabetes mellitus with hyperglycemia: Secondary | ICD-10-CM

## 2023-06-30 LAB — HEPATIC FUNCTION PANEL
ALT: 29 U/L (ref 0–35)
AST: 18 U/L (ref 0–37)
Albumin: 3.9 g/dL (ref 3.5–5.2)
Alkaline Phosphatase: 70 U/L (ref 39–117)
Bilirubin, Direct: 0.1 mg/dL (ref 0.0–0.3)
Total Bilirubin: 0.5 mg/dL (ref 0.2–1.2)
Total Protein: 6.5 g/dL (ref 6.0–8.3)

## 2023-06-30 LAB — BASIC METABOLIC PANEL
BUN: 23 mg/dL (ref 6–23)
CO2: 34 meq/L — ABNORMAL HIGH (ref 19–32)
Calcium: 9.1 mg/dL (ref 8.4–10.5)
Chloride: 101 meq/L (ref 96–112)
Creatinine, Ser: 0.99 mg/dL (ref 0.40–1.20)
GFR: 52.86 mL/min — ABNORMAL LOW (ref >60.00)
Glucose, Bld: 141 mg/dL — ABNORMAL HIGH (ref 70–99)
Potassium: 4.5 meq/L (ref 3.5–5.1)
Sodium: 140 meq/L (ref 135–145)

## 2023-06-30 LAB — MICROALBUMIN / CREATININE URINE RATIO
Creatinine,U: 122.3 mg/dL
Microalb Creat Ratio: 1 mg/g (ref 0.0–30.0)
Microalb, Ur: 1.3 mg/dL (ref 0.0–1.9)

## 2023-06-30 LAB — LIPID PANEL
Cholesterol: 252 mg/dL — ABNORMAL HIGH (ref 0–200)
HDL: 48 mg/dL (ref >39.00)
LDL Cholesterol: 173 mg/dL — ABNORMAL HIGH (ref 0–99)
NonHDL: 203.78
Total CHOL/HDL Ratio: 5
Triglycerides: 156 mg/dL — ABNORMAL HIGH (ref 0.0–149.0)
VLDL: 31.2 mg/dL (ref 0.0–40.0)

## 2023-06-30 LAB — HEMOGLOBIN A1C: Hgb A1c MFr Bld: 7.8 % — ABNORMAL HIGH (ref 4.6–6.5)

## 2023-07-07 ENCOUNTER — Ambulatory Visit (INDEPENDENT_AMBULATORY_CARE_PROVIDER_SITE_OTHER): Payer: Medicare Other | Admitting: Internal Medicine

## 2023-07-07 ENCOUNTER — Encounter: Payer: Self-pay | Admitting: Internal Medicine

## 2023-07-07 VITALS — BP 126/70 | HR 93 | Temp 97.8°F | Ht 64.0 in | Wt 215.0 lb

## 2023-07-07 DIAGNOSIS — E7800 Pure hypercholesterolemia, unspecified: Secondary | ICD-10-CM | POA: Diagnosis not present

## 2023-07-07 DIAGNOSIS — Z1231 Encounter for screening mammogram for malignant neoplasm of breast: Secondary | ICD-10-CM

## 2023-07-07 DIAGNOSIS — E1165 Type 2 diabetes mellitus with hyperglycemia: Secondary | ICD-10-CM

## 2023-07-07 DIAGNOSIS — Z0000 Encounter for general adult medical examination without abnormal findings: Secondary | ICD-10-CM | POA: Diagnosis not present

## 2023-07-07 DIAGNOSIS — Z860100 Personal history of colon polyps, unspecified: Secondary | ICD-10-CM | POA: Diagnosis not present

## 2023-07-07 DIAGNOSIS — T466X5A Adverse effect of antihyperlipidemic and antiarteriosclerotic drugs, initial encounter: Secondary | ICD-10-CM | POA: Diagnosis not present

## 2023-07-07 DIAGNOSIS — Z7984 Long term (current) use of oral hypoglycemic drugs: Secondary | ICD-10-CM | POA: Diagnosis not present

## 2023-07-07 DIAGNOSIS — M25562 Pain in left knee: Secondary | ICD-10-CM

## 2023-07-07 DIAGNOSIS — M25561 Pain in right knee: Secondary | ICD-10-CM | POA: Diagnosis not present

## 2023-07-07 DIAGNOSIS — K219 Gastro-esophageal reflux disease without esophagitis: Secondary | ICD-10-CM | POA: Diagnosis not present

## 2023-07-07 DIAGNOSIS — I700 Atherosclerosis of aorta: Secondary | ICD-10-CM

## 2023-07-07 DIAGNOSIS — Z1211 Encounter for screening for malignant neoplasm of colon: Secondary | ICD-10-CM

## 2023-07-07 DIAGNOSIS — G720 Drug-induced myopathy: Secondary | ICD-10-CM | POA: Diagnosis not present

## 2023-07-07 DIAGNOSIS — I10 Essential (primary) hypertension: Secondary | ICD-10-CM | POA: Diagnosis not present

## 2023-07-07 MED ORDER — AMLODIPINE BESYLATE 5 MG PO TABS
5.0000 mg | ORAL_TABLET | Freq: Every day | ORAL | 1 refills | Status: DC
Start: 2023-07-07 — End: 2023-12-24

## 2023-07-07 NOTE — Progress Notes (Signed)
Subjective:    Patient ID: Misty Jones, female    DOB: 1940-09-19, 82 y.o.   MRN: [id number]  Patient here for  Chief Complaint  Patient presents with   Annual Exam   Knee Pain    HPI With past history of hypercholesterolemia, hypertension and diabetes, she comes in today to follow up on these issues as well as for a complete physical exam. Saw cardiology 12/02/22 - added amlodipine.  Continues on benazepril and hydrochlorothiazide. Continue aspirin and niacin. Sees Dr Aundria Rud. On symbicort. Breathing overall stable. Is having persistent knee pain - right > left. Discussed brace. Given persistent pain and limitations, discussed referral to ortho. Also discussed due colonoscopy.    Past Medical History:  Diagnosis Date   B12 deficiency    Chicken pox    Diabetes mellitus (HCC)    GERD (gastroesophageal reflux disease)    History of diverticulitis of colon    History of dysplastic nevus 07/05/2018   left mid back shv - with moderate atypia   Hypercholesterolemia    Hypertension    Impacted cerumen of left ear 07/01/2022   Nasal drainage    Non-recurrent acute suppurative otitis media of right ear without spontaneous rupture of tympanic membrane 06/16/2022   Past Surgical History:  Procedure Laterality Date   ABDOMINAL HYSTERECTOMY  1984   BACK SURGERY  1986   ruptured disc, Dr Vear Clock [facility])   CHOLECYSTECTOMY  1991   CHOLECYSTECTOMY  1992   COLONOSCOPY WITH PROPOFOL N/A 06/29/2016   Procedure: COLONOSCOPY WITH PROPOFOL;  Surgeon: Scot Jun, MD;  Location: [facility] ENDOSCOPY;  Service: Endoscopy;  Laterality: N/A;   ESOPHAGOGASTRODUODENOSCOPY (EGD) WITH PROPOFOL N/A 12/13/2017   Procedure: ESOPHAGOGASTRODUODENOSCOPY (EGD) WITH PROPOFOL;  Surgeon: Scot Jun, MD;  Location: [facility] ENDOSCOPY;  Service: Endoscopy;  Laterality: N/A;   TONSILLECTOMY     Family History  Problem Relation Age of Onset   Hypertension Mother    Cancer Mother        Mouth cancer    Heart disease Father        myocardial infarction   Raynaud syndrome Sister    Asthma Sister    Congenital heart disease Sister    Thyroid disease Sister    Thyroid cancer Daughter    Breast cancer Neg Hx    Colon cancer Neg Hx    Social History   Socioeconomic History   Marital status: Divorced    Spouse name: Not on file   Number of children: 2   Years of education: Not on file   Highest education level: Not on file  Occupational History   Occupation: retired  Tobacco Use   Smoking status: Former    Current packs/day: 0.00    Average packs/day: 0.5 packs/day for 20.0 years (10.0 ttl pk-yrs)    Types: Cigarettes    Start date: 07/26/1964    Quit date: 07/26/1984    Years since quitting: 38.9   Smokeless tobacco: Never   Tobacco comments:    quit smoking years ago  Vaping Use   Vaping status: Never Used  Substance and Sexual Activity   Alcohol use: No    Alcohol/week: 0.0 standard drinks of alcohol   Drug use: No   Sexual activity: Never  Other Topics Concern   Not on file  Social History Narrative   divorced   Social Drivers of Health   Financial Resource Strain: Low Risk  (05/11/2023)   Overall Financial Resource Strain (CARDIA)  Difficulty of Paying Living Expenses: Not hard at all  Food Insecurity: No Food Insecurity (05/11/2023)   Hunger Vital Sign    Worried About Running Out of Food in the Last Year: Never true    Ran Out of Food in the Last Year: Never true  Transportation Needs: No Transportation Needs (05/11/2023)   PRAPARE - Administrator, Civil Service (Medical): No    Lack of Transportation (Non-Medical): No  Physical Activity: Inactive (05/11/2023)   Exercise Vital Sign    Days of Exercise per Week: 0 days    Minutes of Exercise per Session: 0 min  Stress: No Stress Concern Present (05/11/2023)   [employer] of Occupational Health - Occupational Stress Questionnaire    Feeling of Stress : Not at all  Social Connections:  Moderately Isolated (05/11/2023)   Social Connection and Isolation Panel [NHANES]    Frequency of Communication with Friends and Family: More than three times a week    Frequency of Social Gatherings with Friends and Family: Once a week    Attends Religious Services: More than 4 times per year    Active Member of [employer] or Organizations: No    Attends Banker Meetings: Never    Marital Status: Divorced     Review of Systems  Constitutional:  Negative for appetite change and unexpected weight change.  HENT:  Negative for congestion, sinus pressure and sore throat.   Eyes:  Negative for pain and visual disturbance.  Respiratory:  Negative for cough, chest tightness and shortness of breath.   Cardiovascular:  Negative for chest pain, palpitations and leg swelling.  Gastrointestinal:  Negative for abdominal pain, diarrhea, nausea and vomiting.  Genitourinary:  Negative for difficulty urinating and dysuria.  Musculoskeletal:  Negative for myalgias.       Knee pain as outlined.   Skin:  Negative for color change and rash.  Neurological:  Negative for dizziness and headaches.  Hematological:  Negative for adenopathy. Does not bruise/bleed easily.  Psychiatric/Behavioral:  Negative for agitation and dysphoric mood.        Objective:     BP 126/70   Pulse 93   Temp 97.8 F (36.6 C)   Ht 5\' 4"  (1.626 m)   Wt 215 lb (97.5 kg)   SpO2 95%   BMI 36.90 kg/m  Wt Readings from Last 3 Encounters:  07/07/23 215 lb (97.5 kg)  06/11/23 217 lb 9.6 oz (98.7 kg)  05/11/23 210 lb (95.3 kg)    Physical Exam Vitals reviewed.  Constitutional:      General: She is not in acute distress.    Appearance: Normal appearance. She is well-developed.  HENT:     Head: Normocephalic and atraumatic.     Right Ear: External ear normal.     Left Ear: External ear normal.     Mouth/Throat:     Pharynx: No oropharyngeal exudate or posterior oropharyngeal erythema.  Eyes:     General: No  scleral icterus.       Right eye: No discharge.        Left eye: No discharge.     Conjunctiva/sclera: Conjunctivae normal.  Neck:     Thyroid: No thyromegaly.  Cardiovascular:     Rate and Rhythm: Normal rate and regular rhythm.  Pulmonary:     Effort: No tachypnea, accessory muscle usage or respiratory distress.     Breath sounds: Normal breath sounds. No decreased breath sounds or wheezing.  Chest:  Breasts:  Right: No inverted nipple, mass, nipple discharge or tenderness (no axillary adenopathy).     Left: No inverted nipple, mass, nipple discharge or tenderness (no axilarry adenopathy).  Abdominal:     General: Bowel sounds are normal.     Palpations: Abdomen is soft.     Tenderness: There is no abdominal tenderness.  Musculoskeletal:        General: No swelling or tenderness.     Cervical back: Neck supple.  Lymphadenopathy:     Cervical: No cervical adenopathy.  Skin:    Findings: No erythema or rash.  Neurological:     Mental Status: She is alert and oriented to person, place, and time.  Psychiatric:        Mood and Affect: Mood normal.        Behavior: Behavior normal.      Outpatient Encounter Medications as of 07/07/2023  Medication Sig   amLODipine (NORVASC) 5 MG tablet Take 1 tablet (5 mg total) by mouth daily.   aspirin 81 MG tablet Take 81 mg by mouth daily.   benazepril (LOTENSIN) 40 MG tablet Take 1 tablet (40 mg total) by mouth daily.   budesonide-formoterol (SYMBICORT) 80-4.5 MCG/ACT inhaler Inhale 2 puffs into the lungs in the morning and at bedtime.   cetirizine (ZYRTEC) 10 MG tablet Take 10 mg by mouth daily.   Cholecalciferol (VITAMIN D-3) 125 MCG (5000 UT) TABS Take 1 tablet by mouth daily.   cyanocobalamin (VITAMIN B12) 1000 MCG/ML injection Inject 1 mL (1,000 mcg total) into the muscle every 30 (thirty) days.   fluticasone (FLONASE) 50 MCG/ACT nasal spray Place 1 spray into both nostrils daily.   glipiZIDE (GLUCOTROL XL) 2.5 MG 24 hr tablet  Take 1 tablet by mouth once daily with breakfast   glucose blood (CONTOUR TEST) test strip USE 1 STRIP TO CHECK GLUCOSE ONCE DAILY   hydrochlorothiazide (HYDRODIURIL) 25 MG tablet Take 1/2 (one-half) tablet by mouth once daily   Lancets MISC Check sugars once a day Dx: E11.9 (Contour Next)   Multiple Vitamins-Minerals (ICAPS AREDS 2 PO) Take 1 tablet by mouth in the morning and at bedtime.   niacin 500 MG tablet Take 500 mg by mouth at bedtime.   omeprazole (PRILOSEC) 20 MG capsule Take 20 mg by mouth daily.   Syringe/Needle, Disp, (SYRINGE 3CC/25GX1") 25G X 1" 3 ML MISC Use as directed to administer b12 injections.   albuterol (VENTOLIN HFA) 108 (90 Base) MCG/ACT inhaler Inhale 2 puffs into the lungs every 6 (six) hours as needed for wheezing or shortness of breath. (Patient not taking: Reported on 05/11/2023)   [DISCONTINUED] ezetimibe (ZETIA) 10 MG tablet Take 1 tablet (10 mg total) by mouth daily. (Patient not taking: Reported on 05/11/2023)   No facility-administered encounter medications on file as of 07/07/2023.     Lab Results  Component Value Date   WBC 7.9 11/09/2022   HGB 12.6 11/09/2022   HCT 39.1 11/09/2022   PLT 322.0 11/09/2022   GLUCOSE 141 (H) 06/30/2023   CHOL 252 (H) 06/30/2023   TRIG 156.0 (H) 06/30/2023   HDL 48.00 06/30/2023   LDLDIRECT 150.0 12/31/2021   LDLCALC 173 (H) 06/30/2023   ALT 29 06/30/2023   AST 18 06/30/2023   NA 140 06/30/2023   K 4.5 06/30/2023   CL 101 06/30/2023   CREATININE 0.99 06/30/2023   BUN 23 06/30/2023   CO2 34 (H) 06/30/2023   TSH 3.34 05/04/2022   INR 0.9 04/03/2012   HGBA1C 7.8 (H) 06/30/2023  MICROALBUR 1.3 06/30/2023    MM 3D SCREENING MAMMOGRAM BILATERAL BREAST  Result Date: 07/01/2023 CLINICAL DATA:  Screening. EXAM: DIGITAL SCREENING BILATERAL MAMMOGRAM WITH TOMOSYNTHESIS AND CAD TECHNIQUE: Bilateral screening digital craniocaudal and mediolateral oblique mammograms were obtained. Bilateral screening digital breast  tomosynthesis was performed. The images were evaluated with computer-aided detection. COMPARISON:  Previous exam(s). ACR Breast Density Category b: There are scattered areas of fibroglandular density. FINDINGS: There are no findings suspicious for malignancy. IMPRESSION: No mammographic evidence of malignancy. A result letter of this screening mammogram will be mailed directly to the patient. RECOMMENDATION: Screening mammogram in one year. (Code:SM-B-01Y) BI-RADS CATEGORY  1: Negative. Electronically Signed   By: Ted Mcalpine M.D.   On: 07/01/2023 10:23       Assessment & Plan:  Health care maintenance Assessment & Plan: Physical today 07/07/23.  Mammogram 06/30/23 - Birads I.  Colonoscopy 06/2016 - tubular adenoma x 2 and hyperplastic polyp x 2.  Recommended f/u colonoscopy in 5 years.     Encounter for screening mammogram for malignant neoplasm of breast -     3D Screening Mammogram, Left and Right; Future  Pain in both knees, unspecified chronicity Assessment & Plan: Persistent knee pain as outlined.  Limits activity. Refer to ortho for further evaluation.   Orders: -     Ambulatory referral to Orthopedic Surgery  Colon cancer screening -     Ambulatory referral to Gastroenterology  Statin myopathy Assessment & Plan: Intolerant to statin medication.    Primary hypertension Assessment & Plan: Currently on benazepril and hydrochlorothiazide and amlodipine.  Blood pressure - checks here - wnl. Continue current medication regimen. Follow pressures.  Follow metabolic panel.    Hypercholesterolemia Assessment & Plan: Intolerant to statins.  Off repatha.  Did not tolerate zetia. On niacin. Low cholesterol diet and exercise.  Follow lipid panel and liver function tests.  Lab Results  Component Value Date   CHOL 252 (H) 06/30/2023   HDL 48.00 06/30/2023   LDLCALC 173 (H) 06/30/2023   LDLDIRECT 150.0 12/31/2021   TRIG 156.0 (H) 06/30/2023   CHOLHDL 5 06/30/2023       History of colonic polyps Assessment & Plan: Colonoscopy 12.2017 - one polyp in ascending colon and two rectal polyps (tubular adenomas).  recommended f/u in 5 years.  Discussed colonoscopy, age. She prefers referral to discuss with GI regarding question of need for f/u colonoscopy.    Gastroesophageal reflux disease, unspecified whether esophagitis present Assessment & Plan: Continue prilosec. Follow.    Type 2 diabetes mellitus with hyperglycemia, without long-term current use of insulin (HCC) Assessment & Plan: Since being off metformin, diarrhea has resolved.  Bowels better. Held on trying GLP 1 agonist, given concern regarding previous GI issues.  Did not tolerate jardiance.  a1c elevated.  On glipizide XL 2.5mg  q day.  A1c recent check - 7.8.  ok - 82 y/o. Follow sugars.  Low carb diet and exercise. Hold on making medication changes.    Aortic atherosclerosis (HCC) Assessment & Plan: Intolerant to statins.  Off repatha.  Intolerant to zetia as well. On niacin. Low cholesterol diet and exercise.  Follow lipid panel and liver function tests.  Lab Results  Component Value Date   CHOL 252 (H) 06/30/2023   HDL 48.00 06/30/2023   LDLCALC 173 (H) 06/30/2023   LDLDIRECT 150.0 12/31/2021   TRIG 156.0 (H) 06/30/2023   CHOLHDL 5 06/30/2023     Other orders -     amLODIPine Besylate; Take 1 tablet (  5 mg total) by mouth daily.  Dispense: 90 tablet; Refill: 1     Dale Chandler, MD

## 2023-07-07 NOTE — Assessment & Plan Note (Signed)
Physical today 07/07/23.  Mammogram 06/30/23 - Birads I.  Colonoscopy 06/2016 - tubular adenoma x 2 and hyperplastic polyp x 2.  Recommended f/u colonoscopy in 5 years.

## 2023-07-12 ENCOUNTER — Encounter: Payer: Self-pay | Admitting: Internal Medicine

## 2023-07-12 NOTE — Assessment & Plan Note (Signed)
Intolerant to statins.  Off repatha.  Did not tolerate zetia. On niacin. Low cholesterol diet and exercise.  Follow lipid panel and liver function tests.  Lab Results  Component Value Date   CHOL 252 (H) 06/30/2023   HDL 48.00 06/30/2023   LDLCALC 173 (H) 06/30/2023   LDLDIRECT 150.0 12/31/2021   TRIG 156.0 (H) 06/30/2023   CHOLHDL 5 06/30/2023

## 2023-07-12 NOTE — Assessment & Plan Note (Signed)
Intolerant to statin medication.

## 2023-07-12 NOTE — Assessment & Plan Note (Signed)
 Currently on benazepril and hydrochlorothiazide and amlodipine.  Blood pressure - checks here - wnl. Continue current medication regimen. Follow pressures.  Follow metabolic panel.

## 2023-07-12 NOTE — Assessment & Plan Note (Signed)
Since being off metformin, diarrhea has resolved.  Bowels better. Held on trying GLP 1 agonist, given concern regarding previous GI issues.  Did not tolerate jardiance.  a1c elevated.  On glipizide XL 2.5mg  q day.  A1c recent check - 7.8.  ok - 82 y/o. Follow sugars.  Low carb diet and exercise. Hold on making medication changes.

## 2023-07-12 NOTE — Assessment & Plan Note (Signed)
Persistent knee pain as outlined.  Limits activity. Refer to ortho for further evaluation.

## 2023-07-12 NOTE — Assessment & Plan Note (Signed)
Intolerant to statins.  Off repatha.  Intolerant to zetia as well. On niacin. Low cholesterol diet and exercise.  Follow lipid panel and liver function tests.  Lab Results  Component Value Date   CHOL 252 (H) 06/30/2023   HDL 48.00 06/30/2023   LDLCALC 173 (H) 06/30/2023   LDLDIRECT 150.0 12/31/2021   TRIG 156.0 (H) 06/30/2023   CHOLHDL 5 06/30/2023

## 2023-07-12 NOTE — Assessment & Plan Note (Signed)
Continue prilosec. Follow.  

## 2023-07-12 NOTE — Assessment & Plan Note (Signed)
Colonoscopy 12.2017 - one polyp in ascending colon and two rectal polyps (tubular adenomas).  recommended f/u in 5 years.  Discussed colonoscopy, age. She prefers referral to discuss with GI regarding question of need for f/u colonoscopy.

## 2023-07-13 ENCOUNTER — Other Ambulatory Visit: Payer: Self-pay | Admitting: Internal Medicine

## 2023-07-29 ENCOUNTER — Encounter: Payer: Self-pay | Admitting: Internal Medicine

## 2023-07-29 ENCOUNTER — Other Ambulatory Visit: Payer: Self-pay

## 2023-07-29 ENCOUNTER — Other Ambulatory Visit (HOSPITAL_COMMUNITY): Payer: Self-pay

## 2023-07-29 NOTE — Telephone Encounter (Signed)
 Please call and find out what is going on - may need to schedule a virtual visit.  Need more information.

## 2023-07-29 NOTE — Telephone Encounter (Signed)
 Patient scheduled for work in virtual visit to discuss. Ears feel full and aching, cough, congestion. No SOB, wheezing, fever etc. Unable to come in office for appt due to transportation issues.

## 2023-07-30 ENCOUNTER — Telehealth (INDEPENDENT_AMBULATORY_CARE_PROVIDER_SITE_OTHER): Payer: Medicare Other | Admitting: Internal Medicine

## 2023-07-30 VITALS — Ht 64.0 in | Wt 215.0 lb

## 2023-07-30 DIAGNOSIS — R059 Cough, unspecified: Secondary | ICD-10-CM | POA: Diagnosis not present

## 2023-07-30 DIAGNOSIS — E1165 Type 2 diabetes mellitus with hyperglycemia: Secondary | ICD-10-CM | POA: Diagnosis not present

## 2023-07-30 DIAGNOSIS — I10 Essential (primary) hypertension: Secondary | ICD-10-CM | POA: Diagnosis not present

## 2023-07-30 MED ORDER — ALBUTEROL SULFATE HFA 108 (90 BASE) MCG/ACT IN AERS: 2.0000 | INHALATION_SPRAY | Freq: Four times a day (QID) | RESPIRATORY_TRACT | 1 refills | Status: DC | PRN

## 2023-07-30 MED ORDER — PREDNISONE 10 MG PO TABS: ORAL_TABLET | ORAL | 0 refills | Status: DC

## 2023-07-30 MED ORDER — DOXYCYCLINE HYCLATE 100 MG PO TABS: 100.0000 mg | ORAL_TABLET | Freq: Two times a day (BID) | ORAL | 0 refills | Status: DC

## 2023-07-30 NOTE — Progress Notes (Signed)
 Patient ID: Misty Jones, female   DOB: [birth year], 83 y.o.   MRN: [id number]   Virtual Visit via video Note  I connected with Lanie Gauss by a video enabled telemedicine application and verified that I am speaking with the correct person using two identifiers. Location patient: home Location provider: work  Persons participating in the virtual visit: patient, provider  The limitations, risks, security and privacy concerns of performing an evaluation and management service by video and the availability of in person appointments have bene discussed. It has also been discussed with the patient that there may be a patient responsible charge related to this service. The patient expressed understanding and agreed to proceed.  Reason for visit: work in appt.   HPI: Work in with concerns regarding cough and congestion and ear ache. Reports that starting Saturday 07/24/23 - she developed sore throat. Some congestion. Ears hurting. Took robitussin. Symptoms progressed - glands neck - swollen/painful. Increased nasal congestion. No headache. Increased cough - occasionally productive. Minimal diarrhea. No chest pain or sob. Some coughing fits. Decreased apptite. Has taken some robitussin. Discussed flonase  nasal spray.    ROS: See pertinent positives and negatives per HPI.  Past Medical History:  Diagnosis Date   B12 deficiency    Chicken pox    Diabetes mellitus (HCC)    GERD (gastroesophageal reflux disease)    History of diverticulitis of colon    History of dysplastic nevus 07/05/2018   left mid back shv - with moderate atypia   Hypercholesterolemia    Hypertension    Impacted cerumen of left ear 07/01/2022   Nasal drainage    Non-recurrent acute suppurative otitis media of right ear without spontaneous rupture of tympanic membrane 06/16/2022    Past Surgical History:  Procedure Laterality Date   ABDOMINAL HYSTERECTOMY  1984   BACK SURGERY  1986   ruptured disc, Dr [facility])   CHOLECYSTECTOMY  1991   CHOLECYSTECTOMY  1992   COLONOSCOPY WITH PROPOFOL  N/A 06/29/2016   Procedure: COLONOSCOPY WITH PROPOFOL ;  Surgeon: Lamar ONEIDA Holmes, MD;  Location: [facility] ENDOSCOPY;  Service: Endoscopy;  Laterality: N/A;   ESOPHAGOGASTRODUODENOSCOPY (EGD) WITH PROPOFOL  N/A 12/13/2017   Procedure: ESOPHAGOGASTRODUODENOSCOPY (EGD) WITH PROPOFOL ;  Surgeon: Holmes Lamar ONEIDA, MD;  Location: [facility] ENDOSCOPY;  Service: Endoscopy;  Laterality: N/A;   TONSILLECTOMY      Family History  Problem Relation Age of Onset   Hypertension Mother    Cancer Mother        Mouth cancer   Heart disease Father        myocardial infarction   Raynaud syndrome Sister    Asthma Sister    Congenital heart disease Sister    Thyroid  disease Sister    Thyroid  cancer Daughter    Breast cancer Neg Hx    Colon cancer Neg Hx     SOCIAL HX: reviewed.    Current Outpatient Medications:    doxycycline  (VIBRA -TABS) 100 MG tablet, Take 1 tablet (100 mg total) by mouth 2 (two) times daily., Disp: 14 tablet, Rfl: 0   predniSONE  (DELTASONE ) 10 MG tablet, Take 4 tablets x 1 day and then decrease by 1/2 tablet per day until down to zero mg., Disp: 18 tablet, Rfl: 0   albuterol  (VENTOLIN  HFA) 108 (90 Base) MCG/ACT inhaler, Inhale 2 puffs into the lungs every 6 (six) hours as needed for wheezing or shortness of breath., Disp: 18 g, Rfl: 1   amLODipine  (NORVASC ) 5 MG tablet, Take 1 tablet (  5 mg total) by mouth daily., Disp: 90 tablet, Rfl: 1   aspirin  81 MG tablet, Take 81 mg by mouth daily., Disp: , Rfl:    benazepril  (LOTENSIN ) 40 MG tablet, Take 1 tablet by mouth once daily, Disp: 90 tablet, Rfl: 0   budesonide -formoterol  (SYMBICORT ) 80-4.5 MCG/ACT inhaler, Inhale 2 puffs into the lungs in the morning and at bedtime., Disp: 10.2 g, Rfl: 12   cetirizine (ZYRTEC) 10 MG tablet, Take 10 mg by mouth daily., Disp: , Rfl:    Cholecalciferol (VITAMIN D -3) 125 MCG (5000 UT) TABS, Take 1 tablet by mouth daily., Disp: ,  Rfl:    cyanocobalamin  (VITAMIN B12) 1000 MCG/ML injection, Inject 1 mL (1,000 mcg total) into the muscle every 30 (thirty) days., Disp: 12 mL, Rfl: 0   fluticasone  (FLONASE ) 50 MCG/ACT nasal spray, Place 1 spray into both nostrils daily., Disp: 18.2 mL, Rfl: 11   glipiZIDE  (GLUCOTROL  XL) 2.5 MG 24 hr tablet, Take 1 tablet by mouth once daily with breakfast, Disp: 90 tablet, Rfl: 0   glucose blood (CONTOUR TEST) test strip, USE 1 STRIP TO CHECK GLUCOSE ONCE DAILY, Disp: 50 each, Rfl: 11   hydrochlorothiazide  (HYDRODIURIL ) 25 MG tablet, Take 1/2 (one-half) tablet by mouth once daily, Disp: 90 tablet, Rfl: 0   Lancets MISC, Check sugars once a day Dx: E11.9 (Contour Next), Disp: 100 each, Rfl: 3   Multiple Vitamins-Minerals (ICAPS AREDS 2 PO), Take 1 tablet by mouth in the morning and at bedtime., Disp: , Rfl:    niacin 500 MG tablet, Take 500 mg by mouth at bedtime., Disp: , Rfl:    omeprazole  (PRILOSEC ) 20 MG capsule, Take 20 mg by mouth daily., Disp: , Rfl:    Syringe/Needle, Disp, (SYRINGE 3CC/25GX1) 25G X 1 3 ML MISC, Use as directed to administer b12 injections., Disp: 50 each, Rfl: 1  EXAM:  GENERAL: alert, oriented, appears well and in no acute distress  HEENT: atraumatic, conjunttiva clear, no obvious abnormalities on inspection of external nose and ears  NECK: normal movements of the head and neck  LUNGS: on inspection no signs of respiratory distress, breathing rate appears normal, no obvious gross SOB, gasping or wheezing  CV: no obvious cyanosis  PSYCH/NEURO: pleasant and cooperative, no obvious depression or anxiety, speech and thought processing grossly intact  ASSESSMENT AND PLAN:  Discussed the following assessment and plan:  Problem List Items Addressed This Visit     Cough - Primary   Cough and congestion as outlined.  Symptoms appear to be c/w URI. Some coughing fits. Continue robitussin as directed. Flonase  nasal spray. Prednisone  taper as directed. Doxycycline .  Albuterol  inhaeler prn.  Follow.  Call with update.       Diabetes mellitus (HCC)   Prednisone  may elevate sugar.  Will need to follow sugars. Stay hydrated.       Hypertension   Currently on benazepril  and hydrochlorothiazide  and amlodipine .  Follow pressures.        Return if symptoms worsen or fail to improve.   I discussed the assessment and treatment plan with the patient. The patient was provided an opportunity to ask questions and all were answered. The patient agreed with the plan and demonstrated an understanding of the instructions.   The patient was advised to call back or seek an in-person evaluation if the symptoms worsen or if the condition fails to improve as anticipated.    Allena Hamilton, MD

## 2023-07-31 ENCOUNTER — Encounter: Payer: Self-pay | Admitting: Internal Medicine

## 2023-07-31 NOTE — Assessment & Plan Note (Signed)
 Prednisone may elevate sugar.  Will need to follow sugars. Stay hydrated.

## 2023-07-31 NOTE — Assessment & Plan Note (Signed)
 Currently on benazepril and hydrochlorothiazide and amlodipine.  Follow pressures.

## 2023-07-31 NOTE — Assessment & Plan Note (Signed)
 Cough and congestion as outlined.  Symptoms appear to be c/w URI. Some coughing fits. Continue robitussin as directed. Flonase nasal spray. Prednisone taper as directed. Doxycycline. Albuterol inhaeler prn.  Follow.  Call with update.

## 2023-08-03 ENCOUNTER — Other Ambulatory Visit (HOSPITAL_COMMUNITY): Payer: Self-pay

## 2023-08-09 ENCOUNTER — Other Ambulatory Visit: Payer: Self-pay | Admitting: Internal Medicine

## 2023-08-10 ENCOUNTER — Encounter: Payer: Self-pay | Admitting: Internal Medicine

## 2023-08-11 NOTE — Telephone Encounter (Signed)
 Called patient to clarify. She is going to call ENT for appt and let me know if I need to do anything.

## 2023-08-13 DIAGNOSIS — H353124 Nonexudative age-related macular degeneration, left eye, advanced atrophic with subfoveal involvement: Secondary | ICD-10-CM | POA: Diagnosis not present

## 2023-08-23 ENCOUNTER — Other Ambulatory Visit: Payer: Self-pay | Admitting: Internal Medicine

## 2023-08-23 MED ORDER — CYANOCOBALAMIN 1000 MCG/ML IJ SOLN: 1000.0000 ug | INTRAMUSCULAR | 0 refills | Status: DC

## 2023-09-02 DIAGNOSIS — H6123 Impacted cerumen, bilateral: Secondary | ICD-10-CM | POA: Diagnosis not present

## 2023-09-02 DIAGNOSIS — H6981 Other specified disorders of Eustachian tube, right ear: Secondary | ICD-10-CM | POA: Diagnosis not present

## 2023-09-09 DIAGNOSIS — H6981 Other specified disorders of Eustachian tube, right ear: Secondary | ICD-10-CM | POA: Diagnosis not present

## 2023-09-09 DIAGNOSIS — H903 Sensorineural hearing loss, bilateral: Secondary | ICD-10-CM | POA: Diagnosis not present

## 2023-09-09 DIAGNOSIS — J301 Allergic rhinitis due to pollen: Secondary | ICD-10-CM | POA: Diagnosis not present

## 2023-09-24 DIAGNOSIS — H353221 Exudative age-related macular degeneration, left eye, with active choroidal neovascularization: Secondary | ICD-10-CM | POA: Diagnosis not present

## 2023-09-30 DIAGNOSIS — M25562 Pain in left knee: Secondary | ICD-10-CM | POA: Diagnosis not present

## 2023-09-30 DIAGNOSIS — M25561 Pain in right knee: Secondary | ICD-10-CM | POA: Diagnosis not present

## 2023-09-30 DIAGNOSIS — G8929 Other chronic pain: Secondary | ICD-10-CM | POA: Diagnosis not present

## 2023-09-30 DIAGNOSIS — M170 Bilateral primary osteoarthritis of knee: Secondary | ICD-10-CM | POA: Diagnosis not present

## 2023-10-06 ENCOUNTER — Other Ambulatory Visit: Payer: Self-pay | Admitting: Internal Medicine

## 2023-10-21 DIAGNOSIS — J301 Allergic rhinitis due to pollen: Secondary | ICD-10-CM | POA: Diagnosis not present

## 2023-10-21 DIAGNOSIS — H6981 Other specified disorders of Eustachian tube, right ear: Secondary | ICD-10-CM | POA: Diagnosis not present

## 2023-10-22 DIAGNOSIS — H353221 Exudative age-related macular degeneration, left eye, with active choroidal neovascularization: Secondary | ICD-10-CM | POA: Diagnosis not present

## 2023-10-28 ENCOUNTER — Other Ambulatory Visit: Payer: Self-pay

## 2023-10-28 DIAGNOSIS — E7800 Pure hypercholesterolemia, unspecified: Secondary | ICD-10-CM

## 2023-10-28 DIAGNOSIS — I10 Essential (primary) hypertension: Secondary | ICD-10-CM

## 2023-10-28 DIAGNOSIS — E1165 Type 2 diabetes mellitus with hyperglycemia: Secondary | ICD-10-CM

## 2023-11-02 DIAGNOSIS — K219 Gastro-esophageal reflux disease without esophagitis: Secondary | ICD-10-CM | POA: Diagnosis not present

## 2023-11-02 DIAGNOSIS — Z860100 Personal history of colon polyps, unspecified: Secondary | ICD-10-CM | POA: Diagnosis not present

## 2023-11-03 ENCOUNTER — Other Ambulatory Visit: Payer: Medicare Other

## 2023-11-05 ENCOUNTER — Ambulatory Visit: Payer: Medicare Other | Admitting: Internal Medicine

## 2023-11-05 ENCOUNTER — Encounter: Payer: Self-pay | Admitting: Internal Medicine

## 2023-11-05 VITALS — BP 126/72 | HR 88 | Temp 98.0°F | Resp 16 | Ht 64.0 in | Wt 218.0 lb

## 2023-11-05 DIAGNOSIS — G720 Drug-induced myopathy: Secondary | ICD-10-CM | POA: Diagnosis not present

## 2023-11-05 DIAGNOSIS — E7800 Pure hypercholesterolemia, unspecified: Secondary | ICD-10-CM | POA: Diagnosis not present

## 2023-11-05 DIAGNOSIS — M25561 Pain in right knee: Secondary | ICD-10-CM | POA: Diagnosis not present

## 2023-11-05 DIAGNOSIS — I700 Atherosclerosis of aorta: Secondary | ICD-10-CM

## 2023-11-05 DIAGNOSIS — T466X5A Adverse effect of antihyperlipidemic and antiarteriosclerotic drugs, initial encounter: Secondary | ICD-10-CM

## 2023-11-05 DIAGNOSIS — E1165 Type 2 diabetes mellitus with hyperglycemia: Secondary | ICD-10-CM

## 2023-11-05 DIAGNOSIS — M25562 Pain in left knee: Secondary | ICD-10-CM | POA: Diagnosis not present

## 2023-11-05 DIAGNOSIS — M25512 Pain in left shoulder: Secondary | ICD-10-CM

## 2023-11-05 DIAGNOSIS — I10 Essential (primary) hypertension: Secondary | ICD-10-CM | POA: Diagnosis not present

## 2023-11-05 DIAGNOSIS — K219 Gastro-esophageal reflux disease without esophagitis: Secondary | ICD-10-CM

## 2023-11-05 LAB — HM DIABETES FOOT EXAM

## 2023-11-05 NOTE — Progress Notes (Signed)
 Subjective:    Patient ID: Misty Jones, female    DOB: [birth year], 83 y.o.   MRN: [id number]  Patient here for  Chief Complaint  Patient presents with   Medical Management of Chronic Issues    HPI Here for a scheduled follow up - follow up regarding diabetes, hypercholesterolemia and hypertension. Saw ortho 09/30/23 - bilateral knee pain. Recommended to start celebrex. She never started. Encouraged ice and elevation. Still having knee pain. Not exercising. Limited in her activity. She is also having left shoulder pain. Discussed. Will notify me if desires f/u. Discussed possible injection. Discussed taking scheduled tylenol. Had f/u with GI 11/02/23 - did not feel colonoscopy warranted. Recommended to continue omeprazole. She is following with ophthalmology regarding her eyes. Receiving injection q month. Decreased vision. Increased stress related to this. Using astelin q hs. Helps with drainage. Not using symbicort. No sob. No increased cough.    Past Medical History:  Diagnosis Date   B12 deficiency    Chicken pox    Diabetes mellitus (HCC)    GERD (gastroesophageal reflux disease)    History of diverticulitis of colon    History of dysplastic nevus 07/05/2018   left mid back shv - with moderate atypia   Hypercholesterolemia    Hypertension    Impacted cerumen of left ear 07/01/2022   Nasal drainage    Non-recurrent acute suppurative otitis media of right ear without spontaneous rupture of tympanic membrane 06/16/2022   Past Surgical History:  Procedure Laterality Date   ABDOMINAL HYSTERECTOMY  1984   BACK SURGERY  1986   ruptured disc, Dr [facility])   CHOLECYSTECTOMY  1991   CHOLECYSTECTOMY  1992   COLONOSCOPY WITH PROPOFOL N/A 06/29/2016   Procedure: COLONOSCOPY WITH PROPOFOL;  Surgeon: Cassie Click, MD;  Location: [facility] ENDOSCOPY;  Service: Endoscopy;  Laterality: N/A;   ESOPHAGOGASTRODUODENOSCOPY (EGD) WITH PROPOFOL N/A 12/13/2017   Procedure:  ESOPHAGOGASTRODUODENOSCOPY (EGD) WITH PROPOFOL;  Surgeon: Cassie Click, MD;  Location: [facility] ENDOSCOPY;  Service: Endoscopy;  Laterality: N/A;   TONSILLECTOMY     Family History  Problem Relation Age of Onset   Hypertension Mother    Cancer Mother        Mouth cancer   Heart disease Father        myocardial infarction   Raynaud syndrome Sister    Asthma Sister    Congenital heart disease Sister    Thyroid disease Sister    Thyroid cancer Daughter    Breast cancer Neg Hx    Colon cancer Neg Hx    Social History   Socioeconomic History   Marital status: Divorced    Spouse name: Not on file   Number of children: 2   Years of education: Not on file   Highest education level: Not on file  Occupational History   Occupation: retired  Tobacco Use   Smoking status: Former    Current packs/day: 0.00    Average packs/day: 0.5 packs/day for 20.0 years (10.0 ttl pk-yrs)    Types: Cigarettes    Start date: 07/26/1964    Quit date: 07/26/1984    Years since quitting: 39.3   Smokeless tobacco: Never   Tobacco comments:    quit smoking years ago  Vaping Use   Vaping status: Never Used  Substance and Sexual Activity   Alcohol use: No    Alcohol/week: 0.0 standard drinks of alcohol   Drug use: No   Sexual activity: Never  Other Topics  Concern   Not on file  Social History Narrative   divorced   Social Drivers of Health   Financial Resource Strain: Low Risk  (11/02/2023)   Received from [facility]   Overall Financial Resource Strain (CARDIA)    Difficulty of Paying Living Expenses: Not hard at all  Food Insecurity: No Food Insecurity (11/02/2023)   Received from [facility]   Hunger Vital Sign    Worried About Running Out of Food in the Last Year: Never true    Ran Out of Food in the Last Year: Never true  Transportation Needs: No Transportation Needs (11/02/2023)   Received from [facility] - Transportation     In the past 12 months, has lack of transportation kept you from medical appointments or from getting medications?: No    Lack of Transportation (Non-Medical): No  Physical Activity: Inactive (05/11/2023)   Exercise Vital Sign    Days of Exercise per Week: 0 days    Minutes of Exercise per Session: 0 min  Stress: No Stress Concern Present (05/11/2023)   [employer] of Occupational Health - Occupational Stress Questionnaire    Feeling of Stress : Not at all  Social Connections: Moderately Isolated (05/11/2023)   Social Connection and Isolation Panel [NHANES]    Frequency of Communication with Friends and Family: More than three times a week    Frequency of Social Gatherings with Friends and Family: Once a week    Attends Religious Services: More than 4 times per year    Active Member of [employer] or Organizations: No    Attends Banker Meetings: Never    Marital Status: Divorced     Review of Systems  Constitutional:  Negative for appetite change and unexpected weight change.  HENT:  Negative for sinus pressure.        Drainage improved with astelin.   Respiratory:  Negative for cough, chest tightness and shortness of breath.   Cardiovascular:  Negative for chest pain and palpitations.       No increased swelling.   Gastrointestinal:  Negative for abdominal pain, diarrhea, nausea and vomiting.  Genitourinary:  Negative for difficulty urinating and dysuria.  Musculoskeletal:  Negative for myalgias.       Knee pain as outlined.   Skin:  Negative for color change and rash.  Neurological:  Negative for dizziness and headaches.  Psychiatric/Behavioral:  Negative for agitation and dysphoric mood.        Objective:     BP 126/72   Pulse 88   Temp 98 F (36.7 C)   Resp 16   Ht 5\' 4"  (1.626 m)   Wt 218 lb (98.9 kg)   SpO2 98%   BMI 37.42 kg/m  Wt Readings from Last 3 Encounters:  11/05/23 218 lb (98.9 kg)  07/30/23 215 lb (97.5 kg)  07/07/23 215 lb (97.5 kg)     Physical Exam Vitals reviewed.  Constitutional:      General: She is not in acute distress.    Appearance: Normal appearance.  HENT:     Head: Normocephalic and atraumatic.     Right Ear: External ear normal.     Left Ear: External ear normal.     Mouth/Throat:     Pharynx: No oropharyngeal exudate or posterior oropharyngeal erythema.  Eyes:     General: No scleral icterus.       Right eye: No discharge.  Left eye: No discharge.     Conjunctiva/sclera: Conjunctivae normal.  Neck:     Thyroid: No thyromegaly.  Cardiovascular:     Rate and Rhythm: Normal rate and regular rhythm.  Pulmonary:     Effort: No respiratory distress.     Breath sounds: Normal breath sounds. No wheezing.  Abdominal:     General: Bowel sounds are normal.     Palpations: Abdomen is soft.     Tenderness: There is no abdominal tenderness.  Musculoskeletal:        General: No swelling or tenderness.     Cervical back: Neck supple. No tenderness.  Lymphadenopathy:     Cervical: No cervical adenopathy.  Skin:    Findings: No erythema or rash.  Neurological:     Mental Status: She is alert.  Psychiatric:        Mood and Affect: Mood normal.        Behavior: Behavior normal.         Outpatient Encounter Medications as of 11/05/2023  Medication Sig   azelastine (ASTELIN) 0.1 % nasal spray Place into the nose.   albuterol (VENTOLIN HFA) 108 (90 Base) MCG/ACT inhaler Inhale 2 puffs into the lungs every 6 (six) hours as needed for wheezing or shortness of breath.   amLODipine (NORVASC) 5 MG tablet Take 1 tablet (5 mg total) by mouth daily.   aspirin 81 MG tablet Take 81 mg by mouth daily.   benazepril (LOTENSIN) 40 MG tablet Take 1 tablet by mouth once daily   budesonide-formoterol (SYMBICORT) 80-4.5 MCG/ACT inhaler Inhale 2 puffs into the lungs in the morning and at bedtime.   cetirizine (ZYRTEC) 10 MG tablet Take 10 mg by mouth daily.   Cholecalciferol (VITAMIN D-3) 125 MCG (5000 UT) TABS  Take 1 tablet by mouth daily.   cyanocobalamin (VITAMIN B12) 1000 MCG/ML injection Inject 1 mL (1,000 mcg total) into the muscle every 30 (thirty) days.   fluticasone (FLONASE) 50 MCG/ACT nasal spray Place 1 spray into both nostrils daily.   glipiZIDE (GLUCOTROL XL) 2.5 MG 24 hr tablet Take 1 tablet by mouth once daily with breakfast   glucose blood (CONTOUR TEST) test strip USE 1 STRIP TO CHECK GLUCOSE ONCE DAILY   hydrochlorothiazide (HYDRODIURIL) 25 MG tablet Take 1/2 (one-half) tablet by mouth once daily   Lancets MISC Check sugars once a day Dx: E11.9 (Contour Next)   Multiple Vitamins-Minerals (ICAPS AREDS 2 PO) Take 1 tablet by mouth in the morning and at bedtime.   niacin 500 MG tablet Take 500 mg by mouth at bedtime.   omeprazole (PRILOSEC) 20 MG capsule Take 20 mg by mouth daily.   Syringe/Needle, Disp, (SYRINGE 3CC/25GX1") 25G X 1" 3 ML MISC Use as directed to administer b12 injections.   [DISCONTINUED] doxycycline (VIBRA-TABS) 100 MG tablet Take 1 tablet (100 mg total) by mouth 2 (two) times daily.   [DISCONTINUED] predniSONE (DELTASONE) 10 MG tablet Take 4 tablets x 1 day and then decrease by 1/2 tablet per day until down to zero mg.   No facility-administered encounter medications on file as of 11/05/2023.     Lab Results  Component Value Date   WBC 9.5 11/05/2023   HGB 12.8 11/05/2023   HCT 39.7 11/05/2023   PLT 317 11/05/2023   GLUCOSE 98 11/05/2023   CHOL 269 (H) 11/05/2023   TRIG 168 (H) 11/05/2023   HDL 55 11/05/2023   LDLDIRECT 150.0 12/31/2021   LDLCALC 182 (H) 11/05/2023   ALT 30 (H)  11/05/2023   AST 25 11/05/2023   NA 141 11/05/2023   K 4.1 11/05/2023   CL 100 11/05/2023   CREATININE 0.93 11/05/2023   BUN 19 11/05/2023   CO2 30 11/05/2023   TSH 3.38 11/05/2023   INR 0.9 04/03/2012   HGBA1C 8.2 (H) 11/05/2023   MICROALBUR 1.3 06/30/2023    MM 3D SCREENING MAMMOGRAM BILATERAL BREAST Result Date: 07/01/2023 CLINICAL DATA:  Screening. EXAM: DIGITAL  SCREENING BILATERAL MAMMOGRAM WITH TOMOSYNTHESIS AND CAD TECHNIQUE: Bilateral screening digital craniocaudal and mediolateral oblique mammograms were obtained. Bilateral screening digital breast tomosynthesis was performed. The images were evaluated with computer-aided detection. COMPARISON:  Previous exam(s). ACR Breast Density Category b: There are scattered areas of fibroglandular density. FINDINGS: There are no findings suspicious for malignancy. IMPRESSION: No mammographic evidence of malignancy. A result letter of this screening mammogram will be mailed directly to the patient. RECOMMENDATION: Screening mammogram in one year. (Code:SM-B-01Y) BI-RADS CATEGORY  1: Negative. Electronically Signed   By: Dobrinka  Dimitrova M.D.   On: 07/01/2023 10:23       Assessment & Plan:  Type 2 diabetes mellitus with hyperglycemia, without long-term current use of insulin (HCC) Assessment & Plan: Low carb diet and exercise. She is limited regarding her activity due to her knee issues. Discussed. Follow met b and A1c.   Orders: -     Hemoglobin A1c  Hypercholesterolemia Assessment & Plan: Intolerant to statins.  Off repatha.  Did not tolerate zetia. On niacin. Low cholesterol diet and exercise.  Follow lipid panel and liver function tests. Recheck today.  Lab Results  Component Value Date   CHOL 269 (H) 11/05/2023   HDL 55 11/05/2023   LDLCALC 182 (H) 11/05/2023   LDLDIRECT 150.0 12/31/2021   TRIG 168 (H) 11/05/2023   CHOLHDL 4.9 11/05/2023     Orders: -     Lipid panel -     Hepatic function panel  Primary hypertension Assessment & Plan: Currently on benazepril and hydrochlorothiazide and amlodipine.  No change today. Follow pressures. Follow metabolic panel.   Orders: -     TSH -     CBC with Differential/Platelet -     Basic Metabolic Panel Without GFR  Aortic atherosclerosis (HCC) Assessment & Plan: Intolerant to statins.  Off repatha.  Intolerant to zetia as well. On niacin. Low  cholesterol diet and exercise.  Follow lipid panel and liver function tests. Recheck today.  Lab Results  Component Value Date   CHOL 269 (H) 11/05/2023   HDL 55 11/05/2023   LDLCALC 182 (H) 11/05/2023   LDLDIRECT 150.0 12/31/2021   TRIG 168 (H) 11/05/2023   CHOLHDL 4.9 11/05/2023     Gastroesophageal reflux disease, unspecified whether esophagitis present Assessment & Plan: Continue prilosec. No upper symptoms reported.    Pain in both knees, unspecified chronicity Assessment & Plan: Persistent pain - knees as outlined. Saw ortho. Discussed possible injection. Take tylenol scheduled. Will notify me or f/u with ortho if persistent issues.    Statin myopathy Assessment & Plan: Intolerant to statin medication as outlined.    Left shoulder pain, unspecified chronicity Assessment & Plan: Has received left shoulder injection previously. Tylenol - scheduled. Will notify when ready for f/u with ortho.       Dellar Fenton, MD

## 2023-11-06 ENCOUNTER — Encounter: Payer: Self-pay | Admitting: Internal Medicine

## 2023-11-06 ENCOUNTER — Other Ambulatory Visit: Payer: Self-pay | Admitting: Internal Medicine

## 2023-11-06 DIAGNOSIS — R7989 Other specified abnormal findings of blood chemistry: Secondary | ICD-10-CM

## 2023-11-06 LAB — HEPATIC FUNCTION PANEL
AG Ratio: 1.7 (calc) (ref 1.0–2.5)
ALT: 30 U/L — ABNORMAL HIGH (ref 6–29)
AST: 25 U/L (ref 10–35)
Albumin: 4 g/dL (ref 3.6–5.1)
Alkaline phosphatase (APISO): 79 U/L (ref 37–153)
Bilirubin, Direct: 0.1 mg/dL (ref 0.0–0.2)
Globulin: 2.3 g/dL (ref 1.9–3.7)
Indirect Bilirubin: 0.4 mg/dL (ref 0.2–1.2)
Total Bilirubin: 0.5 mg/dL (ref 0.2–1.2)
Total Protein: 6.3 g/dL (ref 6.1–8.1)

## 2023-11-06 LAB — LIPID PANEL
Cholesterol: 269 mg/dL — ABNORMAL HIGH (ref <200)
HDL: 55 mg/dL (ref >=50)
LDL Cholesterol (Calc): 182 mg/dL — ABNORMAL HIGH
Non-HDL Cholesterol (Calc): 214 mg/dL — ABNORMAL HIGH (ref <130)
Total CHOL/HDL Ratio: 4.9 (calc) (ref <5.0)
Triglycerides: 168 mg/dL — ABNORMAL HIGH (ref <150)

## 2023-11-06 LAB — TSH: TSH: 3.38 m[IU]/L (ref 0.40–4.50)

## 2023-11-06 LAB — CBC WITH DIFFERENTIAL/PLATELET
Absolute Lymphocytes: 3059 {cells}/uL (ref 850–3900)
Absolute Monocytes: 599 {cells}/uL (ref 200–950)
Basophils Absolute: 76 {cells}/uL (ref 0–200)
Basophils Relative: 0.8 %
Eosinophils Absolute: 209 {cells}/uL (ref 15–500)
Eosinophils Relative: 2.2 %
HCT: 39.7 % (ref 35.0–45.0)
Hemoglobin: 12.8 g/dL (ref 11.7–15.5)
MCH: 29.4 pg (ref 27.0–33.0)
MCHC: 32.2 g/dL (ref 32.0–36.0)
MCV: 91.3 fL (ref 80.0–100.0)
MPV: 10.8 fL (ref 7.5–12.5)
Monocytes Relative: 6.3 %
Neutro Abs: 5558 {cells}/uL (ref 1500–7800)
Neutrophils Relative %: 58.5 %
Platelets: 317 10*3/uL (ref 140–400)
RBC: 4.35 10*6/uL (ref 3.80–5.10)
RDW: 12.6 % (ref 11.0–15.0)
Total Lymphocyte: 32.2 %
WBC: 9.5 10*3/uL (ref 3.8–10.8)

## 2023-11-06 LAB — BASIC METABOLIC PANEL WITHOUT GFR
BUN: 19 mg/dL (ref 7–25)
CO2: 30 mmol/L (ref 20–32)
Calcium: 9.1 mg/dL (ref 8.6–10.4)
Chloride: 100 mmol/L (ref 98–110)
Creat: 0.93 mg/dL (ref 0.60–0.95)
Glucose, Bld: 98 mg/dL (ref 65–99)
Potassium: 4.1 mmol/L (ref 3.5–5.3)
Sodium: 141 mmol/L (ref 135–146)

## 2023-11-06 LAB — HEMOGLOBIN A1C
Hgb A1c MFr Bld: 8.2 %{Hb} — ABNORMAL HIGH (ref <5.7)
Mean Plasma Glucose: 189 mg/dL
eAG (mmol/L): 10.4 mmol/L

## 2023-11-06 NOTE — Assessment & Plan Note (Signed)
 Has received left shoulder injection previously. Tylenol - scheduled. Will notify when ready for f/u with ortho.

## 2023-11-06 NOTE — Assessment & Plan Note (Signed)
 Persistent pain - knees as outlined. Saw ortho. Discussed possible injection. Take tylenol scheduled. Will notify me or f/u with ortho if persistent issues.

## 2023-11-06 NOTE — Assessment & Plan Note (Signed)
 Intolerant to statins.  Off repatha.  Intolerant to zetia as well. On niacin. Low cholesterol diet and exercise.  Follow lipid panel and liver function tests. Recheck today.  Lab Results  Component Value Date   CHOL 269 (H) 11/05/2023   HDL 55 11/05/2023   LDLCALC 182 (H) 11/05/2023   LDLDIRECT 150.0 12/31/2021   TRIG 168 (H) 11/05/2023   CHOLHDL 4.9 11/05/2023

## 2023-11-06 NOTE — Assessment & Plan Note (Signed)
 Currently on benazepril and hydrochlorothiazide and amlodipine.  No change today. Follow pressures. Follow metabolic panel.

## 2023-11-06 NOTE — Assessment & Plan Note (Signed)
 Intolerant to statins.  Off repatha.  Did not tolerate zetia. On niacin. Low cholesterol diet and exercise.  Follow lipid panel and liver function tests. Recheck today.  Lab Results  Component Value Date   CHOL 269 (H) 11/05/2023   HDL 55 11/05/2023   LDLCALC 182 (H) 11/05/2023   LDLDIRECT 150.0 12/31/2021   TRIG 168 (H) 11/05/2023   CHOLHDL 4.9 11/05/2023

## 2023-11-06 NOTE — Assessment & Plan Note (Signed)
 Continue prilosec. No upper symptoms reported.

## 2023-11-06 NOTE — Progress Notes (Signed)
Order placed for f/u liver panel.  

## 2023-11-06 NOTE — Assessment & Plan Note (Signed)
Intolerant to statin medication as outlined.  

## 2023-11-06 NOTE — Assessment & Plan Note (Signed)
 Low carb diet and exercise. She is limited regarding her activity due to her knee issues. Discussed. Follow met b and A1c.

## 2023-11-07 ENCOUNTER — Other Ambulatory Visit: Payer: Self-pay | Admitting: Internal Medicine

## 2023-11-19 DIAGNOSIS — H353221 Exudative age-related macular degeneration, left eye, with active choroidal neovascularization: Secondary | ICD-10-CM | POA: Diagnosis not present

## 2023-12-07 ENCOUNTER — Encounter: Payer: Self-pay | Admitting: Internal Medicine

## 2023-12-07 MED ORDER — GLIPIZIDE ER 5 MG PO TB24: 5.0000 mg | ORAL_TABLET | Freq: Every day | ORAL | 1 refills | Status: DC

## 2023-12-07 NOTE — Telephone Encounter (Signed)
 Per lab note, informed pt to increase GlipiZide  to 5mg . New prescription pended for approval

## 2023-12-07 NOTE — Telephone Encounter (Signed)
 Rx for glipizide  CL 5mg  q day sent in to [facility] mail order.

## 2023-12-09 ENCOUNTER — Other Ambulatory Visit

## 2023-12-09 DIAGNOSIS — Z7984 Long term (current) use of oral hypoglycemic drugs: Secondary | ICD-10-CM | POA: Diagnosis not present

## 2023-12-09 DIAGNOSIS — E1165 Type 2 diabetes mellitus with hyperglycemia: Secondary | ICD-10-CM | POA: Diagnosis not present

## 2023-12-09 DIAGNOSIS — R7989 Other specified abnormal findings of blood chemistry: Secondary | ICD-10-CM

## 2023-12-09 DIAGNOSIS — E7800 Pure hypercholesterolemia, unspecified: Secondary | ICD-10-CM

## 2023-12-09 DIAGNOSIS — I10 Essential (primary) hypertension: Secondary | ICD-10-CM

## 2023-12-09 LAB — CBC WITH DIFFERENTIAL/PLATELET
Basophils Absolute: 0.1 10*3/uL (ref 0.0–0.1)
Basophils Relative: 0.9 % (ref 0.0–3.0)
Eosinophils Absolute: 0.1 10*3/uL (ref 0.0–0.7)
Eosinophils Relative: 1.9 % (ref 0.0–5.0)
HCT: 40 % (ref 36.0–46.0)
Hemoglobin: 12.9 g/dL (ref 12.0–15.0)
Lymphocytes Relative: 33.1 % (ref 12.0–46.0)
Lymphs Abs: 2.1 10*3/uL (ref 0.7–4.0)
MCHC: 32.3 g/dL (ref 30.0–36.0)
MCV: 90.6 fl (ref 78.0–100.0)
Monocytes Absolute: 0.4 10*3/uL (ref 0.1–1.0)
Monocytes Relative: 6.9 % (ref 3.0–12.0)
Neutro Abs: 3.7 10*3/uL (ref 1.4–7.7)
Neutrophils Relative %: 57.2 % (ref 43.0–77.0)
Platelets: 298 10*3/uL (ref 150.0–400.0)
RBC: 4.42 Mil/uL (ref 3.87–5.11)
RDW: 14.3 % (ref 11.5–15.5)
WBC: 6.4 10*3/uL (ref 4.0–10.5)

## 2023-12-09 LAB — BASIC METABOLIC PANEL WITH GFR
BUN: 20 mg/dL (ref 6–23)
CO2: 32 meq/L (ref 19–32)
Calcium: 9.1 mg/dL (ref 8.4–10.5)
Chloride: 101 meq/L (ref 96–112)
Creatinine, Ser: 0.95 mg/dL (ref 0.40–1.20)
GFR: 55.37 mL/min — ABNORMAL LOW (ref >60.00)
Glucose, Bld: 141 mg/dL — ABNORMAL HIGH (ref 70–99)
Potassium: 4.7 meq/L (ref 3.5–5.1)
Sodium: 141 meq/L (ref 135–145)

## 2023-12-09 LAB — HEPATIC FUNCTION PANEL
ALT: 20 U/L (ref 0–35)
AST: 18 U/L (ref 0–37)
Albumin: 4.2 g/dL (ref 3.5–5.2)
Alkaline Phosphatase: 65 U/L (ref 39–117)
Bilirubin, Direct: 0.1 mg/dL (ref 0.0–0.3)
Total Bilirubin: 0.4 mg/dL (ref 0.2–1.2)
Total Protein: 6.5 g/dL (ref 6.0–8.3)

## 2023-12-09 LAB — LIPID PANEL
Cholesterol: 248 mg/dL — ABNORMAL HIGH (ref 0–200)
HDL: 52 mg/dL (ref >39.00)
LDL Cholesterol: 163 mg/dL — ABNORMAL HIGH (ref 0–99)
NonHDL: 196.09
Total CHOL/HDL Ratio: 5
Triglycerides: 165 mg/dL — ABNORMAL HIGH (ref 0.0–149.0)
VLDL: 33 mg/dL (ref 0.0–40.0)

## 2023-12-09 LAB — HEMOGLOBIN A1C: Hgb A1c MFr Bld: 7.5 % — ABNORMAL HIGH (ref 4.6–6.5)

## 2023-12-09 LAB — TSH: TSH: 4.22 u[IU]/mL (ref 0.35–5.50)

## 2023-12-13 ENCOUNTER — Ambulatory Visit: Payer: Self-pay | Admitting: Internal Medicine

## 2023-12-13 NOTE — Telephone Encounter (Signed)
-----   Message from [facility] sent at 12/13/2023  6:58 AM EDT ----- Cholesterol remains elevated. She did not tolerate statins or zetia . She previously tried repatha  (injection q 2 weeks). Unclear if she had problems with repatha . See if agreeable to restart repatha  or can try another injectable cholesterol medication - praluent. Overall sugar control improved.  A1c 7.5.  Continue diet and exercise and glipizide .  Kidney function stable. Hgb, thyroid  test and liver function tests are wnl.

## 2023-12-13 NOTE — Telephone Encounter (Signed)
 Left message to call the office back regarding the lab results below. Okay for E2C2  to give the lab results and find out if she is agreeable to the medication.

## 2023-12-14 NOTE — Telephone Encounter (Signed)
 Copied from CRM [id number]. Topic: Clinical - Lab/Test Results >> Dec 14, 2023  9:39 AM Misty Jones wrote: Reason for CRM: Patient called in regarding missed call, relay lab results,patient stated she doesn't not want to start back on  repatha  as she had a very bad reaction last time to it

## 2023-12-17 DIAGNOSIS — H353221 Exudative age-related macular degeneration, left eye, with active choroidal neovascularization: Secondary | ICD-10-CM | POA: Diagnosis not present

## 2023-12-24 ENCOUNTER — Other Ambulatory Visit: Payer: Self-pay | Admitting: Internal Medicine

## 2024-01-03 ENCOUNTER — Other Ambulatory Visit: Payer: Self-pay | Admitting: Internal Medicine

## 2024-01-10 ENCOUNTER — Other Ambulatory Visit: Payer: Self-pay | Admitting: Internal Medicine

## 2024-01-14 ENCOUNTER — Telehealth: Payer: Self-pay

## 2024-01-14 DIAGNOSIS — H353221 Exudative age-related macular degeneration, left eye, with active choroidal neovascularization: Secondary | ICD-10-CM | POA: Diagnosis not present

## 2024-01-14 NOTE — Telephone Encounter (Signed)
 I left voicemail for patient asking her to please call us  to reschedule her 03/10/2024 appointment with Dr. Dellar Fenton, as she will be out of the office.  I also sent a message to patient via MyChart.  E2C2 - when patient calls back, please assist her with rescheduling her 03/10/2024 appointment with Dr. Dellar Fenton.

## 2024-01-25 ENCOUNTER — Other Ambulatory Visit: Payer: Self-pay | Admitting: Internal Medicine

## 2024-01-25 DIAGNOSIS — E1165 Type 2 diabetes mellitus with hyperglycemia: Secondary | ICD-10-CM

## 2024-01-26 ENCOUNTER — Other Ambulatory Visit
Admission: RE | Admit: 2024-01-26 | Discharge: 2024-01-26 | Disposition: A | Discharge status: Home / Self Care | Attending: Cardiology | Admitting: Cardiology

## 2024-01-26 DIAGNOSIS — R9431 Abnormal electrocardiogram [ECG] [EKG]: Secondary | ICD-10-CM | POA: Diagnosis not present

## 2024-01-26 DIAGNOSIS — I10 Essential (primary) hypertension: Secondary | ICD-10-CM | POA: Diagnosis not present

## 2024-01-26 DIAGNOSIS — I361 Nonrheumatic tricuspid (valve) insufficiency: Secondary | ICD-10-CM | POA: Diagnosis not present

## 2024-01-26 DIAGNOSIS — R0789 Other chest pain: Secondary | ICD-10-CM | POA: Diagnosis not present

## 2024-01-26 DIAGNOSIS — I340 Nonrheumatic mitral (valve) insufficiency: Secondary | ICD-10-CM | POA: Diagnosis not present

## 2024-01-26 DIAGNOSIS — E785 Hyperlipidemia, unspecified: Secondary | ICD-10-CM | POA: Diagnosis not present

## 2024-01-26 LAB — TROPONIN I (HIGH SENSITIVITY): Troponin I (High Sensitivity): 6 ng/L (ref <18)

## 2024-02-02 ENCOUNTER — Other Ambulatory Visit: Payer: Self-pay | Admitting: Internal Medicine

## 2024-02-02 NOTE — Telephone Encounter (Signed)
 Please refuse, patient is taking 5 mg.

## 2024-02-08 ENCOUNTER — Encounter: Payer: Self-pay | Admitting: Dermatology

## 2024-02-08 ENCOUNTER — Ambulatory Visit: Admitting: Dermatology

## 2024-02-08 DIAGNOSIS — D692 Other nonthrombocytopenic purpura: Secondary | ICD-10-CM | POA: Diagnosis not present

## 2024-02-08 DIAGNOSIS — B009 Herpesviral infection, unspecified: Secondary | ICD-10-CM

## 2024-02-08 DIAGNOSIS — D1801 Hemangioma of skin and subcutaneous tissue: Secondary | ICD-10-CM | POA: Diagnosis not present

## 2024-02-08 DIAGNOSIS — Z86018 Personal history of other benign neoplasm: Secondary | ICD-10-CM | POA: Diagnosis not present

## 2024-02-08 DIAGNOSIS — L821 Other seborrheic keratosis: Secondary | ICD-10-CM

## 2024-02-08 DIAGNOSIS — W908XXA Exposure to other nonionizing radiation, initial encounter: Secondary | ICD-10-CM | POA: Diagnosis not present

## 2024-02-08 DIAGNOSIS — L578 Other skin changes due to chronic exposure to nonionizing radiation: Secondary | ICD-10-CM

## 2024-02-08 DIAGNOSIS — D229 Melanocytic nevi, unspecified: Secondary | ICD-10-CM

## 2024-02-08 DIAGNOSIS — L814 Other melanin hyperpigmentation: Secondary | ICD-10-CM | POA: Diagnosis not present

## 2024-02-08 DIAGNOSIS — L818 Other specified disorders of pigmentation: Secondary | ICD-10-CM | POA: Diagnosis not present

## 2024-02-08 DIAGNOSIS — L820 Inflamed seborrheic keratosis: Secondary | ICD-10-CM

## 2024-02-08 DIAGNOSIS — Z1283 Encounter for screening for malignant neoplasm of skin: Secondary | ICD-10-CM | POA: Diagnosis not present

## 2024-02-08 NOTE — Progress Notes (Signed)
 Follow-Up Visit   Subjective  Misty Jones is a 83 y.o. female who presents for the following: Skin Cancer Screening and Full Body Skin Exam. Hx of dysplastic nevus. No personal Hx of skin cancer.   Areas of concern on chest. Tender when first popped up. Irritating.  The patient presents for Total-Body Skin Exam (TBSE) for skin cancer screening and mole check. The patient has spots, moles and lesions to be evaluated, some may be new or changing and the patient may have concern these could be cancer.    The following portions of the chart were reviewed this encounter and updated as appropriate: medications, allergies, medical history  Review of Systems:  No other skin or systemic complaints except as noted in HPI or Assessment and Plan.  Objective  Well appearing patient in no apparent distress; mood and affect are within normal limits.  A full examination was performed including scalp, head, eyes, ears, nose, lips, neck, chest, axillae, abdomen, back, buttocks, bilateral upper extremities, bilateral lower extremities, hands, feet, fingers, toes, fingernails, and toenails. All findings within normal limits unless otherwise noted below.   Relevant physical exam findings are noted in the Assessment and Plan.  lower sternum x7, R chest x1 (8) Erythematous keratotic or waxy stuck-on papule or plaque.  Assessment & Plan   SKIN CANCER SCREENING PERFORMED TODAY.  HISTORY OF DYSPLASTIC NEVUS. Left mid back. Moderate atypia. 07/05/2018. No evidence of recurrence today Recommend regular full body skin exams Recommend daily broad spectrum sunscreen SPF 30+ to sun-exposed areas, reapply every 2 hours as needed.  Call if any new or changing lesions are noted between office visits   ACTINIC DAMAGE - Chronic condition, secondary to cumulative UV/sun exposure - diffuse scaly erythematous macules with underlying dyspigmentation - Recommend daily broad spectrum sunscreen SPF 30+ to  sun-exposed areas, reapply every 2 hours as needed.  - Staying in the shade or wearing long sleeves, sun glasses (UVA+UVB protection) and wide brim hats (4-inch brim around the entire circumference of the hat) are also recommended for sun protection.  - Call for new or changing lesions.  LENTIGINES, SEBORRHEIC KERATOSES, HEMANGIOMAS - Benign normal skin lesions - Benign-appearing - Call for any changes  MELANOCYTIC NEVI - Tan-brown and/or pink-flesh-colored symmetric macules and papules - Benign appearing on exam today - Observation - Call clinic for new or changing moles - Recommend daily use of broad spectrum spf 30+ sunscreen to sun-exposed areas.    Purpura - Chronic; persistent and recurrent.  Treatable, but not curable. - Violaceous macules and patches at arms and hands. - Benign - Related to trauma, age, sun damage and/or use of blood thinners, chronic use of topical and/or oral steroids - Observe - Can use OTC arnica containing moisturizer such as Dermend Bruise Formula if desired - Call for worsening or other concerns  STASIS CHANGES Exam: hyperpigmented-violaceous patches at B/L pretibial    Stasis in the legs causes chronic leg swelling, which may result in itchy or painful rashes, skin discoloration, skin texture changes, and sometimes ulceration.  Recommend daily graduated compression hose/stockings- easiest to put on first thing in morning, remove at bedtime.  Elevate legs as much as possible. Avoid salt/sodium rich foods.  Treatment Plan: Benign, observe.  Recommend daily compression hose.  HERPESVIRAL INFECTION Exam Clustered pink macules right inferior buttocks, c/w healing HSV outbreak  Chronic and persistent condition with duration or expected duration over one year. Condition is symptomatic/ bothersome to patient. Not currently at goal.   Herpes  Simplex Virus = Cold Sores = Fever Blisters is a chronic recurring blistering; scabbing sore-producing viral  infection that is recurrent usually in the same area triggered by stress, sun/UV exposure and trauma.  It is infectious and can be spread from person to person by direct contact.  It is not curable, but is treatable with topical and oral medication.  Treatment Plan Recommended  Valacyclovir 2 grams every 12 hours for 2 doses with a glass of water at the first sign of symptoms. Patient deferred treatment at this time, states she will continue topical Campho-phenique.   CHEILITIS Exam: erythema and mild scale at upper lip  Treatment Plan Dr. Horald Corti-balm bid prn flares samples given today.  Recommend using Vaseline or Aquaphor several times daily.    INFLAMED SEBORRHEIC KERATOSIS (8) lower sternum x7, R chest x1 (8) Symptomatic, irritating, patient would like treated. Destruction of lesion - lower sternum x7, R chest x1 (8)  Destruction method: cryotherapy   Informed consent: discussed and consent obtained   Lesion destroyed using liquid nitrogen: Yes   Region frozen until ice ball extended beyond lesion: Yes   Outcome: patient tolerated procedure well with no complications   Post-procedure details: wound care instructions given   Additional details:  Prior to procedure, discussed risks of blister formation, small wound, skin dyspigmentation, or rare scar following cryotherapy. Recommend Vaseline ointment to treated areas while healing.   Return in about 1 year (around 02/07/2025) for TBSE, HxDN.  I, Jill Parcell, CMA, am acting as scribe for Rexene Rattler, MD.   Documentation: I have reviewed the above documentation for accuracy and completeness, and I agree with the above.  Rexene Rattler, MD

## 2024-02-08 NOTE — Patient Instructions (Signed)
Cryotherapy Aftercare  Wash gently with soap and water everyday.   Apply Vaseline Jelly daily until healed.    Recommend daily broad spectrum sunscreen SPF 30+ to sun-exposed areas, reapply every 2 hours as needed. Call for new or changing lesions.  Staying in the shade or wearing long sleeves, sun glasses (UVA+UVB protection) and wide brim hats (4-inch brim around the entire circumference of the hat) are also recommended for sun protection.     Seborrheic Keratosis  What causes seborrheic keratoses? Seborrheic keratoses are harmless, common skin growths that first appear during adult life.  As time goes by, more growths appear.  Some people may develop a large number of them.  Seborrheic keratoses appear on both covered and uncovered body parts.  They are not caused by sunlight.  The tendency to develop seborrheic keratoses can be inherited.  They vary in color from skin-colored to gray, brown, or even black.  They can be either smooth or have a rough, warty surface.   Seborrheic keratoses are superficial and look as if they were stuck on the skin.  Under the microscope this type of keratosis looks like layers upon layers of skin.  That is why at times the top layer may seem to fall off, but the rest of the growth remains and re-grows.    Treatment Seborrheic keratoses do not need to be treated, but can easily be removed in the office.  Seborrheic keratoses often cause symptoms when they rub on clothing or jewelry.  Lesions can be in the way of shaving.  If they become inflamed, they can cause itching, soreness, or burning.  Removal of a seborrheic keratosis can be accomplished by freezing, burning, or surgery. If any spot bleeds, scabs, or grows rapidly, please return to have it checked, as these can be an indication of a skin cancer.     Melanoma ABCDEs  Melanoma is the most dangerous type of skin cancer, and is the leading cause of death from skin disease.  You are more likely to  develop melanoma if you: Have light-colored skin, light-colored eyes, or red or blond hair Spend a lot of time in the sun Tan regularly, either outdoors or in a tanning bed Have had blistering sunburns, especially during childhood Have a close family member who has had a melanoma Have atypical moles or large birthmarks  Early detection of melanoma is key since treatment is typically straightforward and cure rates are extremely high if we catch it early.   The first sign of melanoma is often a change in a mole or a new dark spot.  The ABCDE system is a way of remembering the signs of melanoma.  A for asymmetry:  The two halves do not match. B for border:  The edges of the growth are irregular. C for color:  A mixture of colors are present instead of an even brown color. D for diameter:  Melanomas are usually (but not always) greater than 6mm - the size of a pencil eraser. E for evolution:  The spot keeps changing in size, shape, and color.  Please check your skin once per month between visits. You can use a small mirror in front and a large mirror behind you to keep an eye on the back side or your body.   If you see any new or changing lesions before your next follow-up, please call to schedule a visit.  Please continue daily skin protection including broad spectrum sunscreen SPF 30+ to sun-exposed areas, reapplying  every 2 hours as needed when you're outdoors.   Staying in the shade or wearing long sleeves, sun glasses (UVA+UVB protection) and wide brim hats (4-inch brim around the entire circumference of the hat) are also recommended for sun protection.     Due to recent changes in healthcare laws, you may see results of your pathology and/or laboratory studies on MyChart before the doctors have had a chance to review them. We understand that in some cases there may be results that are confusing or concerning to you. Please understand that not all results are received at the same time and  often the doctors may need to interpret multiple results in order to provide you with the best plan of care or course of treatment. Therefore, we ask that you please give [place] 2 business days to thoroughly review all your results before contacting the office for clarification. Should we see a critical lab result, you will be contacted sooner.   If You Need Anything After Your Visit  If you have any questions or concerns for your doctor, please call our main line at [phone] and press option 4 to reach your doctor's medical assistant. If no one answers, please leave a voicemail as directed and we will return your call as soon as possible. Messages left after 4 pm will be answered the following business day.   You may also send [place] a message via MyChart. We typically respond to MyChart messages within 1-2 business days.  For prescription refills, please ask your pharmacy to contact our office. Our fax number is [phone].  If you have an urgent issue when the clinic is closed that cannot wait until the next business day, you can page your doctor at the number below.    Please note that while we do our best to be available for urgent issues outside of office hours, we are not available 24/7.   If you have an urgent issue and are unable to reach [place], you may choose to seek medical care at your doctor's office, retail clinic, urgent care center, or emergency room.  If you have a medical emergency, please immediately call 911 or go to the emergency department.  Pager Numbers  - Dr. Gwen Pounds: [phone]  - Dr. Roseanne Reno: [phone]  - Dr. Katrinka Blazing: [phone]   In the event of inclement weather, please call our main line at [phone] for an update on the status of any delays or closures.  Dermatology Medication Tips: Please keep the boxes that topical medications come in in order to help keep track of the instructions about where and how to use these. Pharmacies typically print the  medication instructions only on the boxes and not directly on the medication tubes.   If your medication is too expensive, please contact our office at [phone] option 4 or send [place] a message through MyChart.   We are unable to tell what your co-pay for medications will be in advance as this is different depending on your insurance coverage. However, we may be able to find a substitute medication at lower cost or fill out paperwork to get insurance to cover a needed medication.   If a prior authorization is required to get your medication covered by your insurance company, please allow [place] 1-2 business days to complete this process.  Drug prices often vary depending on where the prescription is filled and some pharmacies may offer cheaper prices.  The website www.goodrx.com contains coupons for medications through different pharmacies. The prices here  do not account for what the cost may be with help from insurance (it may be cheaper with your insurance), but the website can give you the price if you did not use any insurance.  - You can print the associated coupon and take it with your prescription to the pharmacy.  - You may also stop by our office during regular business hours and pick up a GoodRx coupon card.  - If you need your prescription sent electronically to a different pharmacy, notify our office through [facility] or by phone at [phone] option 4.     Si Usted Necesita Algo Despus de Su Visita  Tambin puede enviarnos un mensaje a travs de Clinical cytogeneticist. Por lo general respondemos a los mensajes de MyChart en el transcurso de 1 a 2 das hbiles.  Para renovar recetas, por favor pida a su farmacia que se ponga en contacto con nuestra oficina. Annie Sable de fax es [place] [phone].  Si tiene un asunto urgente cuando la clnica est cerrada y que no puede esperar hasta el siguiente da hbil, puede llamar/localizar a su doctor(a) al nmero que aparece a continuacin.    Por favor, tenga en cuenta que aunque hacemos todo lo posible para estar disponibles para asuntos urgentes fuera del horario de [place], no estamos disponibles las 24 horas del da, los 7 [address]  [address] de la [place].   Si tiene un problema urgente y no puede comunicarse con nosotros, puede optar por buscar atencin mdica  en el consultorio de su doctor(a), en una clnica privada, en un centro de atencin urgente o en una sala de emergencias.  Si tiene Engineer, drilling, por favor llame inmediatamente al 911 o vaya a la sala de emergencias.  Nmeros de bper  - Dr. Gwen Pounds: [phone]  - Dra. Roseanne Reno: [phone]  - Dr. Katrinka Blazing: [phone]   En caso de inclemencias del tiempo, por favor llame a Lacy Duverney principal al [phone] para una actualizacin sobre el [place] de cualquier retraso o cierre.  Consejos para la medicacin en dermatologa: Por favor, guarde las cajas en las que vienen los medicamentos de uso tpico para ayudarle a seguir las instrucciones sobre dnde y cmo usarlos. Las farmacias generalmente imprimen las instrucciones del medicamento slo en las cajas y no directamente en los tubos del [place].   Si su medicamento es muy caro, por favor, pngase en contacto con Rolm Gala llamando al [phone] y presione la opcin 4 o envenos un mensaje a travs de Clinical cytogeneticist.   No podemos decirle cul ser su copago por los medicamentos por adelantado ya que esto es diferente dependiendo de la cobertura de su seguro. Sin embargo, es posible que podamos encontrar un medicamento sustituto a Audiological scientist un formulario para que el seguro cubra el medicamento que se considera necesario.   Si se requiere una autorizacin previa para que su compaa de seguros [place] su medicamento, por favor permtanos de 1 a 2 das hbiles para completar [address]  Los precios de los medicamentos varan con frecuencia dependiendo del Environmental consultant de dnde se surte la receta y alguna  farmacias pueden ofrecer precios ms baratos.  El sitio web www.goodrx.com tiene cupones para medicamentos de Health and safety inspector. Los precios aqu no tienen en cuenta lo que podra costar con la ayuda del seguro (puede ser ms barato con su seguro), pero el sitio web puede darle el precio si no utiliz Tourist information centre manager.  - Puede imprimir el cupn correspondiente y llevarlo con su receta a la  farmacia.  - Tambin puede pasar por nuestra oficina durante el horario de atencin regular y Education officer, museum una tarjeta de cupones de GoodRx.  - Si necesita que su receta se enve electrnicamente a una farmacia diferente, informe a nuestra oficina a travs de MyChart de [place] o por telfono llamando al [phone] y presione la opcin 4.

## 2024-02-11 DIAGNOSIS — H353221 Exudative age-related macular degeneration, left eye, with active choroidal neovascularization: Secondary | ICD-10-CM | POA: Diagnosis not present

## 2024-02-14 DIAGNOSIS — M170 Bilateral primary osteoarthritis of knee: Secondary | ICD-10-CM | POA: Diagnosis not present

## 2024-02-15 ENCOUNTER — Telehealth: Payer: Self-pay

## 2024-02-15 NOTE — Telephone Encounter (Signed)
 Knee injection yesterday Last night 238 Today 208  She woke up with a headache this morning but was resolved with OTC medication (not abnormal for her so she did not relate to her sugars).  She is wanting to know if she should adjust her diabetes medication or just continue to monitor? She is currently taking glipizide  5 mg q day.

## 2024-02-15 NOTE — Telephone Encounter (Signed)
 The sugars will increase for a short time after steroid injection. Would hold on increasing her oral medication. Stay hydrated. Follow sugars. Let us  know if any problems. Per review, headache has resolved.

## 2024-02-15 NOTE — Telephone Encounter (Signed)
 Patient aware of below.

## 2024-02-15 NOTE — Telephone Encounter (Signed)
 Copied from CRM [id number]. Topic: Clinical - Medical Advice >> Feb 15, 2024  8:36 AM Burnard DEL wrote: Reason for CRM: Patient had knee injections on yesterday that has caused her blood sugar to increase some. Her blood sugar is running 208. No symptoms beside a headache that she usually has.She would like to know should she increase her medication for a day or two?

## 2024-03-06 ENCOUNTER — Encounter: Payer: Self-pay | Admitting: Internal Medicine

## 2024-03-06 ENCOUNTER — Other Ambulatory Visit: Payer: Self-pay | Admitting: Internal Medicine

## 2024-03-06 DIAGNOSIS — E1165 Type 2 diabetes mellitus with hyperglycemia: Secondary | ICD-10-CM

## 2024-03-06 MED ORDER — CONTOUR TEST VI STRP: ORAL_STRIP | 3 refills | Status: AC

## 2024-03-06 MED ORDER — CYANOCOBALAMIN 1000 MCG/ML IJ SOLN: 1000.0000 ug | INTRAMUSCULAR | 0 refills | Status: AC

## 2024-03-10 ENCOUNTER — Ambulatory Visit: Admitting: Internal Medicine

## 2024-03-17 DIAGNOSIS — H353221 Exudative age-related macular degeneration, left eye, with active choroidal neovascularization: Secondary | ICD-10-CM | POA: Diagnosis not present

## 2024-03-31 ENCOUNTER — Other Ambulatory Visit: Payer: Self-pay | Admitting: Internal Medicine

## 2024-04-06 ENCOUNTER — Ambulatory Visit (INDEPENDENT_AMBULATORY_CARE_PROVIDER_SITE_OTHER): Admitting: Internal Medicine

## 2024-04-06 ENCOUNTER — Ambulatory Visit: Payer: Self-pay | Admitting: Internal Medicine

## 2024-04-06 ENCOUNTER — Encounter: Payer: Self-pay | Admitting: Internal Medicine

## 2024-04-06 VITALS — BP 122/70 | HR 86 | Resp 16 | Ht 64.0 in | Wt 207.2 lb

## 2024-04-06 DIAGNOSIS — I10 Essential (primary) hypertension: Secondary | ICD-10-CM

## 2024-04-06 DIAGNOSIS — T466X5A Adverse effect of antihyperlipidemic and antiarteriosclerotic drugs, initial encounter: Secondary | ICD-10-CM | POA: Diagnosis not present

## 2024-04-06 DIAGNOSIS — K219 Gastro-esophageal reflux disease without esophagitis: Secondary | ICD-10-CM | POA: Diagnosis not present

## 2024-04-06 DIAGNOSIS — R053 Chronic cough: Secondary | ICD-10-CM

## 2024-04-06 DIAGNOSIS — Z860100 Personal history of colon polyps, unspecified: Secondary | ICD-10-CM

## 2024-04-06 DIAGNOSIS — M25561 Pain in right knee: Secondary | ICD-10-CM | POA: Diagnosis not present

## 2024-04-06 DIAGNOSIS — Z23 Encounter for immunization: Secondary | ICD-10-CM | POA: Diagnosis not present

## 2024-04-06 DIAGNOSIS — G720 Drug-induced myopathy: Secondary | ICD-10-CM

## 2024-04-06 DIAGNOSIS — E7800 Pure hypercholesterolemia, unspecified: Secondary | ICD-10-CM | POA: Diagnosis not present

## 2024-04-06 DIAGNOSIS — I700 Atherosclerosis of aorta: Secondary | ICD-10-CM

## 2024-04-06 DIAGNOSIS — E1165 Type 2 diabetes mellitus with hyperglycemia: Secondary | ICD-10-CM | POA: Diagnosis not present

## 2024-04-06 DIAGNOSIS — M25562 Pain in left knee: Secondary | ICD-10-CM

## 2024-04-06 LAB — HEPATIC FUNCTION PANEL
ALT: 15 U/L (ref 0–35)
AST: 13 U/L (ref 0–37)
Albumin: 4.1 g/dL (ref 3.5–5.2)
Alkaline Phosphatase: 75 U/L (ref 39–117)
Bilirubin, Direct: 0.1 mg/dL (ref 0.0–0.3)
Total Bilirubin: 0.5 mg/dL (ref 0.2–1.2)
Total Protein: 6.1 g/dL (ref 6.0–8.3)

## 2024-04-06 LAB — CBC WITH DIFFERENTIAL/PLATELET
Basophils Absolute: 0.1 K/uL (ref 0.0–0.1)
Basophils Relative: 1 % (ref 0.0–3.0)
Eosinophils Absolute: 0.2 K/uL (ref 0.0–0.7)
Eosinophils Relative: 2.4 % (ref 0.0–5.0)
HCT: 40.1 % (ref 36.0–46.0)
Hemoglobin: 12.9 g/dL (ref 12.0–15.0)
Lymphocytes Relative: 26.8 % (ref 12.0–46.0)
Lymphs Abs: 1.8 K/uL (ref 0.7–4.0)
MCHC: 32.3 g/dL (ref 30.0–36.0)
MCV: 92.2 fl (ref 78.0–100.0)
Monocytes Absolute: 0.4 K/uL (ref 0.1–1.0)
Monocytes Relative: 6.6 % (ref 3.0–12.0)
Neutro Abs: 4.2 K/uL (ref 1.4–7.7)
Neutrophils Relative %: 63.2 % (ref 43.0–77.0)
Platelets: 299 K/uL (ref 150.0–400.0)
RBC: 4.34 Mil/uL (ref 3.87–5.11)
RDW: 14.4 % (ref 11.5–15.5)
WBC: 6.6 K/uL (ref 4.0–10.5)

## 2024-04-06 LAB — BASIC METABOLIC PANEL WITH GFR
BUN: 16 mg/dL (ref 6–23)
CO2: 32 meq/L (ref 19–32)
Calcium: 9.2 mg/dL (ref 8.4–10.5)
Chloride: 99 meq/L (ref 96–112)
Creatinine, Ser: 0.86 mg/dL (ref 0.40–1.20)
GFR: 62.25 mL/min (ref >60.00)
Glucose, Bld: 139 mg/dL — ABNORMAL HIGH (ref 70–99)
Potassium: 4.4 meq/L (ref 3.5–5.1)
Sodium: 140 meq/L (ref 135–145)

## 2024-04-06 LAB — MICROALBUMIN / CREATININE URINE RATIO
Creatinine,U: 40.8 mg/dL
Microalb Creat Ratio: UNDETERMINED mg/g (ref 0.0–30.0)
Microalb, Ur: <0.7 mg/dL

## 2024-04-06 LAB — LIPID PANEL
Cholesterol: 279 mg/dL — ABNORMAL HIGH (ref 0–200)
HDL: 54.2 mg/dL (ref >39.00)
LDL Cholesterol: 188 mg/dL — ABNORMAL HIGH (ref 0–99)
NonHDL: 224.31
Total CHOL/HDL Ratio: 5
Triglycerides: 183 mg/dL — ABNORMAL HIGH (ref 0.0–149.0)
VLDL: 36.6 mg/dL (ref 0.0–40.0)

## 2024-04-06 LAB — HEMOGLOBIN A1C: Hgb A1c MFr Bld: 7.4 % — ABNORMAL HIGH (ref 4.6–6.5)

## 2024-04-06 MED ORDER — LANCETS MISC: 3 refills | Status: AC

## 2024-04-06 MED ORDER — BUDESONIDE-FORMOTEROL FUMARATE 80-4.5 MCG/ACT IN AERO: 2.0000 | INHALATION_SPRAY | Freq: Two times a day (BID) | RESPIRATORY_TRACT | 12 refills | Status: DC

## 2024-04-06 MED ORDER — GLIPIZIDE ER 5 MG PO TB24: 5.0000 mg | ORAL_TABLET | Freq: Every day | ORAL | 1 refills | Status: DC

## 2024-04-06 MED ORDER — HYDROCHLOROTHIAZIDE 25 MG PO TABS: ORAL_TABLET | ORAL | 0 refills | Status: DC

## 2024-04-06 NOTE — Assessment & Plan Note (Signed)
 Continue prilosec. No upper symptoms reported.

## 2024-04-06 NOTE — Assessment & Plan Note (Signed)
 Colonoscopy 12.2017 - one polyp in ascending colon and two rectal polyps (tubular adenomas).  recommended f/u in 5 years.  Discussed colonoscopy, age. Saw GI. Recommended no follow up colonoscopy.

## 2024-04-06 NOTE — Assessment & Plan Note (Signed)
Intolerant to statin medication.

## 2024-04-06 NOTE — Assessment & Plan Note (Signed)
 Currently on benazepril  and hydrochlorothiazide  and amlodipine .  No change today. Follow pressures. Check metabolic panel today.

## 2024-04-06 NOTE — Assessment & Plan Note (Signed)
 Intolerant to statins.  Off repatha .  Intolerant to zetia  as well. On niacin. Low cholesterol diet and exercise.  Follow lipid panel and liver function tests. Recheck today.  Lab Results  Component Value Date   CHOL 248 (H) 12/09/2023   HDL 52.00 12/09/2023   LDLCALC 163 (H) 12/09/2023   LDLDIRECT 150.0 12/31/2021   TRIG 165.0 (H) 12/09/2023   CHOLHDL 5 12/09/2023

## 2024-04-06 NOTE — Assessment & Plan Note (Signed)
 Dr Edie - 01/2024 - bilateral knee pain. S/p left and right knee injections. Did not tolerate steroid injection. They discussed gel injections. Continues to f/u with ortho.

## 2024-04-06 NOTE — Progress Notes (Signed)
 Subjective:    Patient ID: Misty Jones, female    DOB: 1940-09-11, 83 y.o.   MRN: [id number]  Patient here for  Chief Complaint  Patient presents with   Medical Management of Chronic Issues    HPI Here for a scheduled follow up - follow up regarding diabetes, hypercholesterolemia and hypertension. Has seen ortho for bilateral knee pain - 09/2023. S/p injection. States helped for a few weeks. They discussed possible gel injections. Had reaction to steroid injection - face flushed and headache. Desires not to get another steroid injections. Had f/u with GI 11/02/23 - did not feel colonoscopy warranted. Recommended to continue omeprazole . No bowel issues reported. Breathing stable. She is following with ophthalmology regarding her eyes. Receiving injection q month. Limits her activity.    Past Medical History:  Diagnosis Date   B12 deficiency    Chicken pox    Diabetes mellitus (HCC)    GERD (gastroesophageal reflux disease)    History of diverticulitis of colon    History of dysplastic nevus 07/05/2018   left mid back shv - with moderate atypia   Hypercholesterolemia    Hypertension    Impacted cerumen of left ear 07/01/2022   Nasal drainage    Non-recurrent acute suppurative otitis media of right ear without spontaneous rupture of tympanic membrane 06/16/2022   Past Surgical History:  Procedure Laterality Date   ABDOMINAL HYSTERECTOMY  1984   BACK SURGERY  1986   ruptured disc, Dr [facility])   CHOLECYSTECTOMY  1991   CHOLECYSTECTOMY  1992   COLONOSCOPY WITH PROPOFOL  N/A 06/29/2016   Procedure: COLONOSCOPY WITH PROPOFOL ;  Surgeon: Lamar ONEIDA Holmes, MD;  Location: [facility] ENDOSCOPY;  Service: Endoscopy;  Laterality: N/A;   ESOPHAGOGASTRODUODENOSCOPY (EGD) WITH PROPOFOL  N/A 12/13/2017   Procedure: ESOPHAGOGASTRODUODENOSCOPY (EGD) WITH PROPOFOL ;  Surgeon: Holmes Lamar ONEIDA, MD;  Location: [facility] ENDOSCOPY;  Service: Endoscopy;  Laterality: N/A;   TONSILLECTOMY     Family  History  Problem Relation Age of Onset   Hypertension Mother    Cancer Mother        Mouth cancer   Heart disease Father        myocardial infarction   Raynaud syndrome Sister    Asthma Sister    Congenital heart disease Sister    Thyroid  disease Sister    Thyroid  cancer Daughter    Breast cancer Neg Hx    Colon cancer Neg Hx    Social History   Socioeconomic History   Marital status: Divorced    Spouse name: Not on file   Number of children: 2   Years of education: Not on file   Highest education level: Not on file  Occupational History   Occupation: retired  Tobacco Use   Smoking status: Former    Current packs/day: 0.00    Average packs/day: 0.5 packs/day for 20.0 years (10.0 ttl pk-yrs)    Types: Cigarettes    Start date: 07/26/1964    Quit date: 07/26/1984    Years since quitting: 39.7   Smokeless tobacco: Never   Tobacco comments:    quit smoking years ago  Vaping Use   Vaping status: Never Used  Substance and Sexual Activity   Alcohol use: No    Alcohol/week: 0.0 standard drinks of alcohol   Drug use: No   Sexual activity: Never  Other Topics Concern   Not on file  Social History Narrative   divorced   Social Drivers of Corporate investment banker  Strain: Low Risk  (11/02/2023)   Received from [facility]   Overall Financial Resource Strain (CARDIA)    Difficulty of Paying Living Expenses: Not hard at all  Food Insecurity: No Food Insecurity (11/02/2023)   Received [facility]   Hunger Vital Sign    Within the past 12 months, you worried that your food would run out before you got the money to buy more.: Never true    Within the past 12 months, the food you bought just didn't last and you didn't have money to get more.: Never true  Transportation Needs: No Transportation Needs (11/02/2023)   Received from [facility] - Transportation    In the past 12 months, has lack of transportation  kept you from medical appointments or from getting medications?: No    Lack of Transportation (Non-Medical): No  Physical Activity: Inactive (05/11/2023)   Exercise Vital Sign    Days of Exercise per Week: 0 days    Minutes of Exercise per Session: 0 min  Stress: No Stress Concern Present (05/11/2023)   [employer] of Occupational Health - Occupational Stress Questionnaire    Feeling of Stress : Not at all  Social Connections: Moderately Isolated (05/11/2023)   Social Connection and Isolation Panel    Frequency of Communication with Friends and Family: More than three times a week    Frequency of Social Gatherings with Friends and Family: Once a week    Attends Religious Services: More than 4 times per year    Active Member of [employer] or Organizations: No    Attends Banker Meetings: Never    Marital Status: Divorced     Review of Systems  Constitutional:  Negative for appetite change and unexpected weight change.  HENT:  Negative for congestion and sinus pressure.   Respiratory:  Negative for cough, chest tightness and shortness of breath.   Cardiovascular:  Negative for chest pain, palpitations and leg swelling.  Gastrointestinal:  Negative for abdominal pain, diarrhea, nausea and vomiting.  Genitourinary:  Negative for difficulty urinating and dysuria.  Musculoskeletal:  Negative for joint swelling and myalgias.       Knee pain as outlined - Left > right.   Skin:  Negative for color change and rash.  Neurological:  Negative for dizziness and headaches.  Psychiatric/Behavioral:  Negative for agitation and dysphoric mood.        Objective:     BP 122/70   Pulse 86   Resp 16   Ht 5' 4 (1.626 m)   Wt 207 lb 3.2 oz (94 kg)   SpO2 99%   BMI 35.57 kg/m  Wt Readings from Last 3 Encounters:  04/06/24 207 lb 3.2 oz (94 kg)  11/05/23 218 lb (98.9 kg)  07/30/23 215 lb (97.5 kg)    Physical Exam Vitals reviewed.  Constitutional:      General: She is not  in acute distress.    Appearance: Normal appearance.  HENT:     Head: Normocephalic and atraumatic.     Right Ear: External ear normal.     Left Ear: External ear normal.     Mouth/Throat:     Pharynx: No oropharyngeal exudate or posterior oropharyngeal erythema.  Eyes:     General: No scleral icterus.       Right eye: No discharge.        Left eye: No discharge.     Conjunctiva/sclera: Conjunctivae normal.  Neck:     Thyroid : No thyromegaly.  Cardiovascular:     Rate and Rhythm: Normal rate and regular rhythm.  Pulmonary:     Effort: No respiratory distress.     Breath sounds: Normal breath sounds. No wheezing.  Abdominal:     General: Bowel sounds are normal.     Palpations: Abdomen is soft.     Tenderness: There is no abdominal tenderness.  Musculoskeletal:        General: No swelling or tenderness.     Cervical back: Neck supple. No tenderness.  Lymphadenopathy:     Cervical: No cervical adenopathy.  Skin:    Findings: No erythema or rash.  Neurological:     Mental Status: She is alert.  Psychiatric:        Mood and Affect: Mood normal.        Behavior: Behavior normal.         Outpatient Encounter Medications as of 04/06/2024  Medication Sig   albuterol  (VENTOLIN  HFA) 108 (90 Base) MCG/ACT inhaler Inhale 2 puffs into the lungs every 6 (six) hours as needed for wheezing or shortness of breath.   amLODipine  (NORVASC ) 5 MG tablet Take 1 tablet by mouth once daily   aspirin  81 MG tablet Take 81 mg by mouth daily.   azelastine (ASTELIN) 0.1 % nasal spray Place into the nose.   benazepril  (LOTENSIN ) 40 MG tablet Take 1 tablet by mouth once daily   budesonide -formoterol  (SYMBICORT ) 80-4.5 MCG/ACT inhaler Inhale 2 puffs into the lungs in the morning and at bedtime.   cetirizine (ZYRTEC) 10 MG tablet Take 10 mg by mouth daily.   Cholecalciferol (VITAMIN D -3) 125 MCG (5000 UT) TABS Take 1 tablet by mouth daily.   cyanocobalamin  (VITAMIN B12) 1000 MCG/ML injection Inject  1 mL (1,000 mcg total) into the muscle every 30 (thirty) days.   fluticasone  (FLONASE ) 50 MCG/ACT nasal spray Place 1 spray into both nostrils daily.   glipiZIDE  (GLUCOTROL  XL) 5 MG 24 hr tablet Take 1 tablet (5 mg total) by mouth daily with breakfast.   glucose blood (CONTOUR TEST) test strip USE 1 STRIP TO CHECK GLUCOSE ONCE DAILY   hydrochlorothiazide  (HYDRODIURIL ) 25 MG tablet Take 1/2 (one-half) tablet by mouth once daily   Lancets MISC Check sugars once a day Dx: E11.9 (Contour Next)   Multiple Vitamins-Minerals (ICAPS AREDS 2 PO) Take 1 tablet by mouth in the morning and at bedtime.   niacin 500 MG tablet Take 500 mg by mouth at bedtime.   omeprazole  (PRILOSEC ) 20 MG capsule Take 20 mg by mouth daily.   Syringe/Needle, Disp, (SYRINGE 3CC/25GX1) 25G X 1 3 ML MISC Use as directed to administer b12 injections.   [DISCONTINUED] budesonide -formoterol  (SYMBICORT ) 80-4.5 MCG/ACT inhaler Inhale 2 puffs into the lungs in the morning and at bedtime.   [DISCONTINUED] glipiZIDE  (GLUCOTROL  XL) 5 MG 24 hr tablet Take 1 tablet (5 mg total) by mouth daily with breakfast.   [DISCONTINUED] hydrochlorothiazide  (HYDRODIURIL ) 25 MG tablet Take 1/2 (one-half) tablet by mouth once daily   [DISCONTINUED] Lancets MISC Check sugars once a day Dx: E11.9 (Contour Next)   No facility-administered encounter medications on file as of 04/06/2024.     Lab Results  Component Value Date   WBC 6.4 12/09/2023   HGB 12.9 12/09/2023   HCT 40.0 12/09/2023   PLT 298.0 12/09/2023   GLUCOSE 141 (H) 12/09/2023   CHOL 248 (H) 12/09/2023   TRIG 165.0 (H) 12/09/2023   HDL 52.00 12/09/2023  LDLDIRECT 150.0 12/31/2021   LDLCALC 163 (H) 12/09/2023   ALT 20 12/09/2023   AST 18 12/09/2023   NA 141 12/09/2023   K 4.7 12/09/2023   CL 101 12/09/2023   CREATININE 0.95 12/09/2023   BUN 20 12/09/2023   CO2 32 12/09/2023   TSH 4.22 12/09/2023   INR 0.9 04/03/2012   HGBA1C 7.5 (H) 12/09/2023    No results found.      Assessment & Plan:  Immunization due -     Flu vaccine HIGH DOSE PF(Fluzone Trivalent)  Primary hypertension Assessment & Plan: Currently on benazepril  and hydrochlorothiazide  and amlodipine .  No change today. Follow pressures. Check metabolic panel today.   Orders: -     Basic metabolic panel with GFR  Type 2 diabetes mellitus with hyperglycemia, without long-term current use of insulin (HCC) Assessment & Plan: Low carb diet and exercise. Reviewed outside sugars.  Attached. Doing well. Discussed. Follow met b and A1c.   Orders: -     Basic metabolic panel with GFR -     Hepatic function panel -     Hemoglobin A1c -     Microalbumin / creatinine urine ratio  Hypercholesterolemia Assessment & Plan: Intolerant to statins.  Off repatha .  Did not tolerate zetia . On niacin. Low cholesterol diet and exercise.  Follow lipid panel and liver function tests.  Check lipid panel today.  Lab Results  Component Value Date   CHOL 248 (H) 12/09/2023   HDL 52.00 12/09/2023   LDLCALC 163 (H) 12/09/2023   LDLDIRECT 150.0 12/31/2021   TRIG 165.0 (H) 12/09/2023   CHOLHDL 5 12/09/2023     Orders: -     Hepatic function panel -     Lipid panel -     CBC with Differential/Platelet  Chronic cough -     Budesonide -Formoterol  Fumarate; Inhale 2 puffs into the lungs in the morning and at bedtime.  Dispense: 10.2 g; Refill: 12  Statin myopathy Assessment & Plan: Intolerant to statin medication.    Pain in both knees, unspecified chronicity Assessment & Plan: Dr Edie - 01/2024 - bilateral knee pain. S/p left and right knee injections. Did not tolerate steroid injection. They discussed gel injections. Continues to f/u with ortho.    History of colonic polyps Assessment & Plan: Colonoscopy 12.2017 - one polyp in ascending colon and two rectal polyps (tubular adenomas).  recommended f/u in 5 years.  Discussed colonoscopy, age. Saw GI. Recommended no follow up colonoscopy.     Gastroesophageal reflux disease, unspecified whether esophagitis present Assessment & Plan: Continue prilosec . No upper symptoms reported.    Aortic atherosclerosis (HCC) Assessment & Plan: Intolerant to statins.  Off repatha .  Intolerant to zetia  as well. On niacin. Low cholesterol diet and exercise.  Follow lipid panel and liver function tests. Recheck today.  Lab Results  Component Value Date   CHOL 248 (H) 12/09/2023   HDL 52.00 12/09/2023   LDLCALC 163 (H) 12/09/2023   LDLDIRECT 150.0 12/31/2021   TRIG 165.0 (H) 12/09/2023   CHOLHDL 5 12/09/2023     Other orders -     glipiZIDE  ER; Take 1 tablet (5 mg total) by mouth daily with breakfast.  Dispense: 90 tablet; Refill: 1 -     hydroCHLOROthiazide ; Take 1/2 (one-half) tablet by mouth once daily  Dispense: 90 tablet; Refill: 0 -     Lancets; Check sugars once a day Dx: E11.9 (Contour Next)  Dispense: 100 each; Refill: 3     Allena Hamilton,  MD

## 2024-04-06 NOTE — Assessment & Plan Note (Signed)
 Low carb diet and exercise. Reviewed outside sugars.  Attached. Doing well. Discussed. Follow met b and A1c.

## 2024-04-06 NOTE — Assessment & Plan Note (Signed)
 Intolerant to statins.  Off repatha .  Did not tolerate zetia . On niacin. Low cholesterol diet and exercise.  Follow lipid panel and liver function tests.  Check lipid panel today.  Lab Results  Component Value Date   CHOL 248 (H) 12/09/2023   HDL 52.00 12/09/2023   LDLCALC 163 (H) 12/09/2023   LDLDIRECT 150.0 12/31/2021   TRIG 165.0 (H) 12/09/2023   CHOLHDL 5 12/09/2023

## 2024-04-20 DIAGNOSIS — H903 Sensorineural hearing loss, bilateral: Secondary | ICD-10-CM | POA: Diagnosis not present

## 2024-04-20 DIAGNOSIS — H6983 Other specified disorders of Eustachian tube, bilateral: Secondary | ICD-10-CM | POA: Diagnosis not present

## 2024-04-20 DIAGNOSIS — H6123 Impacted cerumen, bilateral: Secondary | ICD-10-CM | POA: Diagnosis not present

## 2024-04-28 DIAGNOSIS — H353221 Exudative age-related macular degeneration, left eye, with active choroidal neovascularization: Secondary | ICD-10-CM | POA: Diagnosis not present

## 2024-05-03 ENCOUNTER — Other Ambulatory Visit: Payer: Self-pay | Admitting: Internal Medicine

## 2024-05-04 ENCOUNTER — Other Ambulatory Visit: Payer: Self-pay | Admitting: Internal Medicine

## 2024-05-11 ENCOUNTER — Encounter: Payer: Self-pay | Admitting: Internal Medicine

## 2024-05-11 DIAGNOSIS — H353210 Exudative age-related macular degeneration, right eye, stage unspecified: Secondary | ICD-10-CM | POA: Diagnosis not present

## 2024-05-11 DIAGNOSIS — E119 Type 2 diabetes mellitus without complications: Secondary | ICD-10-CM | POA: Diagnosis not present

## 2024-05-23 ENCOUNTER — Other Ambulatory Visit: Payer: Self-pay | Admitting: Internal Medicine

## 2024-05-29 NOTE — Telephone Encounter (Signed)
 Spoke to pt. Changed appt to in person. Pt informed that if earache becomes worse or develops fever to go to urgent care today pt verbalized understanding

## 2024-05-29 NOTE — Telephone Encounter (Signed)
 Copied from CRM [id number]. Topic: General - Call Back - No Documentation >> May 29, 2024  9:13 AM Olam RAMAN wrote: Reason for CRM: Pt would like a cb to be seen today for R earache and sore throat about 2 weeks ago she had now has an earache. [phone] (M)

## 2024-05-29 NOTE — Telephone Encounter (Signed)
 In reviewing, she had sore throat 10/16. Now with persistent earache, needs to be see in person to evaluate. Please see if she can come in to the office.

## 2024-05-30 ENCOUNTER — Ambulatory Visit (INDEPENDENT_AMBULATORY_CARE_PROVIDER_SITE_OTHER): Admitting: Internal Medicine

## 2024-05-30 VITALS — BP 128/78 | HR 92 | Temp 97.9°F | Ht 64.0 in | Wt 209.2 lb

## 2024-05-30 DIAGNOSIS — E1165 Type 2 diabetes mellitus with hyperglycemia: Secondary | ICD-10-CM

## 2024-05-30 DIAGNOSIS — I10 Essential (primary) hypertension: Secondary | ICD-10-CM | POA: Diagnosis not present

## 2024-05-30 DIAGNOSIS — H9201 Otalgia, right ear: Secondary | ICD-10-CM

## 2024-05-30 MED ORDER — CEFDINIR 300 MG PO CAPS: 300.0000 mg | ORAL_CAPSULE | Freq: Two times a day (BID) | ORAL | 0 refills | Status: DC

## 2024-05-30 NOTE — Progress Notes (Signed)
 Subjective:    Patient ID: Misty Jones, female    DOB: 1941-06-09, 83 y.o.   MRN: [id number]  Patient here for  Chief Complaint  Patient presents with   Ear Pain    C/O right ear pain x over one week. Had a sore throat 2 weeks ago but that has resolved. H/O wax impactions, could not get in to see Dr Milissa until 07/01/24.    HPI Here for work in appt - work in with concerns regarding earache. Noticed some drainage 2 weeks ago. Sore throat. Some cough. Gargled with salt water. Cough syrup. Cough drops. Is feeling some better. Some persistent right ear discomfort. Some discomfort - right neck - fullness. No sob. No fever. No vomiting. No increased cough now.    Past Medical History:  Diagnosis Date   B12 deficiency    Chicken pox    Diabetes mellitus (HCC)    GERD (gastroesophageal reflux disease)    History of diverticulitis of colon    History of dysplastic nevus 07/05/2018   left mid back shv - with moderate atypia   Hypercholesterolemia    Hypertension    Impacted cerumen of left ear 07/01/2022   Nasal drainage    Non-recurrent acute suppurative otitis media of right ear without spontaneous rupture of tympanic membrane 06/16/2022   Past Surgical History:  Procedure Laterality Date   ABDOMINAL HYSTERECTOMY  1984   BACK SURGERY  1986   ruptured disc, Dr [facility])   CHOLECYSTECTOMY  1991   CHOLECYSTECTOMY  1992   COLONOSCOPY WITH PROPOFOL  N/A 06/29/2016   Procedure: COLONOSCOPY WITH PROPOFOL ;  Surgeon: Lamar ONEIDA Holmes, MD;  Location: [facility] ENDOSCOPY;  Service: Endoscopy;  Laterality: N/A;   ESOPHAGOGASTRODUODENOSCOPY (EGD) WITH PROPOFOL  N/A 12/13/2017   Procedure: ESOPHAGOGASTRODUODENOSCOPY (EGD) WITH PROPOFOL ;  Surgeon: Holmes Lamar ONEIDA, MD;  Location: [facility] ENDOSCOPY;  Service: Endoscopy;  Laterality: N/A;   TONSILLECTOMY     Family History  Problem Relation Age of Onset   Hypertension Mother    Cancer Mother        Mouth cancer   Heart disease Father         myocardial infarction   Raynaud syndrome Sister    Asthma Sister    Congenital heart disease Sister    Thyroid  disease Sister    Thyroid  cancer Daughter    Breast cancer Neg Hx    Colon cancer Neg Hx    Social History   Socioeconomic History   Marital status: Divorced    Spouse name: Not on file   Number of children: 2   Years of education: Not on file   Highest education level: Not on file  Occupational History   Occupation: retired  Tobacco Use   Smoking status: Former    Current packs/day: 0.00    Average packs/day: 0.5 packs/day for 20.0 years (10.0 ttl pk-yrs)    Types: Cigarettes    Start date: 07/26/1964    Quit date: 07/26/1984    Years since quitting: 39.8   Smokeless tobacco: Never   Tobacco comments:    quit smoking years ago  Vaping Use   Vaping status: Never Used  Substance and Sexual Activity   Alcohol use: No    Alcohol/week: 0.0 standard drinks of alcohol   Drug use: No   Sexual activity: Never  Other Topics Concern   Not on file  Social History Narrative   divorced   Social Drivers of Corporate Investment Banker Strain:  Low Risk  (11/02/2023)   Received from [facility]   Overall Financial Resource Strain (CARDIA)    Difficulty of Paying Living Expenses: Not hard at all  Food Insecurity: No Food Insecurity (11/02/2023)   Received from [facility]   Hunger Vital Sign    Within the past 12 months, you worried that your food would run out before you got the money to buy more.: Never true    Within the past 12 months, the food you bought just didn't last and you didn't have money to get more.: Never true  Transportation Needs: No Transportation Needs (11/02/2023)   Received from [facility] - Transportation    In the past 12 months, has lack of transportation kept you from medical appointments or from getting medications?: No    Lack of Transportation (Non-Medical): No  Physical  Activity: Inactive (05/11/2023)   Exercise Vital Sign    Days of Exercise per Week: 0 days    Minutes of Exercise per Session: 0 min  Stress: No Stress Concern Present (05/11/2023)   [employer] of Occupational Health - Occupational Stress Questionnaire    Feeling of Stress : Not at all  Social Connections: Moderately Isolated (05/11/2023)   Social Connection and Isolation Panel    Frequency of Communication with Friends and Family: More than three times a week    Frequency of Social Gatherings with Friends and Family: Once a week    Attends Religious Services: More than 4 times per year    Active Member of [employer] or Organizations: No    Attends Banker Meetings: Never    Marital Status: Divorced     Review of Systems  Constitutional:  Negative for appetite change and fever.  HENT:  Positive for ear pain and postnasal drip.   Respiratory:  Negative for chest tightness and shortness of breath.        No significant cough now.   Cardiovascular:  Negative for chest pain, palpitations and leg swelling.  Gastrointestinal:  Negative for abdominal pain, diarrhea, nausea and vomiting.  Genitourinary:  Negative for difficulty urinating and dysuria.  Musculoskeletal:  Negative for joint swelling and myalgias.  Skin:  Negative for color change and rash.  Neurological:  Negative for dizziness and headaches.  Psychiatric/Behavioral:  Negative for agitation and dysphoric mood.        Objective:     BP 128/78   Pulse 92   Temp 97.9 F (36.6 C) (Oral)   Ht 5' 4 (1.626 m)   Wt 209 lb 4 oz (94.9 kg)   SpO2 98%   BMI 35.92 kg/m  Wt Readings from Last 3 Encounters:  05/30/24 209 lb 4 oz (94.9 kg)  04/06/24 207 lb 3.2 oz (94 kg)  11/05/23 218 lb (98.9 kg)    Physical Exam Vitals reviewed.  Constitutional:      General: She is not in acute distress.    Appearance: Normal appearance.  HENT:     Head: Normocephalic and atraumatic.     Right Ear: External ear  normal. There is no impacted cerumen.     Left Ear: External ear normal. There is no impacted cerumen.     Ears:     Comments: Right ear - minimal erythema.  Eyes:     General: No scleral icterus.       Right eye: No discharge.        Left eye: No discharge.  Conjunctiva/sclera: Conjunctivae normal.  Neck:     Thyroid : No thyromegaly.     Comments: Minimal fullness - right lateral neck.  Cardiovascular:     Rate and Rhythm: Normal rate and regular rhythm.  Pulmonary:     Effort: No respiratory distress.     Breath sounds: Normal breath sounds. No wheezing.  Abdominal:     General: Bowel sounds are normal.     Palpations: Abdomen is soft.     Tenderness: There is no abdominal tenderness.  Musculoskeletal:        General: No swelling or tenderness.     Cervical back: Neck supple.  Skin:    Findings: No erythema or rash.  Neurological:     Mental Status: She is alert.  Psychiatric:        Mood and Affect: Mood normal.        Behavior: Behavior normal.         Outpatient Encounter Medications as of 05/30/2024  Medication Sig   albuterol  (VENTOLIN  HFA) 108 (90 Base) MCG/ACT inhaler Inhale 2 puffs into the lungs every 6 (six) hours as needed for wheezing or shortness of breath.   amLODipine  (NORVASC ) 5 MG tablet Take 1 tablet by mouth once daily   aspirin  81 MG tablet Take 81 mg by mouth daily.   azelastine (ASTELIN) 0.1 % nasal spray Place into the nose.   benazepril  (LOTENSIN ) 40 MG tablet Take 1 tablet by mouth once daily   budesonide -formoterol  (SYMBICORT ) 80-4.5 MCG/ACT inhaler Inhale 2 puffs into the lungs in the morning and at bedtime.   cefdinir  (OMNICEF ) 300 MG capsule Take 1 capsule (300 mg total) by mouth 2 (two) times daily.   cetirizine (ZYRTEC) 10 MG tablet Take 10 mg by mouth daily.   Cholecalciferol (VITAMIN D -3) 125 MCG (5000 UT) TABS Take 1 tablet by mouth daily.   cyanocobalamin  (VITAMIN B12) 1000 MCG/ML injection Inject 1 mL (1,000 mcg total) into the  muscle every 30 (thirty) days.   fluticasone  (FLONASE ) 50 MCG/ACT nasal spray Place 1 spray into both nostrils daily.   glipiZIDE  (GLUCOTROL  XL) 5 MG 24 hr tablet Take 1 tablet by mouth once daily with breakfast   glucose blood (CONTOUR TEST) test strip USE 1 STRIP TO CHECK GLUCOSE ONCE DAILY   hydrochlorothiazide  (HYDRODIURIL ) 25 MG tablet Take 1/2 (one-half) tablet by mouth once daily   Lancets MISC Check sugars once a day Dx: E11.9 (Contour Next)   Multiple Vitamins-Minerals (ICAPS AREDS 2 PO) Take 1 tablet by mouth in the morning and at bedtime.   niacin 500 MG tablet Take 500 mg by mouth at bedtime.   omeprazole  (PRILOSEC ) 20 MG capsule Take 20 mg by mouth daily.   Syringe/Needle, Disp, (SYRINGE 3CC/25GX1) 25G X 1 3 ML MISC Use as directed to administer b12 injections.   No facility-administered encounter medications on file as of 05/30/2024.     Lab Results  Component Value Date   WBC 6.6 04/06/2024   HGB 12.9 04/06/2024   HCT 40.1 04/06/2024   PLT 299.0 04/06/2024   GLUCOSE 139 (H) 04/06/2024   CHOL 279 (H) 04/06/2024   TRIG 183.0 (H) 04/06/2024   HDL 54.20 04/06/2024   LDLDIRECT 150.0 12/31/2021   LDLCALC 188 (H) 04/06/2024   ALT 15 04/06/2024   AST 13 04/06/2024   NA 140 04/06/2024   K 4.4 04/06/2024   CL 99 04/06/2024   CREATININE 0.86 04/06/2024   BUN 16 04/06/2024   CO2 32 04/06/2024   TSH  4.22 12/09/2023   INR 0.9 04/03/2012   HGBA1C 7.4 (H) 04/06/2024   MICROALBUR <0.7 04/06/2024       Assessment & Plan:  Primary hypertension Assessment & Plan: Currently on benazepril  and hydrochlorothiazide  and amlodipine .  No change today. Follow pressures. Recheck blood pressure today - wnl.    Type 2 diabetes mellitus with hyperglycemia, without long-term current use of insulin (HCC) Assessment & Plan: Low carb diet and exercise. Continue to follow sugars.    Earache on right Assessment & Plan: Exam as outlined. Minimal erythema and palpable fullness - right  lateral neck - treat with omnicef  as directed.  Steroid nasal spray as directed. Follow. Call with update.    Other orders -     Cefdinir ; Take 1 capsule (300 mg total) by mouth 2 (two) times daily.  Dispense: 14 capsule; Refill: 0     Allena Hamilton, MD

## 2024-06-04 ENCOUNTER — Encounter: Payer: Self-pay | Admitting: Internal Medicine

## 2024-06-04 DIAGNOSIS — H9201 Otalgia, right ear: Secondary | ICD-10-CM | POA: Insufficient documentation

## 2024-06-04 NOTE — Assessment & Plan Note (Signed)
 Currently on benazepril  and hydrochlorothiazide  and amlodipine .  No change today. Follow pressures. Recheck blood pressure today - wnl.

## 2024-06-04 NOTE — Assessment & Plan Note (Signed)
 Low carb diet and exercise. Continue to follow sugars.

## 2024-06-04 NOTE — Assessment & Plan Note (Signed)
 Exam as outlined. Minimal erythema and palpable fullness - right lateral neck - treat with omnicef  as directed.  Steroid nasal spray as directed. Follow. Call with update.

## 2024-06-13 ENCOUNTER — Other Ambulatory Visit: Payer: Self-pay | Admitting: Internal Medicine

## 2024-06-16 DIAGNOSIS — H353221 Exudative age-related macular degeneration, left eye, with active choroidal neovascularization: Secondary | ICD-10-CM | POA: Diagnosis not present

## 2024-06-19 ENCOUNTER — Encounter: Payer: Self-pay | Admitting: Internal Medicine

## 2024-06-20 NOTE — Telephone Encounter (Signed)
 She is having leg swelling. Please call and confirm no other symptoms. Any sob? Swelling worse as day progresses? Need to know how her blood pressures are running. Will also need an appt to discuss treatment options.

## 2024-06-20 NOTE — Telephone Encounter (Signed)
 Spoke with patient & scheduled her an appt with Dr Glendia on 12/4 @ 4pm  Pt did not take her Amlodipine  and has bilateral leg and ankle swelling. Left side is worse. She has not checked her BP yet, but will check it this evening & send in a reading.

## 2024-06-21 NOTE — Telephone Encounter (Signed)
 Reviewed note. If having increased swelling with one leg worse - I would like for her to be evaluated prior to the appt with me - just to confirm nothing more acute going on. Let her know that I am out of the office, but would like for her to be checked. We can f/u at the scheduled appt.  Continue leg elevation.

## 2024-06-24 ENCOUNTER — Other Ambulatory Visit: Payer: Self-pay | Admitting: Internal Medicine

## 2024-06-29 ENCOUNTER — Ambulatory Visit: Admitting: Internal Medicine

## 2024-06-29 ENCOUNTER — Encounter: Payer: Self-pay | Admitting: Internal Medicine

## 2024-06-29 VITALS — BP 140/80 | HR 94 | Temp 98.0°F | Ht 64.0 in | Wt 213.8 lb

## 2024-06-29 DIAGNOSIS — E7800 Pure hypercholesterolemia, unspecified: Secondary | ICD-10-CM | POA: Diagnosis not present

## 2024-06-29 DIAGNOSIS — M7989 Other specified soft tissue disorders: Secondary | ICD-10-CM | POA: Diagnosis not present

## 2024-06-29 DIAGNOSIS — I10 Essential (primary) hypertension: Secondary | ICD-10-CM | POA: Diagnosis not present

## 2024-06-29 DIAGNOSIS — E1165 Type 2 diabetes mellitus with hyperglycemia: Secondary | ICD-10-CM

## 2024-06-29 MED ORDER — MUPIROCIN 2 % EX OINT: 1.0000 | TOPICAL_OINTMENT | Freq: Two times a day (BID) | CUTANEOUS | 0 refills | Status: DC

## 2024-06-29 MED ORDER — AMLODIPINE BESYLATE 2.5 MG PO TABS: 2.5000 mg | ORAL_TABLET | Freq: Every day | ORAL | 1 refills | Status: DC

## 2024-06-29 NOTE — Progress Notes (Signed)
 Subjective:    Patient ID: Misty Jones, female    DOB: Mar 30, 1941, 83 y.o.   MRN: [id number]  Patient here for  Chief Complaint  Patient presents with   Leg Swelling    HPI Here for work in appt - work in to discuss - lower extremity swelling. Currently on benazepril , hydrochlorothiazide  and amlodipine  for her blood pressure. Concerned amlodipine  causing increased lower extremity swelling. She stopped her amlodipine . Last dose - approximately one week ago. Taking benazepril  and hydrochlorothiazide . Reports blood pressures - had been 130s/70s. No chest pain or sob reported. No abdominal pain or bowel change reported.    Past Medical History:  Diagnosis Date   B12 deficiency    Chicken pox    Diabetes mellitus (HCC)    GERD (gastroesophageal reflux disease)    History of diverticulitis of colon    History of dysplastic nevus 07/05/2018   left mid back shv - with moderate atypia   Hypercholesterolemia    Hypertension    Impacted cerumen of left ear 07/01/2022   Nasal drainage    Non-recurrent acute suppurative otitis media of right ear without spontaneous rupture of tympanic membrane 06/16/2022   Past Surgical History:  Procedure Laterality Date   ABDOMINAL HYSTERECTOMY  1984   BACK SURGERY  1986   ruptured disc, [facility])   CHOLECYSTECTOMY  1991   CHOLECYSTECTOMY  1992   COLONOSCOPY WITH PROPOFOL  N/A 06/29/2016   Procedure: COLONOSCOPY WITH PROPOFOL ;  Surgeon: Lamar ONEIDA Holmes, MD;  Location: [facility] ENDOSCOPY;  Service: Endoscopy;  Laterality: N/A;   ESOPHAGOGASTRODUODENOSCOPY (EGD) WITH PROPOFOL  N/A 12/13/2017   Procedure: ESOPHAGOGASTRODUODENOSCOPY (EGD) WITH PROPOFOL ;  Surgeon: Holmes Lamar ONEIDA, MD;  Location: [facility] ENDOSCOPY;  Service: Endoscopy;  Laterality: N/A;   TONSILLECTOMY     Family History  Problem Relation Age of Onset   Hypertension Mother    Cancer Mother        Mouth cancer   Heart disease Father        myocardial infarction   Raynaud  syndrome Sister    Asthma Sister    Congenital heart disease Sister    Thyroid  disease Sister    Thyroid  cancer Daughter    Breast cancer Neg Hx    Colon cancer Neg Hx    Social History   Socioeconomic History   Marital status: Divorced    Spouse name: Not on file   Number of children: 2   Years of education: Not on file   Highest education level: Not on file  Occupational History   Occupation: retired  Tobacco Use   Smoking status: Former    Current packs/day: 0.00    Average packs/day: 0.5 packs/day for 20.0 years (10.0 ttl pk-yrs)    Types: Cigarettes    Start date: 07/26/1964    Quit date: 07/26/1984    Years since quitting: 39.9   Smokeless tobacco: Never   Tobacco comments:    quit smoking years ago  Vaping Use   Vaping status: Never Used  Substance and Sexual Activity   Alcohol use: No    Alcohol/week: 0.0 standard drinks of alcohol   Drug use: No   Sexual activity: Never  Other Topics Concern   Not on file  Social History Narrative   divorced   Social Drivers of Health   Tobacco Use: Medium Risk (07/09/2024)   Patient History    Smoking Tobacco Use: Former    Smokeless Tobacco Use: Never    Passive Exposure:  Not on file  Financial Resource Strain: Low Risk (07/04/2024)   Overall Financial Resource Strain (CARDIA)    Difficulty of Paying Living Expenses: Not hard at all  Food Insecurity: No Food Insecurity (07/04/2024)   Epic    Worried About Programme Researcher, Broadcasting/film/video in the Last Year: Never true    Ran Out of Food in the Last Year: Never true  Transportation Needs: No Transportation Needs (07/04/2024)   Epic    Lack of Transportation (Medical): No    Lack of Transportation (Non-Medical): No  Physical Activity: Inactive (07/04/2024)   Exercise Vital Sign    Days of Exercise per Week: 0 days    Minutes of Exercise per Session: 0 min  Stress: No Stress Concern Present (07/04/2024)   [employer] of Occupational Health - Occupational Stress Questionnaire     Feeling of Stress: Not at all  Social Connections: Moderately Isolated (07/04/2024)   Social Connection and Isolation Panel    Frequency of Communication with Friends and Family: More than three times a week    Frequency of Social Gatherings with Friends and Family: Once a week    Attends Religious Services: More than 4 times per year    Active Member of Clubs or Organizations: No    Attends Banker Meetings: Never    Marital Status: Divorced  Depression (PHQ2-9): Low Risk (07/04/2024)   Depression (PHQ2-9)    PHQ-2 Score: 1  Alcohol Screen: Low Risk (07/04/2024)   Alcohol Screen    Last Alcohol Screening Score (AUDIT): 0  Housing: Low Risk (07/04/2024)   Epic    Unable to Pay for Housing in the Last Year: No    Number of Times Moved in the Last Year: 0    Homeless in the Last Year: No  Utilities: Not At Risk (07/04/2024)   Epic    Threatened with loss of utilities: No  Health Literacy: Inadequate Health Literacy (07/04/2024)   B1300 Health Literacy    Frequency of need for help with medical instructions: Sometimes     Review of Systems  Constitutional:  Negative for appetite change and unexpected weight change.  HENT:  Negative for congestion and sinus pressure.   Respiratory:  Negative for cough, chest tightness and shortness of breath.   Cardiovascular:  Positive for leg swelling. Negative for chest pain and palpitations.  Gastrointestinal:  Negative for abdominal pain, nausea and vomiting.  Genitourinary:  Negative for difficulty urinating and dysuria.  Musculoskeletal:  Negative for joint swelling and myalgias.  Skin:  Negative for color change and rash.  Neurological:  Negative for dizziness and headaches.  Psychiatric/Behavioral:  Negative for agitation and dysphoric mood.        Objective:     BP 138/78   Pulse 94   Temp 98 F (36.7 C) (Oral)   Ht 5' 4 (1.626 m)   Wt 213 lb 12.8 oz (97 kg)   SpO2 98%   BMI 36.70 kg/m  Wt Readings from Last 3  Encounters:  07/04/24 213 lb (96.6 kg)  06/29/24 213 lb 12.8 oz (97 kg)  05/30/24 209 lb 4 oz (94.9 kg)    Physical Exam Vitals reviewed.  Constitutional:      General: She is not in acute distress.    Appearance: Normal appearance.  HENT:     Head: Normocephalic and atraumatic.     Right Ear: External ear normal.     Left Ear: External ear normal.     Mouth/Throat:  Pharynx: No oropharyngeal exudate or posterior oropharyngeal erythema.  Eyes:     General: No scleral icterus.       Right eye: No discharge.        Left eye: No discharge.     Conjunctiva/sclera: Conjunctivae normal.  Neck:     Thyroid : No thyromegaly.  Cardiovascular:     Rate and Rhythm: Normal rate and regular rhythm.  Pulmonary:     Effort: No respiratory distress.     Breath sounds: Normal breath sounds. No wheezing.  Abdominal:     General: Bowel sounds are normal.     Palpations: Abdomen is soft.     Tenderness: There is no abdominal tenderness.  Musculoskeletal:        General: No tenderness.     Cervical back: Neck supple. No tenderness.     Comments: Lower extremity swelling.   Lymphadenopathy:     Cervical: No cervical adenopathy.  Skin:    Findings: No erythema or rash.  Neurological:     Mental Status: She is alert.  Psychiatric:        Mood and Affect: Mood normal.        Behavior: Behavior normal.         Outpatient Encounter Medications as of 06/29/2024  Medication Sig   albuterol  (VENTOLIN  HFA) 108 (90 Base) MCG/ACT inhaler Inhale 2 puffs into the lungs every 6 (six) hours as needed for wheezing or shortness of breath.   aspirin  81 MG tablet Take 81 mg by mouth daily.   azelastine (ASTELIN) 0.1 % nasal spray Place into the nose.   benazepril  (LOTENSIN ) 40 MG tablet Take 1 tablet by mouth once daily   budesonide -formoterol  (SYMBICORT ) 80-4.5 MCG/ACT inhaler Inhale 2 puffs into the lungs in the morning and at bedtime.   Cholecalciferol (VITAMIN D -3) 125 MCG (5000 UT) TABS Take  1 tablet by mouth daily.   cyanocobalamin  (VITAMIN B12) 1000 MCG/ML injection Inject 1 mL (1,000 mcg total) into the muscle every 30 (thirty) days.   fluticasone  (FLONASE ) 50 MCG/ACT nasal spray Place 1 spray into both nostrils daily. (Patient taking differently: Place 1 spray into both nostrils daily as needed.)   glipiZIDE  (GLUCOTROL  XL) 5 MG 24 hr tablet Take 1 tablet by mouth once daily with breakfast   glucose blood (CONTOUR TEST) test strip USE 1 STRIP TO CHECK GLUCOSE ONCE DAILY   hydrochlorothiazide  (HYDRODIURIL ) 25 MG tablet Take 1/2 (one-half) tablet by mouth once daily   Lancets MISC Check sugars once a day Dx: E11.9 (Contour Next)   Multiple Vitamins-Minerals (ICAPS AREDS 2 PO) Take 1 tablet by mouth in the morning and at bedtime.   niacin 500 MG tablet Take 500 mg by mouth at bedtime.   omeprazole  (PRILOSEC ) 20 MG capsule Take 20 mg by mouth daily.   Syringe/Needle, Disp, (SYRINGE 3CC/25GX1) 25G X 1 3 ML MISC Use as directed to administer b12 injections.   [DISCONTINUED] amLODipine  (NORVASC ) 2.5 MG tablet Take 1 tablet (2.5 mg total) by mouth daily.   [DISCONTINUED] cefdinir  (OMNICEF ) 300 MG capsule Take 1 capsule (300 mg total) by mouth 2 (two) times daily. (Patient not taking: Reported on 07/04/2024)   [DISCONTINUED] cetirizine (ZYRTEC) 10 MG tablet Take 10 mg by mouth daily. (Patient not taking: Reported on 07/04/2024)   [DISCONTINUED] mupirocin  ointment (BACTROBAN ) 2 % Apply 1 Application topically 2 (two) times daily. (Patient not taking: Reported on 07/04/2024)   [DISCONTINUED] amLODipine  (NORVASC ) 5 MG tablet Take 1 tablet by mouth once daily (Patient  not taking: Reported on 06/29/2024)   [DISCONTINUED] benazepril  (LOTENSIN ) 40 MG tablet Take 1 tablet by mouth once daily   No facility-administered encounter medications on file as of 06/29/2024.     Lab Results  Component Value Date   WBC 6.6 04/06/2024   HGB 12.9 04/06/2024   HCT 40.1 04/06/2024   PLT 299.0 04/06/2024    GLUCOSE 139 (H) 04/06/2024   CHOL 279 (H) 04/06/2024   TRIG 183.0 (H) 04/06/2024   HDL 54.20 04/06/2024   LDLDIRECT 150.0 12/31/2021   LDLCALC 188 (H) 04/06/2024   ALT 15 04/06/2024   AST 13 04/06/2024   NA 140 04/06/2024   K 4.4 04/06/2024   CL 99 04/06/2024   CREATININE 0.86 04/06/2024   BUN 16 04/06/2024   CO2 32 04/06/2024   TSH 4.22 12/09/2023   INR 0.9 04/03/2012   HGBA1C 7.4 (H) 04/06/2024   MICROALBUR <0.7 04/06/2024       Assessment & Plan:  Swelling of lower extremity Assessment & Plan: Lower extremity swelling. Feels amlodipine  is causing. Will restart amlodipine  at lower dose - 2.5mg  q day. Follow pressures. Avoid increased salt intake. Elevate legs when sitting. Compression hose. Follow.    Primary hypertension Assessment & Plan: Blood pressure as outlined. Off amlodipine . Feels is contributing ot her lower extremity swelling. Will have her restart amlodipine , but start 2.5mg  q day. Continue benazepril  and 1/2 hydrochlorothiazide . Follow pressures. Follow metabolic panel. Monitor swelling as outlined.   Orders: -     Basic metabolic panel with GFR; Future  Type 2 diabetes mellitus with hyperglycemia, without long-term current use of insulin (HCC) Assessment & Plan: Low carb diet and exercise. Follow met b and A1c.   Orders: -     Basic metabolic panel with GFR; Future -     Hepatic function panel; Future -     Hemoglobin A1c; Future  Hypercholesterolemia Assessment & Plan: Intolerant to statins.  Off repatha .  Did not tolerate zetia . On niacin. Low cholesterol diet and exercise.  Follow lipid panel and liver function tests.  Follow lipid panel.  Lab Results  Component Value Date   CHOL 279 (H) 04/06/2024   HDL 54.20 04/06/2024   LDLCALC 188 (H) 04/06/2024   LDLDIRECT 150.0 12/31/2021   TRIG 183.0 (H) 04/06/2024   CHOLHDL 5 04/06/2024     Orders: -     Hepatic function panel; Future -     Lipid panel; Future -     CBC with Differential/Platelet;  Future     Allena Hamilton, MD

## 2024-06-30 ENCOUNTER — Other Ambulatory Visit: Payer: Self-pay

## 2024-06-30 ENCOUNTER — Telehealth: Payer: Self-pay

## 2024-06-30 MED ORDER — AMLODIPINE BESYLATE 2.5 MG PO TABS: 2.5000 mg | ORAL_TABLET | Freq: Every day | ORAL | 1 refills | Status: DC

## 2024-06-30 NOTE — Telephone Encounter (Signed)
 Resent to d/c  5mg 

## 2024-06-30 NOTE — Telephone Encounter (Signed)
 Noted will resend prescription

## 2024-06-30 NOTE — Telephone Encounter (Unsigned)
 Copied from CRM #[id number]. Topic: Clinical - Medication Question >> Jun 30, 2024 10:37 AM Franky GRADE wrote: Reason for CRM: Pharmacy is calling for clarification on the amLODipine  (NORVASC ) 2.5 MG tablet [[id number]] prescription they received yesterday.  [phone].

## 2024-06-30 NOTE — Telephone Encounter (Signed)
 Copied from CRM #[id number]. Topic: Clinical - Prescription Issue >> Jun 29, 2024  4:57 PM Shereese L wrote: Reason for CRM: [facility] pharmacy called in to verify if the patient is supposed to be on amLODipine  (NORVASC ) 2.5 MG tablet or amLODipine  (NORVASC ) 5 MG tablet because they received a prescription for both CB# [phone] option#4

## 2024-06-30 NOTE — Telephone Encounter (Signed)
 Sorry - it is just supposed to be the 2.5mg . I meant to cancel the 5mg  tablet. Please cancel

## 2024-07-04 ENCOUNTER — Ambulatory Visit

## 2024-07-04 ENCOUNTER — Telehealth: Payer: Self-pay | Admitting: *Deleted

## 2024-07-04 VITALS — BP 152/86 | Ht 64.0 in | Wt 213.0 lb

## 2024-07-04 DIAGNOSIS — Z0000 Encounter for general adult medical examination without abnormal findings: Secondary | ICD-10-CM

## 2024-07-04 NOTE — Telephone Encounter (Signed)
 Please call and confirm how she is doing. Any other symptoms? Please confirm what blood pressure medication she is taking now and when taking? Does she have another way to check to confirm cuff is accurate.  Has she rechecked her blood pressure

## 2024-07-04 NOTE — Patient Instructions (Signed)
 Ms. Kinter,  Thank you for taking the time for your Medicare Wellness Visit. I appreciate your continued commitment to your health goals. Please review the care plan we discussed, and feel free to reach out if I can assist you further.  Please note that Annual Wellness Visits do not include a physical exam. Some assessments may be limited, especially if the visit was conducted virtually. If needed, we may recommend an in-person follow-up with your provider.  Ongoing Care Seeing your primary care provider every 3 to 6 months helps us  monitor your health and provide consistent, personalized care.  Consider getting your mammogram and dexa scan.   Referrals If a referral was made during today's visit and you haven't received any updates within two weeks, please contact the referred provider directly to check on the status.  Recommended Screenings:  Health Maintenance  Topic Date Due   COVID-19 Vaccine (3 - Moderna risk series) 07/26/2020   Breast Cancer Screening  06/29/2024   Hemoglobin A1C  10/04/2024   Complete foot exam   11/04/2024   Yearly kidney function blood test for diabetes  04/06/2025   Yearly kidney health urinalysis for diabetes  04/06/2025   Eye exam for diabetics  05/11/2025   Medicare Annual Wellness Visit  07/04/2025   Pneumococcal Vaccine for age over 67  Completed   Flu Shot  Completed   Osteoporosis screening with Bone Density Scan  Completed   Zoster (Shingles) Vaccine  Completed   Meningitis B Vaccine  Aged Out   DTaP/Tdap/Td vaccine  Discontinued       07/04/2024    1:11 PM  Advanced Directives  Does Patient Have a Medical Advance Directive? Yes  Type of Estate Agent of [place];Living will  Does patient want to make changes to medical advance directive? No - Patient declined  Copy of Healthcare Power of Attorney in Chart? No - copy requested    Vision: Annual vision screenings are recommended for early detection of glaucoma,  cataracts, and diabetic retinopathy. These exams can also reveal signs of chronic conditions such as diabetes and high blood pressure.  Dental: Annual dental screenings help detect early signs of oral cancer, gum disease, and other conditions linked to overall health, including heart disease and diabetes.  Please see the attached documents for additional preventive care recommendations.

## 2024-07-04 NOTE — Telephone Encounter (Signed)
 See mychart message pt was called and triage

## 2024-07-04 NOTE — Telephone Encounter (Signed)
 Spoke with pt and she stated that she will continue taking the benazepril  40 mg daily and increase the hydrochlorothiazide  to 25 mg daily. She stated that she will not take the 2.5 mg amlodipine  because she read the side effects and she thinks it is what has caused her to have all the hives on her scalp that she has had in the past and caused her hands to itch. Pt stated that she has been having headaches but can't say that they are related to her bp pressure because she has a history of chronic headaches.

## 2024-07-04 NOTE — Telephone Encounter (Signed)
 She had been on amlodipine  for a long time with no problems with rash. Not sure that her rash is coming from amlodipine . Monitor pressures. Schedule a f/u appt with me - to follow regarding his blood pressure.

## 2024-07-04 NOTE — Telephone Encounter (Signed)
 See other message. Hydrochlorothiazide  increased to 25mg  q day. Continue benazepril . Will follow pressures.  Note made to schedule f/u appt. If any change or worsening problems, needs to be evaluated.

## 2024-07-04 NOTE — Progress Notes (Signed)
 Chief Complaint  Patient presents with   Medicare Wellness     Subjective:   Misty Jones is a 83 y.o. female who presents for a Medicare Annual Wellness Visit.  Visit info / Clinical Intake: Medicare Wellness Visit Type:: Subsequent Annual Wellness Visit Persons participating in visit and providing information:: patient Medicare Wellness Visit Mode:: Telephone If telephone:: video declined Since this visit was completed virtually, some vitals may be partially provided or unavailable. Missing vitals are due to the limitations of the virtual format.: Documented vitals are patient reported If Telephone or Video please confirm:: I connected with patient using audio/video enable telemedicine. I verified patient identity with two identifiers, discussed telehealth limitations, and patient agreed to proceed. Patient Location:: Home Provider Location:: Office/Home Interpreter Needed?: No Pre-visit prep was completed: yes AWV questionnaire completed by patient prior to visit?: no Living arrangements:: (!) lives alone Patient's Overall Health Status Rating: (!) fair Typical amount of pain: (!) a lot (because of knees for a long time) Does pain affect daily life?: no Are you currently prescribed opioids?: no  Dietary Habits and Nutritional Risks How many meals a day?: 3 Eats fruit and vegetables daily?: (!) no Most meals are obtained by: preparing own meals In the last 2 weeks, have you had any of the following?: none Diabetic:: (!) yes Any non-healing wounds?: no How often do you check your BS?: 1 Would you like to be referred to a Nutritionist or for Diabetic Management? : no  Functional Status Activities of Daily Living (to include ambulation/medication): Independent Ambulation: Independent with device- listed below Home Assistive Devices/Equipment: Cane (at times) Medication Administration: Independent Home Management (perform basic housework or laundry): Independent Manage  your own finances?: yes Primary transportation is: family / friends Concerns about vision?: (!) yes (has macular degeneration) Concerns about hearing?: (!) yes Uses hearing aids?: (!) yes Hear whispered voice?: -- (televisit)  Fall Screening Falls in the past year?: 1 Number of falls in past year: 1 Was there an injury with Fall?: 0 Fall Risk Category Calculator: 2 Patient Fall Risk Level: Moderate Fall Risk  Fall Risk Patient at Risk for Falls Due to: History of fall(s); Impaired balance/gait (slipped on dog urine) Fall risk Follow up: Falls evaluation completed; Falls prevention discussed  Home and Transportation Safety: All rugs have non-skid backing?: N/A, no rugs All stairs or steps have railings?: (!) no Grab bars in the bathtub or shower?: (!) no Have non-skid surface in bathtub or shower?: (!) no Good home lighting?: yes Regular seat belt use?: yes Hospital stays in the last year:: no  Cognitive Assessment Difficulty concentrating, remembering, or making decisions? : no Will 6CIT or Mini Cog be Completed: yes What year is it?: 0 points What month is it?: 0 points Give patient an address phrase to remember (5 components): [address] [place] About what time is it?: 0 points Count backwards from 20 to 1: 0 points Say the months of the year in reverse: 0 points Repeat the address phrase from earlier: 0 points 6 CIT Score: 0 points  Advance Directives (For Healthcare) Does Patient Have a Medical Advance Directive?: Yes Does patient want to make changes to medical advance directive?: No - Patient declined Type of Advance Directive: Healthcare Power of [place]; Living will Copy of Healthcare Power of Attorney in Chart?: No - copy requested Copy of Living Will in Chart?: No - copy requested  Reviewed/Updated  Reviewed/Updated: Reviewed All (Medical, Surgical, Family, Medications, Allergies, Care Teams, Patient Goals)  Allergies (verified) Augmentin  [amoxicillin-pot clavulanate] and Protonix  [pantoprazole  sodium]   Current Medications (verified) Outpatient Encounter Medications as of 07/04/2024  Medication Sig   albuterol  (VENTOLIN  HFA) 108 (90 Base) MCG/ACT inhaler Inhale 2 puffs into the lungs every 6 (six) hours as needed for wheezing or shortness of breath.   amLODipine  (NORVASC ) 2.5 MG tablet Take 1 tablet (2.5 mg total) by mouth daily.   aspirin  81 MG tablet Take 81 mg by mouth daily.   azelastine (ASTELIN) 0.1 % nasal spray Place into the nose.   benazepril  (LOTENSIN ) 40 MG tablet Take 1 tablet by mouth once daily   budesonide -formoterol  (SYMBICORT ) 80-4.5 MCG/ACT inhaler Inhale 2 puffs into the lungs in the morning and at bedtime.   Cholecalciferol (VITAMIN D -3) 125 MCG (5000 UT) TABS Take 1 tablet by mouth daily.   cyanocobalamin  (VITAMIN B12) 1000 MCG/ML injection Inject 1 mL (1,000 mcg total) into the muscle every 30 (thirty) days.   fluticasone  (FLONASE ) 50 MCG/ACT nasal spray Place 1 spray into both nostrils daily. (Patient taking differently: Place 1 spray into both nostrils daily as needed.)   glipiZIDE  (GLUCOTROL  XL) 5 MG 24 hr tablet Take 1 tablet by mouth once daily with breakfast   glucose blood (CONTOUR TEST) test strip USE 1 STRIP TO CHECK GLUCOSE ONCE DAILY   hydrochlorothiazide  (HYDRODIURIL ) 25 MG tablet Take 1/2 (one-half) tablet by mouth once daily   Lancets MISC Check sugars once a day Dx: E11.9 (Contour Next)   Multiple Vitamins-Minerals (ICAPS AREDS 2 PO) Take 1 tablet by mouth in the morning and at bedtime.   niacin 500 MG tablet Take 500 mg by mouth at bedtime.   omeprazole  (PRILOSEC ) 20 MG capsule Take 20 mg by mouth daily.   Syringe/Needle, Disp, (SYRINGE 3CC/25GX1) 25G X 1 3 ML MISC Use as directed to administer b12 injections.   cefdinir  (OMNICEF ) 300 MG capsule Take 1 capsule (300 mg total) by mouth 2 (two) times daily. (Patient not taking: Reported on 07/04/2024)   cetirizine (ZYRTEC) 10 MG tablet Take  10 mg by mouth daily. (Patient not taking: Reported on 07/04/2024)   mupirocin  ointment (BACTROBAN ) 2 % Apply 1 Application topically 2 (two) times daily. (Patient not taking: Reported on 07/04/2024)   No facility-administered encounter medications on file as of 07/04/2024.    History: Past Medical History:  Diagnosis Date   B12 deficiency    Chicken pox    Diabetes mellitus (HCC)    GERD (gastroesophageal reflux disease)    History of diverticulitis of colon    History of dysplastic nevus 07/05/2018   left mid back shv - with moderate atypia   Hypercholesterolemia    Hypertension    Impacted cerumen of left ear 07/01/2022   Nasal drainage    Non-recurrent acute suppurative otitis media of right ear without spontaneous rupture of tympanic membrane 06/16/2022   Past Surgical History:  Procedure Laterality Date   ABDOMINAL HYSTERECTOMY  1984   BACK SURGERY  1986   ruptured disc, Dr Orlando [facility])   CHOLECYSTECTOMY  1991   CHOLECYSTECTOMY  1992   COLONOSCOPY WITH PROPOFOL  N/A 06/29/2016   Procedure: COLONOSCOPY WITH PROPOFOL ;  Surgeon: Lamar ONEIDA Holmes, MD;  Location: [facility] ENDOSCOPY;  Service: Endoscopy;  Laterality: N/A;   ESOPHAGOGASTRODUODENOSCOPY (EGD) WITH PROPOFOL  N/A 12/13/2017   Procedure: ESOPHAGOGASTRODUODENOSCOPY (EGD) WITH PROPOFOL ;  Surgeon: Holmes Lamar ONEIDA, MD;  Location: [facility] ENDOSCOPY;  Service: Endoscopy;  Laterality: N/A;   TONSILLECTOMY     Family History  Problem Relation  Age of Onset   Hypertension Mother    Cancer Mother        Mouth cancer   Heart disease Father        myocardial infarction   Raynaud syndrome Sister    Asthma Sister    Congenital heart disease Sister    Thyroid  disease Sister    Thyroid  cancer Daughter    Breast cancer Neg Hx    Colon cancer Neg Hx    Social History   Occupational History   Occupation: retired  Tobacco Use   Smoking status: Former    Current packs/day: 0.00    Average packs/day: 0.5 packs/day for 20.0  years (10.0 ttl pk-yrs)    Types: Cigarettes    Start date: 07/26/1964    Quit date: 07/26/1984    Years since quitting: 39.9   Smokeless tobacco: Never   Tobacco comments:    quit smoking years ago  Vaping Use   Vaping status: Never Used  Substance and Sexual Activity   Alcohol use: No    Alcohol/week: 0.0 standard drinks of alcohol   Drug use: No   Sexual activity: Never   Tobacco Counseling Counseling given: Not Answered Tobacco comments: quit smoking years ago  SDOH Screenings   Food Insecurity: No Food Insecurity (07/04/2024)  Housing: Low Risk  (07/04/2024)  Transportation Needs: No Transportation Needs (07/04/2024)  Utilities: Not At Risk (07/04/2024)  Alcohol Screen: Low Risk  (07/04/2024)  Depression (PHQ2-9): Low Risk  (07/04/2024)  Financial Resource Strain: Low Risk  (07/04/2024)  Physical Activity: Inactive (07/04/2024)  Social Connections: Moderately Isolated (07/04/2024)  Stress: No Stress Concern Present (07/04/2024)  Tobacco Use: Medium Risk (07/04/2024)  Health Literacy: Inadequate Health Literacy (07/04/2024)   See flowsheets for full screening details  Depression Screen Depression Screening Exception Documentation Depression Screening Exception:: Medical reason  PHQ 2 & 9 Depression Scale- Over the past 2 weeks, how often have you been bothered by any of the following problems? Little interest or pleasure in doing things: 0 Feeling down, depressed, or hopeless (PHQ Adolescent also includes...irritable): 0 PHQ-2 Total Score: 0 Trouble falling or staying asleep, or sleeping too much: 0 Feeling tired or having little energy: 1 Poor appetite or overeating (PHQ Adolescent also includes...weight loss): 0 Feeling bad about yourself - or that you are a failure or have let yourself or your family down: 0 Trouble concentrating on things, such as reading the newspaper or watching television (PHQ Adolescent also includes...like school work): 0 Moving or speaking so  slowly that other people could have noticed. Or the opposite - being so fidgety or restless that you have been moving around a lot more than usual: 0 Thoughts that you would be better off dead, or of hurting yourself in some way: 0 PHQ-9 Total Score: 1 If you checked off any problems, how difficult have these problems made it for you to do your work, take care of things at home, or get along with other people?: Not difficult at all     Goals Addressed             This Visit's Progress    Patient Stated       Wants to lose some weight.             Objective:    Today's Vitals   07/04/24 1302 07/04/24 1338  BP: (!) 161/113 (!) 152/86  Weight: 213 lb (96.6 kg)   Height: 5' 4 (1.626 m)    Body mass index  is 36.56 kg/m.  Hearing/Vision screen Hearing Screening - Comments:: Wears aids Vision Screening - Comments:: Readers or magnifying glass, [facility], up to date Immunizations and Health Maintenance Health Maintenance  Topic Date Due   COVID-19 Vaccine (3 - Moderna risk series) 07/26/2020   Mammogram  06/29/2024   HEMOGLOBIN A1C  10/04/2024   FOOT EXAM  11/04/2024   Diabetic kidney evaluation - eGFR measurement  04/06/2025   Diabetic kidney evaluation - Urine ACR  04/06/2025   OPHTHALMOLOGY EXAM  05/11/2025   Medicare Annual Wellness (AWV)  07/04/2025   Pneumococcal Vaccine: 50+ Years  Completed   Influenza Vaccine  Completed   Bone Density Scan  Completed   Zoster Vaccines- Shingrix  Completed   Meningococcal B Vaccine  Aged Out   DTaP/Tdap/Td  Discontinued        Assessment/Plan:  This is a routine wellness examination for Theone.  Patient Care Team: Glendia Shad, MD as PCP - General (Internal Medicine) Glendia Shad, MD (Internal Medicine) Milissa Hamming, MD as Referring Physician (Otolaryngology) Poggi, Norleen PARAS, MD as Consulting Physician (Orthopedic Surgery) Isadora Hose, MD as Consulting Physician (Pulmonary Disease)  I have personally  reviewed and noted the following in the patient's chart:   Medical and social history Use of alcohol, tobacco or illicit drugs  Current medications and supplements including opioid prescriptions. Functional ability and status Nutritional status Physical activity Advanced directives List of other physicians Hospitalizations, surgeries, and ER visits in previous 12 months Vitals Screenings to include cognitive, depression, and falls Referrals and appointments  No orders of the defined types were placed in this encounter.  In addition, I have reviewed and discussed with patient certain preventive protocols, quality metrics, and best practice recommendations. A written personalized care plan for preventive services as well as general preventive health recommendations were provided to patient.   Angeline Fredericks, LPN   [id number]   Return in 1 year (on 07/04/2025).  After Visit Summary: (MyChart) Due to this being a telephonic visit, the after visit summary with patients personalized plan was offered to patient via MyChart   Nurse Notes: Patient declines covid vaccine. Patient declines mammogram and dexa at this time because it is such a busy time. Phone note sent to PCP about blood pressure reading.

## 2024-07-04 NOTE — Telephone Encounter (Signed)
 Confirm she is taking 40mg  benazepril . If so, continue. Did not tolerate increase of amlodipine . Continue 2.5mg . can try increasing hydrochlorothiazide  to 25mg  q day. Follow pressures and send in readings. Any acute problems, needs to be evaluated.

## 2024-07-04 NOTE — Telephone Encounter (Signed)
 Performed AWV  Please see blood pressure readings 161/113 that was taken prior to the visit. Patient stated that she took her blood pressure medication about 8:30 and took her blood pressure around 10:00 am.. Patient took her blood pressure during the visit and it was 152/86. Patient stated that she feels fine. Patient stated that there was an adjustment in her Amlodipine  to 2. 5 mg at last office visit.

## 2024-07-05 NOTE — Telephone Encounter (Signed)
 Misty Jones she can only come in on Thursday, please schedule her an appt on Thursday 08/17/24. We just increased her hydrochlorothiazide  to 25mg  q day. She continues on benazepril . Follow pressures. If any acute issues, she will need to be seen prior to that appt. Please confirm rash resolved/ok.

## 2024-07-06 NOTE — Telephone Encounter (Signed)
 LVM, MYCHART MESSAGE ALSO SENT. PT IS ALREADY SCHEDULED TO BE SEEN

## 2024-07-09 ENCOUNTER — Encounter: Payer: Self-pay | Admitting: Internal Medicine

## 2024-07-09 DIAGNOSIS — M7989 Other specified soft tissue disorders: Secondary | ICD-10-CM | POA: Insufficient documentation

## 2024-07-09 NOTE — Assessment & Plan Note (Signed)
 Lower extremity swelling. Feels amlodipine  is causing. Will restart amlodipine  at lower dose - 2.5mg  q day. Follow pressures. Avoid increased salt intake. Elevate legs when sitting. Compression hose. Follow.

## 2024-07-09 NOTE — Assessment & Plan Note (Signed)
 Low-carb diet and exercise.  Follow met b and A1c.

## 2024-07-09 NOTE — Assessment & Plan Note (Signed)
 Intolerant to statins.  Off repatha .  Did not tolerate zetia . On niacin. Low cholesterol diet and exercise.  Follow lipid panel and liver function tests.  Follow lipid panel.  Lab Results  Component Value Date   CHOL 279 (H) 04/06/2024   HDL 54.20 04/06/2024   LDLCALC 188 (H) 04/06/2024   LDLDIRECT 150.0 12/31/2021   TRIG 183.0 (H) 04/06/2024   CHOLHDL 5 04/06/2024

## 2024-07-09 NOTE — Assessment & Plan Note (Signed)
 Blood pressure as outlined. Off amlodipine . Feels is contributing ot her lower extremity swelling. Will have her restart amlodipine , but start 2.5mg  q day. Continue benazepril  and 1/2 hydrochlorothiazide . Follow pressures. Follow metabolic panel. Monitor swelling as outlined.

## 2024-07-31 ENCOUNTER — Other Ambulatory Visit: Payer: Self-pay | Admitting: Internal Medicine

## 2024-07-31 DIAGNOSIS — Z1231 Encounter for screening mammogram for malignant neoplasm of breast: Secondary | ICD-10-CM

## 2024-08-11 LAB — OPHTHALMOLOGY REPORT-SCANNED

## 2024-08-17 ENCOUNTER — Ambulatory Visit: Admitting: Internal Medicine

## 2024-08-17 ENCOUNTER — Encounter: Payer: Self-pay | Admitting: Internal Medicine

## 2024-08-17 VITALS — BP 120/78 | HR 84 | Temp 98.4°F | Ht 64.0 in | Wt 212.0 lb

## 2024-08-17 DIAGNOSIS — Z860100 Personal history of colon polyps, unspecified: Secondary | ICD-10-CM | POA: Diagnosis not present

## 2024-08-17 DIAGNOSIS — E1165 Type 2 diabetes mellitus with hyperglycemia: Secondary | ICD-10-CM | POA: Diagnosis not present

## 2024-08-17 DIAGNOSIS — Z7984 Long term (current) use of oral hypoglycemic drugs: Secondary | ICD-10-CM | POA: Diagnosis not present

## 2024-08-17 DIAGNOSIS — Z0000 Encounter for general adult medical examination without abnormal findings: Secondary | ICD-10-CM

## 2024-08-17 DIAGNOSIS — M25561 Pain in right knee: Secondary | ICD-10-CM | POA: Diagnosis not present

## 2024-08-17 DIAGNOSIS — E2839 Other primary ovarian failure: Secondary | ICD-10-CM | POA: Diagnosis not present

## 2024-08-17 DIAGNOSIS — Z1231 Encounter for screening mammogram for malignant neoplasm of breast: Secondary | ICD-10-CM | POA: Diagnosis not present

## 2024-08-17 DIAGNOSIS — E7800 Pure hypercholesterolemia, unspecified: Secondary | ICD-10-CM

## 2024-08-17 DIAGNOSIS — T466X5A Adverse effect of antihyperlipidemic and antiarteriosclerotic drugs, initial encounter: Secondary | ICD-10-CM | POA: Diagnosis not present

## 2024-08-17 DIAGNOSIS — K219 Gastro-esophageal reflux disease without esophagitis: Secondary | ICD-10-CM | POA: Diagnosis not present

## 2024-08-17 DIAGNOSIS — G720 Drug-induced myopathy: Secondary | ICD-10-CM

## 2024-08-17 DIAGNOSIS — I10 Essential (primary) hypertension: Secondary | ICD-10-CM | POA: Diagnosis not present

## 2024-08-17 DIAGNOSIS — M25562 Pain in left knee: Secondary | ICD-10-CM

## 2024-08-17 LAB — LIPID PANEL
Cholesterol: 252 mg/dL — ABNORMAL HIGH (ref 28–200)
HDL: 55.7 mg/dL
LDL Cholesterol: 166 mg/dL — ABNORMAL HIGH (ref 10–99)
NonHDL: 195.86
Total CHOL/HDL Ratio: 5
Triglycerides: 150 mg/dL — ABNORMAL HIGH (ref 10.0–149.0)
VLDL: 30 mg/dL (ref 0.0–40.0)

## 2024-08-17 LAB — CBC WITH DIFFERENTIAL/PLATELET
Basophils Absolute: 0 K/uL (ref 0.0–0.1)
Basophils Relative: 0.7 % (ref 0.0–3.0)
Eosinophils Absolute: 0.2 K/uL (ref 0.0–0.7)
Eosinophils Relative: 2.3 % (ref 0.0–5.0)
HCT: 39 % (ref 36.0–46.0)
Hemoglobin: 12.9 g/dL (ref 12.0–15.0)
Lymphocytes Relative: 28.7 % (ref 12.0–46.0)
Lymphs Abs: 2 K/uL (ref 0.7–4.0)
MCHC: 33.1 g/dL (ref 30.0–36.0)
MCV: 91.1 fl (ref 78.0–100.0)
Monocytes Absolute: 0.5 K/uL (ref 0.1–1.0)
Monocytes Relative: 6.7 % (ref 3.0–12.0)
Neutro Abs: 4.3 K/uL (ref 1.4–7.7)
Neutrophils Relative %: 61.6 % (ref 43.0–77.0)
Platelets: 287 K/uL (ref 150.0–400.0)
RBC: 4.28 Mil/uL (ref 3.87–5.11)
RDW: 14.3 % (ref 11.5–15.5)
WBC: 6.9 K/uL (ref 4.0–10.5)

## 2024-08-17 LAB — BASIC METABOLIC PANEL WITH GFR
BUN: 24 mg/dL — ABNORMAL HIGH (ref 6–23)
CO2: 33 meq/L — ABNORMAL HIGH (ref 19–32)
Calcium: 9 mg/dL (ref 8.4–10.5)
Chloride: 100 meq/L (ref 96–112)
Creatinine, Ser: 0.94 mg/dL (ref 0.40–1.20)
GFR: 55.8 mL/min — ABNORMAL LOW
Glucose, Bld: 137 mg/dL — ABNORMAL HIGH (ref 70–99)
Potassium: 4.4 meq/L (ref 3.5–5.1)
Sodium: 140 meq/L (ref 135–145)

## 2024-08-17 LAB — HEPATIC FUNCTION PANEL
ALT: 16 U/L (ref 3–35)
AST: 17 U/L (ref 5–37)
Albumin: 4.1 g/dL (ref 3.5–5.2)
Alkaline Phosphatase: 63 U/L (ref 39–117)
Bilirubin, Direct: 0.1 mg/dL (ref 0.1–0.3)
Total Bilirubin: 0.5 mg/dL (ref 0.2–1.2)
Total Protein: 6.1 g/dL (ref 6.0–8.3)

## 2024-08-17 LAB — HEMOGLOBIN A1C: Hgb A1c MFr Bld: 7.2 % — ABNORMAL HIGH (ref 4.6–6.5)

## 2024-08-17 MED ORDER — AMLODIPINE BESYLATE 2.5 MG PO TABS: 2.5000 mg | ORAL_TABLET | Freq: Every day | ORAL | 1 refills | Status: AC

## 2024-08-17 MED ORDER — ALBUTEROL SULFATE HFA 108 (90 BASE) MCG/ACT IN AERS: 2.0000 | INHALATION_SPRAY | Freq: Four times a day (QID) | RESPIRATORY_TRACT | 1 refills | Status: AC | PRN

## 2024-08-17 MED ORDER — HYDROCHLOROTHIAZIDE 25 MG PO TABS: 25.0000 mg | ORAL_TABLET | Freq: Every day | ORAL | 1 refills | Status: AC

## 2024-08-17 MED ORDER — GLIPIZIDE ER 5 MG PO TB24: 5.0000 mg | ORAL_TABLET | Freq: Every day | ORAL | 1 refills | Status: AC

## 2024-08-17 NOTE — Progress Notes (Signed)
 "  Subjective:    Patient ID: Misty Jones, female    DOB: 11-Aug-1940, 84 y.o.   MRN: [id number]  Patient here for  Chief Complaint  Patient presents with   Medical Management of Chronic Issues   Annual Exam    HPI With past history of hypercholesterolemia, hypertension and diabetes - comes in today to follow up on these issues as well as for a complete physical exam. Has had issues with increased blood pressure recently. Currently on benazepril , amlodipine  and hydrochlorothiazide . Recently hydrochlorothiazide  increased to one whole tablet per day. Blood pressure doing better. No chest pain or sob reported. No abdominal pain or bowel change reported. Seeing ophthalmology. Eye stable. Increased stress with her eye issues. Overall appears to be handling things relatively well. Has seen ortho (Dr Edie) - knees. Recommended gel injections.    Past Medical History:  Diagnosis Date   B12 deficiency    Chicken pox    Diabetes mellitus (HCC)    GERD (gastroesophageal reflux disease)    History of diverticulitis of colon    History of dysplastic nevus 07/05/2018   left mid back shv - with moderate atypia   Hypercholesterolemia    Hypertension    Impacted cerumen of left ear 07/01/2022   Nasal drainage    Non-recurrent acute suppurative otitis media of right ear without spontaneous rupture of tympanic membrane 06/16/2022   Past Surgical History:  Procedure Laterality Date   ABDOMINAL HYSTERECTOMY  1984   BACK SURGERY  1986   ruptured disc, Dr [facility])   CHOLECYSTECTOMY  1991   CHOLECYSTECTOMY  1992   COLONOSCOPY WITH PROPOFOL  N/A 06/29/2016   Procedure: COLONOSCOPY WITH PROPOFOL ;  Surgeon: Lamar ONEIDA Holmes, MD;  Location: [facility] ENDOSCOPY;  Service: Endoscopy;  Laterality: N/A;   ESOPHAGOGASTRODUODENOSCOPY (EGD) WITH PROPOFOL  N/A 12/13/2017   Procedure: ESOPHAGOGASTRODUODENOSCOPY (EGD) WITH PROPOFOL ;  Surgeon: Holmes Lamar ONEIDA, MD;  Location: [facility] ENDOSCOPY;  Service:  Endoscopy;  Laterality: N/A;   TONSILLECTOMY     Family History  Problem Relation Age of Onset   Hypertension Mother    Cancer Mother        Mouth cancer   Heart disease Father        myocardial infarction   Raynaud syndrome Sister    Asthma Sister    Congenital heart disease Sister    Thyroid  disease Sister    Thyroid  cancer Daughter    Breast cancer Neg Hx    Colon cancer Neg Hx    Social History   Socioeconomic History   Marital status: Divorced    Spouse name: Not on file   Number of children: 2   Years of education: Not on file   Highest education level: Not on file  Occupational History   Occupation: retired  Tobacco Use   Smoking status: Former    Current packs/day: 0.00    Average packs/day: 0.5 packs/day for 20.0 years (10.0 ttl pk-yrs)    Types: Cigarettes    Start date: 07/26/1964    Quit date: 07/26/1984    Years since quitting: 40.0   Smokeless tobacco: Never   Tobacco comments:    quit smoking years ago  Vaping Use   Vaping status: Never Used  Substance and Sexual Activity   Alcohol use: No    Alcohol/week: 0.0 standard drinks of alcohol   Drug use: No   Sexual activity: Never  Other Topics Concern   Not on file  Social History Narrative   divorced  Social Drivers of Health   Tobacco Use: Medium Risk (08/21/2024)   Patient History    Smoking Tobacco Use: Former    Smokeless Tobacco Use: Never    Passive Exposure: Not on file  Financial Resource Strain: Low Risk (07/04/2024)   Overall Financial Resource Strain (CARDIA)    Difficulty of Paying Living Expenses: Not hard at all  Food Insecurity: No Food Insecurity (07/04/2024)   Epic    Worried About Programme Researcher, Broadcasting/film/video in the Last Year: Never true    Ran Out of Food in the Last Year: Never true  Transportation Needs: No Transportation Needs (07/04/2024)   Epic    Lack of Transportation (Medical): No    Lack of Transportation (Non-Medical): No  Physical Activity: Inactive (07/04/2024)    Exercise Vital Sign    Days of Exercise per Week: 0 days    Minutes of Exercise per Session: 0 min  Stress: No Stress Concern Present (07/04/2024)   [employer] of Occupational Health - Occupational Stress Questionnaire    Feeling of Stress: Not at all  Social Connections: Moderately Isolated (07/04/2024)   Social Connection and Isolation Panel    Frequency of Communication with Friends and Family: More than three times a week    Frequency of Social Gatherings with Friends and Family: Once a week    Attends Religious Services: More than 4 times per year    Active Member of Clubs or Organizations: No    Attends Banker Meetings: Never    Marital Status: Divorced  Depression (PHQ2-9): Low Risk (07/04/2024)   Depression (PHQ2-9)    PHQ-2 Score: 1  Alcohol Screen: Low Risk (07/04/2024)   Alcohol Screen    Last Alcohol Screening Score (AUDIT): 0  Housing: Low Risk (07/04/2024)   Epic    Unable to Pay for Housing in the Last Year: No    Number of Times Moved in the Last Year: 0    Homeless in the Last Year: No  Utilities: Not At Risk (07/04/2024)   Epic    Threatened with loss of utilities: No  Health Literacy: Inadequate Health Literacy (07/04/2024)   B1300 Health Literacy    Frequency of need for help with medical instructions: Sometimes     Review of Systems  Constitutional:  Negative for appetite change and unexpected weight change.  HENT:  Negative for congestion, sinus pressure and sore throat.   Eyes:  Negative for pain and visual disturbance.  Respiratory:  Negative for cough, chest tightness and shortness of breath.   Cardiovascular:  Negative for chest pain, palpitations and leg swelling.  Gastrointestinal:  Negative for abdominal pain, diarrhea, nausea and vomiting.  Genitourinary:  Negative for difficulty urinating and dysuria.  Musculoskeletal:  Negative for myalgias.       Knee pain. Seeing ortho.   Skin:  Negative for color change and rash.   Neurological:  Negative for dizziness and headaches.  Hematological:  Negative for adenopathy. Does not bruise/bleed easily.  Psychiatric/Behavioral:  Negative for agitation and dysphoric mood.        Objective:     BP 120/78   Pulse 84   Temp 98.4 F (36.9 C) (Oral)   Ht 5' 4 (1.626 m)   Wt 212 lb (96.2 kg)   SpO2 99%   BMI 36.39 kg/m  Wt Readings from Last 3 Encounters:  08/17/24 212 lb (96.2 kg)  07/04/24 213 lb (96.6 kg)  06/29/24 213 lb 12.8 oz (97 kg)  Physical Exam Vitals reviewed.  Constitutional:      General: She is not in acute distress.    Appearance: Normal appearance. She is well-developed.  HENT:     Head: Normocephalic and atraumatic.     Right Ear: External ear normal.     Left Ear: External ear normal.     Mouth/Throat:     Pharynx: No oropharyngeal exudate or posterior oropharyngeal erythema.  Eyes:     General: No scleral icterus.       Right eye: No discharge.        Left eye: No discharge.     Conjunctiva/sclera: Conjunctivae normal.  Neck:     Thyroid : No thyromegaly.  Cardiovascular:     Rate and Rhythm: Normal rate and regular rhythm.  Pulmonary:     Effort: No tachypnea, accessory muscle usage or respiratory distress.     Breath sounds: Normal breath sounds. No decreased breath sounds or wheezing.  Chest:  Breasts:    Right: No inverted nipple, mass, nipple discharge or tenderness (no axillary adenopathy).     Left: No inverted nipple, mass, nipple discharge or tenderness (no axilarry adenopathy).  Abdominal:     General: Bowel sounds are normal.     Palpations: Abdomen is soft.     Tenderness: There is no abdominal tenderness.  Musculoskeletal:        General: No swelling or tenderness.     Cervical back: Neck supple.  Lymphadenopathy:     Cervical: No cervical adenopathy.  Skin:    Findings: No erythema or rash.  Neurological:     Mental Status: She is alert and oriented to person, place, and time.  Psychiatric:         Mood and Affect: Mood normal.        Behavior: Behavior normal.         Outpatient Encounter Medications as of 08/17/2024  Medication Sig   aspirin  81 MG tablet Take 81 mg by mouth daily.   azelastine (ASTELIN) 0.1 % nasal spray Place into the nose.   benazepril  (LOTENSIN ) 40 MG tablet Take 1 tablet by mouth once daily   Cholecalciferol (VITAMIN D -3) 125 MCG (5000 UT) TABS Take 1 tablet by mouth daily.   cyanocobalamin  (VITAMIN B12) 1000 MCG/ML injection Inject 1 mL (1,000 mcg total) into the muscle every 30 (thirty) days.   fluticasone  (FLONASE ) 50 MCG/ACT nasal spray Place 1 spray into both nostrils daily. (Patient taking differently: Place 1 spray into both nostrils daily as needed.)   glucose blood (CONTOUR TEST) test strip USE 1 STRIP TO CHECK GLUCOSE ONCE DAILY   Lancets MISC Check sugars once a day Dx: E11.9 (Contour Next)   niacin 500 MG tablet Take 500 mg by mouth at bedtime.   omeprazole  (PRILOSEC ) 20 MG capsule Take 20 mg by mouth daily.   Syringe/Needle, Disp, (SYRINGE 3CC/25GX1) 25G X 1 3 ML MISC Use as directed to administer b12 injections.   [DISCONTINUED] albuterol  (VENTOLIN  HFA) 108 (90 Base) MCG/ACT inhaler Inhale 2 puffs into the lungs every 6 (six) hours as needed for wheezing or shortness of breath.   albuterol  (VENTOLIN  HFA) 108 (90 Base) MCG/ACT inhaler Inhale 2 puffs into the lungs every 6 (six) hours as needed for wheezing or shortness of breath.   amLODipine  (NORVASC ) 2.5 MG tablet Take 1 tablet (2.5 mg total) by mouth daily.   glipiZIDE  (GLUCOTROL  XL) 5 MG 24 hr tablet Take 1 tablet (5 mg total) by mouth daily with breakfast.  hydrochlorothiazide  (HYDRODIURIL ) 25 MG tablet Take 1 tablet (25 mg total) by mouth daily.   [DISCONTINUED] amLODipine  (NORVASC ) 2.5 MG tablet Take 1 tablet (2.5 mg total) by mouth daily.   [DISCONTINUED] budesonide -formoterol  (SYMBICORT ) 80-4.5 MCG/ACT inhaler Inhale 2 puffs into the lungs in the morning and at bedtime.   [DISCONTINUED]  glipiZIDE  (GLUCOTROL  XL) 5 MG 24 hr tablet Take 1 tablet by mouth once daily with breakfast   [DISCONTINUED] hydrochlorothiazide  (HYDRODIURIL ) 25 MG tablet Take 1/2 (one-half) tablet by mouth once daily   [DISCONTINUED] Multiple Vitamins-Minerals (ICAPS AREDS 2 PO) Take 1 tablet by mouth in the morning and at bedtime.   No facility-administered encounter medications on file as of 08/17/2024.     Lab Results  Component Value Date   WBC 6.9 08/17/2024   HGB 12.9 08/17/2024   HCT 39.0 08/17/2024   PLT 287.0 08/17/2024   GLUCOSE 137 (H) 08/17/2024   CHOL 252 (H) 08/17/2024   TRIG 150.0 (H) 08/17/2024   HDL 55.70 08/17/2024   LDLDIRECT 150.0 12/31/2021   LDLCALC 166 (H) 08/17/2024   ALT 16 08/17/2024   AST 17 08/17/2024   NA 140 08/17/2024   K 4.4 08/17/2024   CL 100 08/17/2024   CREATININE 0.94 08/17/2024   BUN 24 (H) 08/17/2024   CO2 33 (H) 08/17/2024   TSH 4.22 12/09/2023   INR 0.9 04/03/2012   HGBA1C 7.2 (H) 08/17/2024   MICROALBUR <0.7 04/06/2024       Assessment & Plan:  Estrogen deficiency -     DG Bone Density; Future  Hypercholesterolemia Assessment & Plan: Intolerant to statins.  Off repatha .  Did not tolerate zetia . On niacin. Low cholesterol diet and exercise.  Follow lipid panel.  Lab Results  Component Value Date   CHOL 252 (H) 08/17/2024   HDL 55.70 08/17/2024   LDLCALC 166 (H) 08/17/2024   LDLDIRECT 150.0 12/31/2021   TRIG 150.0 (H) 08/17/2024   CHOLHDL 5 08/17/2024     Orders: -     CBC with Differential/Platelet -     Lipid panel -     Hepatic function panel  Type 2 diabetes mellitus with hyperglycemia, without long-term current use of insulin (HCC) Assessment & Plan: Currently on glipizide . Low carb diet and exercise. Follow met b and A1c.   Orders: -     Hemoglobin A1c -     Hepatic function panel -     Basic metabolic panel with GFR  Primary hypertension Assessment & Plan: Currently on amlodpine 2.5mg  q day, benazepril   and  hydrochlorothiazide  25mg  q day. Blood pressure as outlined. Follow pressures. No change in medication today.   Orders: -     Basic metabolic panel with GFR  Visit for screening mammogram -     3D Screening Mammogram, Left and Right; Future  Health care maintenance Assessment & Plan: Physical today 08/17/24.or  Mammogram 06/30/23 - Birads I.r  Colonoscopy 06/2016 - tubular adenoma x 2 and hyperplastic polyp x 2.  Recommended f/u colonoscopy in 5 years. Order placed for f/u mammogram she wants to schedule - due to needing to get a ride.    Gastroesophageal reflux disease, unspecified whether esophagitis present Assessment & Plan: No upper symptoms reported. Continues on prilosec .    History of colonic polyps Assessment & Plan: Colonoscopy 12.2017 - one polyp in ascending colon and two rectal polyps (tubular adenomas).  recommended f/u in 5 years.  Discussed colonoscopy, age. Saw GI. Recommended no follow up colonoscopy.    Pain in  both knees, unspecified chronicity Assessment & Plan: Dr Edie - 01/2024 - bilateral knee pain. S/p left and right knee injections. Did not tolerate steroid injection. They discussed gel injections. Continues to f/u with ortho.    Statin myopathy Assessment & Plan: Intolerant to statin medication.    Other orders -     amLODIPine  Besylate; Take 1 tablet (2.5 mg total) by mouth daily.  Dispense: 90 tablet; Refill: 1 -     glipiZIDE  ER; Take 1 tablet (5 mg total) by mouth daily with breakfast.  Dispense: 90 tablet; Refill: 1 -     hydroCHLOROthiazide ; Take 1 tablet (25 mg total) by mouth daily.  Dispense: 90 tablet; Refill: 1 -     Albuterol  Sulfate HFA; Inhale 2 puffs into the lungs every 6 (six) hours as needed for wheezing or shortness of breath.  Dispense: 18 g; Refill: 1     Allena Hamilton, MD "

## 2024-08-17 NOTE — Assessment & Plan Note (Addendum)
 Physical today 08/17/24.or  Mammogram 06/30/23 - Birads I.r  Colonoscopy 06/2016 - tubular adenoma x 2 and hyperplastic polyp x 2.  Recommended f/u colonoscopy in 5 years. Order placed for f/u mammogram she wants to schedule - due to needing to get a ride.

## 2024-08-18 ENCOUNTER — Ambulatory Visit: Payer: Self-pay | Admitting: Internal Medicine

## 2024-08-21 ENCOUNTER — Encounter: Payer: Self-pay | Admitting: Internal Medicine

## 2024-08-21 NOTE — Assessment & Plan Note (Signed)
 Dr Edie - 01/2024 - bilateral knee pain. S/p left and right knee injections. Did not tolerate steroid injection. They discussed gel injections. Continues to f/u with ortho.

## 2024-08-21 NOTE — Assessment & Plan Note (Signed)
 Colonoscopy 12.2017 - one polyp in ascending colon and two rectal polyps (tubular adenomas).  recommended f/u in 5 years.  Discussed colonoscopy, age. Saw GI. Recommended no follow up colonoscopy.

## 2024-08-21 NOTE — Assessment & Plan Note (Signed)
 Currently on amlodpine 2.5mg  q day, benazepril   and hydrochlorothiazide  25mg  q day. Blood pressure as outlined. Follow pressures. No change in medication today.

## 2024-08-21 NOTE — Assessment & Plan Note (Signed)
 No upper symptoms reported. Continues on prilosec.

## 2024-08-21 NOTE — Assessment & Plan Note (Signed)
 Intolerant to statins.  Off repatha .  Did not tolerate zetia . On niacin. Low cholesterol diet and exercise.  Follow lipid panel.  Lab Results  Component Value Date   CHOL 252 (H) 08/17/2024   HDL 55.70 08/17/2024   LDLCALC 166 (H) 08/17/2024   LDLDIRECT 150.0 12/31/2021   TRIG 150.0 (H) 08/17/2024   CHOLHDL 5 08/17/2024

## 2024-08-21 NOTE — Assessment & Plan Note (Signed)
Intolerant to statin medication.

## 2024-08-21 NOTE — Assessment & Plan Note (Signed)
 Currently on glipizide . Low carb diet and exercise. Follow met b and A1c.

## 2024-12-19 ENCOUNTER — Ambulatory Visit: Admitting: Internal Medicine

## 2025-02-20 ENCOUNTER — Encounter: Admitting: Dermatology

## 2025-07-10 ENCOUNTER — Ambulatory Visit
